# Patient Record
Sex: Female | Born: 1965
Health system: Southern US, Community
[De-identification: ages and names within clinical notes are randomized; demographics above are authoritative.]

## PROBLEM LIST (undated history)

## (undated) ENCOUNTER — Ambulatory Visit (HOSPITAL_COMMUNITY): Payer: 59

## (undated) DIAGNOSIS — R739 Hyperglycemia, unspecified: Secondary | ICD-10-CM

## (undated) DIAGNOSIS — K7460 Unspecified cirrhosis of liver: Secondary | ICD-10-CM

## (undated) DIAGNOSIS — I10 Essential (primary) hypertension: Secondary | ICD-10-CM

## (undated) DIAGNOSIS — R42 Dizziness and giddiness: Secondary | ICD-10-CM

## (undated) DIAGNOSIS — G629 Polyneuropathy, unspecified: Secondary | ICD-10-CM

## (undated) DIAGNOSIS — Z828 Family history of other disabilities and chronic diseases leading to disablement, not elsewhere classified: Secondary | ICD-10-CM

## (undated) DIAGNOSIS — N63 Unspecified lump in unspecified breast: Secondary | ICD-10-CM

## (undated) DIAGNOSIS — M1990 Unspecified osteoarthritis, unspecified site: Secondary | ICD-10-CM

## (undated) DIAGNOSIS — F32A Depression, unspecified: Secondary | ICD-10-CM

## (undated) DIAGNOSIS — F410 Panic disorder [episodic paroxysmal anxiety] without agoraphobia: Secondary | ICD-10-CM

## (undated) DIAGNOSIS — F319 Bipolar disorder, unspecified: Secondary | ICD-10-CM

## (undated) DIAGNOSIS — Z8489 Family history of other specified conditions: Secondary | ICD-10-CM

## (undated) DIAGNOSIS — E7800 Pure hypercholesterolemia, unspecified: Secondary | ICD-10-CM

## (undated) DIAGNOSIS — R918 Other nonspecific abnormal finding of lung field: Secondary | ICD-10-CM

## (undated) DIAGNOSIS — K754 Autoimmune hepatitis: Secondary | ICD-10-CM

## (undated) DIAGNOSIS — F329 Major depressive disorder, single episode, unspecified: Secondary | ICD-10-CM

## (undated) DIAGNOSIS — F419 Anxiety disorder, unspecified: Secondary | ICD-10-CM

## (undated) DIAGNOSIS — B1910 Unspecified viral hepatitis B without hepatic coma: Secondary | ICD-10-CM

## (undated) DIAGNOSIS — N898 Other specified noninflammatory disorders of vagina: Secondary | ICD-10-CM

## (undated) HISTORY — DX: Dizziness and giddiness: R42

## (undated) HISTORY — PX: TUBAL LIGATION: SHX77

## (undated) HISTORY — DX: Autoimmune hepatitis: K75.4

## (undated) HISTORY — DX: Hyperglycemia, unspecified: R73.9

## (undated) HISTORY — DX: Other specified noninflammatory disorders of vagina: N89.8

## (undated) HISTORY — DX: Unspecified cirrhosis of liver: K74.60

## (undated) HISTORY — DX: Panic disorder (episodic paroxysmal anxiety): F41.0

## (undated) HISTORY — PX: OTHER SURGICAL HISTORY: SHX169

## (undated) HISTORY — PX: LAPAROSCOPY: SHX197

---

## 1997-12-07 ENCOUNTER — Other Ambulatory Visit: Admission: RE | Admit: 1997-12-07 | Discharge: 1997-12-07 | Payer: Self-pay | Admitting: Family Medicine

## 2003-12-05 ENCOUNTER — Emergency Department (HOSPITAL_COMMUNITY): Admission: EM | Admit: 2003-12-05 | Discharge: 2003-12-05 | Payer: Self-pay | Admitting: Emergency Medicine

## 2004-01-17 ENCOUNTER — Emergency Department (HOSPITAL_COMMUNITY): Admission: EM | Admit: 2004-01-17 | Discharge: 2004-01-17 | Payer: Self-pay | Admitting: Emergency Medicine

## 2004-03-13 ENCOUNTER — Emergency Department (HOSPITAL_COMMUNITY): Admission: EM | Admit: 2004-03-13 | Discharge: 2004-03-13 | Payer: Self-pay | Admitting: Family Medicine

## 2004-06-05 ENCOUNTER — Emergency Department (HOSPITAL_COMMUNITY): Admission: EM | Admit: 2004-06-05 | Discharge: 2004-06-05 | Payer: Self-pay | Admitting: *Deleted

## 2005-04-04 ENCOUNTER — Emergency Department (HOSPITAL_COMMUNITY): Admission: EM | Admit: 2005-04-04 | Discharge: 2005-04-04 | Payer: Self-pay | Admitting: Family Medicine

## 2005-06-07 ENCOUNTER — Emergency Department (HOSPITAL_COMMUNITY): Admission: EM | Admit: 2005-06-07 | Discharge: 2005-06-07 | Payer: Self-pay | Admitting: Family Medicine

## 2005-06-20 ENCOUNTER — Emergency Department (HOSPITAL_COMMUNITY): Admission: EM | Admit: 2005-06-20 | Discharge: 2005-06-21 | Payer: Self-pay | Admitting: Emergency Medicine

## 2005-06-22 ENCOUNTER — Ambulatory Visit (HOSPITAL_COMMUNITY): Admission: RE | Admit: 2005-06-22 | Discharge: 2005-06-22 | Payer: Self-pay | Admitting: Emergency Medicine

## 2005-10-07 ENCOUNTER — Emergency Department (HOSPITAL_COMMUNITY): Admission: EM | Admit: 2005-10-07 | Discharge: 2005-10-07 | Payer: Self-pay | Admitting: Emergency Medicine

## 2005-12-18 ENCOUNTER — Emergency Department (HOSPITAL_COMMUNITY): Admission: EM | Admit: 2005-12-18 | Discharge: 2005-12-18 | Payer: Self-pay | Admitting: Emergency Medicine

## 2006-02-19 ENCOUNTER — Emergency Department (HOSPITAL_COMMUNITY): Admission: EM | Admit: 2006-02-19 | Discharge: 2006-02-19 | Payer: Self-pay | Admitting: *Deleted

## 2006-03-15 ENCOUNTER — Emergency Department (HOSPITAL_COMMUNITY): Admission: EM | Admit: 2006-03-15 | Discharge: 2006-03-16 | Payer: Self-pay | Admitting: Emergency Medicine

## 2006-03-19 ENCOUNTER — Inpatient Hospital Stay (HOSPITAL_COMMUNITY): Admission: AD | Admit: 2006-03-19 | Discharge: 2006-03-19 | Payer: Self-pay | Admitting: Obstetrics & Gynecology

## 2007-05-17 ENCOUNTER — Emergency Department (HOSPITAL_COMMUNITY): Admission: EM | Admit: 2007-05-17 | Discharge: 2007-05-17 | Payer: Self-pay | Admitting: Family Medicine

## 2007-08-13 ENCOUNTER — Emergency Department (HOSPITAL_COMMUNITY): Admission: EM | Admit: 2007-08-13 | Discharge: 2007-08-13 | Payer: Self-pay | Admitting: Emergency Medicine

## 2008-02-17 ENCOUNTER — Emergency Department (HOSPITAL_COMMUNITY): Admission: EM | Admit: 2008-02-17 | Discharge: 2008-02-17 | Payer: Self-pay | Admitting: Family Medicine

## 2008-04-30 ENCOUNTER — Inpatient Hospital Stay (HOSPITAL_COMMUNITY): Admission: RE | Admit: 2008-04-30 | Discharge: 2008-04-30 | Payer: Self-pay | Admitting: Gynecology

## 2008-12-01 ENCOUNTER — Emergency Department (HOSPITAL_COMMUNITY): Admission: EM | Admit: 2008-12-01 | Discharge: 2008-12-01 | Payer: Self-pay | Admitting: Emergency Medicine

## 2008-12-22 ENCOUNTER — Emergency Department (HOSPITAL_COMMUNITY): Admission: EM | Admit: 2008-12-22 | Discharge: 2008-12-22 | Payer: Self-pay | Admitting: Emergency Medicine

## 2009-04-21 ENCOUNTER — Inpatient Hospital Stay (HOSPITAL_COMMUNITY): Admission: AD | Admit: 2009-04-21 | Discharge: 2009-04-21 | Payer: Self-pay | Admitting: Family Medicine

## 2009-09-03 ENCOUNTER — Emergency Department (HOSPITAL_COMMUNITY): Admission: EM | Admit: 2009-09-03 | Discharge: 2009-09-03 | Payer: Self-pay | Admitting: Emergency Medicine

## 2009-09-12 ENCOUNTER — Emergency Department (HOSPITAL_COMMUNITY): Admission: EM | Admit: 2009-09-12 | Discharge: 2009-09-12 | Payer: Self-pay | Admitting: Emergency Medicine

## 2009-09-16 ENCOUNTER — Emergency Department (HOSPITAL_COMMUNITY): Admission: EM | Admit: 2009-09-16 | Discharge: 2009-09-16 | Payer: Self-pay | Admitting: Family Medicine

## 2009-09-30 ENCOUNTER — Emergency Department (HOSPITAL_COMMUNITY): Admission: EM | Admit: 2009-09-30 | Discharge: 2009-10-01 | Payer: Self-pay | Admitting: Emergency Medicine

## 2009-10-08 ENCOUNTER — Emergency Department (HOSPITAL_COMMUNITY): Admission: EM | Admit: 2009-10-08 | Discharge: 2009-10-08 | Payer: Self-pay | Admitting: Emergency Medicine

## 2009-10-23 ENCOUNTER — Emergency Department (HOSPITAL_COMMUNITY): Admission: EM | Admit: 2009-10-23 | Discharge: 2009-10-23 | Payer: Self-pay | Admitting: Emergency Medicine

## 2009-11-21 ENCOUNTER — Emergency Department (HOSPITAL_COMMUNITY): Admission: EM | Admit: 2009-11-21 | Discharge: 2009-11-21 | Payer: Self-pay | Admitting: Emergency Medicine

## 2009-11-23 ENCOUNTER — Emergency Department (HOSPITAL_COMMUNITY): Admission: EM | Admit: 2009-11-23 | Discharge: 2009-11-23 | Payer: Self-pay | Admitting: Emergency Medicine

## 2009-11-30 ENCOUNTER — Inpatient Hospital Stay (HOSPITAL_COMMUNITY): Admission: EM | Admit: 2009-11-30 | Discharge: 2009-12-04 | Payer: Self-pay | Admitting: Emergency Medicine

## 2009-12-17 ENCOUNTER — Ambulatory Visit (HOSPITAL_COMMUNITY): Admission: RE | Admit: 2009-12-17 | Discharge: 2009-12-17 | Payer: Self-pay | Admitting: Gastroenterology

## 2009-12-30 ENCOUNTER — Emergency Department (HOSPITAL_COMMUNITY): Admission: EM | Admit: 2009-12-30 | Discharge: 2009-12-30 | Payer: Self-pay | Admitting: Emergency Medicine

## 2010-01-01 ENCOUNTER — Observation Stay (HOSPITAL_COMMUNITY): Admission: EM | Admit: 2010-01-01 | Discharge: 2010-01-02 | Payer: Self-pay | Admitting: Emergency Medicine

## 2010-01-27 ENCOUNTER — Emergency Department (HOSPITAL_COMMUNITY): Admission: EM | Admit: 2010-01-27 | Discharge: 2010-01-27 | Payer: Self-pay | Admitting: Family Medicine

## 2010-02-22 ENCOUNTER — Emergency Department (HOSPITAL_COMMUNITY): Admission: EM | Admit: 2010-02-22 | Discharge: 2010-02-22 | Payer: Self-pay | Admitting: Family Medicine

## 2010-04-03 ENCOUNTER — Emergency Department (HOSPITAL_COMMUNITY): Admission: EM | Admit: 2010-04-03 | Discharge: 2010-04-03 | Payer: Self-pay | Admitting: Emergency Medicine

## 2010-04-16 ENCOUNTER — Emergency Department (HOSPITAL_COMMUNITY): Admission: EM | Admit: 2010-04-16 | Discharge: 2010-04-16 | Payer: Self-pay | Admitting: Family Medicine

## 2010-05-10 ENCOUNTER — Emergency Department (HOSPITAL_COMMUNITY): Admission: EM | Admit: 2010-05-10 | Discharge: 2010-05-10 | Payer: Self-pay | Admitting: Family Medicine

## 2010-07-10 ENCOUNTER — Emergency Department (HOSPITAL_COMMUNITY)
Admission: EM | Admit: 2010-07-10 | Discharge: 2010-07-10 | Payer: Self-pay | Source: Home / Self Care | Admitting: Emergency Medicine

## 2010-08-17 ENCOUNTER — Emergency Department (HOSPITAL_COMMUNITY)
Admission: EM | Admit: 2010-08-17 | Discharge: 2010-08-17 | Payer: Self-pay | Source: Home / Self Care | Admitting: Emergency Medicine

## 2010-08-17 LAB — CBC
HCT: 38 % (ref 36.0–46.0)
Hemoglobin: 12.7 g/dL (ref 12.0–15.0)
MCH: 33.7 pg (ref 26.0–34.0)
MCHC: 33.4 g/dL (ref 30.0–36.0)
MCV: 100.8 fL — ABNORMAL HIGH (ref 78.0–100.0)
Platelets: 363 10*3/uL (ref 150–400)
RBC: 3.77 MIL/uL — ABNORMAL LOW (ref 3.87–5.11)
RDW: 14.5 % (ref 11.5–15.5)
WBC: 14.5 10*3/uL — ABNORMAL HIGH (ref 4.0–10.5)

## 2010-08-17 LAB — WET PREP, GENITAL
Trich, Wet Prep: NONE SEEN
Yeast Wet Prep HPF POC: NONE SEEN

## 2010-08-17 LAB — URINALYSIS, ROUTINE W REFLEX MICROSCOPIC
Bilirubin Urine: NEGATIVE
Hemoglobin, Urine: NEGATIVE
Ketones, ur: NEGATIVE mg/dL
Nitrite: NEGATIVE
Protein, ur: NEGATIVE mg/dL
Specific Gravity, Urine: 1.017 (ref 1.005–1.030)
Urine Glucose, Fasting: 250 mg/dL — AB
Urobilinogen, UA: 1 mg/dL (ref 0.0–1.0)
pH: 5.5 (ref 5.0–8.0)

## 2010-08-17 LAB — DIFFERENTIAL
Basophils Absolute: 0.1 10*3/uL (ref 0.0–0.1)
Basophils Relative: 1 % (ref 0–1)
Eosinophils Absolute: 0.4 10*3/uL (ref 0.0–0.7)
Eosinophils Relative: 3 % (ref 0–5)
Lymphocytes Relative: 23 % (ref 12–46)
Lymphs Abs: 3.3 10*3/uL (ref 0.7–4.0)
Monocytes Absolute: 1.1 10*3/uL — ABNORMAL HIGH (ref 0.1–1.0)
Monocytes Relative: 7 % (ref 3–12)
Neutro Abs: 9.7 10*3/uL — ABNORMAL HIGH (ref 1.7–7.7)
Neutrophils Relative %: 67 % (ref 43–77)

## 2010-08-17 LAB — POCT PREGNANCY, URINE: Preg Test, Ur: NEGATIVE

## 2010-08-18 ENCOUNTER — Emergency Department (HOSPITAL_COMMUNITY)
Admission: EM | Admit: 2010-08-18 | Discharge: 2010-08-18 | Payer: Self-pay | Source: Home / Self Care | Admitting: Emergency Medicine

## 2010-08-18 LAB — RPR: RPR Ser Ql: NONREACTIVE

## 2010-08-19 LAB — GC/CHLAMYDIA PROBE AMP, GENITAL
Chlamydia, DNA Probe: NEGATIVE
GC Probe Amp, Genital: NEGATIVE

## 2010-09-04 ENCOUNTER — Encounter: Payer: Self-pay | Admitting: Family Medicine

## 2010-09-13 ENCOUNTER — Emergency Department (HOSPITAL_COMMUNITY)
Admission: EM | Admit: 2010-09-13 | Discharge: 2010-09-13 | Payer: Self-pay | Source: Home / Self Care | Admitting: Family Medicine

## 2010-09-13 ENCOUNTER — Other Ambulatory Visit: Payer: Self-pay | Admitting: Family Medicine

## 2010-09-13 DIAGNOSIS — N63 Unspecified lump in unspecified breast: Secondary | ICD-10-CM

## 2010-09-13 LAB — POCT I-STAT, CHEM 8
BUN: 11 mg/dL (ref 6–23)
Creatinine, Ser: 0.7 mg/dL (ref 0.4–1.2)
Glucose, Bld: 90 mg/dL (ref 70–99)
Potassium: 3.4 mEq/L — ABNORMAL LOW (ref 3.5–5.1)
Sodium: 140 mEq/L (ref 135–145)
TCO2: 29 mmol/L (ref 0–100)

## 2010-09-13 LAB — POCT URINALYSIS DIPSTICK
Hgb urine dipstick: NEGATIVE
Ketones, ur: NEGATIVE mg/dL
Protein, ur: NEGATIVE mg/dL
Specific Gravity, Urine: 1.02 (ref 1.005–1.030)

## 2010-09-13 LAB — WET PREP, GENITAL
Clue Cells Wet Prep HPF POC: NONE SEEN
Trich, Wet Prep: NONE SEEN

## 2010-09-14 LAB — GC/CHLAMYDIA PROBE AMP, GENITAL
Chlamydia, DNA Probe: NEGATIVE
GC Probe Amp, Genital: NEGATIVE

## 2010-09-20 ENCOUNTER — Ambulatory Visit
Admission: RE | Admit: 2010-09-20 | Discharge: 2010-09-20 | Disposition: A | Discharge status: Home / Self Care | Payer: Medicaid Other | Source: Ambulatory Visit | Attending: Family Medicine | Admitting: Family Medicine

## 2010-09-20 ENCOUNTER — Other Ambulatory Visit: Payer: Self-pay | Admitting: Family Medicine

## 2010-09-20 DIAGNOSIS — N63 Unspecified lump in unspecified breast: Secondary | ICD-10-CM

## 2010-10-25 LAB — COMPREHENSIVE METABOLIC PANEL
ALT: 525 U/L — ABNORMAL HIGH (ref 0–35)
AST: 726 U/L — ABNORMAL HIGH (ref 0–37)
CO2: 27 mEq/L (ref 19–32)
Calcium: 9.5 mg/dL (ref 8.4–10.5)
Chloride: 104 mEq/L (ref 96–112)
GFR calc Af Amer: >60 mL/min (ref >60)
GFR calc non Af Amer: >60 mL/min (ref >60)
Potassium: 4.6 mEq/L (ref 3.5–5.1)
Sodium: 139 mEq/L (ref 135–145)
Total Bilirubin: 0.8 mg/dL (ref 0.3–1.2)

## 2010-10-25 LAB — LIPASE, BLOOD: Lipase: 29 U/L (ref 11–59)

## 2010-10-25 LAB — WET PREP, GENITAL
Clue Cells Wet Prep HPF POC: NONE SEEN
WBC, Wet Prep HPF POC: NONE SEEN

## 2010-10-25 LAB — DIFFERENTIAL
Eosinophils Absolute: 0.2 10*3/uL (ref 0.0–0.7)
Eosinophils Relative: 1 % (ref 0–5)
Lymphs Abs: 2.2 10*3/uL (ref 0.7–4.0)

## 2010-10-25 LAB — URINE MICROSCOPIC-ADD ON

## 2010-10-25 LAB — URINALYSIS, ROUTINE W REFLEX MICROSCOPIC
Glucose, UA: NEGATIVE mg/dL
Hgb urine dipstick: NEGATIVE
Specific Gravity, Urine: 1.018 (ref 1.005–1.030)

## 2010-10-25 LAB — GC/CHLAMYDIA PROBE AMP, GENITAL: GC Probe Amp, Genital: NEGATIVE

## 2010-10-25 LAB — CBC
Hemoglobin: 12.7 g/dL (ref 12.0–15.0)
RBC: 3.88 MIL/uL (ref 3.87–5.11)
WBC: 11.8 10*3/uL — ABNORMAL HIGH (ref 4.0–10.5)

## 2010-10-27 LAB — URINE CULTURE

## 2010-10-27 LAB — BASIC METABOLIC PANEL
BUN: 9 mg/dL (ref 6–23)
CO2: 24 mEq/L (ref 19–32)
Calcium: 8.7 mg/dL (ref 8.4–10.5)
Creatinine, Ser: 0.55 mg/dL (ref 0.4–1.2)
Glucose, Bld: 82 mg/dL (ref 70–99)

## 2010-10-27 LAB — DIFFERENTIAL
Basophils Relative: 0 % (ref 0–1)
Eosinophils Absolute: 0.1 10*3/uL (ref 0.0–0.7)
Monocytes Relative: 7 % (ref 3–12)
Neutrophils Relative %: 70 % (ref 43–77)

## 2010-10-27 LAB — WET PREP, GENITAL
Trich, Wet Prep: NONE SEEN
Trich, Wet Prep: NONE SEEN
WBC, Wet Prep HPF POC: NONE SEEN
Yeast Wet Prep HPF POC: NONE SEEN
Yeast Wet Prep HPF POC: NONE SEEN

## 2010-10-27 LAB — LIPASE, BLOOD: Lipase: 25 U/L (ref 11–59)

## 2010-10-27 LAB — POCT URINALYSIS DIPSTICK: pH: 6 (ref 5.0–8.0)

## 2010-10-27 LAB — URINALYSIS, ROUTINE W REFLEX MICROSCOPIC
Ketones, ur: NEGATIVE mg/dL
Nitrite: NEGATIVE
Protein, ur: NEGATIVE mg/dL
pH: 6 (ref 5.0–8.0)

## 2010-10-27 LAB — HEPATIC FUNCTION PANEL
Albumin: 3.3 g/dL — ABNORMAL LOW (ref 3.5–5.2)
Total Bilirubin: 0.7 mg/dL (ref 0.3–1.2)

## 2010-10-27 LAB — CBC
Hemoglobin: 12.6 g/dL (ref 12.0–15.0)
MCH: 29.4 pg (ref 26.0–34.0)
MCHC: 33.3 g/dL (ref 30.0–36.0)
Platelets: 421 10*3/uL — ABNORMAL HIGH (ref 150–400)
RDW: 18.7 % — ABNORMAL HIGH (ref 11.5–15.5)

## 2010-10-27 LAB — POCT PREGNANCY, URINE: Preg Test, Ur: NEGATIVE

## 2010-10-27 LAB — GC/CHLAMYDIA PROBE AMP, GENITAL: GC Probe Amp, Genital: NEGATIVE

## 2010-10-30 LAB — URINALYSIS, ROUTINE W REFLEX MICROSCOPIC
Ketones, ur: NEGATIVE mg/dL
Nitrite: NEGATIVE
Protein, ur: NEGATIVE mg/dL
Urobilinogen, UA: 1 mg/dL (ref 0.0–1.0)

## 2010-10-30 LAB — COMPREHENSIVE METABOLIC PANEL
ALT: 438 U/L — ABNORMAL HIGH (ref 0–35)
AST: 388 U/L — ABNORMAL HIGH (ref 0–37)
Calcium: 9.2 mg/dL (ref 8.4–10.5)
Creatinine, Ser: 0.67 mg/dL (ref 0.4–1.2)
GFR calc Af Amer: >60 mL/min (ref >60)
Sodium: 142 mEq/L (ref 135–145)
Total Protein: 8.2 g/dL (ref 6.0–8.3)

## 2010-10-30 LAB — POCT I-STAT, CHEM 8
Calcium, Ion: 1.17 mmol/L (ref 1.12–1.32)
Chloride: 106 mEq/L (ref 96–112)
HCT: 39 % (ref 36.0–46.0)
Hemoglobin: 13.3 g/dL (ref 12.0–15.0)
TCO2: 24 mmol/L (ref 0–100)

## 2010-10-30 LAB — WET PREP, GENITAL
Trich, Wet Prep: NONE SEEN
Yeast Wet Prep HPF POC: NONE SEEN

## 2010-10-30 LAB — DIFFERENTIAL
Eosinophils Absolute: 0.3 10*3/uL (ref 0.0–0.7)
Lymphocytes Relative: 28 % (ref 12–46)
Lymphs Abs: 2.3 10*3/uL (ref 0.7–4.0)
Monocytes Relative: 8 % (ref 3–12)
Neutrophils Relative %: 60 % (ref 43–77)

## 2010-10-30 LAB — GC/CHLAMYDIA PROBE AMP, GENITAL: GC Probe Amp, Genital: NEGATIVE

## 2010-10-30 LAB — CBC
MCHC: 33.5 g/dL (ref 30.0–36.0)
RDW: 19.6 % — ABNORMAL HIGH (ref 11.5–15.5)

## 2010-10-30 LAB — GLUCOSE, CAPILLARY: Glucose-Capillary: 145 mg/dL — ABNORMAL HIGH (ref 70–99)

## 2010-10-31 LAB — URINALYSIS, ROUTINE W REFLEX MICROSCOPIC
Glucose, UA: >1000 mg/dL — AB
Ketones, ur: NEGATIVE mg/dL
Leukocytes, UA: NEGATIVE
pH: 6.5 (ref 5.0–8.0)

## 2010-10-31 LAB — DIFFERENTIAL
Basophils Absolute: 0 10*3/uL (ref 0.0–0.1)
Eosinophils Relative: 0 % (ref 0–5)
Lymphocytes Relative: 12 % (ref 12–46)
Monocytes Absolute: 1.2 10*3/uL — ABNORMAL HIGH (ref 0.1–1.0)
Monocytes Relative: 7 % (ref 3–12)
Neutro Abs: 15.1 10*3/uL — ABNORMAL HIGH (ref 1.7–7.7)

## 2010-10-31 LAB — COMPREHENSIVE METABOLIC PANEL
ALT: 357 U/L — ABNORMAL HIGH (ref 0–35)
AST: 174 U/L — ABNORMAL HIGH (ref 0–37)
Albumin: 3 g/dL — ABNORMAL LOW (ref 3.5–5.2)
Alkaline Phosphatase: 88 U/L (ref 39–117)
BUN: 11 mg/dL (ref 6–23)
Calcium: 8.6 mg/dL (ref 8.4–10.5)
Calcium: 9.5 mg/dL (ref 8.4–10.5)
Creatinine, Ser: 0.43 mg/dL (ref 0.4–1.2)
GFR calc Af Amer: >60 mL/min (ref >60)
Glucose, Bld: 197 mg/dL — ABNORMAL HIGH (ref 70–99)
Glucose, Bld: 420 mg/dL — ABNORMAL HIGH (ref 70–99)
Potassium: 4.9 mEq/L (ref 3.5–5.1)
Sodium: 136 mEq/L (ref 135–145)
Total Protein: 6.3 g/dL (ref 6.0–8.3)
Total Protein: 7.6 g/dL (ref 6.0–8.3)

## 2010-10-31 LAB — CBC
HCT: 30.8 % — ABNORMAL LOW (ref 36.0–46.0)
HCT: 34.9 % — ABNORMAL LOW (ref 36.0–46.0)
Hemoglobin: 10.5 g/dL — ABNORMAL LOW (ref 12.0–15.0)
Hemoglobin: 12.1 g/dL (ref 12.0–15.0)
MCHC: 34 g/dL (ref 30.0–36.0)
RBC: 3.82 MIL/uL — ABNORMAL LOW (ref 3.87–5.11)
RDW: 17.4 % — ABNORMAL HIGH (ref 11.5–15.5)
RDW: 17.5 % — ABNORMAL HIGH (ref 11.5–15.5)

## 2010-10-31 LAB — HEMOGLOBIN A1C: Hgb A1c MFr Bld: 6.4 % — ABNORMAL HIGH (ref <5.7)

## 2010-10-31 LAB — POCT I-STAT, CHEM 8
Calcium, Ion: 1.18 mmol/L (ref 1.12–1.32)
Glucose, Bld: 530 mg/dL — ABNORMAL HIGH (ref 70–99)
HCT: 39 % (ref 36.0–46.0)
Hemoglobin: 13.3 g/dL (ref 12.0–15.0)
TCO2: 26 mmol/L (ref 0–100)

## 2010-10-31 LAB — POCT URINALYSIS DIP (DEVICE)
Protein, ur: NEGATIVE mg/dL
Specific Gravity, Urine: >=1.03 (ref 1.005–1.030)
Urobilinogen, UA: 4 mg/dL — ABNORMAL HIGH (ref 0.0–1.0)
pH: 5.5 (ref 5.0–8.0)

## 2010-10-31 LAB — GLUCOSE, CAPILLARY
Glucose-Capillary: 265 mg/dL — ABNORMAL HIGH (ref 70–99)
Glucose-Capillary: 277 mg/dL — ABNORMAL HIGH (ref 70–99)
Glucose-Capillary: 438 mg/dL — ABNORMAL HIGH (ref 70–99)
Glucose-Capillary: 453 mg/dL — ABNORMAL HIGH (ref 70–99)
Glucose-Capillary: 528 mg/dL — ABNORMAL HIGH (ref 70–99)

## 2010-10-31 LAB — POCT CARDIAC MARKERS: Myoglobin, poc: 57.3 ng/mL (ref 12–200)

## 2010-10-31 LAB — HEPATIC FUNCTION PANEL
ALT: 274 U/L — ABNORMAL HIGH (ref 0–35)
AST: 128 U/L — ABNORMAL HIGH (ref 0–37)
Alkaline Phosphatase: 90 U/L (ref 39–117)
Bilirubin, Direct: 1.6 mg/dL — ABNORMAL HIGH (ref 0.0–0.3)
Indirect Bilirubin: 1.7 mg/dL — ABNORMAL HIGH (ref 0.3–0.9)
Total Bilirubin: 3.3 mg/dL — ABNORMAL HIGH (ref 0.3–1.2)

## 2010-10-31 LAB — POCT I-STAT 3, VENOUS BLOOD GAS (G3P V)
Acid-Base Excess: 2 mmol/L (ref 0.0–2.0)
O2 Saturation: 49 %
pO2, Ven: 27 mmHg — CL (ref 30.0–45.0)

## 2010-10-31 LAB — LIPID PANEL
Cholesterol: 235 mg/dL — ABNORMAL HIGH (ref 0–200)
HDL: 39 mg/dL — ABNORMAL LOW (ref >39)
LDL Cholesterol: 142 mg/dL — ABNORMAL HIGH (ref 0–99)
Total CHOL/HDL Ratio: 6 RATIO
Triglycerides: 268 mg/dL — ABNORMAL HIGH (ref <150)

## 2010-10-31 LAB — WET PREP, GENITAL: Yeast Wet Prep HPF POC: NONE SEEN

## 2010-10-31 LAB — URINE MICROSCOPIC-ADD ON

## 2010-11-01 LAB — CBC
HCT: 36 % (ref 36.0–46.0)
HCT: 31.7 % — ABNORMAL LOW (ref 36.0–46.0)
HCT: 32.7 % — ABNORMAL LOW (ref 36.0–46.0)
HCT: 33.1 % — ABNORMAL LOW (ref 36.0–46.0)
Hemoglobin: 12.2 g/dL (ref 12.0–15.0)
Hemoglobin: 10.8 g/dL — ABNORMAL LOW (ref 12.0–15.0)
Hemoglobin: 11.5 g/dL — ABNORMAL LOW (ref 12.0–15.0)
MCHC: 35.1 g/dL (ref 30.0–36.0)
MCHC: 35.6 g/dL (ref 30.0–36.0)
MCV: 88.8 fL (ref 78.0–100.0)
Platelets: 598 10*3/uL — ABNORMAL HIGH (ref 150–400)
Platelets: 526 10*3/uL — ABNORMAL HIGH (ref 150–400)
RBC: 3.41 MIL/uL — ABNORMAL LOW (ref 3.87–5.11)
RBC: 3.58 MIL/uL — ABNORMAL LOW (ref 3.87–5.11)
RDW: 21.6 % — ABNORMAL HIGH (ref 11.5–15.5)
RDW: 22.3 % — ABNORMAL HIGH (ref 11.5–15.5)
WBC: 15.1 10*3/uL — ABNORMAL HIGH (ref 4.0–10.5)
WBC: 10.5 10*3/uL (ref 4.0–10.5)
WBC: 9.5 10*3/uL (ref 4.0–10.5)
WBC: 9.8 10*3/uL (ref 4.0–10.5)

## 2010-11-01 LAB — DIFFERENTIAL
Blasts: 0 %
Lymphocytes Relative: 20 % (ref 12–46)
Lymphs Abs: 2 10*3/uL (ref 0.7–4.0)
Neutro Abs: 6.2 10*3/uL (ref 1.7–7.7)
Neutrophils Relative %: 62 % (ref 43–77)
Promyelocytes Absolute: 0 %

## 2010-11-01 LAB — PROTEIN ELECTROPH W RFLX QUANT IMMUNOGLOBULINS
Alpha-1-Globulin: 15.1 % — ABNORMAL HIGH (ref 2.9–4.9)
Gamma Globulin: 22.4 % — ABNORMAL HIGH (ref 11.1–18.8)

## 2010-11-01 LAB — COMPREHENSIVE METABOLIC PANEL
ALT: 611 U/L — ABNORMAL HIGH (ref 0–35)
ALT: 764 U/L — ABNORMAL HIGH (ref 0–35)
Alkaline Phosphatase: 75 U/L (ref 39–117)
Alkaline Phosphatase: 95 U/L (ref 39–117)
BUN: 1 mg/dL — ABNORMAL LOW (ref 6–23)
BUN: 2 mg/dL — ABNORMAL LOW (ref 6–23)
BUN: 4 mg/dL — ABNORMAL LOW (ref 6–23)
CO2: 23 mEq/L (ref 19–32)
CO2: 26 mEq/L (ref 19–32)
Calcium: 8.5 mg/dL (ref 8.4–10.5)
Calcium: 8.7 mg/dL (ref 8.4–10.5)
Calcium: 8.8 mg/dL (ref 8.4–10.5)
Chloride: 102 mEq/L (ref 96–112)
Chloride: 104 mEq/L (ref 96–112)
Creatinine, Ser: 0.5 mg/dL (ref 0.4–1.2)
GFR calc non Af Amer: >60 mL/min (ref >60)
GFR calc non Af Amer: >60 mL/min (ref >60)
GFR calc non Af Amer: >60 mL/min (ref >60)
GFR calc non Af Amer: >60 mL/min (ref >60)
Glucose, Bld: 151 mg/dL — ABNORMAL HIGH (ref 70–99)
Glucose, Bld: 89 mg/dL (ref 70–99)
Glucose, Bld: 96 mg/dL (ref 70–99)
Glucose, Bld: 99 mg/dL (ref 70–99)
Potassium: 3.7 mEq/L (ref 3.5–5.1)
Potassium: 3.8 mEq/L (ref 3.5–5.1)
Sodium: 133 mEq/L — ABNORMAL LOW (ref 135–145)
Sodium: 134 mEq/L — ABNORMAL LOW (ref 135–145)
Total Bilirubin: 6.7 mg/dL — ABNORMAL HIGH (ref 0.3–1.2)
Total Bilirubin: 7.5 mg/dL — ABNORMAL HIGH (ref 0.3–1.2)
Total Protein: 7 g/dL (ref 6.0–8.3)
Total Protein: 7.4 g/dL (ref 6.0–8.3)

## 2010-11-01 LAB — URINALYSIS, ROUTINE W REFLEX MICROSCOPIC
Glucose, UA: NEGATIVE mg/dL
Ketones, ur: NEGATIVE mg/dL
Protein, ur: NEGATIVE mg/dL

## 2010-11-01 LAB — HEPATIC FUNCTION PANEL
ALT: 610 U/L — ABNORMAL HIGH (ref 0–35)
AST: 868 U/L — ABNORMAL HIGH (ref 0–37)
Alkaline Phosphatase: 71 U/L (ref 39–117)
Bilirubin, Direct: 5.1 mg/dL — ABNORMAL HIGH (ref 0.0–0.3)
Indirect Bilirubin: 2.9 mg/dL — ABNORMAL HIGH (ref 0.3–0.9)
Total Protein: 6.7 g/dL (ref 6.0–8.3)

## 2010-11-01 LAB — PROTIME-INR
INR: 0.98 (ref 0.00–1.49)
Prothrombin Time: 12.8 seconds (ref 11.6–15.2)

## 2010-11-01 LAB — URINE MICROSCOPIC-ADD ON

## 2010-11-01 LAB — HEPATITIS B CORE ANTIBODY, TOTAL: Hep B Core Total Ab: NEGATIVE

## 2010-11-01 LAB — ALPHA-1-ANTITRYPSIN: A-1 Antitrypsin, Ser: 223 mg/dL — ABNORMAL HIGH (ref 83–200)

## 2010-11-01 LAB — CYTOMEGALOVIRUS PCR, QUALITATIVE

## 2010-11-01 LAB — APTT
aPTT: 35 seconds (ref 24–37)
aPTT: 34 seconds (ref 24–37)

## 2010-11-01 LAB — IGG, IGA, IGM: IgG (Immunoglobin G), Serum: 1870 mg/dL — ABNORMAL HIGH (ref 694–1618)

## 2010-11-01 LAB — LIPASE, BLOOD: Lipase: 40 U/L (ref 11–59)

## 2010-11-01 LAB — ANTI-SMOOTH MUSCLE ANTIBODY, IGG: F-Actin IgG: <20 U (ref <20)

## 2010-11-01 LAB — CMV ANTIBODY, IGG (EIA): CMV Ab - IgG: 7.6 IU/mL — ABNORMAL HIGH (ref <0.4)

## 2010-11-01 LAB — HCG, SERUM, QUALITATIVE: Preg, Serum: NEGATIVE

## 2010-11-01 LAB — CMV IGM: CMV IgM: <8 AU/mL (ref <30.0)

## 2010-11-01 LAB — HEPATITIS A ANTIBODY, TOTAL: Hep A Total Ab: NEGATIVE

## 2010-11-01 LAB — HEPATITIS B SURFACE ANTIGEN: Hepatitis B Surface Ag: NEGATIVE

## 2010-11-01 LAB — AMMONIA: Ammonia: 51 umol/L — ABNORMAL HIGH (ref 11–35)

## 2010-11-02 LAB — CBC
HCT: 32.6 % — ABNORMAL LOW (ref 36.0–46.0)
Hemoglobin: 11.1 g/dL — ABNORMAL LOW (ref 12.0–15.0)
MCHC: 34 g/dL (ref 30.0–36.0)
Platelets: 482 10*3/uL — ABNORMAL HIGH (ref 150–400)
RDW: 19.2 % — ABNORMAL HIGH (ref 11.5–15.5)

## 2010-11-02 LAB — COMPREHENSIVE METABOLIC PANEL
Albumin: 3.4 g/dL — ABNORMAL LOW (ref 3.5–5.2)
Alkaline Phosphatase: 41 U/L (ref 39–117)
BUN: 15 mg/dL (ref 6–23)
Calcium: 8.8 mg/dL (ref 8.4–10.5)
Glucose, Bld: 88 mg/dL (ref 70–99)
Potassium: 3.8 mEq/L (ref 3.5–5.1)
Total Protein: 7.6 g/dL (ref 6.0–8.3)

## 2010-11-02 LAB — DIFFERENTIAL
Basophils Relative: 0 % (ref 0–1)
Lymphocytes Relative: 21 % (ref 12–46)
Lymphs Abs: 2.4 10*3/uL (ref 0.7–4.0)
Monocytes Absolute: 1.2 10*3/uL — ABNORMAL HIGH (ref 0.1–1.0)
Monocytes Relative: 10 % (ref 3–12)
Neutro Abs: 7.4 10*3/uL (ref 1.7–7.7)
Neutrophils Relative %: 65 % (ref 43–77)

## 2010-11-02 LAB — SAMPLE TO BLOOD BANK

## 2010-11-02 LAB — POCT CARDIAC MARKERS: Myoglobin, poc: 46.6 ng/mL (ref 12–200)

## 2010-11-02 LAB — PROTIME-INR
INR: 1.06 (ref 0.00–1.49)
Prothrombin Time: 13.7 seconds (ref 11.6–15.2)

## 2010-11-06 LAB — POCT I-STAT, CHEM 8
BUN: 6 mg/dL (ref 6–23)
Calcium, Ion: 1.17 mmol/L (ref 1.12–1.32)
Creatinine, Ser: 0.6 mg/dL (ref 0.4–1.2)
Glucose, Bld: 121 mg/dL — ABNORMAL HIGH (ref 70–99)
Hemoglobin: 13.3 g/dL (ref 12.0–15.0)
Sodium: 138 mEq/L (ref 135–145)
TCO2: 28 mmol/L (ref 0–100)

## 2010-11-11 ENCOUNTER — Other Ambulatory Visit: Payer: Self-pay | Admitting: Gastroenterology

## 2010-11-17 ENCOUNTER — Ambulatory Visit
Admission: RE | Admit: 2010-11-17 | Discharge: 2010-11-17 | Disposition: A | Discharge status: Home / Self Care | Payer: Medicaid Other | Source: Ambulatory Visit | Attending: Gastroenterology | Admitting: Gastroenterology

## 2010-11-17 MED ORDER — IOHEXOL 300 MG/ML  SOLN
125.0000 mL | Freq: Once | INTRAMUSCULAR | Status: AC | PRN
  Administered 2010-11-17: 125 mL via INTRAVENOUS

## 2010-11-18 LAB — URINALYSIS, ROUTINE W REFLEX MICROSCOPIC
Bilirubin Urine: NEGATIVE
Glucose, UA: NEGATIVE mg/dL
Hgb urine dipstick: NEGATIVE
Protein, ur: NEGATIVE mg/dL
Urobilinogen, UA: 0.2 mg/dL (ref 0.0–1.0)

## 2010-11-18 LAB — WET PREP, GENITAL: Yeast Wet Prep HPF POC: NONE SEEN

## 2010-11-18 LAB — POCT PREGNANCY, URINE: Preg Test, Ur: NEGATIVE

## 2010-11-18 LAB — GC/CHLAMYDIA PROBE AMP, GENITAL: GC Probe Amp, Genital: NEGATIVE

## 2010-12-02 ENCOUNTER — Emergency Department (HOSPITAL_COMMUNITY)
Admission: EM | Admit: 2010-12-02 | Discharge: 2010-12-02 | Disposition: A | Discharge status: Home / Self Care | Payer: Medicaid Other | Attending: Emergency Medicine | Admitting: Emergency Medicine

## 2010-12-02 DIAGNOSIS — R1013 Epigastric pain: Secondary | ICD-10-CM | POA: Insufficient documentation

## 2010-12-02 DIAGNOSIS — F329 Major depressive disorder, single episode, unspecified: Secondary | ICD-10-CM | POA: Insufficient documentation

## 2010-12-02 DIAGNOSIS — B3731 Acute candidiasis of vulva and vagina: Secondary | ICD-10-CM | POA: Insufficient documentation

## 2010-12-02 DIAGNOSIS — R3589 Other polyuria: Secondary | ICD-10-CM | POA: Insufficient documentation

## 2010-12-02 DIAGNOSIS — K754 Autoimmune hepatitis: Secondary | ICD-10-CM | POA: Insufficient documentation

## 2010-12-02 DIAGNOSIS — E119 Type 2 diabetes mellitus without complications: Secondary | ICD-10-CM | POA: Insufficient documentation

## 2010-12-02 DIAGNOSIS — B373 Candidiasis of vulva and vagina: Secondary | ICD-10-CM | POA: Insufficient documentation

## 2010-12-02 DIAGNOSIS — B199 Unspecified viral hepatitis without hepatic coma: Secondary | ICD-10-CM | POA: Insufficient documentation

## 2010-12-02 DIAGNOSIS — R631 Polydipsia: Secondary | ICD-10-CM | POA: Insufficient documentation

## 2010-12-02 DIAGNOSIS — Z794 Long term (current) use of insulin: Secondary | ICD-10-CM | POA: Insufficient documentation

## 2010-12-02 DIAGNOSIS — R358 Other polyuria: Secondary | ICD-10-CM | POA: Insufficient documentation

## 2010-12-02 DIAGNOSIS — R220 Localized swelling, mass and lump, head: Secondary | ICD-10-CM | POA: Insufficient documentation

## 2010-12-02 DIAGNOSIS — B370 Candidal stomatitis: Secondary | ICD-10-CM | POA: Insufficient documentation

## 2010-12-02 DIAGNOSIS — I10 Essential (primary) hypertension: Secondary | ICD-10-CM | POA: Insufficient documentation

## 2010-12-02 DIAGNOSIS — R350 Frequency of micturition: Secondary | ICD-10-CM | POA: Insufficient documentation

## 2010-12-02 DIAGNOSIS — Z79899 Other long term (current) drug therapy: Secondary | ICD-10-CM | POA: Insufficient documentation

## 2010-12-02 DIAGNOSIS — E7800 Pure hypercholesterolemia, unspecified: Secondary | ICD-10-CM | POA: Insufficient documentation

## 2010-12-02 DIAGNOSIS — K7460 Unspecified cirrhosis of liver: Secondary | ICD-10-CM | POA: Insufficient documentation

## 2010-12-02 DIAGNOSIS — F3289 Other specified depressive episodes: Secondary | ICD-10-CM | POA: Insufficient documentation

## 2010-12-02 DIAGNOSIS — R000 Tachycardia, unspecified: Secondary | ICD-10-CM | POA: Insufficient documentation

## 2010-12-02 DIAGNOSIS — J45909 Unspecified asthma, uncomplicated: Secondary | ICD-10-CM | POA: Insufficient documentation

## 2010-12-02 LAB — COMPREHENSIVE METABOLIC PANEL
Alkaline Phosphatase: 59 U/L (ref 39–117)
BUN: 12 mg/dL (ref 6–23)
CO2: 29 mEq/L (ref 19–32)
GFR calc non Af Amer: >60 mL/min (ref >60)
Glucose, Bld: 572 mg/dL (ref 70–99)
Potassium: 4.2 mEq/L (ref 3.5–5.1)
Total Bilirubin: 0.7 mg/dL (ref 0.3–1.2)
Total Protein: 8.5 g/dL — ABNORMAL HIGH (ref 6.0–8.3)

## 2010-12-02 LAB — LIPASE, BLOOD: Lipase: 31 U/L (ref 11–59)

## 2010-12-02 LAB — URINALYSIS, ROUTINE W REFLEX MICROSCOPIC
Ketones, ur: NEGATIVE mg/dL
Leukocytes, UA: NEGATIVE
Nitrite: NEGATIVE
Specific Gravity, Urine: 1.038 — ABNORMAL HIGH (ref 1.005–1.030)
pH: 5.5 (ref 5.0–8.0)

## 2010-12-02 LAB — CBC
MCH: 33.8 pg (ref 26.0–34.0)
Platelets: 455 10*3/uL — ABNORMAL HIGH (ref 150–400)
RBC: 4.32 MIL/uL (ref 3.87–5.11)
WBC: 16.1 10*3/uL — ABNORMAL HIGH (ref 4.0–10.5)

## 2010-12-02 LAB — GLUCOSE, CAPILLARY
Glucose-Capillary: 561 mg/dL (ref 70–99)
Glucose-Capillary: 392 mg/dL — ABNORMAL HIGH (ref 70–99)

## 2010-12-02 LAB — DIFFERENTIAL
Basophils Relative: 0 % (ref 0–1)
Eosinophils Absolute: 0.1 10*3/uL (ref 0.0–0.7)
Neutrophils Relative %: 77 % (ref 43–77)

## 2010-12-02 LAB — URINE MICROSCOPIC-ADD ON

## 2010-12-12 ENCOUNTER — Inpatient Hospital Stay (INDEPENDENT_AMBULATORY_CARE_PROVIDER_SITE_OTHER)
Admission: RE | Admit: 2010-12-12 | Discharge: 2010-12-12 | Disposition: A | Discharge status: Home / Self Care | Payer: Medicaid Other | Source: Ambulatory Visit | Attending: Family Medicine | Admitting: Family Medicine

## 2010-12-12 DIAGNOSIS — E119 Type 2 diabetes mellitus without complications: Secondary | ICD-10-CM

## 2010-12-12 DIAGNOSIS — R7989 Other specified abnormal findings of blood chemistry: Secondary | ICD-10-CM

## 2010-12-12 LAB — POCT URINALYSIS DIP (DEVICE)
Bilirubin Urine: NEGATIVE
Nitrite: NEGATIVE
Protein, ur: NEGATIVE mg/dL
Urobilinogen, UA: 0.2 mg/dL (ref 0.0–1.0)
pH: 5.5 (ref 5.0–8.0)

## 2010-12-17 ENCOUNTER — Observation Stay (HOSPITAL_COMMUNITY)
Admission: EM | Admit: 2010-12-17 | Discharge: 2010-12-19 | Disposition: A | Discharge status: Home / Self Care | Payer: Medicaid Other | Attending: Internal Medicine | Admitting: Internal Medicine

## 2010-12-17 DIAGNOSIS — IMO0002 Reserved for concepts with insufficient information to code with codable children: Secondary | ICD-10-CM | POA: Insufficient documentation

## 2010-12-17 DIAGNOSIS — F329 Major depressive disorder, single episode, unspecified: Secondary | ICD-10-CM | POA: Insufficient documentation

## 2010-12-17 DIAGNOSIS — F3289 Other specified depressive episodes: Secondary | ICD-10-CM | POA: Insufficient documentation

## 2010-12-17 DIAGNOSIS — K754 Autoimmune hepatitis: Secondary | ICD-10-CM | POA: Insufficient documentation

## 2010-12-17 DIAGNOSIS — I10 Essential (primary) hypertension: Secondary | ICD-10-CM | POA: Insufficient documentation

## 2010-12-17 DIAGNOSIS — IMO0001 Reserved for inherently not codable concepts without codable children: Principal | ICD-10-CM | POA: Insufficient documentation

## 2010-12-17 DIAGNOSIS — J45909 Unspecified asthma, uncomplicated: Secondary | ICD-10-CM | POA: Insufficient documentation

## 2010-12-17 DIAGNOSIS — E785 Hyperlipidemia, unspecified: Secondary | ICD-10-CM | POA: Insufficient documentation

## 2010-12-17 LAB — DIFFERENTIAL
Eosinophils Relative: 2 % (ref 0–5)
Lymphocytes Relative: 26 % (ref 12–46)
Lymphs Abs: 3 10*3/uL (ref 0.7–4.0)
Monocytes Absolute: 0.9 10*3/uL (ref 0.1–1.0)
Monocytes Relative: 8 % (ref 3–12)
Neutro Abs: 7.3 10*3/uL (ref 1.7–7.7)

## 2010-12-17 LAB — URINE MICROSCOPIC-ADD ON

## 2010-12-17 LAB — URINALYSIS, ROUTINE W REFLEX MICROSCOPIC
Bilirubin Urine: NEGATIVE
Hgb urine dipstick: NEGATIVE
Ketones, ur: NEGATIVE mg/dL
Nitrite: NEGATIVE
pH: 5.5 (ref 5.0–8.0)

## 2010-12-17 LAB — BASIC METABOLIC PANEL
BUN: 10 mg/dL (ref 6–23)
CO2: 26 mEq/L (ref 19–32)
Calcium: 9.3 mg/dL (ref 8.4–10.5)
GFR calc non Af Amer: >60 mL/min (ref >60)
Glucose, Bld: 435 mg/dL — ABNORMAL HIGH (ref 70–99)
Potassium: 3.7 mEq/L (ref 3.5–5.1)

## 2010-12-17 LAB — CBC
HCT: 39 % (ref 36.0–46.0)
Hemoglobin: 13.7 g/dL (ref 12.0–15.0)
MCHC: 35.1 g/dL (ref 30.0–36.0)
MCV: 93.8 fL (ref 78.0–100.0)
RDW: 13 % (ref 11.5–15.5)

## 2010-12-17 LAB — GLUCOSE, CAPILLARY
Glucose-Capillary: 272 mg/dL — ABNORMAL HIGH (ref 70–99)
Glucose-Capillary: 349 mg/dL — ABNORMAL HIGH (ref 70–99)

## 2010-12-17 LAB — HEMOGLOBIN A1C
Hgb A1c MFr Bld: 12.3 % — ABNORMAL HIGH (ref <5.7)
Mean Plasma Glucose: 306 mg/dL — ABNORMAL HIGH (ref <117)

## 2010-12-18 LAB — CBC
HCT: 37 % (ref 36.0–46.0)
Hemoglobin: 12.7 g/dL (ref 12.0–15.0)
RDW: 12.8 % (ref 11.5–15.5)
WBC: 13.8 10*3/uL — ABNORMAL HIGH (ref 4.0–10.5)

## 2010-12-18 LAB — BASIC METABOLIC PANEL
Glucose, Bld: 209 mg/dL — ABNORMAL HIGH (ref 70–99)
Potassium: 4.1 mEq/L (ref 3.5–5.1)
Sodium: 133 mEq/L — ABNORMAL LOW (ref 135–145)

## 2010-12-18 LAB — GLUCOSE, CAPILLARY
Glucose-Capillary: 229 mg/dL — ABNORMAL HIGH (ref 70–99)
Glucose-Capillary: 253 mg/dL — ABNORMAL HIGH (ref 70–99)

## 2010-12-19 LAB — GLUCOSE, CAPILLARY: Glucose-Capillary: 171 mg/dL — ABNORMAL HIGH (ref 70–99)

## 2010-12-20 NOTE — H&P (Signed)
NAMEMarland Young  Hannah, SAWAYA               ACCOUNT NO.:  [id number]  MEDICAL RECORD NO.:  [id number]           PATIENT TYPE:  O  LOCATION:  5527                         FACILITY:  [facility]  PHYSICIAN:  Lonia Blood, M.D.       DATE OF BIRTH:  [birth year]  DATE OF ADMISSION:  12/17/2010 DATE OF DISCHARGE:                             HISTORY & PHYSICAL   PRIMARY CARE PHYSICIAN:  [facility].  CHIEF COMPLAINT:  Urinating a lot, getting dizzy and weak.  HISTORY OF PRESENT ILLNESS:  Ms. Young is a 45 year old woman who was diagnosed last year with presumed autoimmune hepatitis and she was placed on prednisone, which she has been taking it every day since then. She presented for the fourth time in the past week to the emergency room with uncontrolled diabetes with sugars about 400, thirsty and dizzy. Despite the emergency room physicians attempts to control her diabetes, she keeps coming back and she has no other primary care provider.  She has been unable to establish care for her diabetes and the emergency room physician have got him to the point where they feel like they need some help in stabilizing the patient's uncontrolled diabetes and provide her with followup.  Currently, she is denying any pain and focal weakness or numbness or shortness of breath.  PAST MEDICAL HISTORY: 1. Autoimmune hepatitis. 2. Depression. 3. Diabetes mellitus type 2. 4. Hyperlipidemia. 5. Hypertension. 6. Morbid obesity. 7. Asthma.  HOME MEDICATIONS: 1. Metformin. 2. Insulin regular 3 times a day with meals. 3. Prednisone 40 mg daily. 4. 6-mercaptopurine  ALLERGIES:  TYLENOL and VICODIN.  SOCIAL HISTORY:  The patient is a former heavy smoker of 3 packs a day. Currently, she only smokes couple of cigarettes each day.  She used to be heavy drinker in her 35s where she would drink a lot of vodka, but she has been abstinent for 2 years now.  No history of intravenous  drug abuse.  SURGICAL HISTORY:  She had cesarean section x2 and bilateral tubal ligation.  Last menstrual period was on December 10, 2010.  REVIEW OF SYSTEMS:  As per HPI.  All other systems reviewed are negative.  PHYSICAL EXAMINATION:  VITAL SIGNS:  Upon admission, temperature is 98.6, blood pressure 100/50, pulse 86, respiration 17, saturating 100% on room air. GENERAL:  The patient is alert, oriented in no acute distress. HEENT:  Head, normocephalic, atraumatic.  Eyes, pupils equal, round and reactive to light and accommodation.  Extraocular movements are intact. Throat clear. NECK:  Supple.  No JVD. CHEST:  Clear to auscultation without wheezes or crackles. HEART:  Regular rate and rhythm without murmurs, rubs or gallops. ABDOMEN:  Soft, nontender, nondistended.  Bowel sounds are present. EXTREMITIES:  Lower extremities without edema. SKIN:  Warm, dry.  No suspicious looking rashes.  Palpation of the abdomen reveals soft hepatomegaly.  Of note, the patient has multiple piercing of her nose and ears.  LABORATORY DATA:  Laboratory values on admission; white blood cell count is 11.4, hemoglobin 30.7, platelet count 377.  Sodium 132, potassium 3.7, chloride 97, bicarbonate 26, BUN 10, creatinine 0.4,  glucose 435. Urine ketones is negative.  ASSESSMENT AND PLAN: 1. This is a 45 year old woman admitted with uncontrolled diabetes     mellitus type 2 in the setting of steroid use for autoimmune     hepatitis.  As far as I can ascertain, the patient is following a     moderate carbohydrate modified diet and voiding concentrated sweets     and so does in juices.  Since, I do not have a good feeling of why     her diabetes is still uncontrolled, I suggested that we go to     insulin NPH twice a day and start with 20 units twice a day.     Continue the metformin.  Monitoring in the hospital overnight again     to see the response.  We will arrange for followup after she leaves     the  hospital with [facility]. 2. Autoimmune hepatitis.  We will continue her medications without     changes. 3. History of depression.  The patient is in the process of getting     back to the [facility] for outpatient consult.  She is     currently denying any suicidal ideations.     Lonia Blood, M.D.     SL/MEDQ  D:  12/17/2010  T:  12/17/2010  Job:  [id number]  Electronically Signed by Lonia Blood M.D. on 12/20/2010 05:47:00 PM

## 2010-12-20 NOTE — Discharge Summary (Signed)
  [name], [name]               ACCOUNT NO.:  [id number]  MEDICAL RECORD NO.:  [id number]           PATIENT TYPE:  O  LOCATION:  5527                         FACILITY:  [facility]  PHYSICIAN:  Lonia Blood, M.D.       DATE OF BIRTH:  [birth year]  DATE OF ADMISSION:  12/17/2010 DATE OF DISCHARGE:  12/19/2010                              DISCHARGE SUMMARY   PRIMARY CARE PHYSICIAN:  The patient has been referred to the [facility].  DISCHARGE DIAGNOSES: 1. Uncontrolled diabetes mellitus type 2 with polyuria, generalized     weakness, and polydipsia. 2. Autoimmune hepatitis, on chronic steroids. 3. Morbid obesity. 4. Depression. 5. Hyperlipidemia. 6. Hypertension. 7. Chronic asthma.  DISCHARGE MEDICATIONS: 1. Prednisone 40 mg daily. 2. 6-mercaptopurine 50 mg daily. 3. Albuterol 90 mcg 2 puffs 4 times a day as needed for dyspnea. 4. Atarax 25 mg every 8 hours as needed for itching. 5. Benadryl 25 mg every 6 hours as needed for itching. 6. Fish oil over-the-counter daily. 7. Insulin NPH 30 units twice a day. 8. Insulin regular 12 units 3 times a day before meals. 9. Metformin 1000 mg twice a day. 10.Nicotine patch 21 mg daily. 11.Trazodone 50 mg at bedtime as needed for sleep. 12.Triamcinolone 1 inch 3 times a day to the legs.  CONDITION ON DISCHARGE:  Hannah Young was discharged in good condition. She will follow up with [facility] on Dec 26, 2010, for her diabetes.  PROCEDURES:  No procedures done.  CONSULTATION:  No consultations obtained.  HISTORY AND PHYSICAL:  Refer to the H and P done by Dr. Lavera Guise.  HOSPITAL COURSE:  [name] is a 45 year old woman with known autoimmune hepatitis, on chronic steroids who presented to the emergency room with complaints of polyuria, polydipsia, and generalized weakness.  She was found to be again with uncontrolled type 2 diabetes without any ketoacidosis.  Three prior attempts by the emergency room to stabilize her  hyperglycemia have failed.  She was admitted to the hospital where she was initiated on long-acting insulin NPH at 20 units twice a day. On hospital day #2, the symptoms were better, but she was still uncontrolled with CBGs in the 300s.  The insulin NPH was increased to 30 units twice a day.  Hemoglobin A1c resulted back at 12.3 confirming utterly uncontrolled diabetes mellitus type 2.  The patient was referred for outpatient diabetic education.  Followup with the [facility] was arranged and she was educated about up-titration of the insulin NPH to obtain better diabetes control.  Otherwise, [name] provided with the trazodone to help her with insomnia and albuterol as needed to help her with the asthma.  This patient was also counseled about importance of smoking cessation for her tobacco abuse and she was provided with nicotine patches.     Lonia Blood, M.D.     SL/MEDQ  D:  12/19/2010  T:  12/20/2010  Job:  [id number]  Electronically Signed by Lonia Blood M.D. on 12/20/2010 05:47:08 PM

## 2010-12-21 ENCOUNTER — Emergency Department (HOSPITAL_COMMUNITY): Payer: Medicaid Other

## 2010-12-21 ENCOUNTER — Emergency Department (HOSPITAL_COMMUNITY)
Admission: EM | Admit: 2010-12-21 | Discharge: 2010-12-21 | Disposition: A | Discharge status: Home / Self Care | Payer: Medicaid Other | Attending: Emergency Medicine | Admitting: Emergency Medicine

## 2010-12-21 DIAGNOSIS — M79609 Pain in unspecified limb: Secondary | ICD-10-CM | POA: Insufficient documentation

## 2010-12-21 DIAGNOSIS — I10 Essential (primary) hypertension: Secondary | ICD-10-CM | POA: Insufficient documentation

## 2010-12-21 DIAGNOSIS — E119 Type 2 diabetes mellitus without complications: Secondary | ICD-10-CM | POA: Insufficient documentation

## 2010-12-21 DIAGNOSIS — Z794 Long term (current) use of insulin: Secondary | ICD-10-CM | POA: Insufficient documentation

## 2011-01-09 ENCOUNTER — Inpatient Hospital Stay (INDEPENDENT_AMBULATORY_CARE_PROVIDER_SITE_OTHER)
Admission: RE | Admit: 2011-01-09 | Discharge: 2011-01-09 | Disposition: A | Discharge status: Home / Self Care | Payer: Medicaid Other | Source: Ambulatory Visit | Attending: Emergency Medicine | Admitting: Emergency Medicine

## 2011-01-09 DIAGNOSIS — N760 Acute vaginitis: Secondary | ICD-10-CM

## 2011-01-09 DIAGNOSIS — B373 Candidiasis of vulva and vagina: Secondary | ICD-10-CM

## 2011-01-09 DIAGNOSIS — B3731 Acute candidiasis of vulva and vagina: Secondary | ICD-10-CM

## 2011-01-09 DIAGNOSIS — B9689 Other specified bacterial agents as the cause of diseases classified elsewhere: Secondary | ICD-10-CM

## 2011-01-09 DIAGNOSIS — A499 Bacterial infection, unspecified: Secondary | ICD-10-CM

## 2011-01-09 LAB — WET PREP, GENITAL
Trich, Wet Prep: NONE SEEN
Yeast Wet Prep HPF POC: NONE SEEN

## 2011-01-09 LAB — RPR: RPR Ser Ql: NONREACTIVE

## 2011-01-11 LAB — POCT URINALYSIS DIP (DEVICE)
Bilirubin Urine: NEGATIVE
Glucose, UA: 500 mg/dL — AB
Ketones, ur: NEGATIVE mg/dL
Specific Gravity, Urine: <=1.005 (ref 1.005–1.030)
Urobilinogen, UA: 1 mg/dL (ref 0.0–1.0)

## 2011-01-17 ENCOUNTER — Ambulatory Visit: Payer: Medicaid Other | Admitting: *Deleted

## 2011-01-25 ENCOUNTER — Inpatient Hospital Stay (HOSPITAL_COMMUNITY)
Admission: AD | Admit: 2011-01-25 | Discharge: 2011-01-25 | Disposition: A | Discharge status: Home / Self Care | Payer: Medicaid Other | Source: Ambulatory Visit | Attending: Family Medicine | Admitting: Family Medicine

## 2011-01-25 DIAGNOSIS — N949 Unspecified condition associated with female genital organs and menstrual cycle: Secondary | ICD-10-CM | POA: Insufficient documentation

## 2011-01-25 DIAGNOSIS — N760 Acute vaginitis: Secondary | ICD-10-CM | POA: Insufficient documentation

## 2011-01-26 LAB — GC/CHLAMYDIA PROBE AMP, GENITAL
Chlamydia, DNA Probe: NEGATIVE
GC Probe Amp, Genital: NEGATIVE

## 2011-02-14 ENCOUNTER — Emergency Department (HOSPITAL_COMMUNITY)
Admission: EM | Admit: 2011-02-14 | Discharge: 2011-02-14 | Disposition: A | Discharge status: Home / Self Care | Payer: Medicaid Other | Attending: Emergency Medicine | Admitting: Emergency Medicine

## 2011-02-14 DIAGNOSIS — E789 Disorder of lipoprotein metabolism, unspecified: Secondary | ICD-10-CM | POA: Insufficient documentation

## 2011-02-14 DIAGNOSIS — K089 Disorder of teeth and supporting structures, unspecified: Secondary | ICD-10-CM | POA: Insufficient documentation

## 2011-02-14 DIAGNOSIS — Z79899 Other long term (current) drug therapy: Secondary | ICD-10-CM | POA: Insufficient documentation

## 2011-02-14 DIAGNOSIS — F329 Major depressive disorder, single episode, unspecified: Secondary | ICD-10-CM | POA: Insufficient documentation

## 2011-02-14 DIAGNOSIS — K029 Dental caries, unspecified: Secondary | ICD-10-CM | POA: Insufficient documentation

## 2011-02-14 DIAGNOSIS — N760 Acute vaginitis: Secondary | ICD-10-CM | POA: Insufficient documentation

## 2011-02-14 DIAGNOSIS — I10 Essential (primary) hypertension: Secondary | ICD-10-CM | POA: Insufficient documentation

## 2011-02-14 DIAGNOSIS — E119 Type 2 diabetes mellitus without complications: Secondary | ICD-10-CM | POA: Insufficient documentation

## 2011-02-14 DIAGNOSIS — K7460 Unspecified cirrhosis of liver: Secondary | ICD-10-CM | POA: Insufficient documentation

## 2011-02-14 DIAGNOSIS — J45909 Unspecified asthma, uncomplicated: Secondary | ICD-10-CM | POA: Insufficient documentation

## 2011-02-14 DIAGNOSIS — F3289 Other specified depressive episodes: Secondary | ICD-10-CM | POA: Insufficient documentation

## 2011-02-14 LAB — URINALYSIS, ROUTINE W REFLEX MICROSCOPIC
Ketones, ur: NEGATIVE mg/dL
Leukocytes, UA: NEGATIVE
Protein, ur: NEGATIVE mg/dL
Urobilinogen, UA: 1 mg/dL (ref 0.0–1.0)

## 2011-02-14 LAB — POCT PREGNANCY, URINE: Preg Test, Ur: NEGATIVE

## 2011-02-15 LAB — GC/CHLAMYDIA PROBE AMP, GENITAL: GC Probe Amp, Genital: NEGATIVE

## 2011-03-24 ENCOUNTER — Emergency Department (HOSPITAL_COMMUNITY)
Admission: EM | Admit: 2011-03-24 | Discharge: 2011-03-24 | Disposition: A | Discharge status: Home / Self Care | Payer: Medicaid Other | Attending: Emergency Medicine | Admitting: Emergency Medicine

## 2011-03-24 ENCOUNTER — Emergency Department (HOSPITAL_COMMUNITY): Payer: Medicaid Other

## 2011-03-24 DIAGNOSIS — E7800 Pure hypercholesterolemia, unspecified: Secondary | ICD-10-CM | POA: Insufficient documentation

## 2011-03-24 DIAGNOSIS — I10 Essential (primary) hypertension: Secondary | ICD-10-CM | POA: Insufficient documentation

## 2011-03-24 DIAGNOSIS — J45909 Unspecified asthma, uncomplicated: Secondary | ICD-10-CM | POA: Insufficient documentation

## 2011-03-24 DIAGNOSIS — Z794 Long term (current) use of insulin: Secondary | ICD-10-CM | POA: Insufficient documentation

## 2011-03-24 DIAGNOSIS — E119 Type 2 diabetes mellitus without complications: Secondary | ICD-10-CM | POA: Insufficient documentation

## 2011-03-24 DIAGNOSIS — E669 Obesity, unspecified: Secondary | ICD-10-CM | POA: Insufficient documentation

## 2011-03-24 DIAGNOSIS — K7460 Unspecified cirrhosis of liver: Secondary | ICD-10-CM | POA: Insufficient documentation

## 2011-03-24 DIAGNOSIS — N898 Other specified noninflammatory disorders of vagina: Secondary | ICD-10-CM | POA: Insufficient documentation

## 2011-03-24 DIAGNOSIS — N72 Inflammatory disease of cervix uteri: Secondary | ICD-10-CM | POA: Insufficient documentation

## 2011-03-24 DIAGNOSIS — K754 Autoimmune hepatitis: Secondary | ICD-10-CM | POA: Insufficient documentation

## 2011-03-24 DIAGNOSIS — M25519 Pain in unspecified shoulder: Secondary | ICD-10-CM | POA: Insufficient documentation

## 2011-03-24 LAB — URINALYSIS, ROUTINE W REFLEX MICROSCOPIC
Glucose, UA: 250 mg/dL — AB
Hgb urine dipstick: NEGATIVE
Ketones, ur: NEGATIVE mg/dL
Protein, ur: NEGATIVE mg/dL
pH: 8 (ref 5.0–8.0)

## 2011-03-24 LAB — DIFFERENTIAL
Basophils Absolute: 0 10*3/uL (ref 0.0–0.1)
Basophils Relative: 0 % (ref 0–1)
Neutro Abs: 10.3 10*3/uL — ABNORMAL HIGH (ref 1.7–7.7)
Neutrophils Relative %: 67 % (ref 43–77)

## 2011-03-24 LAB — URINE MICROSCOPIC-ADD ON

## 2011-03-24 LAB — CBC
Hemoglobin: 12.2 g/dL (ref 12.0–15.0)
RBC: 3.82 MIL/uL — ABNORMAL LOW (ref 3.87–5.11)
WBC: 15.5 10*3/uL — ABNORMAL HIGH (ref 4.0–10.5)

## 2011-03-24 LAB — RPR: RPR Ser Ql: NONREACTIVE

## 2011-03-24 LAB — POCT I-STAT, CHEM 8
Chloride: 101 mEq/L (ref 96–112)
Glucose, Bld: 80 mg/dL (ref 70–99)
HCT: 38 % (ref 36.0–46.0)
Potassium: 3.5 mEq/L (ref 3.5–5.1)

## 2011-03-24 LAB — WET PREP, GENITAL: Clue Cells Wet Prep HPF POC: NONE SEEN

## 2011-03-24 LAB — POCT I-STAT TROPONIN I: Troponin i, poc: 0.02 ng/mL (ref 0.00–0.08)

## 2011-03-25 LAB — GC/CHLAMYDIA PROBE AMP, GENITAL: GC Probe Amp, Genital: NEGATIVE

## 2011-04-12 ENCOUNTER — Inpatient Hospital Stay (INDEPENDENT_AMBULATORY_CARE_PROVIDER_SITE_OTHER)
Admission: RE | Admit: 2011-04-12 | Discharge: 2011-04-12 | Disposition: A | Discharge status: Home / Self Care | Payer: Medicaid Other | Source: Ambulatory Visit | Attending: Family Medicine | Admitting: Family Medicine

## 2011-04-12 DIAGNOSIS — Z0489 Encounter for examination and observation for other specified reasons: Secondary | ICD-10-CM

## 2011-05-11 LAB — POCT PREGNANCY, URINE
Operator id: 235561
Preg Test, Ur: NEGATIVE

## 2011-05-11 LAB — POCT URINALYSIS DIP (DEVICE)
Nitrite: NEGATIVE
Protein, ur: NEGATIVE
pH: 6

## 2011-05-11 LAB — WET PREP, GENITAL: WBC, Wet Prep HPF POC: NONE SEEN

## 2011-05-15 LAB — URINALYSIS, ROUTINE W REFLEX MICROSCOPIC
Glucose, UA: NEGATIVE
Nitrite: NEGATIVE
Protein, ur: NEGATIVE
pH: 6.5

## 2011-05-15 LAB — RPR: RPR Ser Ql: NONREACTIVE

## 2011-05-15 LAB — WET PREP, GENITAL

## 2011-08-19 ENCOUNTER — Emergency Department (HOSPITAL_COMMUNITY)
Admission: EM | Admit: 2011-08-19 | Discharge: 2011-08-19 | Disposition: A | Discharge status: Home / Self Care | Payer: Medicaid Other | Attending: Emergency Medicine | Admitting: Emergency Medicine

## 2011-08-19 ENCOUNTER — Encounter (HOSPITAL_COMMUNITY): Payer: Self-pay | Admitting: *Deleted

## 2011-08-19 DIAGNOSIS — Z794 Long term (current) use of insulin: Secondary | ICD-10-CM | POA: Insufficient documentation

## 2011-08-19 DIAGNOSIS — R509 Fever, unspecified: Secondary | ICD-10-CM | POA: Insufficient documentation

## 2011-08-19 DIAGNOSIS — R350 Frequency of micturition: Secondary | ICD-10-CM | POA: Insufficient documentation

## 2011-08-19 DIAGNOSIS — E7800 Pure hypercholesterolemia, unspecified: Secondary | ICD-10-CM | POA: Insufficient documentation

## 2011-08-19 DIAGNOSIS — I10 Essential (primary) hypertension: Secondary | ICD-10-CM | POA: Insufficient documentation

## 2011-08-19 DIAGNOSIS — J069 Acute upper respiratory infection, unspecified: Secondary | ICD-10-CM | POA: Insufficient documentation

## 2011-08-19 DIAGNOSIS — E119 Type 2 diabetes mellitus without complications: Secondary | ICD-10-CM | POA: Insufficient documentation

## 2011-08-19 DIAGNOSIS — R05 Cough: Secondary | ICD-10-CM | POA: Insufficient documentation

## 2011-08-19 DIAGNOSIS — Z79899 Other long term (current) drug therapy: Secondary | ICD-10-CM | POA: Insufficient documentation

## 2011-08-19 DIAGNOSIS — R070 Pain in throat: Secondary | ICD-10-CM | POA: Insufficient documentation

## 2011-08-19 DIAGNOSIS — R059 Cough, unspecified: Secondary | ICD-10-CM | POA: Insufficient documentation

## 2011-08-19 DIAGNOSIS — N898 Other specified noninflammatory disorders of vagina: Secondary | ICD-10-CM | POA: Insufficient documentation

## 2011-08-19 DIAGNOSIS — IMO0001 Reserved for inherently not codable concepts without codable children: Secondary | ICD-10-CM | POA: Insufficient documentation

## 2011-08-19 DIAGNOSIS — J45909 Unspecified asthma, uncomplicated: Secondary | ICD-10-CM | POA: Insufficient documentation

## 2011-08-19 DIAGNOSIS — Z8619 Personal history of other infectious and parasitic diseases: Secondary | ICD-10-CM | POA: Insufficient documentation

## 2011-08-19 DIAGNOSIS — Z862 Personal history of diseases of the blood and blood-forming organs and certain disorders involving the immune mechanism: Secondary | ICD-10-CM | POA: Insufficient documentation

## 2011-08-19 DIAGNOSIS — Z8639 Personal history of other endocrine, nutritional and metabolic disease: Secondary | ICD-10-CM | POA: Insufficient documentation

## 2011-08-19 DIAGNOSIS — J3489 Other specified disorders of nose and nasal sinuses: Secondary | ICD-10-CM | POA: Insufficient documentation

## 2011-08-19 DIAGNOSIS — R6889 Other general symptoms and signs: Secondary | ICD-10-CM | POA: Insufficient documentation

## 2011-08-19 HISTORY — DX: Essential (primary) hypertension: I10

## 2011-08-19 HISTORY — DX: Pure hypercholesterolemia, unspecified: E78.00

## 2011-08-19 HISTORY — DX: Unspecified viral hepatitis B without hepatic coma: B19.10

## 2011-08-19 LAB — URINALYSIS, ROUTINE W REFLEX MICROSCOPIC
Bilirubin Urine: NEGATIVE
Glucose, UA: NEGATIVE mg/dL
Hgb urine dipstick: NEGATIVE
Ketones, ur: NEGATIVE mg/dL
Specific Gravity, Urine: 1.023 (ref 1.005–1.030)
pH: 6 (ref 5.0–8.0)

## 2011-08-19 LAB — WET PREP, GENITAL
WBC, Wet Prep HPF POC: NONE SEEN
Yeast Wet Prep HPF POC: NONE SEEN

## 2011-08-19 LAB — GLUCOSE, CAPILLARY: Glucose-Capillary: 207 mg/dL — ABNORMAL HIGH (ref 70–99)

## 2011-08-19 MED ORDER — LIDOCAINE HCL (PF) 1 % IJ SOLN
INTRAMUSCULAR | Status: AC
  Administered 2011-08-19: 13:00:00
  Filled 2011-08-19: qty 5

## 2011-08-19 MED ORDER — AZITHROMYCIN 600 MG PO TABS: 1200.0000 mg | ORAL_TABLET | Freq: Once | ORAL | Status: DC

## 2011-08-19 MED ORDER — CEFTRIAXONE SODIUM 250 MG IJ SOLR
250.0000 mg | Freq: Once | INTRAMUSCULAR | Status: AC
  Administered 2011-08-19: 250 mg via INTRAMUSCULAR
  Filled 2011-08-19: qty 250

## 2011-08-19 MED ORDER — AZITHROMYCIN 250 MG PO TABS
1000.0000 mg | ORAL_TABLET | Freq: Once | ORAL | Status: AC
  Administered 2011-08-19: 1000 mg via ORAL
  Filled 2011-08-19: qty 4

## 2011-08-19 NOTE — ED Provider Notes (Signed)
History     CSN: [id number]  Arrival date & time 08/19/11  4098   First MD Initiated Contact with Patient 08/19/11 [phone]      Chief Complaint  Patient presents with  . Vaginal Discharge  . Sore Throat    (Consider location/radiation/quality/duration/timing/severity/associated sxs/prior treatment) HPI Comments: Vaginal d/c: She was recently treated for trich by her PCP. She believes that her partner was tested and treated. Has had increased d/c c/w previous over past several days.  Patient is a 46 y.o. female presenting with vaginal discharge and cough. The history is provided by the patient.  Vaginal Discharge This is a new problem. The current episode started in the past 7 days. The problem occurs constantly. The problem has been unchanged. Associated symptoms include chills, coughing, a fever, myalgias and a sore throat. Pertinent negatives include no abdominal pain, change in bowel habit, chest pain, diaphoresis, fatigue, headaches, nausea, neck pain, rash, urinary symptoms or vomiting. The symptoms are aggravated by nothing. She has tried nothing for the symptoms.  Cough This is a new problem. The current episode started more than 2 days ago. The problem occurs constantly. The problem has not changed since onset.The cough is non-productive. Maximum temperature: subjective fever. Associated symptoms include chills, rhinorrhea, sore throat and myalgias. Pertinent negatives include no chest pain, no sweats, no ear pain, no headaches, no shortness of breath, no wheezing and no eye redness. She has tried decongestants and cough syrup for the symptoms. The treatment provided mild relief. Risk factors: family members ill with similar sx. She is a smoker. Her past medical history is significant for asthma.  Vaginal d/c: She was recently treated for trich by her PCP. She believes that her partner was tested and treated. Has had increased d/c c/w previous over past several days.  Past Medical  History  Diagnosis Date  . Asthma   . Diabetes mellitus   . Gout   . High cholesterol   . Hypertension   . Hepatitis B infection     History reviewed. No pertinent past surgical history.  History reviewed. No pertinent family history.  History  Substance Use Topics  . Smoking status: Current Everyday Smoker  . Smokeless tobacco: Not on file  . Alcohol Use: No    OB History    Grav Para Term Preterm Abortions TAB SAB Ect Mult Living                  Review of Systems  Constitutional: Positive for fever and chills. Negative for diaphoresis, activity change, appetite change and fatigue.       Subjective fevers  HENT: Positive for sore throat, rhinorrhea and sneezing. Negative for ear pain, trouble swallowing, neck pain, neck stiffness and voice change.   Eyes: Negative for discharge and redness.  Respiratory: Positive for cough. Negative for chest tightness, shortness of breath and wheezing.   Cardiovascular: Negative for chest pain, palpitations and leg swelling.  Gastrointestinal: Negative for nausea, vomiting, abdominal pain, diarrhea, constipation and change in bowel habit.  Genitourinary: Positive for frequency and vaginal discharge. Negative for dysuria and difficulty urinating.  Musculoskeletal: Positive for myalgias.  Skin: Negative for rash.  Neurological: Negative for dizziness and headaches.    Allergies  Tylenol  Home Medications   Current Outpatient Rx  Name Route Sig Dispense Refill  . DIPHENHYDRAMINE HCL 25 MG PO TABS Oral Take 25 mg by mouth every 6 (six) hours as needed. itching     . OMEGA-3 FATTY ACIDS 1000  MG PO CAPS Oral Take 1 g by mouth daily.      Marland Kitchen HYDROXYZINE HCL 25 MG PO TABS Oral Take 25 mg by mouth 3 (three) times daily as needed. Itching      . IBUPROFEN 600 MG PO TABS Oral Take 600 mg by mouth every 6 (six) hours as needed. Pain from gout      . INSULIN LISPRO (HUMAN) 100 UNIT/ML [place] SOLN Subcutaneous Inject 2-5 Units into the skin 3  (three) times daily before meals. Sliding scale uses with humilin N .     . INSULIN REGULAR HUMAN 100 UNIT/ML IJ SOLN Subcutaneous Inject 4-11 Units into the skin 3 (three) times daily before meals. Sliding scale uses with humalog as well      . MERCAPTOPURINE 50 MG PO TABS Oral Take 50 mg by mouth daily. Give on an empty stomach 1 hour before or 2 hours after meals. Caution: Chemotherapy.     . METFORMIN HCL 1000 MG PO TABS Oral Take 2,000 mg by mouth 2 (two) times daily with a meal.      . PREDNISONE 20 MG PO TABS Oral Take 30 mg by mouth daily.      . TRAZODONE HCL 150 MG PO TABS Oral Take 150 mg by mouth at bedtime.      . VENLAFAXINE HCL ER 150 MG PO CP24 Oral Take 150 mg by mouth daily.        BP 119/81  Pulse 83  Temp(Src) 97.8 F (36.6 C) (Oral)  Resp 18  SpO2 98%  LMP 07/26/2011  Physical Exam  Nursing note and vitals reviewed. Constitutional: She appears well-developed and well-nourished.  Non-toxic appearance. No distress.  HENT:  Head: Normocephalic and atraumatic.  Right Ear: Tympanic membrane and external ear normal.  Left Ear: Tympanic membrane and external ear normal.  Mouth/Throat: Oropharynx is clear and moist. No oropharyngeal exudate.  Eyes: Conjunctivae and EOM are normal. Right eye exhibits no discharge. Left eye exhibits no discharge.  Neck: Normal range of motion. Neck supple.  Cardiovascular: Normal rate, regular rhythm and normal heart sounds.   Pulmonary/Chest: Effort normal and breath sounds normal. She has no wheezes. She exhibits no tenderness.  Abdominal: Soft. There is no tenderness. There is no rebound and no guarding.  Genitourinary: Uterus normal. Cervix exhibits no motion tenderness and no friability. Vaginal discharge found.       RN chaperone present during exam  Thin white dc present  Lymphadenopathy:    She has no cervical adenopathy.  Neurological: She is alert.  Skin: Skin is warm and dry. She is not diaphoretic.  Psychiatric: She has a  normal mood and affect.    ED Course  Procedures (including critical care time)  Labs Reviewed  GLUCOSE, CAPILLARY - Abnormal; Notable for the following:    Glucose-Capillary 207 (*)    All other components within normal limits  WET PREP, GENITAL  URINALYSIS, ROUTINE W REFLEX MICROSCOPIC  POCT PREGNANCY, URINE  GC/CHLAMYDIA PROBE AMP, GENITAL   No results found.   1. URI (upper respiratory infection)   2. Vaginal discharge       MDM  Wet prep negative. Pt preemptively txed for GC/chlam. Instructed to avoid unprotected sex for the next week.  Pt's cough, congestion most c/w viral URI. She is nontoxic appearing. No adventitious breath sounds on exam. Discussed symptomatic treatment.  Return precautions discussed.        Grant Fontana, [place] 08/19/11 [id number]

## 2011-08-19 NOTE — ED Notes (Signed)
Reports having vaginal irritation and discharge, was recently dx with trich, also having sore throat and cough x 1 week.

## 2011-08-20 NOTE — ED Provider Notes (Signed)
Medical screening examination/treatment/procedure(s) were performed by non-physician practitioner and as supervising physician I was immediately available for consultation/collaboration.   Gwyneth Sprout, MD 08/20/11 [phone]

## 2011-08-21 LAB — GC/CHLAMYDIA PROBE AMP, GENITAL
Chlamydia, DNA Probe: NEGATIVE
GC Probe Amp, Genital: NEGATIVE

## 2011-09-22 ENCOUNTER — Encounter (HOSPITAL_COMMUNITY): Payer: Self-pay

## 2011-09-22 ENCOUNTER — Emergency Department (HOSPITAL_COMMUNITY)
Admission: EM | Admit: 2011-09-22 | Discharge: 2011-09-22 | Disposition: A | Discharge status: Home / Self Care | Payer: Medicaid Other | Attending: Emergency Medicine | Admitting: Emergency Medicine

## 2011-09-22 DIAGNOSIS — Z79899 Other long term (current) drug therapy: Secondary | ICD-10-CM | POA: Insufficient documentation

## 2011-09-22 DIAGNOSIS — H9209 Otalgia, unspecified ear: Secondary | ICD-10-CM | POA: Insufficient documentation

## 2011-09-22 DIAGNOSIS — R6889 Other general symptoms and signs: Secondary | ICD-10-CM | POA: Insufficient documentation

## 2011-09-22 DIAGNOSIS — Z794 Long term (current) use of insulin: Secondary | ICD-10-CM | POA: Insufficient documentation

## 2011-09-22 DIAGNOSIS — E119 Type 2 diabetes mellitus without complications: Secondary | ICD-10-CM | POA: Insufficient documentation

## 2011-09-22 DIAGNOSIS — Z8639 Personal history of other endocrine, nutritional and metabolic disease: Secondary | ICD-10-CM | POA: Insufficient documentation

## 2011-09-22 DIAGNOSIS — J020 Streptococcal pharyngitis: Secondary | ICD-10-CM | POA: Insufficient documentation

## 2011-09-22 DIAGNOSIS — R1310 Dysphagia, unspecified: Secondary | ICD-10-CM | POA: Insufficient documentation

## 2011-09-22 DIAGNOSIS — R509 Fever, unspecified: Secondary | ICD-10-CM | POA: Insufficient documentation

## 2011-09-22 DIAGNOSIS — I10 Essential (primary) hypertension: Secondary | ICD-10-CM | POA: Insufficient documentation

## 2011-09-22 DIAGNOSIS — R070 Pain in throat: Secondary | ICD-10-CM | POA: Insufficient documentation

## 2011-09-22 DIAGNOSIS — R05 Cough: Secondary | ICD-10-CM | POA: Insufficient documentation

## 2011-09-22 DIAGNOSIS — J351 Hypertrophy of tonsils: Secondary | ICD-10-CM | POA: Insufficient documentation

## 2011-09-22 DIAGNOSIS — J45909 Unspecified asthma, uncomplicated: Secondary | ICD-10-CM | POA: Insufficient documentation

## 2011-09-22 DIAGNOSIS — J0300 Acute streptococcal tonsillitis, unspecified: Secondary | ICD-10-CM

## 2011-09-22 DIAGNOSIS — Z862 Personal history of diseases of the blood and blood-forming organs and certain disorders involving the immune mechanism: Secondary | ICD-10-CM | POA: Insufficient documentation

## 2011-09-22 DIAGNOSIS — J3489 Other specified disorders of nose and nasal sinuses: Secondary | ICD-10-CM | POA: Insufficient documentation

## 2011-09-22 DIAGNOSIS — Z8619 Personal history of other infectious and parasitic diseases: Secondary | ICD-10-CM | POA: Insufficient documentation

## 2011-09-22 DIAGNOSIS — R059 Cough, unspecified: Secondary | ICD-10-CM | POA: Insufficient documentation

## 2011-09-22 DIAGNOSIS — E7800 Pure hypercholesterolemia, unspecified: Secondary | ICD-10-CM | POA: Insufficient documentation

## 2011-09-22 LAB — RAPID STREP SCREEN (MED CTR MEBANE ONLY): Streptococcus, Group A Screen (Direct): POSITIVE — AB

## 2011-09-22 MED ORDER — PREDNISONE 20 MG PO TABS
60.0000 mg | ORAL_TABLET | Freq: Once | ORAL | Status: AC
  Administered 2011-09-22: 60 mg via ORAL
  Filled 2011-09-22: qty 3

## 2011-09-22 MED ORDER — OXYCODONE HCL 10 MG PO TB12: 10.0000 mg | ORAL_TABLET | Freq: Two times a day (BID) | ORAL | Status: DC

## 2011-09-22 MED ORDER — PENICILLIN G BENZATHINE 1200000 UNIT/2ML IM SUSP
1.2000 10*6.[IU] | Freq: Once | INTRAMUSCULAR | Status: AC
  Administered 2011-09-22: 1.2 10*6.[IU] via INTRAMUSCULAR
  Filled 2011-09-22: qty 2

## 2011-09-22 NOTE — ED Notes (Signed)
Sinus infection and sore throat for 3 days

## 2011-09-22 NOTE — ED Provider Notes (Signed)
History     CSN: [id number]  Arrival date & time 09/22/11  0750   First MD Initiated Contact with Patient 09/22/11 [phone]      Chief Complaint  Patient presents with  . Sore Throat    (Consider location/radiation/quality/duration/timing/severity/associated sxs/prior treatment) HPI  46 yo female presents c/o sinus congestion and sore throat x 3 days. Patient states she is having subjective fever, chills, sore throat with difficulty swallowing but denies voice changes.  Also complaining of nasal congestions, ear aches, sneezing, nonproductive cough. She has tried, with salt water with minimal improvement. She denies taking Tylenol or ibuprofen secondary to liver cirrhosis. Patient denies chest pain, shortness of breath, abdominal pain she has nausea without vomiting or diarrhea. She denies any rash.  Past Medical History  Diagnosis Date  . Asthma   . Diabetes mellitus   . Gout   . High cholesterol   . Hypertension   . Hepatitis B infection     History reviewed. No pertinent past surgical history.  History reviewed. No pertinent family history.  History  Substance Use Topics  . Smoking status: Current Everyday Smoker  . Smokeless tobacco: Not on file  . Alcohol Use: No    OB History    Grav Para Term Preterm Abortions TAB SAB Ect Mult Living                  Review of Systems  All other systems reviewed and are negative.    Allergies  Tylenol  Home Medications   Current Outpatient Rx  Name Route Sig Dispense Refill  . DIPHENHYDRAMINE HCL 25 MG PO TABS Oral Take 25 mg by mouth every 6 (six) hours as needed. itching     . OMEGA-3 FATTY ACIDS 1000 MG PO CAPS Oral Take 1 g by mouth daily.      Marland Kitchen HYDROXYZINE HCL 25 MG PO TABS Oral Take 25 mg by mouth 3 (three) times daily as needed. Itching      . IBUPROFEN 600 MG PO TABS Oral Take 600 mg by mouth every 6 (six) hours as needed. Pain from gout      . INSULIN LISPRO (HUMAN) 100 UNIT/ML [place] SOLN Subcutaneous Inject  2-5 Units into the skin 3 (three) times daily before meals. Sliding scale uses with humilin N .     . INSULIN REGULAR HUMAN 100 UNIT/ML IJ SOLN Subcutaneous Inject 4-11 Units into the skin 3 (three) times daily before meals. Sliding scale uses with humalog as well      . MERCAPTOPURINE 50 MG PO TABS Oral Take 50 mg by mouth daily. Give on an empty stomach 1 hour before or 2 hours after meals. Caution: Chemotherapy.     . METFORMIN HCL 1000 MG PO TABS Oral Take 2,000 mg by mouth 2 (two) times daily with a meal.      . PREDNISONE 20 MG PO TABS Oral Take 30 mg by mouth daily.      . TRAZODONE HCL 150 MG PO TABS Oral Take 150 mg by mouth at bedtime.      . VENLAFAXINE HCL ER 150 MG PO CP24 Oral Take 150 mg by mouth daily.        BP 144/96  Pulse 85  Temp(Src) 98 F (36.7 C) (Oral)  Ht 5\' 1"  (1.549 m)  Wt 200 lb (90.719 kg)  BMI 37.79 kg/m2  SpO2 99%  Physical Exam  Constitutional: She is oriented to person, place, and time. She appears well-developed and well-nourished.  No distress.  HENT:  Head: Normocephalic and atraumatic.  Right Ear: External ear normal.  Left Ear: External ear normal.  Nose: Nose normal.  Mouth/Throat: Uvula is midline. Oropharyngeal exudate present. No posterior oropharyngeal edema, posterior oropharyngeal erythema or tonsillar abscesses.       Bilateral tonsillar enlargement with mild exudates. Uvula is midline. No evidence of peritonsillar abscess. No evidence of oral control the edema concerning for Ludwig's angina.  Eyes: Conjunctivae are normal.  Neck: Normal range of motion. Neck supple.  Cardiovascular: Normal rate and regular rhythm.  Exam reveals no gallop and no friction rub.   No murmur heard. Pulmonary/Chest: Effort normal. No respiratory distress. She has no wheezes.  Abdominal: Soft. Bowel sounds are normal. There is no tenderness.  Musculoskeletal: Normal range of motion.  Neurological: She is alert and oriented to person, place, and time.    ED  Course  Procedures (including critical care time)  Labs Reviewed - No data to display No results found.   No diagnosis found.  Results for orders placed during the hospital encounter of 09/22/11  RAPID STREP SCREEN      Component Value Range   Streptococcus, Group A Screen (Direct) POSITIVE (*) NEGATIVE    No results found.    MDM  Patient symptoms suggestive of viral etiology. A strep test sent. She is currently afebrile in no acute distress. She maintained normal voice, and able to tolerate by mouth.    9:43 AM Strep test is positive. Patient given prednisone, Bicillin IM.  She is able to tolerate by mouth. She has a history of diabetes, and therefore she will not be given prednisone at discharge. I will give her pain medication prescription for pain. Patient was instructed to limits spread of exposure by excising skin at the caution. Patient was understanding.    Fayrene Helper, PA-C 09/22/11 [phone]

## 2011-09-23 NOTE — ED Provider Notes (Signed)
Medical screening examination/treatment/procedure(s) were performed by non-physician practitioner and as supervising physician I was immediately available for consultation/collaboration.  Flint Melter, MD 09/23/11 [phone]

## 2011-11-14 ENCOUNTER — Other Ambulatory Visit: Payer: Self-pay | Admitting: Internal Medicine

## 2011-12-14 ENCOUNTER — Emergency Department (HOSPITAL_COMMUNITY)
Admission: EM | Admit: 2011-12-14 | Discharge: 2011-12-14 | Disposition: A | Discharge status: Home / Self Care | Payer: Medicaid Other | Attending: Emergency Medicine | Admitting: Emergency Medicine

## 2011-12-14 ENCOUNTER — Encounter (HOSPITAL_COMMUNITY): Payer: Self-pay | Admitting: Emergency Medicine

## 2011-12-14 DIAGNOSIS — Z794 Long term (current) use of insulin: Secondary | ICD-10-CM | POA: Insufficient documentation

## 2011-12-14 DIAGNOSIS — E7800 Pure hypercholesterolemia, unspecified: Secondary | ICD-10-CM | POA: Insufficient documentation

## 2011-12-14 DIAGNOSIS — F17200 Nicotine dependence, unspecified, uncomplicated: Secondary | ICD-10-CM | POA: Insufficient documentation

## 2011-12-14 DIAGNOSIS — I10 Essential (primary) hypertension: Secondary | ICD-10-CM | POA: Insufficient documentation

## 2011-12-14 DIAGNOSIS — N898 Other specified noninflammatory disorders of vagina: Secondary | ICD-10-CM | POA: Insufficient documentation

## 2011-12-14 DIAGNOSIS — E119 Type 2 diabetes mellitus without complications: Secondary | ICD-10-CM | POA: Insufficient documentation

## 2011-12-14 DIAGNOSIS — J45909 Unspecified asthma, uncomplicated: Secondary | ICD-10-CM | POA: Insufficient documentation

## 2011-12-14 DIAGNOSIS — L293 Anogenital pruritus, unspecified: Secondary | ICD-10-CM | POA: Insufficient documentation

## 2011-12-14 LAB — URINALYSIS, ROUTINE W REFLEX MICROSCOPIC
Glucose, UA: NEGATIVE mg/dL
Leukocytes, UA: NEGATIVE
Nitrite: NEGATIVE
Specific Gravity, Urine: 1.025 (ref 1.005–1.030)
pH: 6.5 (ref 5.0–8.0)

## 2011-12-14 LAB — WET PREP, GENITAL: Trich, Wet Prep: NONE SEEN

## 2011-12-14 LAB — POCT PREGNANCY, URINE: Preg Test, Ur: NEGATIVE

## 2011-12-14 MED ORDER — CEFTRIAXONE SODIUM 250 MG IJ SOLR
250.0000 mg | Freq: Once | INTRAMUSCULAR | Status: AC
  Administered 2011-12-14: 250 mg via INTRAMUSCULAR
  Filled 2011-12-14: qty 250

## 2011-12-14 MED ORDER — FLUCONAZOLE 200 MG PO TABS
200.0000 mg | ORAL_TABLET | Freq: Once | ORAL | Status: AC
  Administered 2011-12-14: 200 mg via ORAL
  Filled 2011-12-14: qty 1

## 2011-12-14 MED ORDER — LIDOCAINE HCL (PF) 1 % IJ SOLN
INTRAMUSCULAR | Status: AC
  Administered 2011-12-14: 16:00:00
  Filled 2011-12-14: qty 5

## 2011-12-14 MED ORDER — AZITHROMYCIN 250 MG PO TABS
1000.0000 mg | ORAL_TABLET | Freq: Once | ORAL | Status: AC
  Administered 2011-12-14: 1000 mg via ORAL
  Filled 2011-12-14: qty 4

## 2011-12-14 NOTE — ED Notes (Signed)
Pt c/o pink colored vaginal discharge with irritation x 4 days

## 2011-12-14 NOTE — ED Notes (Signed)
Pelvic cart set up at bedside  

## 2011-12-14 NOTE — Discharge Instructions (Signed)
FOLLOW UP WITH YOUR DOCTOR FOR RECHECK OF SYMPTOMS. RETURN HERE AS NEEDED.

## 2011-12-14 NOTE — ED Provider Notes (Signed)
History     CSN: [id number]  Arrival date & time 12/14/11  1048   First MD Initiated Contact with Patient 12/14/11 1402      Chief Complaint  Patient presents with  . Vaginal Discharge    (Consider location/radiation/quality/duration/timing/severity/associated sxs/prior treatment) Patient is a 46 y.o. female presenting with vaginal discharge.  Vaginal Discharge This is a new problem. The current episode started in the past 7 days. The problem occurs constantly. The problem has been unchanged. Pertinent negatives include no fever, nausea or vomiting. Associated symptoms comments: Vaginal discharge and itching for 4 days. She denies abdominal pain, N, V, fever. No dysuria..    Past Medical History  Diagnosis Date  . Asthma   . Diabetes mellitus   . Gout   . High cholesterol   . Hypertension   . Hepatitis B infection     History reviewed. No pertinent past surgical history.  History reviewed. No pertinent family history.  History  Substance Use Topics  . Smoking status: Current Everyday Smoker  . Smokeless tobacco: Not on file  . Alcohol Use: No    OB History    Grav Para Term Preterm Abortions TAB SAB Ect Mult Living                  Review of Systems  Constitutional: Negative for fever.  Gastrointestinal: Negative for nausea, vomiting and abdominal distention.  Genitourinary: Positive for vaginal discharge. Negative for dysuria.    Allergies  Tylenol  Home Medications   Current Outpatient Rx  Name Route Sig Dispense Refill  . ALBUTEROL SULFATE HFA 108 (90 BASE) MCG/ACT IN AERS Inhalation Inhale 2 puffs into the lungs every 6 (six) hours as needed. For wheeze or shortness of breath    . DEXILANT PO Oral Take 1 tablet by mouth daily. Samples given by Dr. Debarah Crape    . DIPHENHYDRAMINE HCL 25 MG PO TABS Oral Take 25 mg by mouth every 6 (six) hours as needed. itching    . OMEGA-3 FATTY ACIDS 1000 MG PO CAPS Oral Take 1 g by mouth daily.      Marland Kitchen HYDROXYZINE HCL  25 MG PO TABS Oral Take 25 mg by mouth 3 (three) times daily as needed. Itching      . IBUPROFEN 600 MG PO TABS Oral Take 600 mg by mouth every 6 (six) hours as needed. Pain from gout      . INSULIN LISPRO (HUMAN) 100 UNIT/ML [place] SOLN Subcutaneous Inject 2-5 Units into the skin 3 (three) times daily before meals. Sliding scale uses with humilin N .     . INSULIN REGULAR HUMAN 100 UNIT/ML IJ SOLN Subcutaneous Inject 4-11 Units into the skin 3 (three) times daily before meals. Sliding scale uses with humalog as well      . MERCAPTOPURINE 50 MG PO TABS Oral Take 75 mg by mouth daily. Give on an empty stomach 1 hour before or 2 hours after meals. Caution: Chemotherapy.    . METFORMIN HCL 1000 MG PO TABS Oral Take 2,000 mg by mouth 2 (two) times daily with a meal.      . PREDNISONE 20 MG PO TABS Oral Take 20 mg by mouth daily.     . TRAZODONE HCL 150 MG PO TABS Oral Take 150 mg by mouth at bedtime.      . VENLAFAXINE HCL ER 150 MG PO CP24 Oral Take 150 mg by mouth daily.        BP 126/90  Pulse  82  Temp(Src) 98 F (36.7 C) (Oral)  Resp 16  SpO2 99%  Physical Exam  Constitutional: She is oriented to person, place, and time. She appears well-developed and well-nourished.  Pulmonary/Chest: Effort normal.  Abdominal: Soft. There is no tenderness. There is no rebound and no guarding.  Genitourinary:       Mild vaginal discharge that appears slightly bloody. Cervix mildly tender without adnexal tenderness or mass.   Neurological: She is alert and oriented to person, place, and time.    ED Course  Procedures (including critical care time)  Labs Reviewed  URINALYSIS, ROUTINE W REFLEX MICROSCOPIC - Abnormal; Notable for the following:    Urobilinogen, UA 2.0 (*)    All other components within normal limits  WET PREP, GENITAL - Abnormal; Notable for the following:    Clue Cells Wet Prep HPF POC MANY (*)    WBC, Wet Prep HPF POC FEW (*)    All other components within normal limits  POCT  PREGNANCY, URINE  GC/CHLAMYDIA PROBE AMP, GENITAL   No results found.   No diagnosis found. 1. Vaginal discharge 2. Vaginal itching   MDM  Patient requests treatment of discharge to cover STD's. Medications ordered. Will treat vaginal itching with Diflucan symptomatically. Recommend follow up with her doctor for further management.        Rodena Medin, PA-C 12/14/11 1512

## 2011-12-14 NOTE — ED Provider Notes (Signed)
Medical screening examination/treatment/procedure(s) were performed by non-physician practitioner and as supervising physician I was immediately available for consultation/collaboration.   Carleene Cooper III, MD 12/14/11 2040

## 2011-12-15 LAB — GC/CHLAMYDIA PROBE AMP, GENITAL: Chlamydia, DNA Probe: NEGATIVE

## 2012-01-30 ENCOUNTER — Inpatient Hospital Stay (HOSPITAL_COMMUNITY)
Admission: AD | Admit: 2012-01-30 | Discharge: 2012-01-30 | Disposition: A | Discharge status: Home / Self Care | Payer: Medicaid Other | Source: Ambulatory Visit | Attending: Family Medicine | Admitting: Family Medicine

## 2012-01-30 ENCOUNTER — Encounter (HOSPITAL_COMMUNITY): Payer: Self-pay | Admitting: *Deleted

## 2012-01-30 DIAGNOSIS — E1169 Type 2 diabetes mellitus with other specified complication: Secondary | ICD-10-CM | POA: Diagnosis present

## 2012-01-30 DIAGNOSIS — J45909 Unspecified asthma, uncomplicated: Secondary | ICD-10-CM | POA: Diagnosis present

## 2012-01-30 DIAGNOSIS — IMO0002 Reserved for concepts with insufficient information to code with codable children: Secondary | ICD-10-CM

## 2012-01-30 DIAGNOSIS — A499 Bacterial infection, unspecified: Secondary | ICD-10-CM

## 2012-01-30 DIAGNOSIS — E119 Type 2 diabetes mellitus without complications: Secondary | ICD-10-CM | POA: Diagnosis present

## 2012-01-30 DIAGNOSIS — R197 Diarrhea, unspecified: Secondary | ICD-10-CM | POA: Insufficient documentation

## 2012-01-30 DIAGNOSIS — K754 Autoimmune hepatitis: Secondary | ICD-10-CM | POA: Diagnosis present

## 2012-01-30 DIAGNOSIS — B9689 Other specified bacterial agents as the cause of diseases classified elsewhere: Secondary | ICD-10-CM

## 2012-01-30 DIAGNOSIS — N949 Unspecified condition associated with female genital organs and menstrual cycle: Secondary | ICD-10-CM | POA: Insufficient documentation

## 2012-01-30 DIAGNOSIS — M109 Gout, unspecified: Secondary | ICD-10-CM | POA: Diagnosis present

## 2012-01-30 DIAGNOSIS — N760 Acute vaginitis: Secondary | ICD-10-CM

## 2012-01-30 DIAGNOSIS — I10 Essential (primary) hypertension: Secondary | ICD-10-CM | POA: Diagnosis present

## 2012-01-30 DIAGNOSIS — R109 Unspecified abdominal pain: Secondary | ICD-10-CM | POA: Insufficient documentation

## 2012-01-30 HISTORY — DX: Major depressive disorder, single episode, unspecified: F32.9

## 2012-01-30 HISTORY — DX: Unspecified cirrhosis of liver: K74.60

## 2012-01-30 HISTORY — DX: Anxiety disorder, unspecified: F41.9

## 2012-01-30 HISTORY — DX: Depression, unspecified: F32.A

## 2012-01-30 HISTORY — DX: Bipolar disorder, unspecified: F31.9

## 2012-01-30 LAB — WET PREP, GENITAL: WBC, Wet Prep HPF POC: NONE SEEN

## 2012-01-30 MED ORDER — METRONIDAZOLE 500 MG PO TABS: 500.0000 mg | ORAL_TABLET | Freq: Three times a day (TID) | ORAL | Status: AC

## 2012-01-30 NOTE — Discharge Instructions (Signed)
Bacterial Vaginosis Bacterial vaginosis (BV) is a vaginal infection where the normal balance of bacteria in the vagina is disrupted. The normal balance is then replaced by an overgrowth of certain bacteria. There are several different kinds of bacteria that can cause BV. BV is the most common vaginal infection in women of childbearing age. CAUSES   The cause of BV is not fully understood. BV develops when there is an increase or imbalance of harmful bacteria.   Some activities or behaviors can upset the normal balance of bacteria in the vagina and put women at increased risk including:   Having a new sex partner or multiple sex partners.   Douching.   Using an intrauterine device (IUD) for contraception.   It is not clear what role sexual activity plays in the development of BV. However, women that have never had sexual intercourse are rarely infected with BV.  Women do not get BV from toilet seats, bedding, swimming pools or from touching objects around them.  SYMPTOMS   Grey vaginal discharge.   A fish-like odor with discharge, especially after sexual intercourse.   Itching or burning of the vagina and vulva.   Burning or pain with urination.   Some women have no signs or symptoms at all.  DIAGNOSIS  Your caregiver must examine the vagina for signs of BV. Your caregiver will perform lab tests and look at the sample of vaginal fluid through a microscope. They will look for bacteria and abnormal cells (clue cells), a pH test higher than 4.5, and a positive amine test all associated with BV.  RISKS AND COMPLICATIONS   Pelvic inflammatory disease (PID).   Infections following gynecology surgery.   Developing HIV.   Developing herpes virus.  TREATMENT  Sometimes BV will clear up without treatment. However, all women with symptoms of BV should be treated to avoid complications, especially if gynecology surgery is planned. Female partners generally do not need to be treated. However,  BV may spread between female sex partners so treatment is helpful in preventing a recurrence of BV.   BV may be treated with antibiotics. The antibiotics come in either pill or vaginal cream forms. Either can be used with nonpregnant or pregnant women, but the recommended dosages differ. These antibiotics are not harmful to the baby.   BV can recur after treatment. If this happens, a second round of antibiotics will often be prescribed.   Treatment is important for pregnant women. If not treated, BV can cause a premature delivery, especially for a pregnant woman who had a premature birth in the past. All pregnant women who have symptoms of BV should be checked and treated.   For chronic reoccurrence of BV, treatment with a type of prescribed gel vaginally twice a week is helpful.  HOME CARE INSTRUCTIONS   Finish all medication as directed by your caregiver.   Do not have sex until treatment is completed.   Tell your sexual partner that you have a vaginal infection. They should see their caregiver and be treated if they have problems, such as a mild rash or itching.   Practice safe sex. Use condoms. Only have 1 sex partner.  PREVENTION  Basic prevention steps can help reduce the risk of upsetting the natural balance of bacteria in the vagina and developing BV:  Do not have sexual intercourse (be abstinent).   Do not douche.   Use all of the medicine prescribed for treatment of BV, even if the signs and symptoms go away.     Tell your sex partner if you have BV. That way, they can be treated, if needed, to prevent reoccurrence.  SEEK MEDICAL CARE IF:   Your symptoms are not improving after 3 days of treatment.   You have increased discharge, pain, or fever.  MAKE SURE YOU:   Understand these instructions.   Will watch your condition.   Will get help right away if you are not doing well or get worse.  FOR MORE INFORMATION  Division of STD Prevention (DSTDP), Centers for Disease  Control and Prevention: www.cdc.gov/std American Social Health Association (ASHA): www.ashastd.org  Document Released: 07/31/2005 Document Revised: 07/20/2011 Document Reviewed: 01/21/2009 Exit[employer] Patient Information 2012 [employer]. 

## 2012-01-30 NOTE — MAU Note (Signed)
Pt states, " I've had a itchy vaginal discharge for 1 1/2 weeks and low abdomen pressure for one week. I've had diarrhea and real bad pains in the bottom of my belly  Every time I eat for the last 9-10 days. I thought it was because my doctor upped by chemo drug."
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
## 2012-01-31 LAB — GC/CHLAMYDIA PROBE AMP, GENITAL: GC Probe Amp, Genital: NEGATIVE

## 2012-02-02 NOTE — MAU Provider Note (Signed)
Chart reviewed and agree with management and plan.  

## 2012-02-09 ENCOUNTER — Other Ambulatory Visit: Payer: Self-pay | Admitting: Internal Medicine

## 2012-03-04 ENCOUNTER — Encounter (HOSPITAL_COMMUNITY): Payer: Self-pay

## 2012-03-04 ENCOUNTER — Emergency Department (HOSPITAL_COMMUNITY)
Admission: EM | Admit: 2012-03-04 | Discharge: 2012-03-04 | Disposition: A | Discharge status: Home / Self Care | Payer: Medicaid Other | Attending: Emergency Medicine | Admitting: Emergency Medicine

## 2012-03-04 DIAGNOSIS — E119 Type 2 diabetes mellitus without complications: Secondary | ICD-10-CM | POA: Insufficient documentation

## 2012-03-04 DIAGNOSIS — F411 Generalized anxiety disorder: Secondary | ICD-10-CM | POA: Insufficient documentation

## 2012-03-04 DIAGNOSIS — N72 Inflammatory disease of cervix uteri: Secondary | ICD-10-CM | POA: Insufficient documentation

## 2012-03-04 DIAGNOSIS — B9689 Other specified bacterial agents as the cause of diseases classified elsewhere: Secondary | ICD-10-CM

## 2012-03-04 DIAGNOSIS — M109 Gout, unspecified: Secondary | ICD-10-CM | POA: Insufficient documentation

## 2012-03-04 DIAGNOSIS — I10 Essential (primary) hypertension: Secondary | ICD-10-CM | POA: Insufficient documentation

## 2012-03-04 DIAGNOSIS — F17200 Nicotine dependence, unspecified, uncomplicated: Secondary | ICD-10-CM | POA: Insufficient documentation

## 2012-03-04 DIAGNOSIS — F319 Bipolar disorder, unspecified: Secondary | ICD-10-CM | POA: Insufficient documentation

## 2012-03-04 DIAGNOSIS — J45909 Unspecified asthma, uncomplicated: Secondary | ICD-10-CM | POA: Insufficient documentation

## 2012-03-04 DIAGNOSIS — E7800 Pure hypercholesterolemia, unspecified: Secondary | ICD-10-CM | POA: Insufficient documentation

## 2012-03-04 DIAGNOSIS — N760 Acute vaginitis: Secondary | ICD-10-CM

## 2012-03-04 LAB — URINALYSIS, ROUTINE W REFLEX MICROSCOPIC
Glucose, UA: NEGATIVE mg/dL
Hgb urine dipstick: NEGATIVE
Leukocytes, UA: NEGATIVE
Specific Gravity, Urine: 1.024 (ref 1.005–1.030)
pH: 5.5 (ref 5.0–8.0)

## 2012-03-04 LAB — WET PREP, GENITAL: Yeast Wet Prep HPF POC: NONE SEEN

## 2012-03-04 MED ORDER — CEFTRIAXONE SODIUM 250 MG IJ SOLR
250.0000 mg | Freq: Once | INTRAMUSCULAR | Status: AC
  Administered 2012-03-04: 250 mg via INTRAMUSCULAR
  Filled 2012-03-04: qty 250

## 2012-03-04 MED ORDER — LIDOCAINE HCL 1 % IJ SOLN
INTRAMUSCULAR | Status: AC
  Administered 2012-03-04: 13:00:00
  Filled 2012-03-04: qty 20

## 2012-03-04 MED ORDER — AZITHROMYCIN 250 MG PO TABS
1000.0000 mg | ORAL_TABLET | Freq: Once | ORAL | Status: AC
  Administered 2012-03-04: 1000 mg via ORAL
  Filled 2012-03-04: qty 4

## 2012-03-04 MED ORDER — FLUCONAZOLE 200 MG PO TABS: 200.0000 mg | ORAL_TABLET | Freq: Every day | ORAL | Status: AC

## 2012-03-04 MED ORDER — METRONIDAZOLE 500 MG PO TABS: 500.0000 mg | ORAL_TABLET | Freq: Two times a day (BID) | ORAL | Status: AC

## 2012-03-04 NOTE — ED Provider Notes (Signed)
Medical screening examination/treatment/procedure(s) were performed by non-physician practitioner and as supervising physician I was immediately available for consultation/collaboration. Devoria Albe, MD, [facility]   Ward Givens, MD 03/04/12 1225

## 2012-03-04 NOTE — ED Provider Notes (Signed)
History     CSN: [id number]  Arrival date & time 03/04/12  8756   First MD Initiated Contact with Patient 03/04/12 0939      10:10 AM HPI Pt reports yellow thick vaginal discharge that began approximately 1 week ago. Reports discharge is malodorous. A/w vulvar pruritis, dysuria, urinary frequency, urgency, lower abdominal pain and low back pain. Reports symptoms are similar to prior STD.  Patient is a 46 y.o. female presenting with vaginal discharge. The history is provided by the patient.  Vaginal Discharge This is a new problem. The current episode started in the past 7 days. The problem occurs constantly. The problem has been unchanged. Associated symptoms include abdominal pain and urinary symptoms. Pertinent negatives include no chest pain, chills, fever, nausea, neck pain, numbness or vomiting. The symptoms are aggravated by intercourse. She has tried nothing for the symptoms.    Past Medical History  Diagnosis Date  . Asthma   . Diabetes mellitus   . Gout   . High cholesterol   . Hypertension   . Hepatitis B infection   . Anxiety   . Depression   . Bipolar 1 disorder   . Liver cirrhosis     Past Surgical History  Procedure Date  . Cesarean section   . Tubal ligation   . Laparoscopy   . Biopsy of liver     Family History  Problem Relation Age of Onset  . Other Neg Hx     History  Substance Use Topics  . Smoking status: Current Everyday Smoker -- 0.2 packs/day  . Smokeless tobacco: Not on file  . Alcohol Use: No     ocassional    OB History    Grav Para Term Preterm Abortions TAB SAB Ect Mult Living   6 4 0 4 2 0 2  0 4      Review of Systems  Constitutional: Negative for fever and chills.  HENT: Negative for neck pain.   Respiratory: Negative for shortness of breath.   Cardiovascular: Negative for chest pain.  Gastrointestinal: Positive for abdominal pain. Negative for nausea, vomiting, diarrhea and constipation.  Genitourinary: Positive for  dysuria, frequency, hematuria and vaginal discharge. Negative for urgency, flank pain, vaginal bleeding and vaginal pain.  Musculoskeletal: Positive for back pain.  Neurological: Negative for numbness.  All other systems reviewed and are negative.    Allergies  Tylenol  Home Medications   Current Outpatient Rx  Name Route Sig Dispense Refill  . ALBUTEROL SULFATE HFA 108 (90 BASE) MCG/ACT IN AERS Inhalation Inhale 2 puffs into the lungs every 6 (six) hours as needed. For wheeze or shortness of breath    . DEXILANT PO Oral Take 1 tablet by mouth daily. Samples given by Dr. Debarah Crape    . DIPHENHYDRAMINE HCL 25 MG PO TABS Oral Take 25 mg by mouth every 6 (six) hours as needed. itching    . OMEGA-3 FATTY ACIDS 1000 MG PO CAPS Oral Take 1 g by mouth daily.      Marland Kitchen HYDROXYZINE HCL 25 MG PO TABS Oral Take 25 mg by mouth 3 (three) times daily as needed. Itching      . IBUPROFEN 800 MG PO TABS Oral Take 800 mg by mouth every 6 (six) hours as needed. For  pain    . INSULIN LISPRO (HUMAN) 100 UNIT/ML [place] SOLN Subcutaneous Inject 2-5 Units into the skin 3 (three) times daily before meals. Sliding scale uses with humilin N .     Marland Kitchen  INSULIN REGULAR HUMAN 100 UNIT/ML [place] SOLN Subcutaneous Inject 4-11 Units into the skin 3 (three) times daily before meals. Sliding scale uses with humalog as well      . MERCAPTOPURINE 50 MG PO TABS Oral Take 75 mg by mouth daily. Give on an empty stomach 1 hour before or 2 hours after meals. Caution: Chemotherapy.    . METFORMIN HCL 1000 MG PO TABS Oral Take 2,000 mg by mouth 2 (two) times daily with a meal.      . PREDNISONE 5 MG PO TABS Oral Take 17.5 mg by mouth every morning. Pt takes 3 and a half 5mg   Tablets  for 17.5mg  dose.    . TRAZODONE HCL 150 MG PO TABS Oral Take 150 mg by mouth at bedtime.      . VENLAFAXINE HCL ER 150 MG PO CP24 Oral Take 150 mg by mouth daily.        BP 114/77  Pulse 86  Temp 98.3 F (36.8 C) (Oral)  Resp 16  SpO2 99%  Physical Exam    Vitals reviewed. Constitutional: She is oriented to person, place, and time. Vital signs are normal. She appears well-developed and well-nourished.  HENT:  Head: Normocephalic and atraumatic.  Eyes: Conjunctivae are normal. Pupils are equal, round, and reactive to light.  Neck: Normal range of motion. Neck supple.  Cardiovascular: Normal rate, regular rhythm and normal heart sounds.  Exam reveals no friction rub.   No murmur heard. Pulmonary/Chest: Effort normal and breath sounds normal. She has no wheezes. She has no rhonchi. She has no rales. She exhibits no tenderness.  Abdominal: Soft. Normal appearance and bowel sounds are normal. There is no hepatosplenomegaly. There is tenderness in the suprapubic area. There is no rigidity, no rebound, no guarding, no CVA tenderness, no tenderness at McBurney's point and negative Murphy's sign.  Genitourinary: Uterus is tender. Cervix exhibits motion tenderness. Right adnexum displays no tenderness. Left adnexum displays no tenderness. No bleeding around the vagina. Vaginal discharge (thin malodorous, Yellowish, white  discharge) found.  Musculoskeletal: Normal range of motion.  Neurological: She is alert and oriented to person, place, and time. Coordination normal.  Skin: Skin is warm and dry. No rash noted. No erythema. No pallor.    ED Course  Procedures Results for orders placed during the hospital encounter of 03/04/12  WET PREP, GENITAL      Component Value Range   Yeast Wet Prep HPF POC NONE SEEN  NONE SEEN   Trich, Wet Prep NONE SEEN  NONE SEEN   Clue Cells Wet Prep HPF POC FEW (*) NONE SEEN   WBC, Wet Prep HPF POC FEW (*) NONE SEEN  URINALYSIS, ROUTINE W REFLEX MICROSCOPIC      Component Value Range   Color, Urine YELLOW  YELLOW   APPearance CLEAR  CLEAR   Specific Gravity, Urine 1.024  1.005 - 1.030   pH 5.5  5.0 - 8.0   Glucose, UA NEGATIVE  NEGATIVE mg/dL   Hgb urine dipstick NEGATIVE  NEGATIVE   Bilirubin Urine NEGATIVE   NEGATIVE   Ketones, ur NEGATIVE  NEGATIVE mg/dL   Protein, ur NEGATIVE  NEGATIVE mg/dL   Urobilinogen, UA 0.2  0.0 - 1.0 mg/dL   Nitrite NEGATIVE  NEGATIVE   Leukocytes, UA NEGATIVE  NEGATIVE    MDM   Since patient has CMT and yellowish d/c with treat for Cervicitis. Patient agrees with plan and requests diflucan for after ABX due to yeast infections after antibiotics.  Thomasene Lot, PA-C 03/04/12 1214

## 2012-03-04 NOTE — ED Notes (Signed)
Pt diagnosis with bacterial vaginosis x 1 month ago at [facility] hospital, sts it is clearing up but been experiencing yellow chalky vaginal discharge x 1 week, itchy, fish odor, accompany by dysuria and odorous urine. Sexually active, 1 partner. Hx of Diabetes.

## 2012-03-05 LAB — URINE CULTURE
Colony Count: NO GROWTH
Culture: NO GROWTH

## 2012-03-20 ENCOUNTER — Other Ambulatory Visit: Payer: Self-pay | Admitting: Internal Medicine

## 2012-03-29 ENCOUNTER — Encounter (HOSPITAL_COMMUNITY): Payer: Self-pay | Admitting: Emergency Medicine

## 2012-03-29 ENCOUNTER — Emergency Department (HOSPITAL_COMMUNITY)
Admission: EM | Admit: 2012-03-29 | Discharge: 2012-03-29 | Discharge status: Absent without leave | Payer: Medicaid Other | Source: Home / Self Care

## 2012-03-29 ENCOUNTER — Emergency Department (INDEPENDENT_AMBULATORY_CARE_PROVIDER_SITE_OTHER)
Admission: EM | Admit: 2012-03-29 | Discharge: 2012-03-29 | Disposition: A | Discharge status: Home / Self Care | Payer: Medicaid Other | Source: Home / Self Care

## 2012-03-29 DIAGNOSIS — R300 Dysuria: Secondary | ICD-10-CM

## 2012-03-29 DIAGNOSIS — N898 Other specified noninflammatory disorders of vagina: Secondary | ICD-10-CM

## 2012-03-29 LAB — POCT URINALYSIS DIP (DEVICE)
Bilirubin Urine: NEGATIVE
Ketones, ur: NEGATIVE mg/dL
Leukocytes, UA: NEGATIVE
Nitrite: NEGATIVE
Protein, ur: NEGATIVE mg/dL
pH: 5.5 (ref 5.0–8.0)

## 2012-03-29 LAB — WET PREP, GENITAL
Clue Cells Wet Prep HPF POC: NONE SEEN
Trich, Wet Prep: NONE SEEN
WBC, Wet Prep HPF POC: NONE SEEN

## 2012-03-29 NOTE — ED Notes (Signed)
INSTRUCTED PATIENT TO PLACE ON GOWN FOR EXAM, PELVIC EQUIPMENT AT BEDSIDE

## 2012-03-29 NOTE — ED Provider Notes (Signed)
Medical screening examination/treatment/procedure(s) were performed by non-physician practitioner and as supervising physician I was immediately available for consultation/collaboration.  Raynald Blend, MD 03/29/12 1714

## 2012-03-29 NOTE — ED Notes (Signed)
Reports vaginal discharge for 3 days.  Reports light yellow discharge .  Reports a new partner recently.  Reports condom broke a week ago.  Reports low abdominal pain

## 2012-03-29 NOTE — ED Provider Notes (Signed)
History     CSN: [id number]  Arrival date & time 03/29/12  1350   None     Chief Complaint  Patient presents with  . Vaginal Discharge    (Consider location/radiation/quality/duration/timing/severity/associated sxs/prior treatment) Patient is a 46 y.o. female presenting with vaginal discharge. The history is provided by the patient.  Vaginal Discharge  46 y.o. female complains of yellow irritating vaginal discharge for three days.  Denies abnormal vaginal bleeding, significant pelvic pain or fever. +dyuria symptoms. Sexually active, new partner one week ago, condom broke during intercourse.  Denies history of known exposure to STD or symptoms in partner.  Patient's last menstrual period was 02/27/2012.  History of gonorrhea 30 years ago.   Past Medical History  Diagnosis Date  . Asthma   . Diabetes mellitus   . Gout   . High cholesterol   . Hypertension   . Hepatitis B infection   . Anxiety   . Depression   . Bipolar 1 disorder   . Liver cirrhosis     Past Surgical History  Procedure Date  . Cesarean section   . Tubal ligation   . Laparoscopy   . Biopsy of liver     Family History  Problem Relation Age of Onset  . Other Neg Hx     History  Substance Use Topics  . Smoking status: Current Everyday Smoker -- 0.2 packs/day  . Smokeless tobacco: Not on file  . Alcohol Use: No     ocassional    OB History    Grav Para Term Preterm Abortions TAB SAB Ect Mult Living   6 4 0 4 2 0 2  0 4      Review of Systems  Constitutional: Negative.   HENT: Negative.   Respiratory: Negative.   Cardiovascular: Negative.   Gastrointestinal: Negative.   Genitourinary: Positive for dysuria, urgency, frequency, vaginal discharge and dyspareunia. Negative for hematuria, flank pain, decreased urine volume, vaginal bleeding, difficulty urinating, vaginal pain, menstrual problem and pelvic pain.  Hematological: Negative for adenopathy.    Allergies  Tylenol  Home  Medications   Current Outpatient Rx  Name Route Sig Dispense Refill  . ALBUTEROL SULFATE HFA 108 (90 BASE) MCG/ACT IN AERS Inhalation Inhale 2 puffs into the lungs every 6 (six) hours as needed. For wheeze or shortness of breath    . DEXILANT PO Oral Take 1 tablet by mouth daily. Samples given by Dr. Debarah Crape    . DIPHENHYDRAMINE HCL 25 MG PO TABS Oral Take 25 mg by mouth every 6 (six) hours as needed. itching    . OMEGA-3 FATTY ACIDS 1000 MG PO CAPS Oral Take 1 g by mouth daily.      Marland Kitchen HYDROXYZINE HCL 25 MG PO TABS Oral Take 25 mg by mouth 3 (three) times daily as needed. Itching      . IBUPROFEN 800 MG PO TABS Oral Take 800 mg by mouth every 6 (six) hours as needed. For  pain    . INSULIN LISPRO (HUMAN) 100 UNIT/ML [place] SOLN Subcutaneous Inject 2-5 Units into the skin 3 (three) times daily before meals. Sliding scale uses with humilin N .     . INSULIN REGULAR HUMAN 100 UNIT/ML [place] SOLN Subcutaneous Inject 4-11 Units into the skin 3 (three) times daily before meals. Sliding scale uses with humalog as well      . MERCAPTOPURINE 50 MG PO TABS Oral Take 75 mg by mouth daily. Give on an empty stomach 1 hour before  or 2 hours after meals. Caution: Chemotherapy.    . METFORMIN HCL 1000 MG PO TABS Oral Take 2,000 mg by mouth 2 (two) times daily with a meal.      . PREDNISONE 5 MG PO TABS Oral Take 17.5 mg by mouth every morning. Pt takes 3 and a half 5mg   Tablets  for 17.5mg  dose.    . TRAZODONE HCL 150 MG PO TABS Oral Take 150 mg by mouth at bedtime.      . VENLAFAXINE HCL ER 150 MG PO CP24 Oral Take 150 mg by mouth daily.        BP 118/70  Pulse 92  Temp 98.4 F (36.9 C) (Oral)  Resp 18  SpO2 100%  LMP 02/27/2012  Physical Exam  Nursing note and vitals reviewed. Constitutional: She is oriented to person, place, and time. Vital signs are normal. She appears well-developed and well-nourished. She is active and cooperative.  HENT:  Head: Normocephalic.  Mouth/Throat: Uvula is midline,  oropharynx is clear and moist and mucous membranes are normal.  Eyes: Conjunctivae are normal. Pupils are equal, round, and reactive to light. No scleral icterus.  Neck: Trachea normal. Neck supple.  Cardiovascular: Normal rate, regular rhythm, normal heart sounds, intact distal pulses and normal pulses.   Pulmonary/Chest: Effort normal and breath sounds normal.  Abdominal: Soft. Normal appearance and bowel sounds are normal. There is no hepatosplenomegaly. There is tenderness in the suprapubic area. There is no CVA tenderness.  Genitourinary: Uterus normal. Pelvic exam was performed with patient supine. There is no rash, tenderness or lesion on the right labia. There is no rash, tenderness or lesion on the left labia. Cervix exhibits no motion tenderness, no discharge and no friability. Right adnexum displays no mass, no tenderness and no fullness. Left adnexum displays no mass, no tenderness and no fullness. No erythema or tenderness around the vagina. No signs of injury around the vagina. Vaginal discharge found.       White clear discharge, thin  Lymphadenopathy:    She has no cervical adenopathy.       Right: No inguinal adenopathy present.       Left: No inguinal adenopathy present.  Neurological: She is alert and oriented to person, place, and time. No cranial nerve deficit or sensory deficit.  Skin: Skin is warm and dry.  Psychiatric: She has a normal mood and affect. Her speech is normal and behavior is normal. Judgment and thought content normal. Cognition and memory are normal.    ED Course  Procedures (including critical care time)  Labs Reviewed  POCT URINALYSIS DIP (DEVICE) - Abnormal; Notable for the following:    Glucose, UA 500 (*)     All other components within normal limits  WET PREP, GENITAL  GC/CHLAMYDIA PROBE AMP, GENITAL  RPR   No results found.   1. Vaginal Discharge   2. Dysuria       MDM  Await GC and RPR results.  Wet prep normal, Urine + for glucose,  follow up with your primary care provider for diabetes.  Condoms for STD prevention.          Johnsie Kindred, NP 03/29/12 1554

## 2012-04-01 ENCOUNTER — Telehealth (HOSPITAL_COMMUNITY): Payer: Self-pay | Admitting: *Deleted

## 2012-04-01 NOTE — ED Notes (Signed)
Pt. called for her lab results.  Pt. verified x 2 and given results.  All were negative.  Pt. Said she is allergic to latex and does not trust her partner.  Pt.'s questions answered. Vassie Moselle 04/01/2012

## 2012-08-21 ENCOUNTER — Emergency Department (HOSPITAL_COMMUNITY): Payer: Medicaid Other

## 2012-08-21 ENCOUNTER — Emergency Department (HOSPITAL_COMMUNITY)
Admission: EM | Admit: 2012-08-21 | Discharge: 2012-08-21 | Disposition: A | Discharge status: Home / Self Care | Payer: Medicaid Other | Attending: Emergency Medicine | Admitting: Emergency Medicine

## 2012-08-21 ENCOUNTER — Encounter (HOSPITAL_COMMUNITY): Payer: Self-pay | Admitting: Emergency Medicine

## 2012-08-21 DIAGNOSIS — R059 Cough, unspecified: Secondary | ICD-10-CM | POA: Insufficient documentation

## 2012-08-21 DIAGNOSIS — Z8619 Personal history of other infectious and parasitic diseases: Secondary | ICD-10-CM | POA: Insufficient documentation

## 2012-08-21 DIAGNOSIS — E7800 Pure hypercholesterolemia, unspecified: Secondary | ICD-10-CM | POA: Insufficient documentation

## 2012-08-21 DIAGNOSIS — J45909 Unspecified asthma, uncomplicated: Secondary | ICD-10-CM | POA: Insufficient documentation

## 2012-08-21 DIAGNOSIS — R05 Cough: Secondary | ICD-10-CM | POA: Insufficient documentation

## 2012-08-21 DIAGNOSIS — F329 Major depressive disorder, single episode, unspecified: Secondary | ICD-10-CM | POA: Insufficient documentation

## 2012-08-21 DIAGNOSIS — J40 Bronchitis, not specified as acute or chronic: Secondary | ICD-10-CM | POA: Insufficient documentation

## 2012-08-21 DIAGNOSIS — R197 Diarrhea, unspecified: Secondary | ICD-10-CM | POA: Insufficient documentation

## 2012-08-21 DIAGNOSIS — Z8719 Personal history of other diseases of the digestive system: Secondary | ICD-10-CM | POA: Insufficient documentation

## 2012-08-21 DIAGNOSIS — I10 Essential (primary) hypertension: Secondary | ICD-10-CM | POA: Insufficient documentation

## 2012-08-21 DIAGNOSIS — F3289 Other specified depressive episodes: Secondary | ICD-10-CM | POA: Insufficient documentation

## 2012-08-21 DIAGNOSIS — Z794 Long term (current) use of insulin: Secondary | ICD-10-CM | POA: Insufficient documentation

## 2012-08-21 DIAGNOSIS — Z79899 Other long term (current) drug therapy: Secondary | ICD-10-CM | POA: Insufficient documentation

## 2012-08-21 DIAGNOSIS — F319 Bipolar disorder, unspecified: Secondary | ICD-10-CM | POA: Insufficient documentation

## 2012-08-21 DIAGNOSIS — F17200 Nicotine dependence, unspecified, uncomplicated: Secondary | ICD-10-CM | POA: Insufficient documentation

## 2012-08-21 DIAGNOSIS — E119 Type 2 diabetes mellitus without complications: Secondary | ICD-10-CM | POA: Insufficient documentation

## 2012-08-21 DIAGNOSIS — F411 Generalized anxiety disorder: Secondary | ICD-10-CM | POA: Insufficient documentation

## 2012-08-21 DIAGNOSIS — Z8739 Personal history of other diseases of the musculoskeletal system and connective tissue: Secondary | ICD-10-CM | POA: Insufficient documentation

## 2012-08-21 DIAGNOSIS — R0789 Other chest pain: Secondary | ICD-10-CM | POA: Insufficient documentation

## 2012-08-21 LAB — WET PREP, GENITAL
Trich, Wet Prep: NONE SEEN
Yeast Wet Prep HPF POC: NONE SEEN

## 2012-08-21 LAB — RPR: RPR Ser Ql: NONREACTIVE

## 2012-08-21 MED ORDER — PROMETHAZINE-DM 6.25-15 MG/5ML PO SYRP: 5.0000 mL | ORAL_SOLUTION | Freq: Four times a day (QID) | ORAL | Status: DC | PRN

## 2012-08-21 MED ORDER — GUAIFENESIN ER 1200 MG PO TB12: 1.0000 | ORAL_TABLET | Freq: Two times a day (BID) | ORAL | Status: DC

## 2012-08-21 MED ORDER — METRONIDAZOLE 500 MG PO TABS: 500.0000 mg | ORAL_TABLET | Freq: Two times a day (BID) | ORAL | Status: DC

## 2012-08-21 MED ORDER — ALBUTEROL SULFATE (5 MG/ML) 0.5% IN NEBU
5.0000 mg | INHALATION_SOLUTION | Freq: Once | RESPIRATORY_TRACT | Status: AC
  Administered 2012-08-21: 5 mg via RESPIRATORY_TRACT
  Filled 2012-08-21: qty 1

## 2012-08-21 MED ORDER — IPRATROPIUM BROMIDE 0.02 % IN SOLN
0.5000 mg | Freq: Once | RESPIRATORY_TRACT | Status: AC
  Administered 2012-08-21: 0.5 mg via RESPIRATORY_TRACT
  Filled 2012-08-21: qty 2.5

## 2012-08-21 NOTE — ED Notes (Signed)
States that she has a progressive cough for the past week. States that she has begin to feel tight in her chest. Also states that she has had a vaginal discharge with odor for 1 week.

## 2012-08-21 NOTE — ED Notes (Signed)
Patient transported to X-ray 

## 2012-08-21 NOTE — ED Provider Notes (Signed)
History     CSN: [id number]  Arrival date & time 08/21/12  0714   First MD Initiated Contact with Patient 08/21/12 [phone]      Chief Complaint  Patient presents with  . Vaginal Discharge  . Cough    (Consider location/radiation/quality/duration/timing/severity/associated sxs/prior treatment) Patient is a 47 y.o. female presenting with vaginal discharge and cough.  Vaginal Discharge Associated symptoms include coughing.  Cough   Patient presents to the emergency department complaining of chest tightness, worsening cough, and vaginal discharge with irritation and odor of one week duration. She states that her cough is productive and occasionally makes her gag. She has a history of asthma, and has used her albuterol inhaler with minimal relief of chest tightness. She smokes about 3 cigarettes a day, down from 3 packs/day. She also states that she had diarrhea last night. Denies hemoptysis, respiratory distress, and chest pain.   Patient also presents today with vaginal discharge with irritation. This has happened before. Patient is diabetic and gets frequent yeast infections, and also has a history of BV. She is sexually active with 1 female partner, but she does not know if he is monogamous. She requests STD screening, specifically for syphilis. Denies dysuria, urinary frequency, pelvic pain, and hematuria.   Past Medical History  Diagnosis Date  . Asthma   . Diabetes mellitus   . Gout   . High cholesterol   . Hypertension   . Hepatitis B infection   . Anxiety   . Depression   . Bipolar 1 disorder   . Liver cirrhosis     Past Surgical History  Procedure Date  . Cesarean section   . Tubal ligation   . Laparoscopy   . Biopsy of liver     Family History  Problem Relation Age of Onset  . Other Neg Hx     History  Substance Use Topics  . Smoking status: Current Every Day Smoker -- 0.2 packs/day  . Smokeless tobacco: Not on file  . Alcohol Use: No     Comment: ocassional      OB History    Grav Para Term Preterm Abortions TAB SAB Ect Mult Living   6 4 0 4 2 0 2  0 4      Review of Systems  Respiratory: Positive for cough.   Genitourinary: Positive for vaginal discharge.   All other systems negative except as documented in the HPI. All pertinent positives and negatives as reviewed in the HPI.   Allergies  Tylenol  Home Medications   Current Outpatient Rx  Name  Route  Sig  Dispense  Refill  . ALBUTEROL SULFATE HFA 108 (90 BASE) MCG/ACT IN AERS   Inhalation   Inhale 2 puffs into the lungs every 6 (six) hours as needed. For wheeze or shortness of breath         . DEXILANT PO   Oral   Take 1 tablet by mouth daily. Samples given by Dr. Debarah Crape         . DIPHENHYDRAMINE HCL 25 MG PO TABS   Oral   Take 25 mg by mouth every 6 (six) hours as needed. itching         . OMEGA-3 FATTY ACIDS 1000 MG PO CAPS   Oral   Take 1 g by mouth daily.           Marland Kitchen HYDROXYZINE HCL 25 MG PO TABS   Oral   Take 25 mg by mouth 3 (three) times  daily as needed. Itching           . IBUPROFEN 800 MG PO TABS   Oral   Take 800 mg by mouth every 6 (six) hours as needed. For  pain         . INSULIN LISPRO (HUMAN) 100 UNIT/ML [place] SOLN   Subcutaneous   Inject 2-5 Units into the skin 3 (three) times daily before meals. Sliding scale uses with humilin N .          . INSULIN REGULAR HUMAN 100 UNIT/ML IJ SOLN   Subcutaneous   Inject 4-11 Units into the skin 3 (three) times daily before meals. Sliding scale uses with humalog as well           . MERCAPTOPURINE 50 MG PO TABS   Oral   Take 75 mg by mouth daily. Give on an empty stomach 1 hour before or 2 hours after meals. Caution: Chemotherapy.         . METFORMIN HCL 1000 MG PO TABS   Oral   Take 1,000 mg by mouth 2 (two) times daily with a meal.          . PREDNISONE 5 MG PO TABS   Oral   Take 17.5 mg by mouth every morning. Pt takes 3 and a half 5mg   Tablets  for 17.5mg  dose.         .  TRAZODONE HCL 150 MG PO TABS   Oral   Take 150 mg by mouth at bedtime.           . VENLAFAXINE HCL ER 150 MG PO CP24   Oral   Take 150 mg by mouth daily.             BP 121/78  Pulse 92  Temp 98.1 F (36.7 C) (Oral)  Resp 16  Ht 5\' 1"  (1.549 m)  Wt 218 lb 9.6 oz (99.156 kg)  BMI 41.30 kg/m2  SpO2 100%  LMP 08/14/2012  Physical Exam  Constitutional: She is oriented to person, place, and time. She appears well-developed and well-nourished. She is cooperative.  HENT:  Head: Normocephalic.  Neck: No tracheal deviation present.  Cardiovascular: Normal rate and regular rhythm.   No murmur heard. Pulmonary/Chest: Effort normal. No stridor. No respiratory distress. She has no wheezes. She has no rales. She exhibits no tenderness.  Abdominal: Soft. There is no tenderness.  Genitourinary: There is no lesion on the right labia. There is no lesion on the left labia. Uterus is not tender. Cervix exhibits discharge. Cervix exhibits no motion tenderness and no friability. Vaginal discharge found.       Cervical and vaginal discharge light brown and odorous.  Lymphadenopathy:    She has no cervical adenopathy.  Neurological: She is alert and oriented to person, place, and time.  Skin: Skin is warm and dry.     ED Course  Procedures (including critical care time)  The patient is treated for BV and will be treated for her cough/asthmatic symptoms. The patient is feeling better following treatment. The patient is advised to follow up with her PCP. Told to return here as needed for any worsening in her condition.    MDM          Carlyle Dolly, PA-C 08/22/12 [phone]

## 2012-08-22 LAB — GC/CHLAMYDIA PROBE AMP
CT Probe RNA: NEGATIVE
GC Probe RNA: NEGATIVE

## 2012-08-23 NOTE — ED Provider Notes (Signed)
Medical screening examination/treatment/procedure(s) were performed by non-physician practitioner and as supervising physician I was immediately available for consultation/collaboration.    Arien Benincasa L Negin Hegg, MD 08/23/12 1052 

## 2012-10-01 ENCOUNTER — Encounter (HOSPITAL_COMMUNITY): Payer: Self-pay | Admitting: Emergency Medicine

## 2012-10-01 ENCOUNTER — Emergency Department (HOSPITAL_COMMUNITY)
Admission: EM | Admit: 2012-10-01 | Discharge: 2012-10-01 | Disposition: A | Discharge status: Home / Self Care | Payer: Medicaid Other | Attending: Emergency Medicine | Admitting: Emergency Medicine

## 2012-10-01 DIAGNOSIS — E669 Obesity, unspecified: Secondary | ICD-10-CM | POA: Insufficient documentation

## 2012-10-01 DIAGNOSIS — F319 Bipolar disorder, unspecified: Secondary | ICD-10-CM | POA: Insufficient documentation

## 2012-10-01 DIAGNOSIS — F17200 Nicotine dependence, unspecified, uncomplicated: Secondary | ICD-10-CM | POA: Insufficient documentation

## 2012-10-01 DIAGNOSIS — M79609 Pain in unspecified limb: Secondary | ICD-10-CM | POA: Insufficient documentation

## 2012-10-01 DIAGNOSIS — L309 Dermatitis, unspecified: Secondary | ICD-10-CM

## 2012-10-01 DIAGNOSIS — Z113 Encounter for screening for infections with a predominantly sexual mode of transmission: Secondary | ICD-10-CM

## 2012-10-01 DIAGNOSIS — L259 Unspecified contact dermatitis, unspecified cause: Secondary | ICD-10-CM | POA: Insufficient documentation

## 2012-10-01 DIAGNOSIS — Z79899 Other long term (current) drug therapy: Secondary | ICD-10-CM | POA: Insufficient documentation

## 2012-10-01 DIAGNOSIS — J45909 Unspecified asthma, uncomplicated: Secondary | ICD-10-CM | POA: Insufficient documentation

## 2012-10-01 DIAGNOSIS — N898 Other specified noninflammatory disorders of vagina: Secondary | ICD-10-CM | POA: Insufficient documentation

## 2012-10-01 DIAGNOSIS — E119 Type 2 diabetes mellitus without complications: Secondary | ICD-10-CM | POA: Insufficient documentation

## 2012-10-01 DIAGNOSIS — Z862 Personal history of diseases of the blood and blood-forming organs and certain disorders involving the immune mechanism: Secondary | ICD-10-CM | POA: Insufficient documentation

## 2012-10-01 DIAGNOSIS — I10 Essential (primary) hypertension: Secondary | ICD-10-CM | POA: Insufficient documentation

## 2012-10-01 DIAGNOSIS — Z3202 Encounter for pregnancy test, result negative: Secondary | ICD-10-CM | POA: Insufficient documentation

## 2012-10-01 DIAGNOSIS — F411 Generalized anxiety disorder: Secondary | ICD-10-CM | POA: Insufficient documentation

## 2012-10-01 DIAGNOSIS — Z794 Long term (current) use of insulin: Secondary | ICD-10-CM | POA: Insufficient documentation

## 2012-10-01 DIAGNOSIS — Z8619 Personal history of other infectious and parasitic diseases: Secondary | ICD-10-CM | POA: Insufficient documentation

## 2012-10-01 DIAGNOSIS — Z8719 Personal history of other diseases of the digestive system: Secondary | ICD-10-CM | POA: Insufficient documentation

## 2012-10-01 DIAGNOSIS — Z8639 Personal history of other endocrine, nutritional and metabolic disease: Secondary | ICD-10-CM | POA: Insufficient documentation

## 2012-10-01 LAB — URINALYSIS, ROUTINE W REFLEX MICROSCOPIC
Bilirubin Urine: NEGATIVE
Glucose, UA: NEGATIVE mg/dL
Nitrite: NEGATIVE
Specific Gravity, Urine: 1.012 (ref 1.005–1.030)
pH: 6 (ref 5.0–8.0)

## 2012-10-01 LAB — WET PREP, GENITAL
Trich, Wet Prep: NONE SEEN
Yeast Wet Prep HPF POC: NONE SEEN

## 2012-10-01 LAB — RPR: RPR Ser Ql: NONREACTIVE

## 2012-10-01 MED ORDER — HYDROCORTISONE 2.5 % EX OINT: TOPICAL_OINTMENT | Freq: Two times a day (BID) | CUTANEOUS | Status: DC

## 2012-10-01 MED ORDER — FLUCONAZOLE 100 MG PO TABS
150.0000 mg | ORAL_TABLET | Freq: Once | ORAL | Status: AC
  Administered 2012-10-01: 150 mg via ORAL
  Filled 2012-10-01: qty 2

## 2012-10-01 NOTE — ED Notes (Signed)
Checked patient blood sugar it was 95 notified RN Lori of blood sugar

## 2012-10-01 NOTE — ED Notes (Signed)
Pt c/o vaginal discharge with odor and foot pain from gout

## 2012-10-01 NOTE — ED Provider Notes (Signed)
I have personally seen and examined the patient.  I have discussed the plan of care with the resident.  I have reviewed the documentation on PMH/FH/Soc. History.  I have reviewed the documentation of the resident and agree.   Joya Gaskins, MD 10/01/12 [phone]

## 2012-10-01 NOTE — ED Provider Notes (Signed)
History     CSN: [id number]  Arrival date & time 10/01/12  1258   First MD Initiated Contact with Patient 10/01/12 1412      Chief Complaint  Patient presents with  . Vaginal Discharge  . Foot Pain    (Consider location/radiation/quality/duration/timing/severity/associated sxs/prior treatment) HPI Comments: 47 y.o PMH asthma, DM uncontrolled, gout, dyslipidemia, HTN, hepatitis B, depression, bipolar, liver cirrhosis  Presents for vaginal discharge light yellow, she feels like her vaginal area is on "fire", burning.  She tried Monostat without relief.  She wants to be check for STD because she has been sexually active with her partner of 16 years without protection recently but does not trust him.    She complains of right foot pain with history of gout but the swelling is not as much as her previous gout flare.  She complains of cracking to her b/l hands and feet.    PCP: Dr. Clemon Chambers MD [phone]   Patient is a 47 y.o. female presenting with vaginal discharge. The history is provided by the patient. No language interpreter was used.  Vaginal Discharge This is a recurrent problem.    Past Medical History  Diagnosis Date  . Asthma   . Diabetes mellitus   . Gout   . High cholesterol   . Hypertension   . Hepatitis B infection   . Anxiety   . Depression   . Bipolar 1 disorder   . Liver cirrhosis     Past Surgical History  Procedure Laterality Date  . Cesarean section    . Tubal ligation    . Laparoscopy    . Biopsy of liver      Family History  Problem Relation Age of Onset  . Other Neg Hx     History  Substance Use Topics  . Smoking status: Current Every Day Smoker -- 0.25 packs/day  . Smokeless tobacco: Not on file  . Alcohol Use: No     Comment: ocassional    OB History   Grav Para Term Preterm Abortions TAB SAB Ect Mult Living   6 4 0 4 2 0 2  0 4      Review of Systems  Genitourinary: Positive for vaginal discharge. Negative for  vaginal bleeding.       Vaginal d/c     Allergies  Tylenol  Home Medications   Current Outpatient Rx  Name  Route  Sig  Dispense  Refill  . albuterol (PROVENTIL HFA;VENTOLIN HFA) 108 (90 BASE) MCG/ACT inhaler   Inhalation   Inhale 2 puffs into the lungs every 6 (six) hours as needed. For wheeze or shortness of breath         . Dexlansoprazole (DEXILANT PO)   Oral   Take 1 tablet by mouth daily. Samples given by Dr. Debarah Crape         . diphenhydrAMINE (BENADRYL) 25 MG tablet   Oral   Take 25 mg by mouth every 6 (six) hours as needed. itching         . fish oil-omega-3 fatty acids 1000 MG capsule   Oral   Take 1 g by mouth daily.           . Guaifenesin 1200 MG TB12   Oral   Take 1 tablet (1,200 mg total) by mouth 2 (two) times daily.   20 each   0   . hydrOXYzine (ATARAX/VISTARIL) 25 MG tablet   Oral   Take 25 mg by mouth  3 (three) times daily as needed. Itching           . ibuprofen (ADVIL,MOTRIN) 800 MG tablet   Oral   Take 800 mg by mouth every 6 (six) hours as needed. For  pain         . insulin lispro (HUMALOG) 100 UNIT/ML injection   Subcutaneous   Inject 2-5 Units into the skin 3 (three) times daily before meals. Sliding scale uses with humilin N .         . insulin regular (NOVOLIN R,HUMULIN R) 100 units/mL injection   Subcutaneous   Inject 4-11 Units into the skin 3 (three) times daily before meals. Sliding scale uses with humalog as well           . mercaptopurine (PURINETHOL) 50 MG tablet   Oral   Take 75 mg by mouth daily. Give on an empty stomach 1 hour before or 2 hours after meals. Caution: Chemotherapy.         . metFORMIN (GLUCOPHAGE) 1000 MG tablet   Oral   Take 2,000 mg by mouth 2 (two) times daily with a meal.          . predniSONE (DELTASONE) 5 MG tablet   Oral   Take 20 mg by mouth every morning.          . traZODone (DESYREL) 150 MG tablet   Oral   Take 150 mg by mouth at bedtime.          Marland Kitchen venlafaxine  (EFFEXOR-XR) 150 MG 24 hr capsule   Oral   Take 150 mg by mouth every evening.            BP 122/80  Pulse 98  Temp(Src) 97.9 F (36.6 C) (Oral)  Resp 18  SpO2 99%  Physical Exam  Nursing note and vitals reviewed. Constitutional: She is oriented to person, place, and time. Vital signs are normal. She appears well-developed and well-nourished. She is cooperative. No distress.  HENT:  Head: Normocephalic and atraumatic.  Mouth/Throat: Mucous membranes are normal. No oropharyngeal exudate.  Eyes: Conjunctivae are normal. Pupils are equal, round, and reactive to light. Right eye exhibits no discharge. Left eye exhibits no discharge. No scleral icterus.  Cardiovascular: Normal rate, regular rhythm, S1 normal, S2 normal and normal heart sounds.   Pulmonary/Chest: Effort normal and breath sounds normal. No respiratory distress. She has no wheezes.  Abdominal: Soft. Normal appearance and bowel sounds are normal. She exhibits no distension. There is no tenderness.  Obese abdomen  Genitourinary: Pelvic exam was performed with patient supine. There is no lesion on the right labia. There is no lesion on the left labia. Cervix exhibits discharge. Cervix exhibits no motion tenderness and no friability.  copius white thin discharge   Musculoskeletal: She exhibits no edema.  No significant edema b/l ankles   Neurological: She is alert and oriented to person, place, and time. She has normal strength.  Skin: Skin is warm and dry. No rash noted. She is not diaphoretic.  Eczematous changes to b/l hands and feet  Psychiatric: She has a normal mood and affect. Her speech is normal and behavior is normal. Judgment and thought content normal. Cognition and memory are normal.    ED Course  Procedures (including critical care time)  Labs Reviewed  WET PREP, GENITAL  GC/CHLAMYDIA PROBE AMP  URINALYSIS, ROUTINE W REFLEX MICROSCOPIC  RPR  POCT PREGNANCY, URINE   No results found.   1. Eczema of  hand  2. Screen for STD (sexually transmitted disease)   3. Vaginal discharge       MDM  RPR, GC/C, wet prep  Will give instructions for hand and foot ezcema F/u with PCP  Shirlee Latch MD [phone]         Annett Gula, MD 10/01/12 [phone]

## 2013-03-22 ENCOUNTER — Emergency Department (HOSPITAL_COMMUNITY)
Admission: EM | Admit: 2013-03-22 | Discharge: 2013-03-23 | Disposition: A | Discharge status: Home / Self Care | Payer: Medicaid Other | Attending: Emergency Medicine | Admitting: Emergency Medicine

## 2013-03-22 ENCOUNTER — Encounter (HOSPITAL_COMMUNITY): Payer: Self-pay | Admitting: *Deleted

## 2013-03-22 DIAGNOSIS — E119 Type 2 diabetes mellitus without complications: Secondary | ICD-10-CM | POA: Insufficient documentation

## 2013-03-22 DIAGNOSIS — F29 Unspecified psychosis not due to a substance or known physiological condition: Secondary | ICD-10-CM | POA: Insufficient documentation

## 2013-03-22 DIAGNOSIS — N898 Other specified noninflammatory disorders of vagina: Secondary | ICD-10-CM | POA: Insufficient documentation

## 2013-03-22 DIAGNOSIS — F32A Depression, unspecified: Secondary | ICD-10-CM

## 2013-03-22 DIAGNOSIS — Z794 Long term (current) use of insulin: Secondary | ICD-10-CM | POA: Insufficient documentation

## 2013-03-22 DIAGNOSIS — I10 Essential (primary) hypertension: Secondary | ICD-10-CM | POA: Insufficient documentation

## 2013-03-22 DIAGNOSIS — Z8719 Personal history of other diseases of the digestive system: Secondary | ICD-10-CM | POA: Insufficient documentation

## 2013-03-22 DIAGNOSIS — M109 Gout, unspecified: Secondary | ICD-10-CM | POA: Insufficient documentation

## 2013-03-22 DIAGNOSIS — F17200 Nicotine dependence, unspecified, uncomplicated: Secondary | ICD-10-CM | POA: Insufficient documentation

## 2013-03-22 DIAGNOSIS — R1011 Right upper quadrant pain: Secondary | ICD-10-CM | POA: Insufficient documentation

## 2013-03-22 DIAGNOSIS — F411 Generalized anxiety disorder: Secondary | ICD-10-CM | POA: Insufficient documentation

## 2013-03-22 DIAGNOSIS — Z79899 Other long term (current) drug therapy: Secondary | ICD-10-CM | POA: Insufficient documentation

## 2013-03-22 DIAGNOSIS — F319 Bipolar disorder, unspecified: Secondary | ICD-10-CM | POA: Insufficient documentation

## 2013-03-22 DIAGNOSIS — F3289 Other specified depressive episodes: Secondary | ICD-10-CM | POA: Insufficient documentation

## 2013-03-22 DIAGNOSIS — E7800 Pure hypercholesterolemia, unspecified: Secondary | ICD-10-CM | POA: Insufficient documentation

## 2013-03-22 DIAGNOSIS — IMO0002 Reserved for concepts with insufficient information to code with codable children: Secondary | ICD-10-CM | POA: Insufficient documentation

## 2013-03-22 DIAGNOSIS — F329 Major depressive disorder, single episode, unspecified: Secondary | ICD-10-CM | POA: Insufficient documentation

## 2013-03-22 DIAGNOSIS — Z8619 Personal history of other infectious and parasitic diseases: Secondary | ICD-10-CM | POA: Insufficient documentation

## 2013-03-22 DIAGNOSIS — J45909 Unspecified asthma, uncomplicated: Secondary | ICD-10-CM | POA: Insufficient documentation

## 2013-03-22 DIAGNOSIS — G8929 Other chronic pain: Secondary | ICD-10-CM | POA: Insufficient documentation

## 2013-03-22 LAB — LIPASE, BLOOD: Lipase: 29 U/L (ref 11–59)

## 2013-03-22 LAB — CBC
HCT: 36.1 % (ref 36.0–46.0)
MCH: 33.5 pg (ref 26.0–34.0)
MCV: 94.5 fL (ref 78.0–100.0)
RBC: 3.82 MIL/uL — ABNORMAL LOW (ref 3.87–5.11)
RDW: 15.7 % — ABNORMAL HIGH (ref 11.5–15.5)
WBC: 11.2 10*3/uL — ABNORMAL HIGH (ref 4.0–10.5)

## 2013-03-22 LAB — COMPREHENSIVE METABOLIC PANEL
BUN: 14 mg/dL (ref 6–23)
CO2: 23 mEq/L (ref 19–32)
Calcium: 9.5 mg/dL (ref 8.4–10.5)
Chloride: 101 mEq/L (ref 96–112)
Creatinine, Ser: 0.67 mg/dL (ref 0.50–1.10)
GFR calc Af Amer: >90 mL/min (ref >90)
GFR calc non Af Amer: >90 mL/min (ref >90)
Total Bilirubin: 0.2 mg/dL — ABNORMAL LOW (ref 0.3–1.2)

## 2013-03-22 LAB — ETHANOL: Alcohol, Ethyl (B): 221 mg/dL — ABNORMAL HIGH (ref 0–11)

## 2013-03-22 MED ORDER — MERCAPTOPURINE 50 MG PO TABS: 75.0000 mg | ORAL_TABLET | Freq: Every day | ORAL | Status: DC

## 2013-03-22 MED ORDER — VENLAFAXINE HCL ER 150 MG PO CP24: 150.0000 mg | ORAL_CAPSULE | Freq: Every evening | ORAL | Status: DC

## 2013-03-22 MED ORDER — ADULT MULTIVITAMIN W/MINERALS CH: 1.0000 | ORAL_TABLET | Freq: Every day | ORAL | Status: DC

## 2013-03-22 MED ORDER — THIAMINE HCL 100 MG/ML IJ SOLN: 100.0000 mg | Freq: Every day | INTRAMUSCULAR | Status: DC

## 2013-03-22 MED ORDER — FOLIC ACID 1 MG PO TABS: 1.0000 mg | ORAL_TABLET | Freq: Every day | ORAL | Status: DC

## 2013-03-22 MED ORDER — ALBUTEROL SULFATE HFA 108 (90 BASE) MCG/ACT IN AERS: 2.0000 | INHALATION_SPRAY | Freq: Four times a day (QID) | RESPIRATORY_TRACT | Status: DC | PRN

## 2013-03-22 MED ORDER — METFORMIN HCL 500 MG PO TABS: 2000.0000 mg | ORAL_TABLET | Freq: Two times a day (BID) | ORAL | Status: DC

## 2013-03-22 MED ORDER — IBUPROFEN 400 MG PO TABS: 600.0000 mg | ORAL_TABLET | Freq: Three times a day (TID) | ORAL | Status: DC | PRN

## 2013-03-22 MED ORDER — LORAZEPAM 2 MG/ML IJ SOLN: 1.0000 mg | Freq: Four times a day (QID) | INTRAMUSCULAR | Status: DC | PRN

## 2013-03-22 MED ORDER — TRAZODONE HCL 50 MG PO TABS
150.0000 mg | ORAL_TABLET | Freq: Every day | ORAL | Status: DC
  Administered 2013-03-23: 150 mg via ORAL
  Filled 2013-03-22: qty 3

## 2013-03-22 MED ORDER — LORAZEPAM 1 MG PO TABS: 0.0000 mg | ORAL_TABLET | Freq: Four times a day (QID) | ORAL | Status: DC

## 2013-03-22 MED ORDER — INSULIN ASPART 100 UNIT/ML ~~LOC~~ SOLN: 0.0000 [IU] | Freq: Three times a day (TID) | SUBCUTANEOUS | Status: DC

## 2013-03-22 MED ORDER — INSULIN ASPART 100 UNIT/ML ~~LOC~~ SOLN: 0.0000 [IU] | Freq: Every day | SUBCUTANEOUS | Status: DC

## 2013-03-22 MED ORDER — LORAZEPAM 1 MG PO TABS: 1.0000 mg | ORAL_TABLET | Freq: Four times a day (QID) | ORAL | Status: DC | PRN

## 2013-03-22 MED ORDER — DIPHENHYDRAMINE HCL 25 MG PO TABS: 25.0000 mg | ORAL_TABLET | Freq: Three times a day (TID) | ORAL | Status: DC

## 2013-03-22 MED ORDER — VITAMIN B-1 100 MG PO TABS: 100.0000 mg | ORAL_TABLET | Freq: Every day | ORAL | Status: DC

## 2013-03-22 MED ORDER — LORAZEPAM 1 MG PO TABS: 0.0000 mg | ORAL_TABLET | Freq: Two times a day (BID) | ORAL | Status: DC

## 2013-03-22 MED ORDER — PANTOPRAZOLE SODIUM 40 MG PO TBEC: 40.0000 mg | DELAYED_RELEASE_TABLET | Freq: Every day | ORAL | Status: DC

## 2013-03-22 MED ORDER — PREDNISONE 20 MG PO TABS: 20.0000 mg | ORAL_TABLET | Freq: Every morning | ORAL | Status: DC

## 2013-03-22 MED ORDER — LORAZEPAM 1 MG PO TABS: 1.0000 mg | ORAL_TABLET | Freq: Three times a day (TID) | ORAL | Status: DC | PRN

## 2013-03-22 NOTE — ED Notes (Signed)
House Coverage aware of need for sitter.  
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
## 2013-03-22 NOTE — ED Notes (Signed)
Pt with multiple complaints. States she has been feeling SI, but has no plan. She is currently anxious, and in stress full situation with her family. She had a drink today, but states she is not supposed to. Also c/o SOB, gout pain in feet, irritation and bad odor from genital area. Lung sounds clear, and pt is speaking in full sentences.

## 2013-03-22 NOTE — ED Notes (Signed)
Pt states she drank two 40 oz beers.

## 2013-03-22 NOTE — ED Notes (Signed)
Pt also c/o RUQ abdominal pain. States it feels like it is on fire.

## 2013-03-22 NOTE — ED Notes (Signed)
S/O at the bedside.
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
## 2013-03-23 ENCOUNTER — Telehealth (HOSPITAL_COMMUNITY): Payer: Self-pay | Admitting: Emergency Medicine

## 2013-03-23 LAB — RPR: RPR Ser Ql: NONREACTIVE

## 2013-03-23 LAB — RAPID HIV SCREEN (WH-MAU): Rapid HIV Screen: NONREACTIVE

## 2013-03-23 MED ORDER — DIPHENHYDRAMINE HCL 25 MG PO CAPS: 25.0000 mg | ORAL_CAPSULE | Freq: Three times a day (TID) | ORAL | Status: DC | PRN

## 2013-03-23 MED ORDER — METRONIDAZOLE 500 MG PO TABS
500.0000 mg | ORAL_TABLET | Freq: Two times a day (BID) | ORAL | Status: DC
  Administered 2013-03-23: 500 mg via ORAL
  Filled 2013-03-23: qty 1

## 2013-03-23 NOTE — ED Notes (Signed)
Hannah Young. [facility] called. Stated, "out patient referral; talked with Dr. Hyacinth Meeker." pt. Will be referred to [facility].
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
## 2013-03-23 NOTE — ED Provider Notes (Signed)
According to the behavioral health assessment team by tele-psychiatry the patient is stable for discharge, has no significant suicidal thoughts and is not a danger to herself or others, they have given referral list in the patient has expressed her understanding.  Vida Roller, MD 03/23/13 0130

## 2013-03-24 ENCOUNTER — Telehealth (HOSPITAL_COMMUNITY): Payer: Self-pay | Admitting: Emergency Medicine

## 2013-03-24 NOTE — Telephone Encounter (Signed)
Pt called to get std results, which were given. They were all negative.

## 2013-10-12 ENCOUNTER — Emergency Department (HOSPITAL_COMMUNITY): Payer: Medicare Other

## 2013-10-12 ENCOUNTER — Encounter (HOSPITAL_COMMUNITY): Payer: Self-pay | Admitting: Emergency Medicine

## 2013-10-12 ENCOUNTER — Emergency Department (HOSPITAL_COMMUNITY)
Admission: EM | Admit: 2013-10-12 | Discharge: 2013-10-12 | Disposition: A | Discharge status: Home / Self Care | Payer: Medicare Other | Attending: Emergency Medicine | Admitting: Emergency Medicine

## 2013-10-12 DIAGNOSIS — M25469 Effusion, unspecified knee: Secondary | ICD-10-CM | POA: Insufficient documentation

## 2013-10-12 DIAGNOSIS — I10 Essential (primary) hypertension: Secondary | ICD-10-CM | POA: Insufficient documentation

## 2013-10-12 DIAGNOSIS — Z79899 Other long term (current) drug therapy: Secondary | ICD-10-CM | POA: Insufficient documentation

## 2013-10-12 DIAGNOSIS — J45909 Unspecified asthma, uncomplicated: Secondary | ICD-10-CM | POA: Insufficient documentation

## 2013-10-12 DIAGNOSIS — M25462 Effusion, left knee: Secondary | ICD-10-CM

## 2013-10-12 DIAGNOSIS — F319 Bipolar disorder, unspecified: Secondary | ICD-10-CM | POA: Insufficient documentation

## 2013-10-12 DIAGNOSIS — E119 Type 2 diabetes mellitus without complications: Secondary | ICD-10-CM | POA: Insufficient documentation

## 2013-10-12 DIAGNOSIS — Z794 Long term (current) use of insulin: Secondary | ICD-10-CM | POA: Insufficient documentation

## 2013-10-12 DIAGNOSIS — F411 Generalized anxiety disorder: Secondary | ICD-10-CM | POA: Insufficient documentation

## 2013-10-12 DIAGNOSIS — IMO0002 Reserved for concepts with insufficient information to code with codable children: Secondary | ICD-10-CM | POA: Insufficient documentation

## 2013-10-12 DIAGNOSIS — Z8619 Personal history of other infectious and parasitic diseases: Secondary | ICD-10-CM | POA: Insufficient documentation

## 2013-10-12 DIAGNOSIS — Z8719 Personal history of other diseases of the digestive system: Secondary | ICD-10-CM | POA: Insufficient documentation

## 2013-10-12 DIAGNOSIS — F17200 Nicotine dependence, unspecified, uncomplicated: Secondary | ICD-10-CM | POA: Insufficient documentation

## 2013-10-12 MED ORDER — OXYCODONE HCL 5 MG PO CAPS: 5.0000 mg | ORAL_CAPSULE | ORAL | Status: DC | PRN

## 2013-10-12 MED ORDER — OXYCODONE HCL 5 MG PO TABS
5.0000 mg | ORAL_TABLET | Freq: Once | ORAL | Status: AC
  Administered 2013-10-12: 5 mg via ORAL
  Filled 2013-10-12: qty 1

## 2013-10-12 NOTE — ED Notes (Signed)
Pt reports left knee/leg pain for several weeks. Pain has gotten worse today. Hx gout.

## 2013-10-12 NOTE — ED Provider Notes (Signed)
Medical screening examination/treatment/procedure(s) were performed by non-physician practitioner and as supervising physician I was immediately available for consultation/collaboration.  Gerhard Munchobert Marquesa Rath, MD 10/12/13 [id number]

## 2013-10-12 NOTE — Progress Notes (Signed)
Orthopedic Tech Progress Note Patient Details:  [name] 04-Aug-1966 [id number] KI applied. Crutches fit to height and comfort. Ortho Devices Type of Ortho Device: Crutches;Knee Immobilizer Ortho Device/Splint Location: Left LE Ortho Device/Splint Interventions: Application   Asia R Thompson 10/12/2013, 11:27 AM

## 2013-10-12 NOTE — ED Provider Notes (Signed)
CSN: [id number]     Arrival date & time 10/12/13  [id number] History   First MD Initiated Contact with Patient 10/12/13 [phone]     Chief Complaint  Patient presents with  . Leg Pain     (Consider location/radiation/quality/duration/timing/severity/associated sxs/prior Treatment) HPI Comments: Pt state that she has been having left knee pain for the last couple of weeks. States that someone told her it may be gout but it doesn't feel similar. Denies fever,falls, redness or swelling. No previous injury to the knee  The history is provided by the patient. No language interpreter was used.    Past Medical History  Diagnosis Date  . Asthma   . Diabetes mellitus   . Gout   . High cholesterol   . Hypertension   . Hepatitis B infection   . Anxiety   . Depression   . Bipolar 1 disorder   . Liver cirrhosis    Past Surgical History  Procedure Laterality Date  . Cesarean section    . Tubal ligation    . Laparoscopy    . Biopsy of liver     Family History  Problem Relation Age of Onset  . Other Neg Hx    History  Substance Use Topics  . Smoking status: Current Every Day Smoker -- 0.25 packs/day  . Smokeless tobacco: Not on file  . Alcohol Use: No     Comment: ocassional   OB History   Grav Para Term Preterm Abortions TAB SAB Ect Mult Living   6 4 0 4 2 0 2  0 4     Review of Systems  Constitutional: Negative.   Respiratory: Negative.   Cardiovascular: Negative.       Allergies  Tylenol  Home Medications   Current Outpatient Rx  Name  Route  Sig  Dispense  Refill  . albuterol (PROVENTIL HFA;VENTOLIN HFA) 108 (90 BASE) MCG/ACT inhaler   Inhalation   Inhale 2 puffs into the lungs every 6 (six) hours as needed for wheezing or shortness of breath.          . Dexlansoprazole (DEXILANT PO)   Oral   Take 1 tablet by mouth 2 (two) times daily. Samples given by Dr. Debarah CrapeMaygard         . diphenhydrAMINE (BENADRYL) 25 MG tablet   Oral   Take 25 mg by mouth every 8 (eight)  hours as needed for itching.          Marland Kitchen. ibuprofen (ADVIL,MOTRIN) 800 MG tablet   Oral   Take 800 mg by mouth every 6 (six) hours as needed (pain).          . insulin lispro (HUMALOG) 100 UNIT/ML injection   Subcutaneous   Inject 2-5 Units into the skin 3 (three) times daily before meals. Sliding scale uses with humilin N .         . insulin regular (NOVOLIN R,HUMULIN R) 100 units/mL injection   Subcutaneous   Inject 4-11 Units into the skin 3 (three) times daily before meals. Sliding scale uses with humalog as well           . mercaptopurine (PURINETHOL) 50 MG tablet   Oral   Take 75 mg by mouth daily. Give on an empty stomach 1 hour before or 2 hours after meals. Caution: Chemotherapy.         . metFORMIN (GLUCOPHAGE) 1000 MG tablet   Oral   Take 2,000 mg by mouth 2 (two) times daily with a  meal.          . Omega-3 Fatty Acids (FISH OIL PO)   Oral   Take 1 tablet by mouth daily.         . predniSONE (DELTASONE) 5 MG tablet   Oral   Take 20 mg by mouth every morning.          . traZODone (DESYREL) 150 MG tablet   Oral   Take 150 mg by mouth at bedtime.          Marland Kitchen venlafaxine (EFFEXOR-XR) 150 MG 24 hr capsule   Oral   Take 75-150 mg by mouth every evening.           BP 127/82  Pulse 89  Temp(Src) 97.9 F (36.6 C) (Oral)  Resp 18  SpO2 98%  LMP 10/05/2013 Physical Exam  Nursing note and vitals reviewed. Constitutional: She is oriented to person, place, and time. She appears well-nourished.  Cardiovascular: Normal rate and regular rhythm.   Pulmonary/Chest: Effort normal and breath sounds normal.  Musculoskeletal: Normal range of motion.  Swelling noted to the left knee. Pt has full rom. No gross deformity, redness or warmth to the area  Neurological: She is alert and oriented to person, place, and time.  Skin: Skin is warm and dry.  Psychiatric: She has a normal mood and affect.    ED Course  Procedures (including critical care time) Labs  Review Labs Reviewed - No data to display Imaging Review Dg Knee Complete 4 Views Left  10/12/2013   CLINICAL DATA:  Worsening left knee pain for 3 weeks, history of gout, no known injury  EXAM: LEFT KNEE - COMPLETE 4+ VIEW  COMPARISON:  None.  FINDINGS: Examination is degraded due to patient body habitus.  No fracture or dislocation. Joint spaces are preserved. Note is made of a moderate-sized joint effusion. No evidence of chondrocalcinosis. There is minimal spurring of the tibial spines. Regional soft tissues appear normal.  IMPRESSION: Moderate-sized joint effusion.  Otherwise, no acute findings.   Electronically Signed   By: Simonne Come M.D.   On: 10/12/2013 09:50     EKG Interpretation None      MDM   Final diagnoses:  Knee effusion, left    No redness, warmth or fever. With treat symptomatically with immobilizer and oxyir and have follow up with ortho    Teressa Lower, NP 10/12/13 1055

## 2013-10-12 NOTE — ED Notes (Signed)
Ortho coming down to apply knee immobilizer and crutches.

## 2013-10-12 NOTE — Discharge Instructions (Signed)
Knee Effusion  Knee effusion means you have fluid in your knee. The knee may be more difficult to bend and move. HOME CARE  Use crutches or a brace as told by your doctor.  Put ice on the injured area.  Put ice in a plastic bag.  Place a towel between your skin and the bag.  Leave the ice on for 15-20 minutes, 03-04 times a day.  Raise (elevate) your knee as much as possible.  Only take medicine as told by your doctor.  You may need to do strengthening exercises. Ask your doctor.  Continue with your normal diet and activities as told by your doctor. GET HELP RIGHT AWAY IF:  You have more puffiness (swelling) in your knee.  You see redness, puffiness, or have more pain in your knee.  You have a temperature by mouth above 102 F (38.9 C).  You get a rash.  You have trouble breathing.  You have a reaction to any medicine you are taking.  You have a lot of pain when you move your knee. MAKE SURE YOU:  Understand these instructions.  Will watch your condition.  Will get help right away if you are not doing well or get worse. Document Released: 09/02/2010 Document Revised: 10/23/2011 Document Reviewed: 09/02/2010 ExitCare Patient Information 2014 [employer].  

## 2014-01-26 ENCOUNTER — Encounter (HOSPITAL_COMMUNITY): Payer: Self-pay | Admitting: Emergency Medicine

## 2014-01-26 ENCOUNTER — Emergency Department (HOSPITAL_COMMUNITY)
Admission: EM | Admit: 2014-01-26 | Discharge: 2014-01-26 | Disposition: A | Discharge status: Home / Self Care | Payer: Medicare Other | Attending: Emergency Medicine | Admitting: Emergency Medicine

## 2014-01-26 DIAGNOSIS — Z794 Long term (current) use of insulin: Secondary | ICD-10-CM | POA: Insufficient documentation

## 2014-01-26 DIAGNOSIS — Z79899 Other long term (current) drug therapy: Secondary | ICD-10-CM | POA: Insufficient documentation

## 2014-01-26 DIAGNOSIS — IMO0002 Reserved for concepts with insufficient information to code with codable children: Secondary | ICD-10-CM | POA: Insufficient documentation

## 2014-01-26 DIAGNOSIS — F17200 Nicotine dependence, unspecified, uncomplicated: Secondary | ICD-10-CM | POA: Insufficient documentation

## 2014-01-26 DIAGNOSIS — F319 Bipolar disorder, unspecified: Secondary | ICD-10-CM | POA: Insufficient documentation

## 2014-01-26 DIAGNOSIS — B9689 Other specified bacterial agents as the cause of diseases classified elsewhere: Secondary | ICD-10-CM | POA: Insufficient documentation

## 2014-01-26 DIAGNOSIS — A499 Bacterial infection, unspecified: Secondary | ICD-10-CM | POA: Insufficient documentation

## 2014-01-26 DIAGNOSIS — Z8619 Personal history of other infectious and parasitic diseases: Secondary | ICD-10-CM | POA: Insufficient documentation

## 2014-01-26 DIAGNOSIS — R300 Dysuria: Secondary | ICD-10-CM

## 2014-01-26 DIAGNOSIS — J45909 Unspecified asthma, uncomplicated: Secondary | ICD-10-CM | POA: Insufficient documentation

## 2014-01-26 DIAGNOSIS — F411 Generalized anxiety disorder: Secondary | ICD-10-CM | POA: Insufficient documentation

## 2014-01-26 DIAGNOSIS — Z8719 Personal history of other diseases of the digestive system: Secondary | ICD-10-CM | POA: Insufficient documentation

## 2014-01-26 DIAGNOSIS — Z3202 Encounter for pregnancy test, result negative: Secondary | ICD-10-CM | POA: Insufficient documentation

## 2014-01-26 DIAGNOSIS — E119 Type 2 diabetes mellitus without complications: Secondary | ICD-10-CM | POA: Insufficient documentation

## 2014-01-26 DIAGNOSIS — M109 Gout, unspecified: Secondary | ICD-10-CM | POA: Insufficient documentation

## 2014-01-26 DIAGNOSIS — M25473 Effusion, unspecified ankle: Secondary | ICD-10-CM | POA: Insufficient documentation

## 2014-01-26 DIAGNOSIS — I10 Essential (primary) hypertension: Secondary | ICD-10-CM | POA: Insufficient documentation

## 2014-01-26 DIAGNOSIS — N760 Acute vaginitis: Secondary | ICD-10-CM | POA: Insufficient documentation

## 2014-01-26 DIAGNOSIS — J329 Chronic sinusitis, unspecified: Secondary | ICD-10-CM | POA: Insufficient documentation

## 2014-01-26 DIAGNOSIS — M25476 Effusion, unspecified foot: Secondary | ICD-10-CM | POA: Insufficient documentation

## 2014-01-26 LAB — CBC WITH DIFFERENTIAL/PLATELET
BASOS PCT: 0 % (ref 0–1)
Basophils Absolute: 0 10*3/uL (ref 0.0–0.1)
EOS ABS: 0.2 10*3/uL (ref 0.0–0.7)
Eosinophils Relative: 2 % (ref 0–5)
HEMATOCRIT: 33.9 % — AB (ref 36.0–46.0)
HEMOGLOBIN: 11.2 g/dL — AB (ref 12.0–15.0)
Lymphocytes Relative: 21 % (ref 12–46)
Lymphs Abs: 2.5 10*3/uL (ref 0.7–4.0)
MCH: 31.4 pg (ref 26.0–34.0)
MCHC: 33 g/dL (ref 30.0–36.0)
MCV: 95 fL (ref 78.0–100.0)
MONO ABS: 0.6 10*3/uL (ref 0.1–1.0)
MONOS PCT: 5 % (ref 3–12)
NEUTROS ABS: 8.8 10*3/uL — AB (ref 1.7–7.7)
Neutrophils Relative %: 72 % (ref 43–77)
Platelets: 315 10*3/uL (ref 150–400)
RBC: 3.57 MIL/uL — ABNORMAL LOW (ref 3.87–5.11)
RDW: 17.3 % — ABNORMAL HIGH (ref 11.5–15.5)
WBC: 12.2 10*3/uL — ABNORMAL HIGH (ref 4.0–10.5)

## 2014-01-26 LAB — URINALYSIS, ROUTINE W REFLEX MICROSCOPIC
Bilirubin Urine: NEGATIVE
GLUCOSE, UA: NEGATIVE mg/dL
HGB URINE DIPSTICK: NEGATIVE
KETONES UR: NEGATIVE mg/dL
Leukocytes, UA: NEGATIVE
Nitrite: NEGATIVE
PROTEIN: NEGATIVE mg/dL
Specific Gravity, Urine: 1.013 (ref 1.005–1.030)
Urobilinogen, UA: 0.2 mg/dL (ref 0.0–1.0)
pH: 6 (ref 5.0–8.0)

## 2014-01-26 LAB — COMPREHENSIVE METABOLIC PANEL
ALBUMIN: 3.6 g/dL (ref 3.5–5.2)
ALT: 16 U/L (ref 0–35)
AST: 18 U/L (ref 0–37)
Alkaline Phosphatase: 27 U/L — ABNORMAL LOW (ref 39–117)
BILIRUBIN TOTAL: 0.3 mg/dL (ref 0.3–1.2)
BUN: 10 mg/dL (ref 6–23)
CHLORIDE: 99 meq/L (ref 96–112)
CO2: 25 mEq/L (ref 19–32)
CREATININE: 0.53 mg/dL (ref 0.50–1.10)
Calcium: 9.4 mg/dL (ref 8.4–10.5)
GFR calc non Af Amer: >90 mL/min (ref >90)
Glucose, Bld: 96 mg/dL (ref 70–99)
Potassium: 3.8 mEq/L (ref 3.7–5.3)
Sodium: 138 mEq/L (ref 137–147)
Total Protein: 7.5 g/dL (ref 6.0–8.3)

## 2014-01-26 LAB — WET PREP, GENITAL
TRICH WET PREP: NONE SEEN
WBC, Wet Prep HPF POC: NONE SEEN
YEAST WET PREP: NONE SEEN

## 2014-01-26 LAB — CBG MONITORING, ED: Glucose-Capillary: 170 mg/dL — ABNORMAL HIGH (ref 70–99)

## 2014-01-26 LAB — PREGNANCY, URINE: Preg Test, Ur: NEGATIVE

## 2014-01-26 MED ORDER — SALINE SPRAY 0.65 % NA SOLN: 2.0000 | NASAL | Status: DC | PRN

## 2014-01-26 MED ORDER — METRONIDAZOLE 500 MG PO TABS: 500.0000 mg | ORAL_TABLET | Freq: Two times a day (BID) | ORAL | Status: DC

## 2014-01-26 NOTE — ED Provider Notes (Signed)
Medical screening examination/treatment/procedure(s) were performed by non-physician practitioner and as supervising physician I was immediately available for consultation/collaboration.    Nelia Shiobert L Lorey Pallett, MD 01/26/14 [id number]

## 2014-01-26 NOTE — Discharge Instructions (Signed)
Bacterial Vaginosis °Bacterial vaginosis is a vaginal infection that occurs when the normal balance of bacteria in the vagina is disrupted. It results from an overgrowth of certain bacteria. This is the most common vaginal infection in women of childbearing age. Treatment is important to prevent complications, especially in pregnant women, as it can cause a premature delivery. °CAUSES  °Bacterial vaginosis is caused by an increase in harmful bacteria that are normally present in smaller amounts in the vagina. Several different kinds of bacteria can cause bacterial vaginosis. However, the reason that the condition develops is not fully understood. °RISK FACTORS °Certain activities or behaviors can put you at an increased risk of developing bacterial vaginosis, including: °· Having a new sex partner or multiple sex partners. °· Douching. °· Using an intrauterine device (IUD) for contraception. °Women do not get bacterial vaginosis from toilet seats, bedding, swimming pools, or contact with objects around them. °SIGNS AND SYMPTOMS  °Some women with bacterial vaginosis have no signs or symptoms. Common symptoms include: °· Grey vaginal discharge. °· A fishlike odor with discharge, especially after sexual intercourse. °· Itching or burning of the vagina and vulva. °· Burning or pain with urination. °DIAGNOSIS  °Your health care provider will take a medical history and examine the vagina for signs of bacterial vaginosis. A sample of vaginal fluid may be taken. Your health care provider will look at this sample under a microscope to check for bacteria and abnormal cells. A vaginal pH test may also be done.  °TREATMENT  °Bacterial vaginosis may be treated with antibiotic medicines. These may be given in the form of a pill or a vaginal cream. A second round of antibiotics may be prescribed if the condition comes back after treatment.  °HOME CARE INSTRUCTIONS  °· Only take over-the-counter or prescription medicines as  directed by your health care provider. °· If antibiotic medicine was prescribed, take it as directed. Make sure you finish it even if you start to feel better. °· Do not have sex until treatment is completed. °· Tell all sexual partners that you have a vaginal infection. They should see their health care provider and be treated if they have problems, such as a mild rash or itching. °· Practice safe sex by using condoms and only having one sex partner. °SEEK MEDICAL CARE IF:  °· Your symptoms are not improving after 3 days of treatment. °· You have increased discharge or pain. °· You have a fever. °MAKE SURE YOU:  °· Understand these instructions. °· Will watch your condition. °· Will get help right away if you are not doing well or get worse. °FOR MORE INFORMATION  °Centers for Disease Control and Prevention, Division of STD Prevention: www.cdc.gov/std °American Sexual Health Association (ASHA): www.ashastd.org  °Document Released: 07/31/2005 Document Revised: 05/21/2013 Document Reviewed: 03/12/2013 °ExitCare® Patient Information ©2014 [employer]. ° ° °Emergency Department Resource Guide °1) Find a Doctor and Pay Out of Pocket °Although you won't have to find out who is covered by your insurance plan, it is a good idea to ask around and get recommendations. You will then need to call the office and see if the doctor you have chosen will accept you as a new patient and what types of options they offer for patients who are self-pay. Some doctors offer discounts or will set up payment plans for their patients who do not have insurance, but you will need to ask so you aren't surprised when you get to your appointment. ° °2) Contact Your Local   Health Department °Not all health departments have doctors that can see patients for sick visits, but many do, so it is worth a call to see if yours does. If you don't know where your local health department is, you can check in your phone book. The CDC also has a tool to help  you locate your state's health department, and many state websites also have listings of all of their local health departments. ° °3) Find a Walk-in Clinic °If your illness is not likely to be very severe or complicated, you may want to try a walk in clinic. These are popping up all over the country in pharmacies, drugstores, and shopping centers. They're usually staffed by nurse practitioners or physician assistants that have been trained to treat common illnesses and complaints. They're usually fairly quick and inexpensive. However, if you have serious medical issues or chronic medical problems, these are probably not your best option. ° °No Primary Care Doctor: °- Call [facility] at  832-8000 - they can help you locate a primary care doctor that  accepts your insurance, provides certain services, etc. °- Physician Referral Service- 1-800-533-3463 ° °Chronic Pain Problems: °Organization         Address  Phone   Notes  °[facility]  (336) 297-2271 Patients need to be referred by their primary care doctor.  ° °Medication Assistance: °Organization         Address  Phone   Notes  °[place] [facility] [address] °[place], [place] [address] (336) 641-8030 --Must be a resident of [place] °-- Must have NO insurance coverage whatsoever (no Medicaid/ Medicare, etc.) °-- The pt. MUST have a primary care doctor that directs their care regularly and follows them in the community °  °[facility]  (866) 331-1348   °[employer]  (888) 892-1162   ° °Agencies that provide inexpensive medical care: °Organization         Address  Phone   Notes  °[facility]  (336) 832-8035   °[facility]    (336) 832-7272   °[facility] [address] °[place], [place] [address] (336) 832-4777   °[facility] [address], °[place] (336) 271-4999   °[facility]    (336) 373-0678   °[facility]    (336) 272-1050   °[facility] ° [address], [place] Phone:  (336) 832-4444, Fax:  (336) 832-4440 Hours of Operation:  9 am - 6 pm, M-F.  Also accepts Medicaid/Medicare and self-pay.  °[facility] ° [address], [place] Phone: (336) 832-3150, Fax: (336) 832-3151. Hours of Operation:  8:30 am - 5:30 pm, M-F.  Also accepts Medicaid and self-pay.  °[facility] [place] [address], [place] Phone: (336) 878-6027   °[facility] [address], [place], [place] (336)723-1848, Ext. 123 Mondays & Thursdays: 7-9 AM.  First 15 patients are seen on a first come, first serve basis. °  ° °Medicaid-accepting [place] Providers: ° °Organization         Address  Phone   Notes  °[facility] [address], [place] (336) 641-2100 Also accepts self-pay patients.  °[facility] [address], [place] ° (336) 856-9996   °[facility] [address], [place] (336) 288-8857   °  [facility] [address] [place] [address], [place] (336) 299-7000   °Veita Bland [address], [place]  ° (336) 373-1557 Only accepts [place] Access Medicaid patients after they have their name applied to their card.  ° °Self-Pay (no insurance) in [place]: ° °Organization         Address  Phone   Notes  °Sickle Cell Patients, [facility] [address], [place] (336) 832-1970   °[facility] [address], [place] (336) 832-4400   °[facility] ° [address], [place] (336) 992-4800   °[facility]/Dr. Osei-Bonsu ° [address] [place] [address], [place] or [address], [place] (336) 841-8500 Phone number for both [place] and [place] locations is the same.  °[facility] [address],  [place] (336) 299-0000   °[facility] [address] [place] [address], [place] or [address] (336) 852-7530 °(336) 878-2260   °[facility] [address], [place] (336) 350-1642, phone; (336) 294-5005, fax Sees patients 1st and 3rd Saturday of every month.  Must not qualify for public or private insurance (i.e. Medicaid, Medicare, [place] Health Choice, Veterans' Benefits) • Household income should be no more than 200% of the poverty level •The clinic cannot treat you if you are pregnant or think you are pregnant • Sexually transmitted diseases are not treated at the clinic.  ° ° °Dental Care: °Organization         Address  Phone  Notes  °[facility] [address], [place] (336) 641-6152 Accepts children up to age 21 who are enrolled in Medicaid or [place] Health Choice; pregnant women with a Medicaid card; and children who have applied for Medicaid or [place] Health Choice, but were declined, whose parents can pay a reduced fee at time of service.  °[facility] [place]  [address], [place] (336) 641-7733 Accepts children up to age 21 who are enrolled in Medicaid or [place] Health Choice; pregnant women with a Medicaid card; and children who have applied for Medicaid or  Health Choice, but were declined, whose parents can pay a reduced fee at time of service.  °[facility] ° [address], [place] (336) 641-4533 Patients are seen by appointment only. Walk-ins are not accepted. [facility] will see patients 18 years of age and older. °Monday - Tuesday (8am-5pm) °Most Wednesdays (8:30-5pm) °$30 per visit, cash only  °[facility] ° [address], [place] (336) 641-4533 Patients are seen by appointment only. Walk-ins are not accepted. [facility] will see patients 18 years of age and older. °One Wednesday Evening  (Monthly: Volunteer Based).  $30 per visit, cash only  °[facility]  (919) 537-3737 for adults; Children under age 4, call Graduate Pediatric Dentistry at (919) [phone]. Children aged 4-14, please call (919) [phone] to request a pediatric application. ° Dental services are provided in all areas of dental care including fillings, crowns and bridges, complete and partial dentures, implants, gum treatment, root canals, and extractions. Preventive care is also provided. Treatment is provided to both adults and children. °Patients are selected via a lottery and there is often a waiting list. °  °[facility] [address], °[place] ° (336) 763-8833 www.drcivils.com °  °  [facility] [address], [place], [place] (336)723-1848, Ext. 123 Second and Fourth Thursday of each month, opens at 6:30 AM; Clinic ends at 9 AM.  Patients are seen on a first-come first-served basis, and a limited number are seen during each clinic.  ° °[facility] ° [address], [place], [place] (336) 723-7904   Eligibility Requirements °You must have lived in [place], [place], or [place] counties for at least the last three months. °  You cannot be eligible for state or federal sponsored healthcare insurance, including Veterans Administration, Medicaid, or Medicare. °  You generally cannot be eligible for healthcare insurance through your employer.  °  How to apply: °Eligibility screenings are held every Tuesday and Wednesday afternoon from 1:00 pm until 4:00 pm. You do not need an appointment for the interview!  °[facility] [address], [place], [place] 336-631-2330   °[facility]  336-342-8273   °[facility]  336-703-3100   °[facility]  336-570-6415   ° °Behavioral Health Resources in the Community: °Intensive Outpatient Programs °Organization         Address  Phone  Notes  °[place]  [facility] [address], [place], [place] 336-878-6098   °[facility] [address], [place], [place] 336-832-9800   °[facility] [address], [place], [place] ° 336-882-2125   °[facility] [address],  °[place], [place] 1-800-853-5163 or 336-641-4981   °Substance Abuse Resources °Organization         Address  Phone  Notes  °[facility]  336-882-2125   °[facility]  336-784-9470   °[facility]  336-285-9073   °[facility]  336-845-3988   °Residential & Outpatient Substance Abuse Program  1-800-659-3381   °Psychological Services °Organization         Address  Phone  Notes  °[facility]  336- 832-9600   °[facility]  336- 378-7881   °[facility] [address], [place] 1-800-853-5163 or 336-641-4981   ° °Mobile Crisis Teams °Organization         Address  Phone  Notes  °[facility], Mobile Crisis Care Unit  1-877-626-1772   °Assertive °[facility] ° [address] [place], [place] 336-834-9664   °[name] DeEsch [address] °[place] 336-554-5454   ° °Self-Help/Support Groups °Organization         Address  Phone             Notes  °[facility] [place] [facility] - variety of support groups  336- 373-1402 Call for more information  °Narcotics Anonymous (NA), [facility] [address], °[place]  2 meetings at this location  ° °Residential Treatment Programs °Organization         Address  Phone  Notes  °[facility] [address],    °[place]  1-866-801-8205   °[facility] ° [address], [place], [place] 704-293-8524   °[facility] [address], [place] 336-845-3988 Admissions: 8am-3pm M-F  °[facility] [address]    °[place], [place] 336-841-1104   °[facility] [address],  [place],  336-379-7146   °[facility] [address]  °[place],  336-285-9073   °[facility] - Intensive Outpatient [address], [place], [place] 336-852-3033   °[facility] [address]  °[place], [place] 1-877-615-2722 or 336-784-9470   °[facility] [address] [place], [place] 336-227-7417 Accepts Medicaid  °[facility] [address]  °[place] 1-800-659-3381 Substance Abuse/Addiction Treatment  ° °[place] Behavioral Health Resources °Organization         Address  Phone  Notes  °[facility]  (888) 581-9988   °Julie Brannon, PhD [address] [place], [place]   (336) 349-5553 or (336) 951-0000   °[facility]   [address] °[place], [place] (336) 349-4454   °[facility] [address], [place], [place] (336) 342-8316 Insurance/Medicaid/sponsorship through [facility]  °[facility] [address]                                    [place], [place] (336) 342-8316 Therapy/tele-psych/case  °[facility] [address]  ° [place], [place] (336) 349-2233    °Dr. Arfeen  (336) 349-4544   °[facility] [place]  [employer] [place] [facility] 1) [address], [place] °2) [address], [place] °3)  [address], [place] (336) 349-3220 °(336) 342-7768 ° °(336) 342-8140   °[place] [facility] (336) 342-1394 or (336) 342-3537 (After Hours)    ° ° ° ° °

## 2014-01-26 NOTE — ED Notes (Signed)
sts toes are numb as well with hx of gout.

## 2014-01-26 NOTE — ED Notes (Addendum)
Pt here for head fullness around nose/sinuses. Bilateral ankle swelling and pain. Great toes are also numb but this is normal for pt.

## 2014-01-26 NOTE — ED Provider Notes (Signed)
Medical screening examination/treatment/procedure(s) were performed by non-physician practitioner and as supervising physician I was immediately available for consultation/collaboration.    Nelia Shiobert L Charistopher Rumble, MD 01/26/14 2009

## 2014-01-26 NOTE — ED Provider Notes (Signed)
Patient's wet prep shows that she has got many clue, cells.  She'll be treated with Flagyl for bacterial, vaginosis.  She's also been given a prescription for her.  She had a nasal spray for her sinus congestion.  Recommend patient followup with her primary care physician  Arman FilterGail K Jayke Caul, NP 01/26/14 2121

## 2014-01-26 NOTE — ED Provider Notes (Signed)
CSN: [id number]     Arrival date & time 01/26/14  1421 History   First MD Initiated Contact with Patient 01/26/14 1803     Chief Complaint  Patient presents with  . Facial Pain  . Leg Swelling     (Consider location/radiation/quality/duration/timing/severity/associated sxs/prior Treatment) HPI  Patient presents with multiple complaints.   1.  States she has had abnormal vaginal discharge off and on for 1 month with itching and dysuria.  Denies fevers, abdominal pain.  LMP approximately one month ago.    2. Pt also notes bilateral ear fullness and bilateral maxillary sinus pain.  No fevers, rhinorrhea, sore throat, cough.  3. Pt also has hx cirrhosis and feels that her cirrhosis is "acting up."  Feels that "something be moving" in her RUQ, feels "bruised."  Denies N/V or any other concerning symptoms.  4. Pt also c/o bilateral ankle swelling and foot pain that has been ongoing for years.  This is unchanged. No recent injury or fall.      Past Medical History  Diagnosis Date  . Asthma   . Diabetes mellitus   . Gout   . High cholesterol   . Hypertension   . Hepatitis B infection   . Anxiety   . Depression   . Bipolar 1 disorder   . Liver cirrhosis    Past Surgical History  Procedure Laterality Date  . Cesarean section    . Tubal ligation    . Laparoscopy    . Biopsy of liver     Family History  Problem Relation Age of Onset  . Other Neg Hx    History  Substance Use Topics  . Smoking status: Current Every Day Smoker -- 0.25 packs/day  . Smokeless tobacco: Not on file  . Alcohol Use: No     Comment: ocassional   OB History   Grav Para Term Preterm Abortions TAB SAB Ect Mult Living   6 4 0 4 2 0 2  0 4     Review of Systems  All other systems reviewed and are negative.     Allergies  Tylenol  Home Medications   Prior to Admission medications   Medication Sig Start Date End Date Taking? Authorizing Provider  albuterol (PROVENTIL HFA;VENTOLIN HFA) 108  (90 BASE) MCG/ACT inhaler Inhale 2 puffs into the lungs every 6 (six) hours as needed for wheezing or shortness of breath.    Yes Historical Provider, MD  Dexlansoprazole (DEXILANT PO) Take 1 tablet by mouth 2 (two) times daily. Samples given by Dr. Debarah CrapeMaygard   Yes Historical Provider, MD  diphenhydrAMINE (BENADRYL) 25 MG tablet Take 25 mg by mouth every 8 (eight) hours as needed for itching.    Yes Historical Provider, MD  ibuprofen (ADVIL,MOTRIN) 800 MG tablet Take 800 mg by mouth every 6 (six) hours as needed (pain).    Yes Historical Provider, MD  insulin lispro (HUMALOG) 100 UNIT/ML injection Inject 2-5 Units into the skin 3 (three) times daily before meals. Sliding scale uses with humilin N .   Yes Historical Provider, MD  insulin regular (NOVOLIN R,HUMULIN R) 100 units/mL injection Inject 4-11 Units into the skin 3 (three) times daily before meals. Sliding scale uses with humalog as well     Yes Historical Provider, MD  mercaptopurine (PURINETHOL) 50 MG tablet Take 75 mg by mouth daily. Give on an empty stomach 1 hour before or 2 hours after meals. Caution: Chemotherapy.   Yes Historical Provider, MD  metFORMIN (GLUCOPHAGE) 1000  MG tablet Take 2,000 mg by mouth 2 (two) times daily with a meal.    Yes Historical Provider, MD  Omega-3 Fatty Acids (FISH OIL PO) Take 1 tablet by mouth daily.   Yes Historical Provider, MD  predniSONE (DELTASONE) 5 MG tablet Take 20 mg by mouth every morning.    Yes Historical Provider, MD  traZODone (DESYREL) 150 MG tablet Take 150 mg by mouth at bedtime.    Yes Historical Provider, MD  venlafaxine (EFFEXOR-XR) 150 MG 24 hr capsule Take 75-150 mg by mouth every evening.    Yes Historical Provider, MD   BP 138/91  Pulse 75  Temp(Src) 98.1 F (36.7 C) (Oral)  Resp 24  Ht 5\' 1"  (1.549 m)  Wt 225 lb (102.059 kg)  BMI 42.54 kg/m2  SpO2 100% Physical Exam  Nursing note and vitals reviewed. Constitutional: She appears well-developed and well-nourished. No distress.   HENT:  Head: Normocephalic and atraumatic.  Right Ear: Tympanic membrane and ear canal normal.  Left Ear: Tympanic membrane and ear canal normal.  Nose: Right sinus exhibits maxillary sinus tenderness. Right sinus exhibits no frontal sinus tenderness. Left sinus exhibits maxillary sinus tenderness. Left sinus exhibits no frontal sinus tenderness.  Mouth/Throat: Uvula is midline and oropharynx is clear and moist. Mucous membranes are not dry. No oropharyngeal exudate, posterior oropharyngeal edema, posterior oropharyngeal erythema or tonsillar abscesses.  Eyes: Conjunctivae are normal.  Neck: Normal range of motion. Neck supple.  Cardiovascular: Normal rate and regular rhythm.   Pulmonary/Chest: Effort normal and breath sounds normal. No respiratory distress. She has no wheezes. She has no rales.  Abdominal: Soft. She exhibits no distension. There is tenderness in the suprapubic area. There is no rebound and no guarding.  Genitourinary: Uterus is not tender. Cervix exhibits no motion tenderness. Right adnexum displays no mass, no tenderness and no fullness. Left adnexum displays no mass, no tenderness and no fullness. No erythema, tenderness or bleeding around the vagina. No foreign body around the vagina. No signs of injury around the vagina. Vaginal discharge found.  Thick white/yellow discharge.  Exam somewhat limited secondary to body habitus.   Pelvic performed by Suzzette Righterourtney Scott, PA-S, under my direct supervision.   Neurological: She is alert.  Skin: She is not diaphoretic.    ED Course  Procedures (including critical care time) Labs Review Labs Reviewed  CBG MONITORING, ED - Abnormal; Notable for the following:    Glucose-Capillary 170 (*)    All other components within normal limits  GC/CHLAMYDIA PROBE AMP  WET PREP, GENITAL  RPR  HIV ANTIBODY (ROUTINE TESTING)  URINALYSIS, ROUTINE W REFLEX MICROSCOPIC  PREGNANCY, URINE  CBC WITH DIFFERENTIAL  COMPREHENSIVE METABOLIC PANEL     Imaging Review No results found.   EKG Interpretation None      MDM   Final diagnoses:  Vaginal discharge  Dysuria  Sinusitis    Pt presenting with multiple complaints.  Labs, wet prep, STD panel pending.  Discussed pt with Earley FavorGail Schulz, NP, who assumes care at change of shift.  Anticipate d/c home +/- antibiotics and with symptomatic treatment.       Trixie Dredgemily Jaymz Traywick, PA-C 01/26/14 2006

## 2014-01-26 NOTE — ED Notes (Signed)
Pt reports head fullness, ear ache, headache, and feet swelling hx of DM and Cirrhosis, pt sts she irritated in her vagina as well.

## 2014-01-27 ENCOUNTER — Telehealth (HOSPITAL_BASED_OUTPATIENT_CLINIC_OR_DEPARTMENT_OTHER): Payer: Self-pay | Admitting: *Deleted

## 2014-01-27 LAB — HIV ANTIBODY (ROUTINE TESTING W REFLEX): HIV 1&2 Ab, 4th Generation: NONREACTIVE

## 2014-01-27 LAB — GC/CHLAMYDIA PROBE AMP
CT PROBE, AMP APTIMA: NEGATIVE
GC Probe RNA: NEGATIVE

## 2014-01-27 LAB — RPR

## 2014-01-28 ENCOUNTER — Telehealth (HOSPITAL_BASED_OUTPATIENT_CLINIC_OR_DEPARTMENT_OTHER): Payer: Self-pay

## 2014-01-28 NOTE — Telephone Encounter (Signed)
Pt calling for lab results Gonorrhea and chlamydia both negative HIV and RPR are non reactive

## 2014-03-06 ENCOUNTER — Emergency Department (HOSPITAL_COMMUNITY)
Admission: EM | Admit: 2014-03-06 | Discharge: 2014-03-06 | Disposition: A | Discharge status: Home / Self Care | Payer: Medicare Other | Attending: Emergency Medicine | Admitting: Emergency Medicine

## 2014-03-06 ENCOUNTER — Encounter (HOSPITAL_COMMUNITY): Payer: Self-pay | Admitting: Emergency Medicine

## 2014-03-06 DIAGNOSIS — Z79899 Other long term (current) drug therapy: Secondary | ICD-10-CM | POA: Diagnosis not present

## 2014-03-06 DIAGNOSIS — Z8619 Personal history of other infectious and parasitic diseases: Secondary | ICD-10-CM | POA: Insufficient documentation

## 2014-03-06 DIAGNOSIS — E119 Type 2 diabetes mellitus without complications: Secondary | ICD-10-CM | POA: Diagnosis not present

## 2014-03-06 DIAGNOSIS — R110 Nausea: Secondary | ICD-10-CM | POA: Insufficient documentation

## 2014-03-06 DIAGNOSIS — F17200 Nicotine dependence, unspecified, uncomplicated: Secondary | ICD-10-CM | POA: Insufficient documentation

## 2014-03-06 DIAGNOSIS — Z794 Long term (current) use of insulin: Secondary | ICD-10-CM | POA: Insufficient documentation

## 2014-03-06 DIAGNOSIS — J45909 Unspecified asthma, uncomplicated: Secondary | ICD-10-CM | POA: Diagnosis not present

## 2014-03-06 DIAGNOSIS — F3289 Other specified depressive episodes: Secondary | ICD-10-CM | POA: Diagnosis not present

## 2014-03-06 DIAGNOSIS — F329 Major depressive disorder, single episode, unspecified: Secondary | ICD-10-CM | POA: Diagnosis not present

## 2014-03-06 DIAGNOSIS — IMO0002 Reserved for concepts with insufficient information to code with codable children: Secondary | ICD-10-CM | POA: Diagnosis not present

## 2014-03-06 DIAGNOSIS — F411 Generalized anxiety disorder: Secondary | ICD-10-CM | POA: Diagnosis not present

## 2014-03-06 DIAGNOSIS — R51 Headache: Secondary | ICD-10-CM | POA: Insufficient documentation

## 2014-03-06 DIAGNOSIS — G8929 Other chronic pain: Secondary | ICD-10-CM | POA: Insufficient documentation

## 2014-03-06 DIAGNOSIS — I10 Essential (primary) hypertension: Secondary | ICD-10-CM | POA: Diagnosis not present

## 2014-03-06 DIAGNOSIS — Z8719 Personal history of other diseases of the digestive system: Secondary | ICD-10-CM | POA: Insufficient documentation

## 2014-03-06 DIAGNOSIS — H53149 Visual discomfort, unspecified: Secondary | ICD-10-CM | POA: Diagnosis not present

## 2014-03-06 DIAGNOSIS — R519 Headache, unspecified: Secondary | ICD-10-CM

## 2014-03-06 MED ORDER — KETOROLAC TROMETHAMINE 60 MG/2ML IM SOLN
60.0000 mg | Freq: Once | INTRAMUSCULAR | Status: AC
  Administered 2014-03-06: 60 mg via INTRAMUSCULAR
  Filled 2014-03-06: qty 2

## 2014-03-06 MED ORDER — DIPHENHYDRAMINE HCL 25 MG PO CAPS
25.0000 mg | ORAL_CAPSULE | Freq: Once | ORAL | Status: AC
  Administered 2014-03-06: 25 mg via ORAL
  Filled 2014-03-06: qty 1

## 2014-03-06 MED ORDER — METOCLOPRAMIDE HCL 10 MG PO TABS
10.0000 mg | ORAL_TABLET | Freq: Once | ORAL | Status: AC
  Administered 2014-03-06: 10 mg via ORAL
  Filled 2014-03-06: qty 1

## 2014-03-06 MED ORDER — METOCLOPRAMIDE HCL 10 MG PO TABS: 10.0000 mg | ORAL_TABLET | Freq: Four times a day (QID) | ORAL | Status: DC | PRN

## 2014-03-06 NOTE — ED Notes (Signed)
Bed: ZO10WA05 Expected date:  Expected time:  Means of arrival:  Comments: EMS 70F headache x1 month
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
## 2014-03-06 NOTE — Discharge Instructions (Signed)

## 2014-03-08 ENCOUNTER — Encounter (HOSPITAL_COMMUNITY): Payer: Self-pay | Admitting: Emergency Medicine

## 2014-03-08 ENCOUNTER — Emergency Department (HOSPITAL_COMMUNITY)
Admission: EM | Admit: 2014-03-08 | Discharge: 2014-03-08 | Disposition: A | Discharge status: Home / Self Care | Payer: Medicare Other | Attending: Emergency Medicine | Admitting: Emergency Medicine

## 2014-03-08 ENCOUNTER — Emergency Department (HOSPITAL_COMMUNITY): Payer: Medicare Other

## 2014-03-08 DIAGNOSIS — J45909 Unspecified asthma, uncomplicated: Secondary | ICD-10-CM | POA: Diagnosis not present

## 2014-03-08 DIAGNOSIS — Z79899 Other long term (current) drug therapy: Secondary | ICD-10-CM | POA: Insufficient documentation

## 2014-03-08 DIAGNOSIS — F411 Generalized anxiety disorder: Secondary | ICD-10-CM | POA: Insufficient documentation

## 2014-03-08 DIAGNOSIS — E7800 Pure hypercholesterolemia, unspecified: Secondary | ICD-10-CM | POA: Diagnosis not present

## 2014-03-08 DIAGNOSIS — I10 Essential (primary) hypertension: Secondary | ICD-10-CM | POA: Insufficient documentation

## 2014-03-08 DIAGNOSIS — Z8719 Personal history of other diseases of the digestive system: Secondary | ICD-10-CM | POA: Diagnosis not present

## 2014-03-08 DIAGNOSIS — F319 Bipolar disorder, unspecified: Secondary | ICD-10-CM | POA: Insufficient documentation

## 2014-03-08 DIAGNOSIS — IMO0002 Reserved for concepts with insufficient information to code with codable children: Secondary | ICD-10-CM | POA: Diagnosis not present

## 2014-03-08 DIAGNOSIS — F17200 Nicotine dependence, unspecified, uncomplicated: Secondary | ICD-10-CM | POA: Diagnosis not present

## 2014-03-08 DIAGNOSIS — E119 Type 2 diabetes mellitus without complications: Secondary | ICD-10-CM | POA: Insufficient documentation

## 2014-03-08 DIAGNOSIS — Z8619 Personal history of other infectious and parasitic diseases: Secondary | ICD-10-CM | POA: Diagnosis not present

## 2014-03-08 DIAGNOSIS — R519 Headache, unspecified: Secondary | ICD-10-CM

## 2014-03-08 DIAGNOSIS — R51 Headache: Secondary | ICD-10-CM | POA: Diagnosis not present

## 2014-03-08 DIAGNOSIS — Z794 Long term (current) use of insulin: Secondary | ICD-10-CM | POA: Diagnosis not present

## 2014-03-08 MED ORDER — METOCLOPRAMIDE HCL 10 MG PO TABS
10.0000 mg | ORAL_TABLET | Freq: Once | ORAL | Status: AC
  Administered 2014-03-08: 10 mg via ORAL
  Filled 2014-03-08: qty 1

## 2014-03-08 MED ORDER — KETOROLAC TROMETHAMINE 30 MG/ML IJ SOLN
30.0000 mg | Freq: Once | INTRAMUSCULAR | Status: AC
  Administered 2014-03-08: 30 mg via INTRAMUSCULAR
  Filled 2014-03-08: qty 1

## 2014-03-08 NOTE — ED Provider Notes (Signed)
CSN: [id number]     Arrival date & time 03/08/14  0012 History   First MD Initiated Contact with Patient 03/08/14 0154     Chief Complaint  Patient presents with  . Headache     (Consider location/radiation/quality/duration/timing/severity/associated sxs/prior Treatment) HPI Comments: Patient reports, that she's had recurrent intermittent headaches for the past 3-4 months.  She normally takes ibuprofen at home.  States, in the past 2, days.  She's had headache, that will not, stop she's convinced that she has a brain tumor.  She did not fill the prescription for Reglan, that she received yesterday at discharge.  She has not seen her doctor for headaches  Patient is a 48 y.o. female presenting with headaches. The history is provided by the patient.  Headache Pain location:  Generalized Quality:  Unable to specify Radiates to:  Does not radiate Severity currently:  5/10 Severity at highest:  9/10 Onset quality:  Unable to specify Duration:  2 days Timing:  Intermittent Progression:  Worsening Chronicity:  Recurrent Similar to prior headaches: yes   Relieved by:  None tried Worsened by:  Nothing tried Ineffective treatments:  None tried Associated symptoms: no dizziness, no fever, no nausea, no neck pain, no photophobia, no sinus pressure, no sore throat and no vomiting     Past Medical History  Diagnosis Date  . Asthma   . Diabetes mellitus   . Gout   . High cholesterol   . Hypertension   . Hepatitis B infection   . Anxiety   . Depression   . Bipolar 1 disorder   . Liver cirrhosis    Past Surgical History  Procedure Laterality Date  . Cesarean section    . Tubal ligation    . Laparoscopy    . Biopsy of liver     Family History  Problem Relation Age of Onset  . Other Neg Hx    History  Substance Use Topics  . Smoking status: Current Some Day Smoker -- 0.25 packs/day    Types: Cigarettes  . Smokeless tobacco: Not on file  . Alcohol Use: 1.8 oz/week    3 Cans  of beer per week     Comment: ocassional   OB History   Grav Para Term Preterm Abortions TAB SAB Ect Mult Living   6 4 0 4 2 0 2  0 4     Review of Systems  Constitutional: Negative for fever and chills.  HENT: Negative for sinus pressure and sore throat.   Eyes: Negative for photophobia and visual disturbance.  Gastrointestinal: Negative for nausea and vomiting.  Musculoskeletal: Negative for neck pain.  Skin: Negative for rash.  Neurological: Positive for headaches. Negative for dizziness and weakness.  All other systems reviewed and are negative.     Allergies  Tylenol  Home Medications   Prior to Admission medications   Medication Sig Start Date End Date Taking? Authorizing Provider  albuterol (PROVENTIL HFA;VENTOLIN HFA) 108 (90 BASE) MCG/ACT inhaler Inhale 2 puffs into the lungs every 6 (six) hours as needed for wheezing or shortness of breath.    Yes Historical Provider, MD  Dexlansoprazole (DEXILANT PO) Take 1 tablet by mouth 2 (two) times daily. Samples given by Dr. Debarah CrapeMaygard   Yes Historical Provider, MD  diphenhydrAMINE (BENADRYL) 25 MG tablet Take 25 mg by mouth every 8 (eight) hours as needed for itching.    Yes Historical Provider, MD  fluticasone (FLONASE) 50 MCG/ACT nasal spray Place 2 sprays into both nostrils daily.  Yes Historical Provider, MD  furosemide (LASIX) 20 MG tablet Take 1 tablet by mouth daily. 01/27/14  Yes Historical Provider, MD  ibuprofen (ADVIL,MOTRIN) 800 MG tablet Take 800 mg by mouth every 6 (six) hours as needed for headache.    Yes Historical Provider, MD  insulin lispro (HUMALOG) 100 UNIT/ML injection Inject 2-5 Units into the skin 3 (three) times daily before meals. Sliding scale uses with humilin N .   Yes Historical Provider, MD  insulin regular (NOVOLIN R,HUMULIN R) 100 units/mL injection Inject 4-11 Units into the skin 3 (three) times daily before meals. Sliding scale uses with humalog as well     Yes Historical Provider, MD   mercaptopurine (PURINETHOL) 50 MG tablet Take 100 mg by mouth daily. Give on an empty stomach 1 hour before or 2 hours after meals. Caution: Chemotherapy.   Yes Historical Provider, MD  metFORMIN (GLUCOPHAGE) 1000 MG tablet Take 2,000 mg by mouth 2 (two) times daily with a meal.    Yes Historical Provider, MD  metoCLOPramide (REGLAN) 10 MG tablet Take 1 tablet (10 mg total) by mouth every 6 (six) hours as needed for nausea (nausea/headache). 03/06/14  Yes Loren Racer, MD  Omega-3 Fatty Acids (FISH OIL PO) Take 1 tablet by mouth daily.   Yes Historical Provider, MD  predniSONE (DELTASONE) 5 MG tablet Take 20 mg by mouth every morning.    Yes Historical Provider, MD  traZODone (DESYREL) 150 MG tablet Take 150 mg by mouth at bedtime.    Yes Historical Provider, MD  venlafaxine (EFFEXOR-XR) 150 MG 24 hr capsule Take 75-150 mg by mouth every evening.    Yes Historical Provider, MD   BP 92/61  Pulse 78  Temp(Src) 98.2 F (36.8 C) (Oral)  Resp 18  SpO2 94%  LMP 02/27/2014 Physical Exam  Nursing note and vitals reviewed. Constitutional: She is oriented to person, place, and time. She appears well-developed and well-nourished.  HENT:  Head: Normocephalic.  Right Ear: External ear normal.  Mouth/Throat: Oropharynx is clear and moist.  Eyes: Pupils are equal, round, and reactive to light.  Neck: Normal range of motion.  Pulmonary/Chest: Effort normal and breath sounds normal.  Musculoskeletal: Normal range of motion.  Lymphadenopathy:    She has no cervical adenopathy.  Neurological: She is alert and oriented to person, place, and time.  Skin: Skin is warm. No rash noted.    ED Course  Procedures (including critical care time) Labs Review Labs Reviewed - No data to display  Imaging Review Ct Head Wo Contrast  03/08/2014   CLINICAL DATA:  Intermittent headaches for 3 months.  EXAM: CT HEAD WITHOUT CONTRAST  TECHNIQUE: Contiguous axial images were obtained from the base of the skull  through the vertex without intravenous contrast.  COMPARISON:  CT of the head performed 12/01/2008, and MRI of the brain performed 06/22/2005  FINDINGS: There is no evidence of acute infarction, mass lesion, or intra- or extra-axial hemorrhage on CT.  The posterior fossa, including the cerebellum, brainstem and fourth ventricle, is within normal limits. The third and lateral ventricles, and basal ganglia are unremarkable in appearance. The cerebral hemispheres are symmetric in appearance, with normal gray-white differentiation. No mass effect or midline shift is seen.  There is no evidence of fracture; visualized osseous structures are unremarkable in appearance. Mild bilateral proptosis is noted. The paranasal sinuses and mastoid air cells are well-aerated. No significant soft tissue abnormalities are seen.  IMPRESSION: 1. No acute intracranial pathology seen on CT. 2. Mild  bilateral proptosis noted.   Electronically Signed   By: Roanna Raider M.D.   On: 03/08/2014 02:29     EKG Interpretation None      MDM  Head CT is negative.  Patient will be given by mouth Reglan, 10 mg and 30 mg of Toradol, IM and reassess  Head CT is normal.  Patient had total resolution of her headache with by mouth Reglan, and IM Toradol given, instructed to fill her prescription for Reglan, that she received yesterday Final diagnoses:  Nonintractable episodic headache, unspecified headache type         Arman Filter, NP 03/08/14 [phone]

## 2014-03-08 NOTE — ED Provider Notes (Signed)
Medical screening examination/treatment/procedure(s) were performed by non-physician practitioner and as supervising physician I was immediately available for consultation/collaboration.   EKG Interpretation None       Derwood KaplanAnkit Peri Kreft, MD 03/08/14 2309

## 2014-03-08 NOTE — ED Notes (Signed)
Pt's reports intermittent h/a x 3 months.  Was seen here recently for same and was told that she needs an MRI or CT done.  Pt is A&Ox4.  No obvious  Neuro deficits noted at this time.  Ambulatory without difficulty

## 2014-03-08 NOTE — ED Notes (Signed)
Pt is sleeping, (snoring),

## 2014-03-08 NOTE — ED Notes (Signed)
Pt transported from home with c/o headache onset yesterday. Pt seen at this facility yesterday for same

## 2014-03-08 NOTE — ED Notes (Signed)
Pt states that she has had a headache for 3 months with no relief. Sd her doctor prescribed flonase and when she told him it didn't help, he told her to go to the emergency dept so it could be more fully investigated. Pt states other symptoms include some runny nose but is convinced that this is not allergies and that something is wrong. States that her pain feels like high pressure behind her eyes. sd she takes ibuprofen with no relief. Pt weepy; says suffers from depression as well.

## 2014-03-08 NOTE — ED Notes (Signed)
Pt is sleeping soundly on left side,  NAD

## 2014-06-15 ENCOUNTER — Encounter (HOSPITAL_COMMUNITY): Payer: Self-pay | Admitting: Emergency Medicine

## 2014-07-15 ENCOUNTER — Emergency Department (HOSPITAL_COMMUNITY)
Admission: EM | Admit: 2014-07-15 | Discharge: 2014-07-15 | Discharge status: Against Medical Advice | Payer: Medicare Other | Attending: Emergency Medicine | Admitting: Emergency Medicine

## 2014-07-15 ENCOUNTER — Encounter (HOSPITAL_COMMUNITY): Payer: Self-pay | Admitting: Emergency Medicine

## 2014-07-15 DIAGNOSIS — J45909 Unspecified asthma, uncomplicated: Secondary | ICD-10-CM | POA: Insufficient documentation

## 2014-07-15 DIAGNOSIS — H578 Other specified disorders of eye and adnexa: Secondary | ICD-10-CM | POA: Insufficient documentation

## 2014-07-15 DIAGNOSIS — I10 Essential (primary) hypertension: Secondary | ICD-10-CM | POA: Insufficient documentation

## 2014-07-15 DIAGNOSIS — Z720 Tobacco use: Secondary | ICD-10-CM | POA: Insufficient documentation

## 2014-07-15 DIAGNOSIS — E119 Type 2 diabetes mellitus without complications: Secondary | ICD-10-CM | POA: Diagnosis not present

## 2014-07-15 NOTE — ED Notes (Signed)
Pt states she is not willing to wait to be seen, states she won't have a ride home later.

## 2014-07-15 NOTE — ED Notes (Signed)
Pt. reports left eye reddness with drainage / swelling onset 2 days ago , no blurred vision .

## 2014-07-17 ENCOUNTER — Emergency Department (HOSPITAL_COMMUNITY)
Admission: EM | Admit: 2014-07-17 | Discharge: 2014-07-17 | Disposition: A | Discharge status: Home / Self Care | Payer: Medicare Other | Attending: Emergency Medicine | Admitting: Emergency Medicine

## 2014-07-17 ENCOUNTER — Encounter (HOSPITAL_COMMUNITY): Payer: Self-pay | Admitting: Emergency Medicine

## 2014-07-17 DIAGNOSIS — Z79899 Other long term (current) drug therapy: Secondary | ICD-10-CM | POA: Insufficient documentation

## 2014-07-17 DIAGNOSIS — Z720 Tobacco use: Secondary | ICD-10-CM | POA: Diagnosis not present

## 2014-07-17 DIAGNOSIS — H00016 Hordeolum externum left eye, unspecified eyelid: Secondary | ICD-10-CM

## 2014-07-17 DIAGNOSIS — H00014 Hordeolum externum left upper eyelid: Secondary | ICD-10-CM | POA: Diagnosis not present

## 2014-07-17 DIAGNOSIS — Z3202 Encounter for pregnancy test, result negative: Secondary | ICD-10-CM | POA: Insufficient documentation

## 2014-07-17 DIAGNOSIS — N898 Other specified noninflammatory disorders of vagina: Secondary | ICD-10-CM | POA: Insufficient documentation

## 2014-07-17 DIAGNOSIS — Z794 Long term (current) use of insulin: Secondary | ICD-10-CM | POA: Insufficient documentation

## 2014-07-17 DIAGNOSIS — J45909 Unspecified asthma, uncomplicated: Secondary | ICD-10-CM | POA: Insufficient documentation

## 2014-07-17 DIAGNOSIS — Z7951 Long term (current) use of inhaled steroids: Secondary | ICD-10-CM | POA: Insufficient documentation

## 2014-07-17 DIAGNOSIS — F319 Bipolar disorder, unspecified: Secondary | ICD-10-CM | POA: Diagnosis not present

## 2014-07-17 DIAGNOSIS — Z8719 Personal history of other diseases of the digestive system: Secondary | ICD-10-CM | POA: Diagnosis not present

## 2014-07-17 DIAGNOSIS — M109 Gout, unspecified: Secondary | ICD-10-CM | POA: Insufficient documentation

## 2014-07-17 DIAGNOSIS — Z8619 Personal history of other infectious and parasitic diseases: Secondary | ICD-10-CM | POA: Diagnosis not present

## 2014-07-17 DIAGNOSIS — F419 Anxiety disorder, unspecified: Secondary | ICD-10-CM | POA: Diagnosis not present

## 2014-07-17 DIAGNOSIS — I10 Essential (primary) hypertension: Secondary | ICD-10-CM | POA: Diagnosis not present

## 2014-07-17 DIAGNOSIS — E119 Type 2 diabetes mellitus without complications: Secondary | ICD-10-CM | POA: Insufficient documentation

## 2014-07-17 DIAGNOSIS — E780 Pure hypercholesterolemia: Secondary | ICD-10-CM | POA: Diagnosis not present

## 2014-07-17 LAB — WET PREP, GENITAL
CLUE CELLS WET PREP: NONE SEEN
TRICH WET PREP: NONE SEEN
Yeast Wet Prep HPF POC: NONE SEEN

## 2014-07-17 LAB — URINALYSIS, ROUTINE W REFLEX MICROSCOPIC
BILIRUBIN URINE: NEGATIVE
GLUCOSE, UA: NEGATIVE mg/dL
Hgb urine dipstick: NEGATIVE
Ketones, ur: NEGATIVE mg/dL
LEUKOCYTES UA: NEGATIVE
Nitrite: NEGATIVE
PH: 6 (ref 5.0–8.0)
Protein, ur: NEGATIVE mg/dL
Specific Gravity, Urine: 1.013 (ref 1.005–1.030)
Urobilinogen, UA: 1 mg/dL (ref 0.0–1.0)

## 2014-07-17 LAB — POC URINE PREG, ED: Preg Test, Ur: NEGATIVE

## 2014-07-17 MED ORDER — ERYTHROMYCIN 5 MG/GM OP OINT
TOPICAL_OINTMENT | Freq: Once | OPHTHALMIC | Status: AC
  Administered 2014-07-17: 1 via OPHTHALMIC

## 2014-07-17 NOTE — Discharge Instructions (Signed)
Continue to apply warm compress to your left eyelid several times daily.  Apply a ribbon of ointment to the lower lids of both eyes every 6 hrs for the next 5 days.  Follow up closely with your doctor for further care.  You will be contacted if you tested positive for infection.    Sty A sty (hordeolum) is an infection of a gland in the eyelid located at the base of the eyelash. A sty may develop a white or yellow head of pus. It can be puffy (swollen). Usually, the sty will burst and pus will come out on its own. They do not leave lumps in the eyelid once they drain. A sty is often confused with another form of cyst of the eyelid called a chalazion. Chalazions occur within the eyelid and not on the edge where the bases of the eyelashes are. They often are red, sore and then form firm lumps in the eyelid. CAUSES   Germs (bacteria).  Lasting (chronic) eyelid inflammation. SYMPTOMS   Tenderness, redness and swelling along the edge of the eyelid at the base of the eyelashes.  Sometimes, there is a white or yellow head of pus. It may or may not drain. DIAGNOSIS  An ophthalmologist will be able to distinguish between a sty and a chalazion and treat the condition appropriately.  TREATMENT   Styes are typically treated with warm packs (compresses) until drainage occurs.  In rare cases, medicines that kill germs (antibiotics) may be prescribed. These antibiotics may be in the form of drops, cream or pills.  If a hard lump has formed, it is generally necessary to do a small incision and remove the hardened contents of the cyst in a minor surgical procedure done in the office.  In suspicious cases, your caregiver may send the contents of the cyst to the lab to be certain that it is not a rare, but dangerous form of cancer of the glands of the eyelid. HOME CARE INSTRUCTIONS   Wash your hands often and dry them with a clean towel. Avoid touching your eyelid. This may spread the infection to other  parts of the eye.  Apply heat to your eyelid for 10 to 20 minutes, several times a day, to ease pain and help to heal it faster.  Do not squeeze the sty. Allow it to drain on its own. Wash your eyelid carefully 3 to 4 times per day to remove any pus. SEEK IMMEDIATE MEDICAL CARE IF:   Your eye becomes painful or puffy (swollen).  Your vision changes.  Your sty does not drain by itself within 3 days.  Your sty comes back within a short period of time, even with treatment.  You have redness (inflammation) around the eye.  You have a fever. Document Released: 05/10/2005 Document Revised: 10/23/2011 Document Reviewed: 11/14/2013 [facility] Patient Information 2015 [employer], [place]. This information is not intended to replace advice given to you by your health care provider. Make sure you discuss any questions you have with your health care provider.

## 2014-07-17 NOTE — ED Notes (Signed)
Pt has many complaints. They are the following: Left eye swelling, tiching and pain x 3 days, pain rating 8/10. Dysuria, lots of pressure when she urinates Blood in stool. Pt states that she thought it was bc it was hard and she had to strain but has since had more blood in stool. Pt also c/o vaginal discharge for past several days and abd pain rating 10/10

## 2014-07-17 NOTE — ED Provider Notes (Signed)
CSN: [id number]     Arrival date & time 07/17/14  [id number] History   First MD Initiated Contact with Patient 07/17/14 0825     No chief complaint on file.    (Consider location/radiation/quality/duration/timing/severity/associated sxs/prior Treatment) HPI   48 year old female with history of bipolar, anxiety, hepatitis B, insulin-dependent diabetes presents with multiple complaints. Patient reports for the past week she has had persistent vaginal irritation and vaginal discharge. She described the discharge as a cause cheese appearance. She also complaining of discomfort with sexual activities. Complaining of increased urinary frequency and increased pressure with urinating. She also noticed that her left eyelid is swollen and itchy with pain for the past 3 days. She rates the pain as 8 out of 10. Furthermore she also complaining of blood in the stools especially when she wipes. States she has been constipated and has to strain when having bowel movement. She denies any fever, chills, headache, neck pain, chest pain, shortness of breath, productive cough, or rash. She admits to having prior history of STD including Verenial disease and gonorrhea. She also admits to having history of recurrent yeast infection and vaginal vaginosis. She does have a primary care doctor but was unable to follow-up until next week.  No specific treatment tried. She denies any blurred vision.  Past Medical History  Diagnosis Date  . Asthma   . Diabetes mellitus   . Gout   . High cholesterol   . Hypertension   . Hepatitis B infection   . Anxiety   . Depression   . Bipolar 1 disorder   . Liver cirrhosis    Past Surgical History  Procedure Laterality Date  . Cesarean section    . Tubal ligation    . Laparoscopy    . Biopsy of liver     Family History  Problem Relation Age of Onset  . Other Neg Hx    History  Substance Use Topics  . Smoking status: Current Some Day Smoker -- 0.25 packs/day    Types:  Cigarettes  . Smokeless tobacco: Not on file  . Alcohol Use: 1.8 oz/week    3 Cans of beer per week     Comment: ocassional   OB History    Gravida Para Term Preterm AB TAB SAB Ectopic Multiple Living   6 4 0 4 2 0 2  0 4     Review of Systems  All other systems reviewed and are negative.     Allergies  Tylenol  Home Medications   Prior to Admission medications   Medication Sig Start Date End Date Taking? Authorizing Provider  albuterol (PROVENTIL HFA;VENTOLIN HFA) 108 (90 BASE) MCG/ACT inhaler Inhale 2 puffs into the lungs every 6 (six) hours as needed for wheezing or shortness of breath.     Historical Provider, MD  Dexlansoprazole (DEXILANT PO) Take 1 tablet by mouth 2 (two) times daily. Samples given by Dr. Debarah CrapeMaygard    Historical Provider, MD  diphenhydrAMINE (BENADRYL) 25 MG tablet Take 25 mg by mouth every 8 (eight) hours as needed for itching.     Historical Provider, MD  fluticasone (FLONASE) 50 MCG/ACT nasal spray Place 2 sprays into both nostrils daily.    Historical Provider, MD  furosemide (LASIX) 20 MG tablet Take 1 tablet by mouth daily. 01/27/14   Historical Provider, MD  ibuprofen (ADVIL,MOTRIN) 800 MG tablet Take 800 mg by mouth every 6 (six) hours as needed for headache.     Historical Provider, MD  insulin lispro (HUMALOG)  100 UNIT/ML injection Inject 2-5 Units into the skin 3 (three) times daily before meals. Sliding scale uses with humilin N .    Historical Provider, MD  insulin regular (NOVOLIN R,HUMULIN R) 100 units/mL injection Inject 4-11 Units into the skin 3 (three) times daily before meals. Sliding scale uses with humalog as well      Historical Provider, MD  mercaptopurine (PURINETHOL) 50 MG tablet Take 100 mg by mouth daily. Give on an empty stomach 1 hour before or 2 hours after meals. Caution: Chemotherapy.    Historical Provider, MD  metFORMIN (GLUCOPHAGE) 1000 MG tablet Take 2,000 mg by mouth 2 (two) times daily with a meal.     Historical Provider,  MD  metoCLOPramide (REGLAN) 10 MG tablet Take 1 tablet (10 mg total) by mouth every 6 (six) hours as needed for nausea (nausea/headache). 03/06/14   Loren Racer, MD  Omega-3 Fatty Acids (FISH OIL PO) Take 1 tablet by mouth daily.    Historical Provider, MD  predniSONE (DELTASONE) 5 MG tablet Take 20 mg by mouth every morning.     Historical Provider, MD  traZODone (DESYREL) 150 MG tablet Take 150 mg by mouth at bedtime.     Historical Provider, MD  venlafaxine (EFFEXOR-XR) 150 MG 24 hr capsule Take 75-150 mg by mouth every evening.     Historical Provider, MD   LMP 06/15/2014 Physical Exam  Constitutional: She appears well-developed and well-nourished. No distress.  HENT:  Head: Atraumatic.  Eyes: Conjunctivae and EOM are normal. Pupils are equal, round, and reactive to light. Right eye exhibits no chemosis, no discharge and no hordeolum. Left eye exhibits hordeolum. Left eye exhibits no chemosis and no discharge. Right conjunctiva is not injected. Left conjunctiva is not injected. No scleral icterus.  Neck: Neck supple.  Abdominal: Soft. Bowel sounds are normal. She exhibits no distension. There is no tenderness.  Genitourinary:  Chaperone present:  Normal external genitalia without rash. No inguinal lymphadenopathy. No significant discomfort with insertion of speculum. Normal vaginal vault with thin clear discharge. Cervical os is visualized and closed. On bimanual examination patient has mild tenderness to left adnexal but no cervical motion tenderness.  Neurological: She is alert.  Skin: No rash noted.  Psychiatric: She has a normal mood and affect.  Nursing note and vitals reviewed.   ED Course  Procedures (including critical care time)  8:57 AM Patient here with multiple complaints. She appears to have a stye in her left upper eyelid. She has had this in the past but today she requests full antibiotic. No specific treatment tried therefore recommend using warm moist compress  regularly. No significant change in her visual acuity.  Patient also complaining of vaginal discharge and vaginal irritation, pelvic examination reveals no evidence concerning for PID. Workup initiated.  9:57 AM Wet prep unremarkable.  Cultures sent.  Erythromycin ointment for eyes.  Pt to f/u with PCP for further care.    Labs Review Labs Reviewed  WET PREP, GENITAL - Abnormal; Notable for the following:    WBC, Wet Prep HPF POC FEW (*)    All other components within normal limits  URINALYSIS, ROUTINE W REFLEX MICROSCOPIC - Abnormal; Notable for the following:    APPearance CLOUDY (*)    All other components within normal limits  GC/CHLAMYDIA PROBE AMP  POC URINE PREG, ED    Imaging Review No results found.   EKG Interpretation None      MDM   Final diagnoses:  Stye external, left  Vaginal discharge    BP 151/96 mmHg  Pulse 69  Temp(Src) 97.9 F (36.6 C) (Oral)  Resp 19  SpO2 100%  LMP 06/15/2014     Fayrene HelperBowie Attilio Zeitler, PA-C 07/17/14 0957  Raeford RazorStephen Kohut, MD 07/24/14 [id number]

## 2014-07-18 LAB — GC/CHLAMYDIA PROBE AMP
CT PROBE, AMP APTIMA: NEGATIVE
GC Probe RNA: NEGATIVE

## 2014-11-01 ENCOUNTER — Emergency Department (HOSPITAL_COMMUNITY): Payer: Medicare Other

## 2014-11-01 ENCOUNTER — Emergency Department (HOSPITAL_COMMUNITY)
Admission: EM | Admit: 2014-11-01 | Discharge: 2014-11-01 | Disposition: A | Discharge status: Home / Self Care | Payer: Medicare Other | Attending: Emergency Medicine | Admitting: Emergency Medicine

## 2014-11-01 ENCOUNTER — Encounter (HOSPITAL_COMMUNITY): Payer: Self-pay | Admitting: *Deleted

## 2014-11-01 DIAGNOSIS — F329 Major depressive disorder, single episode, unspecified: Secondary | ICD-10-CM | POA: Diagnosis not present

## 2014-11-01 DIAGNOSIS — J45909 Unspecified asthma, uncomplicated: Secondary | ICD-10-CM | POA: Diagnosis not present

## 2014-11-01 DIAGNOSIS — L731 Pseudofolliculitis barbae: Secondary | ICD-10-CM | POA: Diagnosis not present

## 2014-11-01 DIAGNOSIS — F419 Anxiety disorder, unspecified: Secondary | ICD-10-CM | POA: Diagnosis not present

## 2014-11-01 DIAGNOSIS — M79672 Pain in left foot: Secondary | ICD-10-CM

## 2014-11-01 DIAGNOSIS — E1140 Type 2 diabetes mellitus with diabetic neuropathy, unspecified: Secondary | ICD-10-CM | POA: Insufficient documentation

## 2014-11-01 DIAGNOSIS — K529 Noninfective gastroenteritis and colitis, unspecified: Secondary | ICD-10-CM | POA: Insufficient documentation

## 2014-11-01 DIAGNOSIS — Z79899 Other long term (current) drug therapy: Secondary | ICD-10-CM | POA: Insufficient documentation

## 2014-11-01 DIAGNOSIS — Z8619 Personal history of other infectious and parasitic diseases: Secondary | ICD-10-CM | POA: Insufficient documentation

## 2014-11-01 DIAGNOSIS — Z794 Long term (current) use of insulin: Secondary | ICD-10-CM | POA: Diagnosis not present

## 2014-11-01 DIAGNOSIS — Z720 Tobacco use: Secondary | ICD-10-CM | POA: Insufficient documentation

## 2014-11-01 DIAGNOSIS — Z7951 Long term (current) use of inhaled steroids: Secondary | ICD-10-CM | POA: Diagnosis not present

## 2014-11-01 DIAGNOSIS — R197 Diarrhea, unspecified: Secondary | ICD-10-CM

## 2014-11-01 DIAGNOSIS — Z7952 Long term (current) use of systemic steroids: Secondary | ICD-10-CM | POA: Insufficient documentation

## 2014-11-01 DIAGNOSIS — I10 Essential (primary) hypertension: Secondary | ICD-10-CM | POA: Diagnosis not present

## 2014-11-01 DIAGNOSIS — M109 Gout, unspecified: Secondary | ICD-10-CM | POA: Diagnosis not present

## 2014-11-01 DIAGNOSIS — R112 Nausea with vomiting, unspecified: Secondary | ICD-10-CM

## 2014-11-01 LAB — CBG MONITORING, ED: Glucose-Capillary: 165 mg/dL — ABNORMAL HIGH (ref 70–99)

## 2014-11-01 MED ORDER — GI COCKTAIL ~~LOC~~
30.0000 mL | Freq: Once | ORAL | Status: AC
  Administered 2014-11-01: 30 mL via ORAL
  Filled 2014-11-01: qty 30

## 2014-11-01 MED ORDER — TRAMADOL HCL 50 MG PO TABS
50.0000 mg | ORAL_TABLET | Freq: Once | ORAL | Status: AC
  Administered 2014-11-01: 50 mg via ORAL
  Filled 2014-11-01: qty 1

## 2014-11-01 MED ORDER — ONDANSETRON 8 MG PO TBDP
8.0000 mg | ORAL_TABLET | Freq: Once | ORAL | Status: AC
  Administered 2014-11-01: 8 mg via ORAL
  Filled 2014-11-01: qty 1

## 2014-11-01 MED ORDER — FAMOTIDINE 20 MG PO TABS
20.0000 mg | ORAL_TABLET | Freq: Once | ORAL | Status: AC
  Administered 2014-11-01: 20 mg via ORAL
  Filled 2014-11-01: qty 1

## 2014-11-01 MED ORDER — TRAMADOL HCL 50 MG PO TABS: 50.0000 mg | ORAL_TABLET | Freq: Four times a day (QID) | ORAL | Status: DC | PRN

## 2014-11-01 NOTE — ED Provider Notes (Signed)
CSN: [id number]     Arrival date & time 11/01/14  1214 History   First MD Initiated Contact with Patient 11/01/14 1448     Chief Complaint  Patient presents with  . Nausea  . Diarrhea     (Consider location/radiation/quality/duration/timing/severity/associated sxs/prior Treatment) The history is provided by the patient.  pt c/o nausea and diarrhea in the past day. Several watery stools. No vomiting. States family member w recent similar c/o. No abd pain. No fever or chills. No dysuria or gu c/o. No back or flank pain. Denies cough or uri c/o. Pt also states has noticed a very small sore area right axilla, mildly painful.  no hx abscess/mrsa. No erythema to area. Also c/o left foot pain for 3 months. Intermittent, sharp, shooting. Usually lasts seconds but occasionally longer. At rest. No relation to walking or activity. No trauma to foot. Hx iddm. Denies hx neuropathy. No calf pain or claudication.      Past Medical History  Diagnosis Date  . Asthma   . Diabetes mellitus   . Gout   . High cholesterol   . Hypertension   . Hepatitis B infection   . Anxiety   . Depression   . Bipolar 1 disorder   . Liver cirrhosis    Past Surgical History  Procedure Laterality Date  . Cesarean section    . Tubal ligation    . Laparoscopy    . Biopsy of liver     Family History  Problem Relation Age of Onset  . Other Neg Hx    History  Substance Use Topics  . Smoking status: Current Some Day Smoker -- 0.25 packs/day    Types: Cigarettes  . Smokeless tobacco: Not on file  . Alcohol Use: 1.8 oz/week    3 Cans of beer per week     Comment: ocassional   OB History    Gravida Para Term Preterm AB TAB SAB Ectopic Multiple Living   6 4 0 4 2 0 2  0 4     Review of Systems  Constitutional: Negative for fever and chills.  Eyes: Negative for redness.  Respiratory: Negative for cough and shortness of breath.   Cardiovascular: Negative for chest pain and leg swelling.  Gastrointestinal:  Positive for nausea and diarrhea. Negative for vomiting, abdominal pain and abdominal distention.  Genitourinary: Negative for dysuria and flank pain.  Musculoskeletal: Negative for back pain and neck pain.  Skin: Negative for rash.  Neurological: Negative for weakness, numbness and headaches.  Hematological: Does not bruise/bleed easily.  Psychiatric/Behavioral: Negative for confusion.      Allergies  Tylenol  Home Medications   Prior to Admission medications   Medication Sig Start Date End Date Taking? Authorizing Provider  albuterol (PROVENTIL HFA;VENTOLIN HFA) 108 (90 BASE) MCG/ACT inhaler Inhale 2 puffs into the lungs 4 (four) times daily as needed for wheezing or shortness of breath.    Yes Historical Provider, MD  dexlansoprazole (DEXILANT) 60 MG capsule Take 60 mg by mouth daily.   Yes Historical Provider, MD  diphenhydrAMINE (BENADRYL) 25 MG tablet Take 25 mg by mouth every 8 (eight) hours as needed for itching.    Yes Historical Provider, MD  fluticasone (FLONASE) 50 MCG/ACT nasal spray Place 2 sprays into both nostrils daily as needed for allergies or rhinitis.    Yes Historical Provider, MD  furosemide (LASIX) 20 MG tablet Take 20 mg by mouth daily.  01/27/14  Yes Historical Provider, MD  ibuprofen (ADVIL,MOTRIN) 800 MG  tablet Take 800 mg by mouth every 8 (eight) hours as needed for moderate pain.   Yes Historical Provider, MD  insulin lispro (HUMALOG) 100 UNIT/ML injection Inject 1-11 Units into the skin 3 (three) times daily before meals. Sliding scale uses with humilin N .   Yes Historical Provider, MD  insulin NPH Human (HUMULIN N,NOVOLIN N) 100 UNIT/ML injection Inject 30 Units into the skin 2 (two) times daily as needed (>400 High Sugar).    Yes Historical Provider, MD  mercaptopurine (PURINETHOL) 50 MG tablet Take 75 mg by mouth 2 (two) times daily. Give on an empty stomach 1 hour before or 2 hours after meals. Caution: Chemotherapy.   Yes Historical Provider, MD   metFORMIN (GLUCOPHAGE) 1000 MG tablet Take 2,000 mg by mouth 2 (two) times daily with a meal.    Yes Historical Provider, MD  Omega-3 Fatty Acids (FISH OIL PO) Take 1 capsule by mouth daily.    Yes Historical Provider, MD  predniSONE (DELTASONE) 5 MG tablet Take 15 mg by mouth daily.    Yes Historical Provider, MD  traZODone (DESYREL) 100 MG tablet Take 150-200 mg by mouth 2 (two) times daily.   Yes Historical Provider, MD  Venlafaxine HCl 225 MG TB24 Take 225 mg by mouth daily.   Yes Historical Provider, MD   BP 124/90 mmHg  Pulse 89  Temp(Src) 98.3 F (36.8 C) (Oral)  Resp 18  SpO2 100% Physical Exam  Constitutional: She is oriented to person, place, and time. She appears well-developed and well-nourished. No distress.  HENT:  Mouth/Throat: Oropharynx is clear and moist.  Eyes: Conjunctivae are normal. No scleral icterus.  Neck: Neck supple. No tracheal deviation present.  Cardiovascular: Normal rate, regular rhythm, normal heart sounds and intact distal pulses.   Pulmonary/Chest: Effort normal and breath sounds normal. No respiratory distress.  Abdominal: Soft. Normal appearance and bowel sounds are normal. She exhibits no distension. There is no tenderness.  Musculoskeletal: She exhibits no edema.  Tiny, approx 2 mm area induration right axilla ?ingrown hair. No drainable abscess. No cellulitis. No mass or l/a.   Left foot w tenderness medially. No skin changes or erythema. No sts. Foot is of normal color and warmth. Dp/pt 2+. Normal cap refill distally in toes.   Neurological: She is alert and oriented to person, place, and time.  Steady gait  Skin: Skin is warm and dry. No rash noted.  Psychiatric: She has a normal mood and affect.  Nursing note and vitals reviewed.   ED Course  Procedures (including critical care time) Labs Review  Results for orders placed or performed during the hospital encounter of 11/01/14  POC CBG, ED  Result Value Ref Range   Glucose-Capillary  165 (H) 70 - 99 mg/dL   Dg Foot Complete Left  11/01/2014   CLINICAL DATA:  Pain in left foot at medial aspect x 2-3 months with no known trauma; pt is diabetic; swelling and blaknening of skin on medial side around calcaneous  EXAM: LEFT FOOT - COMPLETE 3+ VIEW  COMPARISON:  None.  FINDINGS: No fracture. Joints are normally spaced and aligned. No arthropathic change.  Small plantar calcaneal spur.  Soft tissues are unremarkable.  IMPRESSION: No fracture or acute finding. No arthropathic change. Small plantar calcaneal spur.   Electronically Signed   By: Amie Portland M.D.   On: 11/01/2014 15:46        MDM   Gi cocktail. pepcid po. Zofran.  Po fuids.  Reviewed nursing notes  and prior charts for additional history.   Recheck no vomiting in ED. abd soft nt. Pt tolerating po fluids.  Pt currently appears stable for d/c.  Discussed close pcp f/u. Return precautions provided.      Cathren Laine, MD 11/01/14 [id number]

## 2014-11-01 NOTE — ED Notes (Signed)
Pt transported to XRAY °

## 2014-11-01 NOTE — Discharge Instructions (Signed)
It was our pleasure to provide your ER care today - we hope that you feel better.  For ingrown hair, keep area very clean. Warm compresses to sore area.  Return if worse, increased swelling or spreading redness.  For recent gi symptoms, rest, drink plenty of fluids. Follow up with primary care doctor, or here, for recheck in 1-2 days if symptoms fail to improve/resolve.  For foot pain, follow up with your primary care doctor.  You may take ultram as need for pain - no driving when taking.  Return to ER if worse, new symptoms, fevers, persistent vomiting, severe abdominal pain, other concern.    Viral Gastroenteritis Viral gastroenteritis is also known as stomach flu. This condition affects the stomach and intestinal tract. It can cause sudden diarrhea and vomiting. The illness typically lasts 3 to 8 days. Most people develop an immune response that eventually gets rid of the virus. While this natural response develops, the virus can make you quite ill. CAUSES  Many different viruses can cause gastroenteritis, such as rotavirus or noroviruses. You can catch one of these viruses by consuming contaminated food or water. You may also catch a virus by sharing utensils or other personal items with an infected person or by touching a contaminated surface. SYMPTOMS  The most common symptoms are diarrhea and vomiting. These problems can cause a severe loss of body fluids (dehydration) and a body salt (electrolyte) imbalance. Other symptoms may include:  Fever.  Headache.  Fatigue.  Abdominal pain. DIAGNOSIS  Your caregiver can usually diagnose viral gastroenteritis based on your symptoms and a physical exam. A stool sample may also be taken to test for the presence of viruses or other infections. TREATMENT  This illness typically goes away on its own. Treatments are aimed at rehydration. The most serious cases of viral gastroenteritis involve vomiting so severely that you are not able to keep  fluids down. In these cases, fluids must be given through an intravenous line (IV). HOME CARE INSTRUCTIONS   Drink enough fluids to keep your urine clear or pale yellow. Drink small amounts of fluids frequently and increase the amounts as tolerated.  Ask your caregiver for specific rehydration instructions.  Avoid:  Foods high in sugar.  Alcohol.  Carbonated drinks.  Tobacco.  Juice.  Caffeine drinks.  Extremely hot or cold fluids.  Fatty, greasy foods.  Too much intake of anything at one time.  Dairy products until 24 to 48 hours after diarrhea stops.  You may consume probiotics. Probiotics are active cultures of beneficial bacteria. They may lessen the amount and number of diarrheal stools in adults. Probiotics can be found in yogurt with active cultures and in supplements.  Wash your hands well to avoid spreading the virus.  Only take over-the-counter or prescription medicines for pain, discomfort, or fever as directed by your caregiver. Do not give aspirin to children. Antidiarrheal medicines are not recommended.  Ask your caregiver if you should continue to take your regular prescribed and over-the-counter medicines.  Keep all follow-up appointments as directed by your caregiver. SEEK IMMEDIATE MEDICAL CARE IF:   You are unable to keep fluids down.  You do not urinate at least once every 6 to 8 hours.  You develop shortness of breath.  You notice blood in your stool or vomit. This may look like coffee grounds.  You have abdominal pain that increases or is concentrated in one small area (localized).  You have persistent vomiting or diarrhea.  You have a fever.  The patient is a child younger than 3 months, and he or she has a fever.  The patient is a child older than 3 months, and he or she has a fever and persistent symptoms.  The patient is a child older than 3 months, and he or she has a fever and symptoms suddenly get worse.  The patient is a baby,  and he or she has no tears when crying. MAKE SURE YOU:   Understand these instructions.  Will watch your condition.  Will get help right away if you are not doing well or get worse. Document Released: 07/31/2005 Document Revised: 10/23/2011 Document Reviewed: 05/17/2011 [facility] Patient Information 2015 [place], [place]. This information is not intended to replace advice given to you by your health care provider. Make sure you discuss any questions you have with your health care provider.    Nausea and Vomiting Nausea is a sick feeling that often comes before throwing up (vomiting). Vomiting is a reflex where stomach contents come out of your mouth. Vomiting can cause severe loss of body fluids (dehydration). Children and elderly adults can become dehydrated quickly, especially if they also have diarrhea. Nausea and vomiting are symptoms of a condition or disease. It is important to find the cause of your symptoms. CAUSES   Direct irritation of the stomach lining. This irritation can result from increased acid production (gastroesophageal reflux disease), infection, food poisoning, taking certain medicines (such as nonsteroidal anti-inflammatory drugs), alcohol use, or tobacco use.  Signals from the brain.These signals could be caused by a headache, heat exposure, an inner ear disturbance, increased pressure in the brain from injury, infection, a tumor, or a concussion, pain, emotional stimulus, or metabolic problems.  An obstruction in the gastrointestinal tract (bowel obstruction).  Illnesses such as diabetes, hepatitis, gallbladder problems, appendicitis, kidney problems, cancer, sepsis, atypical symptoms of a heart attack, or eating disorders.  Medical treatments such as chemotherapy and radiation.  Receiving medicine that makes you sleep (general anesthetic) during surgery. DIAGNOSIS Your caregiver may ask for tests to be done if the problems do not improve after a few days. Tests  may also be done if symptoms are severe or if the reason for the nausea and vomiting is not clear. Tests may include:  Urine tests.  Blood tests.  Stool tests.  Cultures (to look for evidence of infection).  X-rays or other imaging studies. Test results can help your caregiver make decisions about treatment or the need for additional tests. TREATMENT You need to stay well hydrated. Drink frequently but in small amounts.You may wish to drink water, sports drinks, clear broth, or eat frozen ice pops or gelatin dessert to help stay hydrated.When you eat, eating slowly may help prevent nausea.There are also some antinausea medicines that may help prevent nausea. HOME CARE INSTRUCTIONS   Take all medicine as directed by your caregiver.  If you do not have an appetite, do not force yourself to eat. However, you must continue to drink fluids.  If you have an appetite, eat a normal diet unless your caregiver tells you differently.  Eat a variety of complex carbohydrates (rice, wheat, potatoes, bread), lean meats, yogurt, fruits, and vegetables.  Avoid high-fat foods because they are more difficult to digest.  Drink enough water and fluids to keep your urine clear or pale yellow.  If you are dehydrated, ask your caregiver for specific rehydration instructions. Signs of dehydration may include:  Severe thirst.  Dry lips and mouth.  Dizziness.  Dark urine.  Decreasing urine frequency and amount.  Confusion.  Rapid breathing or pulse. SEEK IMMEDIATE MEDICAL CARE IF:   You have blood or brown flecks (like coffee grounds) in your vomit.  You have black or bloody stools.  You have a severe headache or stiff neck.  You are confused.  You have severe abdominal pain.  You have chest pain or trouble breathing.  You do not urinate at least once every 8 hours.  You develop cold or clammy skin.  You continue to vomit for longer than 24 to 48 hours.  You have a fever. MAKE  SURE YOU:   Understand these instructions.  Will watch your condition.  Will get help right away if you are not doing well or get worse. Document Released: 07/31/2005 Document Revised: 10/23/2011 Document Reviewed: 12/28/2010 [facility] Patient Information 2015 [place], [place]. This information is not intended to replace advice given to you by your health care provider. Make sure you discuss any questions you have with your health care provider.    Diarrhea Diarrhea is frequent loose and watery bowel movements. It can cause you to feel weak and dehydrated. Dehydration can cause you to become tired and thirsty, have a dry mouth, and have decreased urination that often is dark yellow. Diarrhea is a sign of another problem, most often an infection that will not last long. In most cases, diarrhea typically lasts 2-3 days. However, it can last longer if it is a sign of something more serious. It is important to treat your diarrhea as directed by your caregiver to lessen or prevent future episodes of diarrhea. CAUSES  Some common causes include:  Gastrointestinal infections caused by viruses, bacteria, or parasites.  Food poisoning or food allergies.  Certain medicines, such as antibiotics, chemotherapy, and laxatives.  Artificial sweeteners and fructose.  Digestive disorders. HOME CARE INSTRUCTIONS  Ensure adequate fluid intake (hydration): Have 1 cup (8 oz) of fluid for each diarrhea episode. Avoid fluids that contain simple sugars or sports drinks, fruit juices, whole milk products, and sodas. Your urine should be clear or pale yellow if you are drinking enough fluids. Hydrate with an oral rehydration solution that you can purchase at pharmacies, retail stores, and online. You can prepare an oral rehydration solution at home by mixing the following ingredients together:   - tsp table salt.   tsp baking soda.   tsp salt substitute containing potassium chloride.  1  tablespoons  sugar.  1 L (34 oz) of water.  Certain foods and beverages may increase the speed at which food moves through the gastrointestinal (GI) tract. These foods and beverages should be avoided and include:  Caffeinated and alcoholic beverages.  High-fiber foods, such as raw fruits and vegetables, nuts, seeds, and whole grain breads and cereals.  Foods and beverages sweetened with sugar alcohols, such as xylitol, sorbitol, and mannitol.  Some foods may be well tolerated and may help thicken stool including:  Starchy foods, such as rice, toast, pasta, low-sugar cereal, oatmeal, grits, baked potatoes, crackers, and bagels.  Bananas.  Applesauce.  Add probiotic-rich foods to help increase healthy bacteria in the GI tract, such as yogurt and fermented milk products.  Wash your hands well after each diarrhea episode.  Only take over-the-counter or prescription medicines as directed by your caregiver.  Take a warm bath to relieve any burning or pain from frequent diarrhea episodes. SEEK IMMEDIATE MEDICAL CARE IF:   You are unable to keep fluids down.  You have persistent vomiting.  You have  blood in your stool, or your stools are black and tarry.  You do not urinate in 6-8 hours, or there is only a small amount of very dark urine.  You have abdominal pain that increases or localizes.  You have weakness, dizziness, confusion, or light-headedness.  You have a severe headache.  Your diarrhea gets worse or does not get better.  You have a fever or persistent symptoms for more than 2-3 days.  You have a fever and your symptoms suddenly get worse. MAKE SURE YOU:   Understand these instructions.  Will watch your condition.  Will get help right away if you are not doing well or get worse. Document Released: 07/21/2002 Document Revised: 12/15/2013 Document Reviewed: 04/07/2012 [facility] Patient Information 2015 [place], [place]. This information is not intended to replace advice given  to you by your health care provider. Make sure you discuss any questions you have with your health care provider.    Ingrown Hair An ingrown hair is a hair that curls and re-enters the skin instead of growing straight out of the skin. It happens most often with curly hair. It is usually more severe in the neck area, but it can occur in any shaved area, including the beard area, groin, scalp, and legs. An ingrown hair may cause small pockets of infection. CAUSES  Shaving closely, tweezing, or waxing, especially curly hair. Using hair removal creams can sometimes lead to ingrown hairs, especially in the groin. SYMPTOMS   Small bumps on the skin. The bumps may be filled with pus.  Pain.  Itching. DIAGNOSIS  Your caregiver can usually tell what is wrong by doing a physical exam. TREATMENT  If there is a severe infection, your caregiver may prescribe antibiotic medicines. Laser hair removal may also be done to help prevent regrowth of the hair. HOME CARE INSTRUCTIONS   Do not shave irritated skin. You may start shaving again once the irritation has gone away.  If you are prone to ingrown hairs, consider not shaving as much as possible.  If antibiotics are prescribed, take them as directed. Finish them even if you start to feel better.  You may use a facial sponge in a gentle circular motion to help dislodge ingrown hairs on the face.  You may use a hair removal cream weekly, especially on the legs and underarms. Stop using the cream if it irritates your skin. Use caution when using hair removal creams in the groin area. SHAVING INSTRUCTIONS AFTER TREATMENT  Shower before shaving. Keep areas to be shaved packed in warm, moist wraps for several minutes before shaving. The warm, moist environment helps soften the hairs and makes ingrown hairs less likely to occur.  Use thick shaving gels.  Use a bump fighter razor that cuts hair slightly above the skin level or use an electric shaver with  a longer shave setting.  Shave in the direction of hair growth. Avoid making multiple razor strokes.  Use moisturizing lotions after shaving. Document Released: 11/06/2000 Document Revised: 01/30/2012 Document Reviewed: 10/31/2011 [facility] Patient Information 2015 [place], [place]. This information is not intended to replace advice given to you by your health care provider. Make sure you discuss any questions you have with your health care provider.    Diabetic Neuropathy Diabetic neuropathy is a nerve disease or nerve damage that is caused by diabetes mellitus. About half of all people with diabetes mellitus have some form of nerve damage. Nerve damage is more common in those who have had diabetes mellitus for  many years and who generally have not had good control of their blood sugar (glucose) level. Diabetic neuropathy is a common complication of diabetes mellitus. There are three more common types of diabetic neuropathy and a fourth type that is less common and less understood:   Peripheral neuropathy--This is the most common type of diabetic neuropathy. It causes damage to the nerves of the feet and legs first and then eventually the hands and arms.The damage affects the ability to sense touch.  Autonomic neuropathy--This type causes damage to the autonomic nervous system, which controls the following functions:  Heartbeat.  Body temperature.  Blood pressure.  Urination.  Digestion.  Sweating.  Sexual function.  Focal neuropathy--Focal neuropathy can be painful and unpredictable and occurs most often in older adults with diabetes mellitus. It involves a specific nerve or one area and often comes on suddenly. It usually does not cause long-term problems.  Radiculoplexus neuropathy-- Sometimes called lumbosacral radiculoplexus neuropathy, radiculoplexus neuropathy affects the nerves of the thighs, hips, buttocks, or legs. It is more common in people with type 2 diabetes mellitus  and in older men. It is characterized by debilitating pain, weakness, and atrophy, usually in the thigh muscles. CAUSES  The cause of peripheral, autonomic, and focal neuropathies is diabetes mellitus that is uncontrolled and high glucose levels. The cause of radiculoplexus neuropathy is unknown. However, it is thought to be caused by inflammation related to uncontrolled glucose levels. SIGNS AND SYMPTOMS  Peripheral Neuropathy Peripheral neuropathy develops slowly over time. When the nerves of the feet and legs no longer work there may be:   Burning, stabbing, or aching pain in the legs or feet.  Inability to feel pressure or pain in your feet. This can lead to:  Thick calluses over pressure areas.  Pressure sores.  Ulcers.  Foot deformities.  Reduced ability to feel temperature changes.  Muscle weakness. Autonomic Neuropathy The symptoms of autonomic neuropathy vary depending on which nerves are affected. Symptoms may include:  Problems with digestion, such as:  Feeling sick to your stomach (nausea).  Vomiting.  Bloating.  Constipation.  Diarrhea.  Abdominal pain.  Difficulty with urination. This occurs if you lose your ability to sense when your bladder is full. Problems include:  Urine leakage (incontinence).  Inability to empty your bladder completely (retention).  Rapid or irregular heartbeat (palpitations).  Blood pressure drops when you stand up (orthostatic hypotension). When you stand up you may feel:  Dizzy.  Weak.  Faint.  In men, inability to attain and maintain an erection.  In women, vaginal dryness and problems with decreased sexual desire and arousal.  Problems with body temperature regulation.  Increased or decreased sweating. Focal Neuropathy  Abnormal eye movements or abnormal alignment of both eyes.  Weakness in the wrist.  Foot drop. This results in an inability to lift the foot properly and abnormal walking or foot  movement.  Paralysis on one side of your face (Bell palsy).  Chest or abdominal pain. Radiculoplexus Neuropathy  Sudden, severe pain in your hip, thigh, or buttocks.  Weakness and wasting of thigh muscles.  Difficulty rising from a seated position.  Abdominal swelling.  Unexplained weight loss (usually more than 10 lb [4.5 kg]). DIAGNOSIS  Peripheral Neuropathy Your senses may be tested. Sensory function testing can be done with:  A light touch using a monofilament.  A vibration with tuning fork.  A sharp sensation with a pin prick. Other tests that can help diagnose neuropathy are:  Nerve conduction velocity. This  test checks the transmission of an electrical current through a nerve.  Electromyography. This shows how muscles respond to electrical signals transmitted by nearby nerves.  Quantitative sensory testing. This is used to assess how your nerves respond to vibrations and changes in temperature. Autonomic Neuropathy Diagnosis is often based on reported symptoms. Tell your health care provider if you experience:   Dizziness.   Constipation.   Diarrhea.   Inappropriate urination or inability to urinate.   Inability to get or maintain an erection.  Tests that may be done include:   Electrocardiography or Holter monitor. These are tests that can help show problems with the heart rate or heart rhythm.   An X-ray exam may be done. Focal Neuropathy Diagnosis is made based on your symptoms and what your health care provider finds during your exam. Other tests may be done. They may include:  Nerve conduction velocities. This checks the transmission of electrical current through a nerve.  Electromyography. This shows how muscles respond to electrical signals transmitted by nearby nerves.  Quantitative sensory testing. This test is used to assess how your nerves respond to vibration and changes in temperature. Radiculoplexus Neuropathy  Often the first  thing is to eliminate any other issue or problems that might be the cause, as there is no stick test for diagnosis.  X-ray exam of your spine and lumbar region.  Spinal tap to rule out cancer.  MRI to rule out other lesions. TREATMENT  Once nerve damage occurs, it cannot be reversed. The goal of treatment is to keep the disease or nerve damage from getting worse and affecting more nerve fibers. Controlling your blood glucose level is the key. Most people with radiculoplexus neuropathy see at least a partial improvement over time. You will need to keep your blood glucose and HbA1c levels in the target range determined by your health care provider. Things that help control blood glucose levels include:   Blood glucose monitoring.   Meal planning.   Physical activity.   Diabetes medicine.  Over time, maintaining lower blood glucose levels helps lessen symptoms. Sometimes, prescription pain medicine is needed. HOME CARE INSTRUCTIONS:  Do not smoke.  Keep your blood glucose level in the range that you and your health care provider have determined acceptable for you.  Keep your blood pressure level in the range that you and your health care provider have determined acceptable for you.  Eat a well-balanced diet.  Be active every day.  Check your feet every day. SEEK MEDICAL CARE IF:   You have burning, stabbing, or aching pain in the legs or feet.  You are unable to feel pressure or pain in your feet.  You develop problems with digestion such as:  Nausea.  Vomiting.  Bloating.  Constipation.  Diarrhea.  Abdominal pain.  You have difficulty with urination, such as:  Incontinence.  Retention.  You have palpitations.  You develop orthostatic hypotension. When you stand up you may feel:  Dizzy.  Weak.  Faint.  You cannot attain and maintain an erection (in men).  You have vaginal dryness and problems with decreased sexual desire and arousal (in  women).  You have severe pain in your thighs, legs, or buttocks.  You have unexplained weight loss. Document Released: 10/09/2001 Document Revised: 05/21/2013 Document Reviewed: 01/09/2013 [facility] Patient Information 2015 [place], [place]. This information is not intended to replace advice given to you by your health care provider. Make sure you discuss any questions you have with your health care  provider.

## 2014-11-01 NOTE — ED Notes (Signed)
MD at bedside. 

## 2014-11-01 NOTE — ED Notes (Signed)
Pt c/o abscess in right axilla, diarrhea and abdominal pain and also sts her left foot has been very painful for the last couple of months and is "turning black" which concern pt since she is diabetic.

## 2014-11-01 NOTE — ED Notes (Signed)
Pt alert,oriented, and ambulatory upon DC she was advised to follow up with PCP in 2 days. She leaves with DC papers and prescription in hand.

## 2014-11-01 NOTE — ED Notes (Signed)
MS Steinl at bedside

## 2015-01-21 ENCOUNTER — Encounter (HOSPITAL_COMMUNITY): Payer: Self-pay | Admitting: *Deleted

## 2015-01-21 ENCOUNTER — Emergency Department (HOSPITAL_COMMUNITY)
Admission: EM | Admit: 2015-01-21 | Discharge: 2015-01-21 | Disposition: A | Discharge status: Home / Self Care | Payer: Medicare Other | Attending: Emergency Medicine | Admitting: Emergency Medicine

## 2015-01-21 DIAGNOSIS — E119 Type 2 diabetes mellitus without complications: Secondary | ICD-10-CM | POA: Diagnosis not present

## 2015-01-21 DIAGNOSIS — Z7952 Long term (current) use of systemic steroids: Secondary | ICD-10-CM | POA: Insufficient documentation

## 2015-01-21 DIAGNOSIS — Z8719 Personal history of other diseases of the digestive system: Secondary | ICD-10-CM | POA: Diagnosis not present

## 2015-01-21 DIAGNOSIS — M25561 Pain in right knee: Secondary | ICD-10-CM | POA: Diagnosis not present

## 2015-01-21 DIAGNOSIS — F319 Bipolar disorder, unspecified: Secondary | ICD-10-CM | POA: Diagnosis not present

## 2015-01-21 DIAGNOSIS — M25562 Pain in left knee: Secondary | ICD-10-CM | POA: Insufficient documentation

## 2015-01-21 DIAGNOSIS — Z720 Tobacco use: Secondary | ICD-10-CM | POA: Insufficient documentation

## 2015-01-21 DIAGNOSIS — Z794 Long term (current) use of insulin: Secondary | ICD-10-CM | POA: Insufficient documentation

## 2015-01-21 DIAGNOSIS — Z8619 Personal history of other infectious and parasitic diseases: Secondary | ICD-10-CM | POA: Diagnosis not present

## 2015-01-21 DIAGNOSIS — Z8669 Personal history of other diseases of the nervous system and sense organs: Secondary | ICD-10-CM | POA: Insufficient documentation

## 2015-01-21 DIAGNOSIS — Z79899 Other long term (current) drug therapy: Secondary | ICD-10-CM | POA: Diagnosis not present

## 2015-01-21 DIAGNOSIS — J45909 Unspecified asthma, uncomplicated: Secondary | ICD-10-CM | POA: Insufficient documentation

## 2015-01-21 DIAGNOSIS — R609 Edema, unspecified: Secondary | ICD-10-CM | POA: Insufficient documentation

## 2015-01-21 DIAGNOSIS — E782 Mixed hyperlipidemia: Secondary | ICD-10-CM | POA: Diagnosis not present

## 2015-01-21 DIAGNOSIS — G8929 Other chronic pain: Secondary | ICD-10-CM | POA: Insufficient documentation

## 2015-01-21 DIAGNOSIS — M7989 Other specified soft tissue disorders: Secondary | ICD-10-CM | POA: Diagnosis present

## 2015-01-21 DIAGNOSIS — I10 Essential (primary) hypertension: Secondary | ICD-10-CM | POA: Diagnosis not present

## 2015-01-21 DIAGNOSIS — M25569 Pain in unspecified knee: Secondary | ICD-10-CM

## 2015-01-21 HISTORY — DX: Family history of other specified conditions: Z84.89

## 2015-01-21 HISTORY — DX: Polyneuropathy, unspecified: G62.9

## 2015-01-21 HISTORY — DX: Family history of other disabilities and chronic diseases leading to disablement, not elsewhere classified: Z82.8

## 2015-01-21 MED ORDER — TRAMADOL HCL 50 MG PO TABS: 50.0000 mg | ORAL_TABLET | Freq: Four times a day (QID) | ORAL | Status: DC | PRN

## 2015-01-21 NOTE — Discharge Instructions (Signed)
Arthralgia °Arthralgia is joint pain. A joint is a place where two bones meet. Joint pain can happen for many reasons. The joint can be bruised, stiff, infected, or weak from aging. Pain usually goes away after resting and taking medicine for soreness.  °HOME CARE °· Rest the joint as told by your doctor. °· Keep the sore joint raised (elevated) for the first 24 hours. °· Put ice on the joint area. °· Put ice in a plastic bag. °· Place a towel between your skin and the bag. °· Leave the ice on for 15-20 minutes, 03-04 times a day. °· Wear your splint, casting, elastic bandage, or sling as told by your doctor. °· Only take medicine as told by your doctor. Do not take aspirin. °· Use crutches as told by your doctor. Do not put weight on the joint until told to by your doctor. °GET HELP RIGHT AWAY IF:  °· You have bruising, puffiness (swelling), or more pain. °· Your fingers or toes turn blue or start to lose feeling (numb). °· Your medicine does not lessen the pain. °· Your pain becomes severe. °· You have a temperature by mouth above 102° F (38.9° C), not controlled by medicine. °· You cannot move or use the joint. °MAKE SURE YOU:  °· Understand these instructions. °· Will watch your condition. °· Will get help right away if you are not doing well or get worse. °Document Released: 07/19/2009 Document Revised: 10/23/2011 Document Reviewed: 07/19/2009 °Exit[employer]® Patient Information ©2015 [employer]. This information is not intended to replace advice given to you by your health care provider. Make sure you discuss any questions you have with your health care provider. ° °Arthritis, Nonspecific °Arthritis is pain, redness, warmth, or puffiness (inflammation) of a joint. The joint may be stiff or hurt when you move it. One or more joints may be affected. There are many types of arthritis. Your doctor may not know what type you have right away. The most common cause of arthritis is wear and tear on the joint  (osteoarthritis). °HOME CARE  °· Only take medicine as told by your doctor. °· Rest the joint as much as possible. °· Raise (elevate) your joint if it is puffy. °· Use crutches if the painful joint is in your leg. °· Drink enough fluids to keep your pee (urine) clear or pale yellow. °· Follow your doctor's diet instructions. °· Use cold packs for very bad joint pain for 10 to 15 minutes every hour. Ask your doctor if it is okay for you to use hot packs. °· Exercise as told by your doctor. °· Take a warm shower if you have stiffness in the morning. °· Move your sore joints throughout the day. °GET HELP RIGHT AWAY IF:  °· You have a fever. °· You have very bad joint pain, puffiness, or redness. °· You have many joints that are painful and puffy. °· You are not getting better with treatment. °· You have very bad back pain or leg weakness. °· You cannot control when you poop (bowel movement) or pee (urinate). °· You do not feel better in 24 hours or are getting worse. °· You are having side effects from your medicine. °MAKE SURE YOU:  °· Understand these instructions. °· Will watch your condition. °· Will get help right away if you are not doing well or get worse. °Document Released: 10/25/2009 Document Revised: 01/30/2012 Document Reviewed: 10/25/2009 °Exit[employer]® Patient Information ©2015 [employer]. This information is not intended to replace   advice given to you by your health care provider. Make sure you discuss any questions you have with your health care provider. ° °

## 2015-01-21 NOTE — ED Provider Notes (Signed)
CSN: [id number]     Arrival date & time 01/21/15  1710 History   First MD Initiated Contact with Patient 01/21/15 1814     Chief Complaint  Patient presents with  . Leg Swelling     (Consider location/radiation/quality/duration/timing/severity/associated sxs/prior Treatment) Patient is a 49 y.o. female presenting with knee pain.  Knee Pain Location:  Knee Time since incident: several years. Injury: no   Knee location:  L knee and R knee Pain details:    Quality:  Aching   Radiates to:  Does not radiate   Severity:  Moderate   Onset quality:  Gradual   Duration: several years.   Timing:  Constant   Progression:  Worsening Chronicity:  Chronic Dislocation: no   Relieved by:  None tried Worsened by:  Activity and bearing weight Ineffective treatments:  Rest Associated symptoms: swelling   Associated symptoms: no back pain, no decreased ROM, no fatigue, no fever, no itching, no muscle weakness, no numbness, no stiffness and no tingling     Past Medical History  Diagnosis Date  . Asthma   . Diabetes mellitus   . Gout   . High cholesterol   . Hypertension   . Hepatitis B infection   . Anxiety   . Depression   . Bipolar 1 disorder   . Liver cirrhosis   . Neuropathy   . FH: chemotherapy    Past Surgical History  Procedure Laterality Date  . Cesarean section    . Tubal ligation    . Laparoscopy    . Biopsy of liver     Family History  Problem Relation Age of Onset  . Other Neg Hx    History  Substance Use Topics  . Smoking status: Current Some Day Smoker -- 0.25 packs/day    Types: Cigarettes  . Smokeless tobacco: Not on file  . Alcohol Use: No     Comment: ocassional   OB History    Gravida Para Term Preterm AB TAB SAB Ectopic Multiple Living   6 4 0 4 2 0 2  0 4     Review of Systems  Constitutional: Negative for fever, chills, appetite change and fatigue.  HENT: Negative for congestion, ear pain, facial swelling, mouth sores and sore throat.   Eyes:  Negative for visual disturbance.  Respiratory: Negative for cough, chest tightness and shortness of breath.   Cardiovascular: Negative for chest pain and palpitations.  Gastrointestinal: Negative for nausea, vomiting, abdominal pain, diarrhea and blood in stool.  Endocrine: Negative for cold intolerance and heat intolerance.  Genitourinary: Negative for frequency, decreased urine volume and difficulty urinating.  Musculoskeletal: Negative for back pain, stiffness and neck stiffness.  Skin: Negative for itching and rash.  Neurological: Negative for dizziness, weakness, light-headedness and headaches.  All other systems reviewed and are negative.     Allergies  Tylenol  Home Medications   Prior to Admission medications   Medication Sig Start Date End Date Taking? Authorizing Provider  albuterol (PROVENTIL HFA;VENTOLIN HFA) 108 (90 BASE) MCG/ACT inhaler Inhale 2 puffs into the lungs 4 (four) times daily as needed for wheezing or shortness of breath.    Yes Historical Provider, MD  dexlansoprazole (DEXILANT) 60 MG capsule Take 60 mg by mouth daily.   Yes Historical Provider, MD  diphenhydrAMINE (BENADRYL) 25 MG tablet Take 25 mg by mouth every 8 (eight) hours as needed for itching.    Yes Historical Provider, MD  fluticasone (FLONASE) 50 MCG/ACT nasal spray Place 2 sprays  into both nostrils daily as needed for allergies or rhinitis.    Yes Historical Provider, MD  furosemide (LASIX) 20 MG tablet Take 20 mg by mouth daily.  01/27/14  Yes Historical Provider, MD  ibuprofen (ADVIL,MOTRIN) 800 MG tablet Take 800 mg by mouth every 8 (eight) hours as needed for moderate pain.   Yes Historical Provider, MD  insulin lispro (HUMALOG) 100 UNIT/ML injection Inject 1-11 Units into the skin 3 (three) times daily before meals. Sliding scale uses with humilin N .   Yes Historical Provider, MD  insulin NPH Human (HUMULIN N,NOVOLIN N) 100 UNIT/ML injection Inject 30 Units into the skin 2 (two) times daily as  needed (>400 High Sugar).    Yes Historical Provider, MD  mercaptopurine (PURINETHOL) 50 MG tablet Take 75 mg by mouth 2 (two) times daily. Give on an empty stomach 1 hour before or 2 hours after meals. Caution: Chemotherapy.   Yes Historical Provider, MD  metFORMIN (GLUCOPHAGE) 1000 MG tablet Take 2,000 mg by mouth 2 (two) times daily with a meal.    Yes Historical Provider, MD  Omega-3 Fatty Acids (FISH OIL PO) Take 1 capsule by mouth daily.    Yes Historical Provider, MD  predniSONE (DELTASONE) 5 MG tablet Take 15 mg by mouth daily.    Yes Historical Provider, MD  traZODone (DESYREL) 100 MG tablet Take 150-200 mg by mouth at bedtime.    Yes Historical Provider, MD  Venlafaxine HCl 225 MG TB24 Take 225 mg by mouth daily.   Yes Historical Provider, MD  traMADol (ULTRAM) 50 MG tablet Take 1 tablet (50 mg total) by mouth every 6 (six) hours as needed. 01/21/15   Drema Pry, MD   BP 143/87 mmHg  Pulse 72  Temp(Src) 98 F (36.7 C) (Oral)  Resp 16  Wt 232 lb 6.4 oz (105.416 kg)  SpO2 100%  LMP 01/20/2015 Physical Exam  Constitutional: She is oriented to person, place, and time. She appears well-developed and well-nourished. No distress.  HENT:  Head: Normocephalic and atraumatic.  Right Ear: External ear normal.  Left Ear: External ear normal.  Nose: Nose normal.  Eyes: Conjunctivae and EOM are normal. Pupils are equal, round, and reactive to light. Right eye exhibits no discharge. Left eye exhibits no discharge. No scleral icterus.  Neck: Normal range of motion. Neck supple.  Cardiovascular: Normal rate, regular rhythm and normal heart sounds.  Exam reveals no gallop and no friction rub.   No murmur heard. Pulmonary/Chest: Effort normal and breath sounds normal. No stridor. No respiratory distress. She has no wheezes.  Abdominal: Soft. She exhibits no distension. There is no tenderness.  Musculoskeletal: She exhibits no edema or tenderness.       Right knee: She exhibits no effusion and  no erythema.       Left knee: She exhibits no effusion and no erythema.       Right foot: There is swelling.       Left foot: There is swelling.  Neurological: She is alert and oriented to person, place, and time.  Skin: Skin is warm and dry. No rash noted. She is not diaphoretic. No erythema.  Psychiatric: She has a normal mood and affect.    ED Course  Procedures (including critical care time) Labs Review Labs Reviewed - No data to display  Imaging Review No results found.   EKG Interpretation None      MDM    49 year old female with hypertension, diabetes, gout, chronic osteoarthritis of the  knees presents with worsening bilateral knee pain and leg swelling. Patient has run out of her tramadol and has not been able to follow up with her PCP. She was scheduled for orthopedic follow-up however she missed several appointments. She denies any recent injuries or trauma. She is low pretest probability for DVT. Low concern for CHF or hyperproteinemia.   No indication for imaging or lab analysis. Patient in agreement and will be provided with numbers for new PCP and orthopedic surgery. Patient discharged in good condition with strict return precautions. Patient follow-up with PCP and orthopedic surgery as needed.  Patient seen in conjunction with Dr. Radford Pax.  Deniece Portela, M.D. Resident Final diagnoses:  Chronic knee pain, unspecified laterality  Edema        Drema Pry, MD 01/22/15 0140  Nelva Nay, MD 01/24/15 2059

## 2015-01-21 NOTE — ED Notes (Signed)
Pt c/o bil LE edema x 1 month.  She came in today b/c she cannot tolerate the pain to her L foot.  She is supposed to have surgery on her L knee, but [facility] won't see her anymore b/c she's missed too many appointments d/t having to take care of her grandchildren.

## 2015-01-27 ENCOUNTER — Telehealth (HOSPITAL_COMMUNITY): Payer: Self-pay

## 2015-05-04 ENCOUNTER — Emergency Department (HOSPITAL_COMMUNITY): Payer: Medicare Other

## 2015-05-04 ENCOUNTER — Emergency Department (HOSPITAL_COMMUNITY)
Admission: EM | Admit: 2015-05-04 | Discharge: 2015-05-04 | Disposition: A | Discharge status: Home / Self Care | Payer: Medicare Other | Attending: Emergency Medicine | Admitting: Emergency Medicine

## 2015-05-04 ENCOUNTER — Encounter (HOSPITAL_COMMUNITY): Payer: Self-pay | Admitting: Emergency Medicine

## 2015-05-04 ENCOUNTER — Telehealth (HOSPITAL_COMMUNITY): Payer: Self-pay

## 2015-05-04 DIAGNOSIS — N898 Other specified noninflammatory disorders of vagina: Secondary | ICD-10-CM | POA: Diagnosis not present

## 2015-05-04 DIAGNOSIS — Z720 Tobacco use: Secondary | ICD-10-CM | POA: Insufficient documentation

## 2015-05-04 DIAGNOSIS — M109 Gout, unspecified: Secondary | ICD-10-CM | POA: Diagnosis not present

## 2015-05-04 DIAGNOSIS — E119 Type 2 diabetes mellitus without complications: Secondary | ICD-10-CM | POA: Diagnosis not present

## 2015-05-04 DIAGNOSIS — Z79899 Other long term (current) drug therapy: Secondary | ICD-10-CM | POA: Diagnosis not present

## 2015-05-04 DIAGNOSIS — R0602 Shortness of breath: Secondary | ICD-10-CM

## 2015-05-04 DIAGNOSIS — F319 Bipolar disorder, unspecified: Secondary | ICD-10-CM | POA: Insufficient documentation

## 2015-05-04 DIAGNOSIS — Z794 Long term (current) use of insulin: Secondary | ICD-10-CM | POA: Insufficient documentation

## 2015-05-04 DIAGNOSIS — J45909 Unspecified asthma, uncomplicated: Secondary | ICD-10-CM | POA: Insufficient documentation

## 2015-05-04 DIAGNOSIS — Z8619 Personal history of other infectious and parasitic diseases: Secondary | ICD-10-CM | POA: Insufficient documentation

## 2015-05-04 DIAGNOSIS — I10 Essential (primary) hypertension: Secondary | ICD-10-CM | POA: Diagnosis not present

## 2015-05-04 LAB — CBC WITH DIFFERENTIAL/PLATELET
BASOS ABS: 0 10*3/uL (ref 0.0–0.1)
Basophils Relative: 0 %
EOS PCT: 2 %
Eosinophils Absolute: 0.2 10*3/uL (ref 0.0–0.7)
HCT: 36 % (ref 36.0–46.0)
HEMOGLOBIN: 11.7 g/dL — AB (ref 12.0–15.0)
LYMPHS ABS: 3.1 10*3/uL (ref 0.7–4.0)
LYMPHS PCT: 26 %
MCH: 31.7 pg (ref 26.0–34.0)
MCHC: 32.5 g/dL (ref 30.0–36.0)
MCV: 97.6 fL (ref 78.0–100.0)
Monocytes Absolute: 0.4 10*3/uL (ref 0.1–1.0)
Monocytes Relative: 3 %
NEUTROS PCT: 69 %
Neutro Abs: 8 10*3/uL — ABNORMAL HIGH (ref 1.7–7.7)
PLATELETS: 483 10*3/uL — AB (ref 150–400)
RBC: 3.69 MIL/uL — AB (ref 3.87–5.11)
RDW: 15.4 % (ref 11.5–15.5)
WBC: 11.7 10*3/uL — AB (ref 4.0–10.5)

## 2015-05-04 LAB — WET PREP, GENITAL
TRICH WET PREP: NONE SEEN
WBC WET PREP: NONE SEEN
YEAST WET PREP: NONE SEEN

## 2015-05-04 LAB — COMPREHENSIVE METABOLIC PANEL
ALK PHOS: 35 U/L — AB (ref 38–126)
ALT: 21 U/L (ref 14–54)
AST: 20 U/L (ref 15–41)
Albumin: 3.8 g/dL (ref 3.5–5.0)
Anion gap: 13 (ref 5–15)
BUN: 15 mg/dL (ref 6–20)
CALCIUM: 8.9 mg/dL (ref 8.9–10.3)
CHLORIDE: 101 mmol/L (ref 101–111)
CO2: 23 mmol/L (ref 22–32)
CREATININE: 0.88 mg/dL (ref 0.44–1.00)
GFR calc Af Amer: >60 mL/min (ref >60)
Glucose, Bld: 277 mg/dL — ABNORMAL HIGH (ref 65–99)
Potassium: 3.8 mmol/L (ref 3.5–5.1)
SODIUM: 137 mmol/L (ref 135–145)
Total Bilirubin: 0.5 mg/dL (ref 0.3–1.2)
Total Protein: 7.5 g/dL (ref 6.5–8.1)

## 2015-05-04 LAB — HIV ANTIBODY (ROUTINE TESTING W REFLEX): HIV Screen 4th Generation wRfx: NONREACTIVE

## 2015-05-04 LAB — RPR: RPR Ser Ql: NONREACTIVE

## 2015-05-04 LAB — GC/CHLAMYDIA PROBE AMP (~~LOC~~) NOT AT ARMC
Chlamydia: NEGATIVE
Neisseria Gonorrhea: NEGATIVE

## 2015-05-04 MED ORDER — ALBUTEROL SULFATE HFA 108 (90 BASE) MCG/ACT IN AERS
2.0000 | INHALATION_SPRAY | Freq: Once | RESPIRATORY_TRACT | Status: AC
  Administered 2015-05-04: 2 via RESPIRATORY_TRACT
  Filled 2015-05-04: qty 6.7

## 2015-05-04 NOTE — ED Notes (Signed)
The patient said she has been having SOB since earlier today.  She says she has cirhosis of the liver and thinks it is her liver enzymes.  She says she has asthma but it is not asthma she knows it is from her liver.  The patient also said she had a "bump with a head on it" and she  Popped it.  She says she has a discharge and a rash and she feels like she is on "fire.

## 2015-05-04 NOTE — Discharge Instructions (Signed)
Your labs and chest xray did not show any concerning findings. Use an albuterol inhaler, 2 puffs every 4-6 hours as needed, for symptoms. Follow up with your primary care doctor for a recheck of symptoms the week's end. Return to the ED as needed if symptoms worsen.  Shortness of Breath Shortness of breath means you have trouble breathing. Shortness of breath needs medical care right away. HOME CARE   Do not smoke.  Avoid being around chemicals or things (paint fumes, dust) that may bother your breathing.  Rest as needed. Slowly begin your normal activities.  Only take medicines as told by your doctor.  Keep all doctor visits as told. GET HELP RIGHT AWAY IF:   Your shortness of breath gets worse.  You feel lightheaded, pass out (faint), or have a cough that is not helped by medicine.  You cough up blood.  You have pain with breathing.  You have pain in your chest, arms, shoulders, or belly (abdomen).  You have a fever.  You cannot walk up stairs or exercise the way you normally do.  You do not get better in the time expected.  You have a hard time doing normal activities even with rest.  You have problems with your medicines.  You have any new symptoms. MAKE SURE YOU:  Understand these instructions.  Will watch your condition.  Will get help right away if you are not doing well or get worse. Document Released: 01/17/2008 Document Revised: 08/05/2013 Document Reviewed: 10/16/2011 [facility] Patient Information 2015 [place], [place]. This information is not intended to replace advice given to you by your health care provider. Make sure you discuss any questions you have with your health care provider.

## 2015-05-04 NOTE — ED Notes (Signed)
Pt maintained SpO2 between 98-100% while ambulating through hall.

## 2015-05-04 NOTE — ED Notes (Signed)
Pelvic cart at bedside. 

## 2015-05-04 NOTE — ED Notes (Signed)
MD at bedside. 

## 2015-05-04 NOTE — ED Provider Notes (Signed)
CSN: [id number]     Arrival date & time 05/04/15  0055 History  This chart was scribed for Antony Madura, PA-C working with Zadie Rhine, MD by Evon Slack, ED Scribe. This patient was seen in room A02C/A02C and the patient's care was started at 3:34 AM.     Chief Complaint  Patient presents with  . Shortness of Breath    The patient said she has been having SOB since earlier today.  She says she has cirhosis of the liver and thinks it is her liver enzymes.  She says she has asthma but it is not asthma she knows it is from her liver.     Patient is a 49 y.o. female presenting with shortness of breath. The history is provided by the patient. No language interpreter was used.  Shortness of Breath Associated symptoms: no chest pain, no cough, no fever and no wheezing    HPI Comments: Hannah Young is a 49 y.o. female with a hx of DM, dyslipidemia, HTN, and autoimmune hepatitis (on mercaptopurine) who presents to the Emergency Department complaining of SOB onset yesterday AM. She describes her SOB as it being hard to breathe, felt only in her neck. She reports, "the last time I got like this I was admitted and my liver enzymes were up". Pt states she wants to have her liver enzymes checked. She has not taken any medications today for her symptoms. The patient denies fever, cough, chest tightness, wheezing, CP, leg swelling, inability to swallow, or drooling.  PCP - Dr. Ronne Binning She reports previously being followed by Dr. Darnelle Catalan who prescribed her chemo agent, but she didn't like him so she stopped seeing him 1 year ago. She states this chemo agent is now prescribed by her PCP.   Past Medical History  Diagnosis Date  . Asthma   . Diabetes mellitus   . Gout   . High cholesterol   . Hypertension   . Hepatitis B infection   . Anxiety   . Depression   . Bipolar 1 disorder   . Liver cirrhosis   . Neuropathy   . FH: chemotherapy    Past Surgical History  Procedure Laterality Date   . Cesarean section    . Tubal ligation    . Laparoscopy    . Biopsy of liver     Family History  Problem Relation Age of Onset  . Other Neg Hx    Social History  Substance Use Topics  . Smoking status: Current Some Day Smoker -- 0.25 packs/day    Types: Cigarettes  . Smokeless tobacco: None  . Alcohol Use: No     Comment: ocassional   OB History    Gravida Para Term Preterm AB TAB SAB Ectopic Multiple Living   6 4 0 4 2 0 2  0 4      Review of Systems  Constitutional: Negative for fever.  HENT: Negative for drooling.   Respiratory: Positive for shortness of breath. Negative for cough, chest tightness and wheezing.   Cardiovascular: Negative for chest pain.  All other systems reviewed and are negative.   Allergies  Tylenol  Home Medications   Prior to Admission medications   Medication Sig Start Date End Date Taking? Authorizing Provider  albuterol (PROVENTIL HFA;VENTOLIN HFA) 108 (90 BASE) MCG/ACT inhaler Inhale 2 puffs into the lungs 4 (four) times daily as needed for wheezing or shortness of breath.     Historical Provider, MD  dexlansoprazole (DEXILANT) 60 MG capsule  Take 60 mg by mouth daily.    Historical Provider, MD  diphenhydrAMINE (BENADRYL) 25 MG tablet Take 25 mg by mouth every 8 (eight) hours as needed for itching.     Historical Provider, MD  fluticasone (FLONASE) 50 MCG/ACT nasal spray Place 2 sprays into both nostrils daily as needed for allergies or rhinitis.     Historical Provider, MD  furosemide (LASIX) 20 MG tablet Take 20 mg by mouth daily.  01/27/14   Historical Provider, MD  ibuprofen (ADVIL,MOTRIN) 800 MG tablet Take 800 mg by mouth every 8 (eight) hours as needed for moderate pain.    Historical Provider, MD  insulin lispro (HUMALOG) 100 UNIT/ML injection Inject 1-11 Units into the skin 3 (three) times daily before meals. Sliding scale uses with humilin N .    Historical Provider, MD  insulin NPH Human (HUMULIN N,NOVOLIN N) 100 UNIT/ML injection  Inject 30 Units into the skin 2 (two) times daily as needed (>400 High Sugar).     Historical Provider, MD  mercaptopurine (PURINETHOL) 50 MG tablet Take 75 mg by mouth 2 (two) times daily. Give on an empty stomach 1 hour before or 2 hours after meals. Caution: Chemotherapy.    Historical Provider, MD  metFORMIN (GLUCOPHAGE) 1000 MG tablet Take 2,000 mg by mouth 2 (two) times daily with a meal.     Historical Provider, MD  Omega-3 Fatty Acids (FISH OIL PO) Take 1 capsule by mouth daily.     Historical Provider, MD  predniSONE (DELTASONE) 5 MG tablet Take 15 mg by mouth daily.     Historical Provider, MD  traMADol (ULTRAM) 50 MG tablet Take 1 tablet (50 mg total) by mouth every 6 (six) hours as needed. 01/21/15   Drema Pry, MD  traZODone (DESYREL) 100 MG tablet Take 150-200 mg by mouth at bedtime.     Historical Provider, MD  Venlafaxine HCl 225 MG TB24 Take 225 mg by mouth daily.    Historical Provider, MD   BP 103/74 mmHg  Pulse 97  Temp(Src) 97.9 F (36.6 C) (Oral)  Resp 16  SpO2 98%  LMP 04/27/2015   Physical Exam  Constitutional: She is oriented to person, place, and time. She appears well-developed and well-nourished. No distress.  Nontoxic/nonseptic appearing  HENT:  Head: Normocephalic and atraumatic.  Mouth/Throat: Oropharynx is clear and moist. No oropharyngeal exudate.  No angioedema  Eyes: Conjunctivae and EOM are normal. No scleral icterus.  Neck: Normal range of motion.  No stridor  Cardiovascular: Normal rate, regular rhythm and intact distal pulses.   Pulmonary/Chest: Effort normal and breath sounds normal. No respiratory distress. She has no wheezes. She has no rales.  Lungs CTAB. Chest expansion symmetric. No tachypnea or dyspnea.  Genitourinary:    There is no rash, tenderness, lesion or injury on the right labia. There is no rash, tenderness or injury on the left labia. Uterus is not tender. Cervix exhibits no motion tenderness and no friability. Right adnexum  displays no mass, no tenderness and no fullness. Left adnexum displays no mass, no tenderness and no fullness. No bleeding in the vagina. Vaginal discharge (pale yellow/off white creamy d/c) found.  Musculoskeletal: Normal range of motion.  No pitting edema in the BLE  Neurological: She is alert and oriented to person, place, and time. She exhibits normal muscle tone. Coordination normal.  GCS 15. Patient moving all extremities.  Skin: Skin is warm and dry. No rash noted. She is not diaphoretic. No erythema. No pallor.  Psychiatric: She  has a normal mood and affect. Her behavior is normal.  Nursing note and vitals reviewed.   ED Course  Procedures (including critical care time) DIAGNOSTIC STUDIES: Oxygen Saturation is 98% on RA, normal by my interpretation.    COORDINATION OF CARE: 3:41 AM-Discussed treatment plan with pt at bedside and pt agreed to plan.    Labs Review Labs Reviewed  CBC WITH DIFFERENTIAL/PLATELET - Abnormal; Notable for the following:    WBC 11.7 (*)    RBC 3.69 (*)    Hemoglobin 11.7 (*)    Platelets 483 (*)    Neutro Abs 8.0 (*)    All other components within normal limits  COMPREHENSIVE METABOLIC PANEL - Abnormal; Notable for the following:    Glucose, Bld 277 (*)    Alkaline Phosphatase 35 (*)    All other components within normal limits    Imaging Review Dg Chest 2 View  05/04/2015   CLINICAL DATA:  Shortness of breath, diabetes mellitus, hypertension, asthma, cirrhosis  EXAM: CHEST  2 VIEW  COMPARISON:  08/21/2012  FINDINGS: Normal heart size, mediastinal contours, and pulmonary vascularity.  Lungs clear.  No pneumothorax.  Bones unremarkable.  IMPRESSION: Normal exam.   Electronically Signed   By: Ulyses Southward M.D.   On: 05/04/2015 02:30   I have personally reviewed and evaluated these images and lab results as part of my medical decision-making.   EKG Interpretation   Date/Time:  Tuesday May 04 2015 01:27:58 EDT Ventricular Rate:  105 PR  Interval:  122 QRS Duration: 92 QT Interval:  338 QTC Calculation: 446 R Axis:   62 Text Interpretation:  Sinus tachycardia Right atrial enlargement T wave  abnormality, consider inferior ischemia T wave abnormality, consider  anterolateral ischemia Abnormal ECG artifacat noted Confirmed by Bebe Shaggy   MD, Dorinda Hill ([id number]) on 05/04/2015 2:49:26 AM      MDM   Final diagnoses:  Shortness of breath  Vaginal discharge    49 year old female presents to the emergency Department complaining of shortness of breath, onset yesterday morning. Patient states that her symptoms are present mostly in her neck. She denies inability to take a deep breath. No medications tried for symptoms. Today, patient is calm and without tachypnea or dyspnea. She has clear lung sounds b/l. No wheezing or stridor. No fever or hypoxia to suggest pneumonia. Chest x-ray and laboratory work up is noncontributory today. Patient ambulatory in the ED without hypoxia. O2 sats remain 98-100%. Also doubt PE. No concern for CHF-type picture; no clinical signs of fluid overload.  Patient to be discharged with instruction for outpatient PCP follow up. Patient given an inhaler to use PRN; she does have a hx of asthma. Return precautions given at discharge.  I personally performed the services described in this documentation, which was scribed in my presence. The recorded information has been reviewed and is accurate.   Filed Vitals:   05/04/15 0134 05/04/15 0330 05/04/15 0345 05/04/15 0400  BP: 103/74 104/67 101/71 119/75  Pulse: 97 81 80 88  Temp: 97.9 F (36.6 C)     TempSrc: Oral     Resp: 16     SpO2: 98% 98% 97% 99%    0445 - Patient now requesting STD check. She states that she had a "boil between my lips" on the left side x 1 week. She also reports a vaginal discharge. Will completed pelvic and add on STD panel and wet prep.  6045 - Pelvic completed. Wet prep pending.  0630 - Wet prep  without trichomonas or many WBCs.  Will d/c with instruction for PCP f/u. Return precautions given.  Antony Madura, PA-C 05/04/15 2032  Zadie Rhine, MD 05/04/15 [phone]

## 2015-11-25 ENCOUNTER — Emergency Department (HOSPITAL_COMMUNITY)
Admission: EM | Admit: 2015-11-25 | Discharge: 2015-11-25 | Disposition: A | Discharge status: Home / Self Care | Payer: Medicare Other | Attending: Emergency Medicine | Admitting: Emergency Medicine

## 2015-11-25 ENCOUNTER — Encounter (HOSPITAL_COMMUNITY): Payer: Self-pay

## 2015-11-25 DIAGNOSIS — Z794 Long term (current) use of insulin: Secondary | ICD-10-CM | POA: Diagnosis not present

## 2015-11-25 DIAGNOSIS — Z7952 Long term (current) use of systemic steroids: Secondary | ICD-10-CM | POA: Insufficient documentation

## 2015-11-25 DIAGNOSIS — E119 Type 2 diabetes mellitus without complications: Secondary | ICD-10-CM | POA: Diagnosis not present

## 2015-11-25 DIAGNOSIS — Z8619 Personal history of other infectious and parasitic diseases: Secondary | ICD-10-CM | POA: Insufficient documentation

## 2015-11-25 DIAGNOSIS — F419 Anxiety disorder, unspecified: Secondary | ICD-10-CM | POA: Insufficient documentation

## 2015-11-25 DIAGNOSIS — Z7984 Long term (current) use of oral hypoglycemic drugs: Secondary | ICD-10-CM | POA: Diagnosis not present

## 2015-11-25 DIAGNOSIS — Z8739 Personal history of other diseases of the musculoskeletal system and connective tissue: Secondary | ICD-10-CM | POA: Insufficient documentation

## 2015-11-25 DIAGNOSIS — F17210 Nicotine dependence, cigarettes, uncomplicated: Secondary | ICD-10-CM | POA: Diagnosis not present

## 2015-11-25 DIAGNOSIS — F319 Bipolar disorder, unspecified: Secondary | ICD-10-CM | POA: Insufficient documentation

## 2015-11-25 DIAGNOSIS — J0111 Acute recurrent frontal sinusitis: Secondary | ICD-10-CM | POA: Diagnosis not present

## 2015-11-25 DIAGNOSIS — E7800 Pure hypercholesterolemia, unspecified: Secondary | ICD-10-CM | POA: Insufficient documentation

## 2015-11-25 DIAGNOSIS — I10 Essential (primary) hypertension: Secondary | ICD-10-CM | POA: Diagnosis not present

## 2015-11-25 DIAGNOSIS — Z7951 Long term (current) use of inhaled steroids: Secondary | ICD-10-CM | POA: Insufficient documentation

## 2015-11-25 DIAGNOSIS — Z8669 Personal history of other diseases of the nervous system and sense organs: Secondary | ICD-10-CM | POA: Insufficient documentation

## 2015-11-25 DIAGNOSIS — R51 Headache: Secondary | ICD-10-CM | POA: Diagnosis present

## 2015-11-25 DIAGNOSIS — J45909 Unspecified asthma, uncomplicated: Secondary | ICD-10-CM | POA: Diagnosis not present

## 2015-11-25 DIAGNOSIS — Z79899 Other long term (current) drug therapy: Secondary | ICD-10-CM | POA: Diagnosis not present

## 2015-11-25 DIAGNOSIS — Z8719 Personal history of other diseases of the digestive system: Secondary | ICD-10-CM | POA: Insufficient documentation

## 2015-11-25 MED ORDER — AMOXICILLIN 500 MG PO CAPS: 500.0000 mg | ORAL_CAPSULE | Freq: Two times a day (BID) | ORAL | Status: DC

## 2015-11-25 NOTE — ED Notes (Signed)
Patient here with frontal headache x 1 week, she is afraid that she was exposed to meningitis. No neck pain, no distress

## 2015-11-25 NOTE — Discharge Instructions (Signed)
Please read and follow all provided instructions.  Your diagnoses today include:  1. Acute recurrent frontal sinusitis    Tests performed today include:  Vital signs. See below for your results today.   Medications:   Take any prescribed medications only as directed.  Additional information:  Follow any educational materials contained in this packet.  You are having a headache. No specific cause was found today for your headache. It may have been a migraine or other cause of headache. Stress, anxiety, fatigue, and depression are common triggers for headaches.   Your headache today does not appear to be life-threatening or require hospitalization, but often the exact cause of headaches is not determined in the emergency department. Therefore, follow-up with your doctor is very important to find out what may have caused your headache and whether or not you need any further diagnostic testing or treatment.   Sometimes headaches can appear benign (not harmful), but then more serious symptoms can develop which should prompt an immediate re-evaluation by your doctor or the emergency department.  BE VERY CAREFUL not to take multiple medicines containing Tylenol (also called acetaminophen). Doing so can lead to an overdose which can damage your liver and cause liver failure and possibly death.   Follow-up instructions: Please follow-up with your primary care provider in the next week for further evaluation of your symptoms.   Return instructions:   Please return to the Emergency Department if you experience worsening symptoms.  Return if the medications do not resolve your headache, if it recurs, or if you have multiple episodes of vomiting or cannot keep down fluids.  Return if you have a change from the usual headache.  RETURN IMMEDIATELY IF you:  Develop a sudden, severe headache  Develop confusion or become poorly responsive or faint  Develop a fever above 100.58F or problem  breathing  Have a change in speech, vision, swallowing, or understanding  Develop new weakness, numbness, tingling, incoordination in your arms or legs  Have a seizure  Please return if you have any other emergent concerns.  Additional Information:  Your vital signs today were: BP 112/77 mmHg   Pulse 77   Temp(Src) 98.7 F (37.1 C) (Oral)   Resp 16   SpO2 99% If your blood pressure (BP) was elevated above 135/85 this visit, please have this repeated by your doctor within one month. --------------

## 2015-11-25 NOTE — ED Provider Notes (Signed)
CSN: [id number]     Arrival date & time 11/25/15  1038 History   First MD Initiated Contact with Patient 11/25/15 1230     Chief Complaint  Patient presents with  . Headache   (Consider location/radiation/quality/duration/timing/severity/associated sxs/prior Treatment) HPI  50 y.o. female with a hx of DM, HLD, HTN, Cirrhosis, migraines, presents to the Emergency Department today complaining of headache x 1 week. States that the headache was gradual in onset. No inciting factors. Feels like a circumferential pressure around her head. Notes pain is 8/10. No fevers. No vision changes. No neck pain/stiffness. No CP/SOB/ABD pain. No N/V/D. Pt does endorse sinus congestion for the past week. Has tried OTC Ibuprofen for minimal relief of headache. No other symptoms noted.     Past Medical History  Diagnosis Date  . Asthma   . Diabetes mellitus   . Gout   . High cholesterol   . Hypertension   . Hepatitis B infection   . Anxiety   . Depression   . Bipolar 1 disorder (HCC)   . Liver cirrhosis (HCC)   . Neuropathy (HCC)   . FH: chemotherapy    Past Surgical History  Procedure Laterality Date  . Cesarean section    . Tubal ligation    . Laparoscopy    . Biopsy of liver     Family History  Problem Relation Age of Onset  . Other Neg Hx    Social History  Substance Use Topics  . Smoking status: Current Some Day Smoker -- 0.25 packs/day    Types: Cigarettes  . Smokeless tobacco: None  . Alcohol Use: No     Comment: ocassional   OB History    Gravida Para Term Preterm AB TAB SAB Ectopic Multiple Living   6 4 0 4 2 0 2  0 4     Review of Systems ROS reviewed and all are negative for acute change except as noted in the HPI.  Allergies  Tylenol  Home Medications   Prior to Admission medications   Medication Sig Start Date End Date Taking? Authorizing Provider  albuterol (PROVENTIL HFA;VENTOLIN HFA) 108 (90 BASE) MCG/ACT inhaler Inhale 2 puffs into the lungs 4 (four) times  daily as needed for wheezing or shortness of breath.    Yes Historical Provider, MD  dexlansoprazole (DEXILANT) 60 MG capsule Take 60 mg by mouth daily.   Yes Historical Provider, MD  diphenhydrAMINE (BENADRYL) 25 MG tablet Take 25 mg by mouth every 8 (eight) hours as needed for itching.    Yes Historical Provider, MD  furosemide (LASIX) 20 MG tablet Take 20 mg by mouth daily as needed for fluid.  01/27/14  Yes Historical Provider, MD  insulin lispro (HUMALOG) 100 UNIT/ML injection Inject 1-11 Units into the skin 3 (three) times daily before meals. Sliding scale uses with humilin N .   Yes Historical Provider, MD  insulin NPH Human (HUMULIN N,NOVOLIN N) 100 UNIT/ML injection Inject 30 Units into the skin 2 (two) times daily as needed (>400 High Sugar).    Yes Historical Provider, MD  mercaptopurine (PURINETHOL) 50 MG tablet Take 100 mg by mouth 2 (two) times daily. Give on an empty stomach 1 hour before or 2 hours after meals. Caution: Chemotherapy.   Yes Historical Provider, MD  metFORMIN (GLUCOPHAGE) 1000 MG tablet Take 2,000 mg by mouth 2 (two) times daily with a meal.    Yes Historical Provider, MD  Omega-3 Fatty Acids (FISH OIL PO) Take 1 capsule by mouth  daily.    Yes Historical Provider, MD  predniSONE (DELTASONE) 5 MG tablet Take 30 mg by mouth daily.    Yes Historical Provider, MD  traMADol (ULTRAM) 50 MG tablet Take 1 tablet (50 mg total) by mouth every 6 (six) hours as needed. 01/21/15  Yes Drema Pry, MD  traZODone (DESYREL) 100 MG tablet Take 150-200 mg by mouth at bedtime.    Yes Historical Provider, MD  fluticasone (FLONASE) 50 MCG/ACT nasal spray Place 2 sprays into both nostrils daily as needed for allergies or rhinitis.     Historical Provider, MD  ibuprofen (ADVIL,MOTRIN) 800 MG tablet Take 800 mg by mouth every 8 (eight) hours as needed for moderate pain.    Historical Provider, MD  Venlafaxine HCl 225 MG TB24 Take 225 mg by mouth daily.    Historical Provider, MD   BP 112/77 mmHg   Pulse 77  Temp(Src) 98.7 F (37.1 C) (Oral)  Resp 16  SpO2 99% Physical Exam  Constitutional: She is oriented to person, place, and time. She appears well-developed and well-nourished.  HENT:  Head: Normocephalic and atraumatic.  Nose: Rhinorrhea present. Right sinus exhibits maxillary sinus tenderness and frontal sinus tenderness. Left sinus exhibits maxillary sinus tenderness and frontal sinus tenderness.  Mouth/Throat: Uvula is midline, oropharynx is clear and moist and mucous membranes are normal. No posterior oropharyngeal edema, posterior oropharyngeal erythema or tonsillar abscesses.  Eyes: EOM are normal. Pupils are equal, round, and reactive to light.  Neck: Normal range of motion. Neck supple. No tracheal deviation present.  Cardiovascular: Normal rate, regular rhythm and normal heart sounds.   No murmur heard. Pulmonary/Chest: Effort normal and breath sounds normal. No respiratory distress. She has no wheezes. She has no rales. She exhibits no tenderness.  Abdominal: Soft. There is no tenderness.  Musculoskeletal: Normal range of motion.  Neurological: She is alert and oriented to person, place, and time. She has normal strength. No cranial nerve deficit or sensory deficit.  Neg Pronator Drift. Able to Ambulate. No Asterixis Cranial Nerves:  II: Pupils equal, round, reactive to light III,IV, VI: ptosis not present, extra-ocular motions intact bilaterally  V,VII: smile symmetric, facial light touch sensation equal VIII: hearing grossly normal bilaterally  IX,X: midline uvula rise  XI: bilateral shoulder shrug equal and strong XII: midline tongue extension  Skin: Skin is warm and dry.  Psychiatric: She has a normal mood and affect. Her behavior is normal. Thought content normal.  Nursing note and vitals reviewed.  ED Course  Procedures (including critical care time) Labs Review Labs Reviewed - No data to display  Imaging Review No results found. I have personally  reviewed and evaluated these images and lab results as part of my medical decision-making.   EKG Interpretation None      MDM  I have reviewed the relevant previous healthcare records. I obtained HPI from historian. Patient discussed with supervising physician  ED Course:  Assessment: Patient is a 49yF that presents with headache x 1 week. Patient is without high-risk features of headache including: Sudden onset/thunderclap HA, No similar headache in past, Altered mental status, Accompanying seizure, Headache with exertion, Age > 50, History of immunocompromise, Neck or shoulder pain, Fever, Use of anticoagulation, Family history of spontaneous SAH, Concomitant drug use, Toxic exposure.  Patient has a normal complete neurological exam, normal vital signs, normal level of consciousness, no signs of meningismus, is well-appearing/non-toxic appearing, no signs of trauma. No papilledema, no pain over the temporal arteries. Imaging with CT/MRI not indicated  given history and physical exam findings. No dangerous or life-threatening conditions suspected or identified by history, physical exam, and by work-up. No indications for hospitalization identified. Most likely related to sinus infection. Pt states similar symptoms in the past with infection and treated with antibiotics. At time of discharge, Patient is in no acute distress. Vital Signs are stable. Patient is able to ambulate. Patient able to tolerate PO.    Disposition/Plan:  DC Home Additional Verbal discharge instructions given and discussed with patient.  Pt Instructed to f/u with PCP in the next week for evaluation and treatment of symptoms. Return precautions given Pt acknowledges and agrees with plan  Supervising Physician Gwyneth Sprout, MD   Final diagnoses:  Acute recurrent frontal sinusitis       Audry Pili, PA-C 11/25/15 1358  Gwyneth Sprout, MD 11/25/15 2231

## 2016-01-18 ENCOUNTER — Emergency Department (HOSPITAL_COMMUNITY)
Admission: EM | Admit: 2016-01-18 | Discharge: 2016-01-18 | Disposition: A | Discharge status: Home / Self Care | Payer: Medicare Other | Attending: Emergency Medicine | Admitting: Emergency Medicine

## 2016-01-18 ENCOUNTER — Emergency Department (HOSPITAL_COMMUNITY): Payer: Medicare Other

## 2016-01-18 ENCOUNTER — Encounter (HOSPITAL_COMMUNITY): Payer: Self-pay | Admitting: Emergency Medicine

## 2016-01-18 DIAGNOSIS — J45909 Unspecified asthma, uncomplicated: Secondary | ICD-10-CM | POA: Diagnosis not present

## 2016-01-18 DIAGNOSIS — J0191 Acute recurrent sinusitis, unspecified: Secondary | ICD-10-CM | POA: Insufficient documentation

## 2016-01-18 DIAGNOSIS — F17210 Nicotine dependence, cigarettes, uncomplicated: Secondary | ICD-10-CM | POA: Diagnosis not present

## 2016-01-18 DIAGNOSIS — I10 Essential (primary) hypertension: Secondary | ICD-10-CM | POA: Diagnosis not present

## 2016-01-18 DIAGNOSIS — Z7984 Long term (current) use of oral hypoglycemic drugs: Secondary | ICD-10-CM | POA: Insufficient documentation

## 2016-01-18 DIAGNOSIS — E1165 Type 2 diabetes mellitus with hyperglycemia: Secondary | ICD-10-CM | POA: Diagnosis not present

## 2016-01-18 DIAGNOSIS — Z794 Long term (current) use of insulin: Secondary | ICD-10-CM | POA: Diagnosis not present

## 2016-01-18 DIAGNOSIS — E1121 Type 2 diabetes mellitus with diabetic nephropathy: Secondary | ICD-10-CM | POA: Insufficient documentation

## 2016-01-18 DIAGNOSIS — R739 Hyperglycemia, unspecified: Secondary | ICD-10-CM

## 2016-01-18 DIAGNOSIS — Z79899 Other long term (current) drug therapy: Secondary | ICD-10-CM | POA: Diagnosis not present

## 2016-01-18 DIAGNOSIS — J019 Acute sinusitis, unspecified: Secondary | ICD-10-CM

## 2016-01-18 DIAGNOSIS — Z791 Long term (current) use of non-steroidal anti-inflammatories (NSAID): Secondary | ICD-10-CM | POA: Diagnosis not present

## 2016-01-18 DIAGNOSIS — J029 Acute pharyngitis, unspecified: Secondary | ICD-10-CM | POA: Insufficient documentation

## 2016-01-18 DIAGNOSIS — R0602 Shortness of breath: Secondary | ICD-10-CM | POA: Diagnosis present

## 2016-01-18 DIAGNOSIS — Z792 Long term (current) use of antibiotics: Secondary | ICD-10-CM | POA: Insufficient documentation

## 2016-01-18 LAB — I-STAT BETA HCG BLOOD, ED (MC, WL, AP ONLY)

## 2016-01-18 LAB — CBC WITH DIFFERENTIAL/PLATELET
Basophils Absolute: 0 10*3/uL (ref 0.0–0.1)
Basophils Relative: 0 %
EOS PCT: 0 %
Eosinophils Absolute: 0 10*3/uL (ref 0.0–0.7)
HEMATOCRIT: 37.8 % (ref 36.0–46.0)
Hemoglobin: 12.8 g/dL (ref 12.0–15.0)
LYMPHS PCT: 20 %
Lymphs Abs: 2.4 10*3/uL (ref 0.7–4.0)
MCH: 33.4 pg (ref 26.0–34.0)
MCHC: 33.9 g/dL (ref 30.0–36.0)
MCV: 98.7 fL (ref 78.0–100.0)
MONO ABS: 0.5 10*3/uL (ref 0.1–1.0)
MONOS PCT: 4 %
NEUTROS ABS: 9.1 10*3/uL — AB (ref 1.7–7.7)
Neutrophils Relative %: 76 %
PLATELETS: 574 10*3/uL — AB (ref 150–400)
RBC: 3.83 MIL/uL — ABNORMAL LOW (ref 3.87–5.11)
RDW: 13.4 % (ref 11.5–15.5)
WBC: 12 10*3/uL — ABNORMAL HIGH (ref 4.0–10.5)

## 2016-01-18 LAB — COMPREHENSIVE METABOLIC PANEL
ALT: 21 U/L (ref 14–54)
ANION GAP: 11 (ref 5–15)
AST: 18 U/L (ref 15–41)
Albumin: 4.2 g/dL (ref 3.5–5.0)
Alkaline Phosphatase: 33 U/L — ABNORMAL LOW (ref 38–126)
BILIRUBIN TOTAL: 1.1 mg/dL (ref 0.3–1.2)
BUN: 16 mg/dL (ref 6–20)
CALCIUM: 9.8 mg/dL (ref 8.9–10.3)
CO2: 26 mmol/L (ref 22–32)
Chloride: 102 mmol/L (ref 101–111)
Creatinine, Ser: 0.64 mg/dL (ref 0.44–1.00)
Glucose, Bld: 328 mg/dL — ABNORMAL HIGH (ref 65–99)
POTASSIUM: 4 mmol/L (ref 3.5–5.1)
Sodium: 139 mmol/L (ref 135–145)
TOTAL PROTEIN: 7.9 g/dL (ref 6.5–8.1)

## 2016-01-18 LAB — URINE MICROSCOPIC-ADD ON

## 2016-01-18 LAB — URINALYSIS, ROUTINE W REFLEX MICROSCOPIC
Bilirubin Urine: NEGATIVE
HGB URINE DIPSTICK: NEGATIVE
Ketones, ur: NEGATIVE mg/dL
Leukocytes, UA: NEGATIVE
Nitrite: NEGATIVE
PROTEIN: NEGATIVE mg/dL
Specific Gravity, Urine: 1.04 — ABNORMAL HIGH (ref 1.005–1.030)
pH: 5.5 (ref 5.0–8.0)

## 2016-01-18 LAB — RAPID STREP SCREEN (MED CTR MEBANE ONLY): Streptococcus, Group A Screen (Direct): NEGATIVE

## 2016-01-18 MED ORDER — SODIUM CHLORIDE 0.9 % IV BOLUS (SEPSIS)
1000.0000 mL | Freq: Once | INTRAVENOUS | Status: AC
  Administered 2016-01-18: 1000 mL via INTRAVENOUS

## 2016-01-18 MED ORDER — AMOXICILLIN-POT CLAVULANATE 875-125 MG PO TABS: 1.0000 | ORAL_TABLET | Freq: Two times a day (BID) | ORAL | Status: DC

## 2016-01-18 MED ORDER — IPRATROPIUM BROMIDE 0.02 % IN SOLN
0.5000 mg | Freq: Once | RESPIRATORY_TRACT | Status: AC
  Administered 2016-01-18: 0.5 mg via RESPIRATORY_TRACT
  Filled 2016-01-18: qty 2.5

## 2016-01-18 MED ORDER — ALBUTEROL SULFATE (2.5 MG/3ML) 0.083% IN NEBU
5.0000 mg | INHALATION_SOLUTION | Freq: Once | RESPIRATORY_TRACT | Status: AC
  Administered 2016-01-18: 5 mg via RESPIRATORY_TRACT
  Filled 2016-01-18: qty 6

## 2016-01-18 NOTE — ED Notes (Signed)
MD at bedside. 

## 2016-01-18 NOTE — ED Provider Notes (Signed)
CSN: [id number]     Arrival date & time 01/18/16  [id number] History   First MD Initiated Contact with Patient 01/18/16 [phone]     Chief Complaint  Patient presents with  . Sore Throat  . Shortness of Breath  . Headache  . falling asleep      (Consider location/radiation/quality/duration/timing/severity/associated sxs/prior Treatment) HPI Comments: 50 year old female with history of diabetes, hypertension, high cholesterol, hepatitis B, liver cirrhosis presents for multiple complaints including increased use of her inhaler, shortness of breath, wheezing, intermittent headaches, sore throat. The patient states she has also been more fatigued than usual. She says that her husband who she is separated from did have spinal meningitis back in March. She said he was initially diagnosed with walking pneumonia but then it was found out that he had meningitis. Patient denies any neck pain. No fevers. She has reported urinary incontinence and frequency. She states she is not sure the last time she checked her blood sugar. She said her meter is broken at home. She denies any current headache. No nausea or vomiting. No weakness or sensory changes.  Patient is a 50 y.o. female presenting with pharyngitis, shortness of breath, and headaches.  Sore Throat Associated symptoms include headaches (no current headache per patient) and shortness of breath. Pertinent negatives include no chest pain and no abdominal pain.  Shortness of Breath Associated symptoms: headaches (no current headache per patient) and sore throat   Associated symptoms: no abdominal pain, no chest pain, no ear pain, no fever, no neck pain, no rash and no vomiting   Headache Associated symptoms: congestion, fatigue and sore throat   Associated symptoms: no abdominal pain, no back pain, no diarrhea, no drainage, no ear pain, no fever, no myalgias, no nausea, no neck pain, no neck stiffness, no numbness, no photophobia, no vomiting and no weakness      Past Medical History  Diagnosis Date  . Asthma   . Diabetes mellitus   . Gout   . High cholesterol   . Hypertension   . Hepatitis B infection   . Anxiety   . Depression   . Bipolar 1 disorder (HCC)   . Liver cirrhosis (HCC)   . Neuropathy (HCC)   . FH: chemotherapy    Past Surgical History  Procedure Laterality Date  . Cesarean section    . Tubal ligation    . Laparoscopy    . Biopsy of liver     Family History  Problem Relation Age of Onset  . Other Neg Hx    Social History  Substance Use Topics  . Smoking status: Current Some Day Smoker -- 0.25 packs/day    Types: Cigarettes  . Smokeless tobacco: None  . Alcohol Use: No     Comment: ocassional   OB History    Gravida Para Term Preterm AB TAB SAB Ectopic Multiple Living   6 4 0 4 2 0 2  0 4     Review of Systems  Constitutional: Positive for fatigue. Negative for fever and chills.  HENT: Positive for congestion and sore throat. Negative for ear pain, mouth sores, postnasal drip, trouble swallowing and voice change.   Eyes: Negative for photophobia and visual disturbance.  Respiratory: Positive for shortness of breath.   Cardiovascular: Negative for chest pain and palpitations.  Gastrointestinal: Negative for nausea, vomiting, abdominal pain and diarrhea.  Genitourinary: Positive for urgency and frequency. Negative for dysuria and decreased urine volume.  Musculoskeletal: Negative for myalgias, back pain, neck pain  and neck stiffness.  Skin: Negative for rash.  Neurological: Positive for headaches (no current headache per patient). Negative for weakness, light-headedness and numbness.  Hematological: Does not bruise/bleed easily.      Allergies  Tylenol  Home Medications   Prior to Admission medications   Medication Sig Start Date End Date Taking? Authorizing Provider  albuterol (PROVENTIL HFA;VENTOLIN HFA) 108 (90 BASE) MCG/ACT inhaler Inhale 2 puffs into the lungs 4 (four) times daily as needed  for wheezing or shortness of breath.    Yes Historical Provider, MD  dexlansoprazole (DEXILANT) 60 MG capsule Take 60 mg by mouth daily.   Yes Historical Provider, MD  diphenhydrAMINE (BENADRYL) 25 MG tablet Take 25 mg by mouth every 8 (eight) hours as needed for itching.    Yes Historical Provider, MD  fluticasone (FLONASE) 50 MCG/ACT nasal spray Place 2 sprays into both nostrils daily as needed for allergies or rhinitis.    Yes Historical Provider, MD  furosemide (LASIX) 20 MG tablet Take 20 mg by mouth daily as needed for fluid.  01/27/14  Yes Historical Provider, MD  ibuprofen (ADVIL,MOTRIN) 800 MG tablet Take 800 mg by mouth every 8 (eight) hours as needed for moderate pain.   Yes Historical Provider, MD  insulin lispro (HUMALOG) 100 UNIT/ML injection Inject 1-11 Units into the skin 3 (three) times daily before meals. Sliding scale uses with humilin N .   Yes Historical Provider, MD  insulin NPH Human (HUMULIN N,NOVOLIN N) 100 UNIT/ML injection Inject 30 Units into the skin 2 (two) times daily as needed (>400 High Sugar).    Yes Historical Provider, MD  mercaptopurine (PURINETHOL) 50 MG tablet Take 100 mg by mouth 2 (two) times daily. Give on an empty stomach 1 hour before or 2 hours after meals. Caution: Chemotherapy.   Yes Historical Provider, MD  metFORMIN (GLUCOPHAGE) 1000 MG tablet Take 2,000 mg by mouth 2 (two) times daily with a meal.    Yes Historical Provider, MD  Omega-3 Fatty Acids (FISH OIL PO) Take 1 capsule by mouth daily.    Yes Historical Provider, MD  predniSONE (DELTASONE) 5 MG tablet Take 20 mg by mouth daily.    Yes Historical Provider, MD  traZODone (DESYREL) 100 MG tablet Take 150-200 mg by mouth at bedtime.    Yes Historical Provider, MD  Venlafaxine HCl 225 MG TB24 Take 225 mg by mouth daily.   Yes Historical Provider, MD  amoxicillin (AMOXIL) 500 MG capsule Take 1 capsule (500 mg total) by mouth 2 (two) times daily. Patient not taking: Reported on 01/18/2016 11/25/15   Audry Pili, PA-C  amoxicillin-clavulanate (AUGMENTIN) 875-125 MG tablet Take 1 tablet by mouth every 12 (twelve) hours. 01/18/16   Leta Baptist, MD  traMADol (ULTRAM) 50 MG tablet Take 1 tablet (50 mg total) by mouth every 6 (six) hours as needed. Patient not taking: Reported on 01/18/2016 01/21/15   Drema Pry, MD   BP 119/86 mmHg  Pulse 92  Temp(Src) 98.3 F (36.8 C) (Oral)  Resp 18  Ht  (1.549 m)  Wt 212 lb (96.163 kg)  BMI 40.08 kg/m2  SpO2 100%  LMP 01/13/2016 Physical Exam  Constitutional: She is oriented to person, place, and time. She appears well-developed and well-nourished. No distress.  HENT:  Head: Normocephalic and atraumatic.  Right Ear: External ear normal.  Left Ear: External ear normal.  Nose: Nose normal.  Mouth/Throat: Oropharynx is clear and moist. No oropharyngeal exudate.  Eyes: EOM are normal. Pupils are  equal, round, and reactive to light.  Neck: Normal range of motion and full passive range of motion without pain. Neck supple. No muscular tenderness present. No rigidity. Normal range of motion present. No Brudzinski's sign and no Kernig's sign noted.  Cardiovascular: Normal rate, regular rhythm, normal heart sounds and intact distal pulses.   No murmur heard. Pulmonary/Chest: Effort normal. No respiratory distress. She has no wheezes. She has no rales.  Abdominal: Soft. She exhibits no distension. There is no tenderness.  Musculoskeletal: Normal range of motion. She exhibits no edema or tenderness.  Neurological: She is alert and oriented to person, place, and time. No cranial nerve deficit. She exhibits normal muscle tone.  Skin: Skin is warm and dry. No rash noted. She is not diaphoretic.  Vitals reviewed.   ED Course  Procedures (including critical care time) Labs Review Labs Reviewed  CBC WITH DIFFERENTIAL/PLATELET - Abnormal; Notable for the following:    WBC 12.0 (*)    RBC 3.83 (*)    Platelets 574 (*)    Neutro Abs 9.1 (*)    All other  components within normal limits  COMPREHENSIVE METABOLIC PANEL - Abnormal; Notable for the following:    Glucose, Bld 328 (*)    Alkaline Phosphatase 33 (*)    All other components within normal limits  URINALYSIS, ROUTINE W REFLEX MICROSCOPIC (NOT AT [facility]) - Abnormal; Notable for the following:    Specific Gravity, Urine 1.040 (*)    Glucose, UA >1000 (*)    All other components within normal limits  URINE MICROSCOPIC-ADD ON - Abnormal; Notable for the following:    Squamous Epithelial / LPF 0-5 (*)    Bacteria, UA RARE (*)    All other components within normal limits  RAPID STREP SCREEN (NOT AT [facility])  CULTURE, GROUP A STREP ([facility])  I-STAT BETA HCG BLOOD, ED ([facility], [facility], [facility] ONLY)    Imaging Review Dg Chest 2 View  01/18/2016  CLINICAL DATA:  Fever and chest pain. Shortness of breath. Wheezing. EXAM: CHEST  2 VIEW COMPARISON:  May 04, 2015 FINDINGS: Lungs are clear. Heart size and pulmonary vascularity are normal. No adenopathy. No pneumothorax. No bone lesions. IMPRESSION: No edema or consolidation. Electronically Signed   By: Bretta Bang III M.D.   On: 01/18/2016 09:37   I have personally reviewed and evaluated these images and lab results as part of my medical decision-making.   EKG Interpretation   Date/Time:  Tuesday January 18 2016 09:43:03 EDT Ventricular Rate:  73 PR Interval:  121 QRS Duration: 100 QT Interval:  386 QTC Calculation: 425 R Axis:   46 Text Interpretation:  Sinus rhythm Atrial premature complex Consider right  atrial enlargement Abnormal R-wave progression, early transition t wave  abnormality resolved since last tracing Confirmed by KNAPP  MD-J, JON  ([id number]) on 01/18/2016 9:57:28 AM      MDM  Patient seen and evaluated in stable condition. Patient with multiple complaints. Benign examination. No sign of meningitis and patient's husband who she is separated from had this back in March.  Laboratory studies with hyperglycemia without acidosis. Patient  also noted to have leukocytosis consistent with her being on steroids outpatient. She felt better after IV fluids and a breathing treatment. She was not given any further steroids that she is already taking prednisone outpatient did not radiate only taking about two thirds of the dose that she has been prescribed. All results were discussed at length at bedside with the patient expressed understanding. Care  management talk to her about help with obtaining a glucose monitor at home. She said that she would be able to follow up closely with her primary care physician. She was discharged home in stable condition with strict return precautions. Final diagnoses:  Acute sinusitis, recurrence not specified, unspecified location  Hyperglycemia    1.  Hyperglycemia 2. Sinusitis     Leta Baptist, MD 01/20/16 [phone]

## 2016-01-18 NOTE — Discharge Instructions (Signed)
You were seen and evaluated today for your multiple symptoms.  Likely many of the symptoms are related to your high blood sugar.  Please, follow up with your primary care physician for better management of your glucose.  Take the antibiotic prescribed for your sore throat and sinus pain which are likely related to a sinus infection.  Diabetes Mellitus and Food It is important for you to manage your blood sugar (glucose) level. Your blood glucose level can be greatly affected by what you eat. Eating healthier foods in the appropriate amounts throughout the day at about the same time each day will help you control your blood glucose level. It can also help slow or prevent worsening of your diabetes mellitus. Healthy eating may even help you improve the level of your blood pressure and reach or maintain a healthy weight.  General recommendations for healthful eating and cooking habits include:  Eating meals and snacks regularly. Avoid going long periods of time without eating to lose weight.  Eating a diet that consists mainly of plant-based foods, such as fruits, vegetables, nuts, legumes, and whole grains.  Using low-heat cooking methods, such as baking, instead of high-heat cooking methods, such as deep frying. Work with your dietitian to make sure you understand how to use the Nutrition Facts information on food labels. HOW CAN FOOD AFFECT ME? Carbohydrates Carbohydrates affect your blood glucose level more than any other type of food. Your dietitian will help you determine how many carbohydrates to eat at each meal and teach you how to count carbohydrates. Counting carbohydrates is important to keep your blood glucose at a healthy level, especially if you are using insulin or taking certain medicines for diabetes mellitus. Alcohol Alcohol can cause sudden decreases in blood glucose (hypoglycemia), especially if you use insulin or take certain medicines for diabetes mellitus. Hypoglycemia can be a  life-threatening condition. Symptoms of hypoglycemia (sleepiness, dizziness, and disorientation) are similar to symptoms of having too much alcohol.  If your health care provider has given you approval to drink alcohol, do so in moderation and use the following guidelines:  Women should not have more than one drink per day, and men should not have more than two drinks per day. One drink is equal to:  12 oz of beer.  5 oz of wine.  1 oz of hard liquor.  Do not drink on an empty stomach.  Keep yourself hydrated. Have water, diet soda, or unsweetened iced tea.  Regular soda, juice, and other mixers might contain a lot of carbohydrates and should be counted. WHAT FOODS ARE NOT RECOMMENDED? As you make food choices, it is important to remember that all foods are not the same. Some foods have fewer nutrients per serving than other foods, even though they might have the same number of calories or carbohydrates. It is difficult to get your body what it needs when you eat foods with fewer nutrients. Examples of foods that you should avoid that are high in calories and carbohydrates but low in nutrients include:  Trans fats (most processed foods list trans fats on the Nutrition Facts label).  Regular soda.  Juice.  Candy.  Sweets, such as cake, pie, doughnuts, and cookies.  Fried foods. WHAT FOODS CAN I EAT? Eat nutrient-rich foods, which will nourish your body and keep you healthy. The food you should eat also will depend on several factors, including:  The calories you need.  The medicines you take.  Your weight.  Your blood glucose level.  Your blood pressure level.  Your cholesterol level. You should eat a variety of foods, including:  Protein.  Lean cuts of meat.  Proteins low in saturated fats, such as fish, egg whites, and beans. Avoid processed meats.  Fruits and vegetables.  Fruits and vegetables that may help control blood glucose levels, such as apples,  mangoes, and yams.  Dairy products.  Choose fat-free or low-fat dairy products, such as milk, yogurt, and cheese.  Grains, bread, pasta, and rice.  Choose whole grain products, such as multigrain bread, whole oats, and brown rice. These foods may help control blood pressure.  Fats.  Foods containing healthful fats, such as nuts, avocado, olive oil, canola oil, and fish. DOES EVERYONE WITH DIABETES MELLITUS HAVE THE SAME MEAL PLAN? Because every person with diabetes mellitus is different, there is not one meal plan that works for everyone. It is very important that you meet with a dietitian who will help you create a meal plan that is just right for you.   This information is not intended to replace advice given to you by your health care provider. Make sure you discuss any questions you have with your health care provider.   Document Released: 04/27/2005 Document Revised: 08/21/2014 Document Reviewed: 06/27/2013 Elsevier Interactive Patient Education 2016 Elsevier Inc.  Hyperglycemia Hyperglycemia occurs when the glucose (sugar) in your blood is too high. Hyperglycemia can happen for many reasons, but it most often happens to people who do not know they have diabetes or are not managing their diabetes properly.  CAUSES  Whether you have diabetes or not, there are other causes of hyperglycemia. Hyperglycemia can occur when you have diabetes, but it can also occur in other situations that you might not be as aware of, such as: Diabetes  If you have diabetes and are having problems controlling your blood glucose, hyperglycemia could occur because of some of the following reasons:  Not following your meal plan.  Not taking your diabetes medications or not taking it properly.  Exercising less or doing less activity than you normally do.  Being sick. Pre-diabetes  This cannot be ignored. Before people develop Type 2 diabetes, they almost always have "pre-diabetes." This is when your  blood glucose levels are higher than normal, but not yet high enough to be diagnosed as diabetes. Research has shown that some long-term damage to the body, especially the heart and circulatory system, may already be occurring during pre-diabetes. If you take action to manage your blood glucose when you have pre-diabetes, you may delay or prevent Type 2 diabetes from developing. Stress  If you have diabetes, you may be "diet" controlled or on oral medications or insulin to control your diabetes. However, you may find that your blood glucose is higher than usual in the hospital whether you have diabetes or not. This is often referred to as "stress hyperglycemia." Stress can elevate your blood glucose. This happens because of hormones put out by the body during times of stress. If stress has been the cause of your high blood glucose, it can be followed regularly by your caregiver. That way he/she can make sure your hyperglycemia does not continue to get worse or progress to diabetes. Steroids  Steroids are medications that act on the infection fighting system (immune system) to block inflammation or infection. One side effect can be a rise in blood glucose. Most people can produce enough extra insulin to allow for this rise, but for those who cannot, steroids make blood glucose levels  go even higher. It is not unusual for steroid treatments to "uncover" diabetes that is developing. It is not always possible to determine if the hyperglycemia will go away after the steroids are stopped. A special blood test called an A1c is sometimes done to determine if your blood glucose was elevated before the steroids were started. SYMPTOMS  Thirsty.  Frequent urination.  Dry mouth.  Blurred vision.  Tired or fatigue.  Weakness.  Sleepy.  Tingling in feet or leg. DIAGNOSIS  Diagnosis is made by monitoring blood glucose in one or all of the following ways:  A1c test. This is a chemical found in your  blood.  Fingerstick blood glucose monitoring.  Laboratory results. TREATMENT  First, knowing the cause of the hyperglycemia is important before the hyperglycemia can be treated. Treatment may include, but is not be limited to:  Education.  Change or adjustment in medications.  Change or adjustment in meal plan.  Treatment for an illness, infection, etc.  More frequent blood glucose monitoring.  Change in exercise plan.  Decreasing or stopping steroids.  Lifestyle changes. HOME CARE INSTRUCTIONS   Test your blood glucose as directed.  Exercise regularly. Your caregiver will give you instructions about exercise. Pre-diabetes or diabetes which comes on with stress is helped by exercising.  Eat wholesome, balanced meals. Eat often and at regular, fixed times. Your caregiver or nutritionist will give you a meal plan to guide your sugar intake.  Being at an ideal weight is important. If needed, losing as little as 10 to 15 pounds may help improve blood glucose levels. SEEK MEDICAL CARE IF:   You have questions about medicine, activity, or diet.  You continue to have symptoms (problems such as increased thirst, urination, or weight gain). SEEK IMMEDIATE MEDICAL CARE IF:   You are vomiting or have diarrhea.  Your breath smells fruity.  You are breathing faster or slower.  You are very sleepy or incoherent.  You have numbness, tingling, or pain in your feet or hands.  You have chest pain.  Your symptoms get worse even though you have been following your caregiver's orders.  If you have any other questions or concerns.   This information is not intended to replace advice given to you by your health care provider. Make sure you discuss any questions you have with your health care provider.   Document Released: 01/24/2001 Document Revised: 10/23/2011 Document Reviewed: 04/06/2015 Elsevier Interactive Patient Education 2016 Elsevier Inc.  Sinusitis, Adult Sinusitis is  redness, soreness, and inflammation of the paranasal sinuses. Paranasal sinuses are air pockets within the bones of your face. They are located beneath your eyes, in the middle of your forehead, and above your eyes. In healthy paranasal sinuses, mucus is able to drain out, and air is able to circulate through them by way of your nose. However, when your paranasal sinuses are inflamed, mucus and air can become trapped. This can allow bacteria and other germs to grow and cause infection. Sinusitis can develop quickly and last only a short time (acute) or continue over a long period (chronic). Sinusitis that lasts for more than 12 weeks is considered chronic. CAUSES Causes of sinusitis include:  Allergies.  Structural abnormalities, such as displacement of the cartilage that separates your nostrils (deviated septum), which can decrease the air flow through your nose and sinuses and affect sinus drainage.  Functional abnormalities, such as when the small hairs (cilia) that line your sinuses and help remove mucus do not work properly or  are not present. SIGNS AND SYMPTOMS Symptoms of acute and chronic sinusitis are the same. The primary symptoms are pain and pressure around the affected sinuses. Other symptoms include:  Upper toothache.  Earache.  Headache.  Bad breath.  Decreased sense of smell and taste.  A cough, which worsens when you are lying flat.  Fatigue.  Fever.  Thick drainage from your nose, which often is green and may contain pus (purulent).  Swelling and warmth over the affected sinuses. DIAGNOSIS Your health care provider will perform a physical exam. During your exam, your health care provider may perform any of the following to help determine if you have acute sinusitis or chronic sinusitis:  Look in your nose for signs of abnormal growths in your nostrils (nasal polyps).  Tap over the affected sinus to check for signs of infection.  View the inside of your sinuses  using an imaging device that has a light attached (endoscope). If your health care provider suspects that you have chronic sinusitis, one or more of the following tests may be recommended:  Allergy tests.  Nasal culture. A sample of mucus is taken from your nose, sent to a lab, and screened for bacteria.  Nasal cytology. A sample of mucus is taken from your nose and examined by your health care provider to determine if your sinusitis is related to an allergy. TREATMENT Most cases of acute sinusitis are related to a viral infection and will resolve on their own within 10 days. Sometimes, medicines are prescribed to help relieve symptoms of both acute and chronic sinusitis. These may include pain medicines, decongestants, nasal steroid sprays, or saline sprays. However, for sinusitis related to a bacterial infection, your health care provider will prescribe antibiotic medicines. These are medicines that will help kill the bacteria causing the infection. Rarely, sinusitis is caused by a fungal infection. In these cases, your health care provider will prescribe antifungal medicine. For some cases of chronic sinusitis, surgery is needed. Generally, these are cases in which sinusitis recurs more than 3 times per year, despite other treatments. HOME CARE INSTRUCTIONS  Drink plenty of water. Water helps thin the mucus so your sinuses can drain more easily.  Use a humidifier.  Inhale steam 3-4 times a day (for example, sit in the bathroom with the shower running).  Apply a warm, moist washcloth to your face 3-4 times a day, or as directed by your health care provider.  Use saline nasal sprays to help moisten and clean your sinuses.  Take medicines only as directed by your health care provider.  If you were prescribed either an antibiotic or antifungal medicine, finish it all even if you start to feel better. SEEK IMMEDIATE MEDICAL CARE IF:  You have increasing pain or severe headaches.  You  have nausea, vomiting, or drowsiness.  You have swelling around your face.  You have vision problems.  You have a stiff neck.  You have difficulty breathing.   This information is not intended to replace advice given to you by your health care provider. Make sure you discuss any questions you have with your health care provider.   Document Released: 07/31/2005 Document Revised: 08/21/2014 Document Reviewed: 08/15/2011 Elsevier Interactive Patient Education [employer]

## 2016-01-18 NOTE — Progress Notes (Signed)
ED CM noted CM consult for Patient needs glucose monitor for home ED CM spoke with pt who states she has glucometer at home but had one she could get for free but needed prescriptions for test strips Pt states her pcp ([facility]) had called into her pharmacy but the pharmacy stated her coverage would not pay for it and pt wanted that particular one touch glucometer CM discussed with pt that [facility] does not have a program to provide glucometer Discussed that cost of test strips would be out of pocket expense Encouraged her to contact her pcp for assistance and trying another pharmacy for the particular glucometer she is seeking assist with.  Discussed discounted glucometer availability and provided literature of discounted glucometer Pt informed Cm she had a one touch, accu chek and a relion glucometer at home but no test strips  Pt during CM interaction with her noted to be in constant motion and talking fast Pt told Cm about her having to recently put her husband in a snf "It has been just too much going on" ED CM updated EDP Cyndie ChimeNguyen

## 2016-01-18 NOTE — ED Notes (Signed)
Called main lab to have them cancel the hCG - Had Dr. Cyndie ChimeNguyen change to iStat hCG.

## 2016-01-18 NOTE — ED Notes (Signed)
Patient c/o sore throat, headache, shortness of breath and having to use inhaler more, cough with brown phlegm.  States that she will "just fall asleep while I am talking and not good when i got my little grandkids around".  Patient states that she was exposed to her husband that had meningitis.  Patient states that she also having urinary incontinence.

## 2016-01-18 NOTE — ED Notes (Signed)
Patient transported to X-ray 

## 2016-01-19 LAB — CULTURE, GROUP A STREP (THRC)

## 2016-02-17 ENCOUNTER — Emergency Department (HOSPITAL_COMMUNITY)
Admission: EM | Admit: 2016-02-17 | Discharge: 2016-02-17 | Disposition: A | Discharge status: Home / Self Care | Payer: Medicare Other | Attending: Emergency Medicine | Admitting: Emergency Medicine

## 2016-02-17 ENCOUNTER — Encounter (HOSPITAL_COMMUNITY): Payer: Self-pay

## 2016-02-17 DIAGNOSIS — Z7984 Long term (current) use of oral hypoglycemic drugs: Secondary | ICD-10-CM | POA: Diagnosis not present

## 2016-02-17 DIAGNOSIS — J029 Acute pharyngitis, unspecified: Secondary | ICD-10-CM | POA: Diagnosis not present

## 2016-02-17 DIAGNOSIS — Z79899 Other long term (current) drug therapy: Secondary | ICD-10-CM | POA: Diagnosis not present

## 2016-02-17 DIAGNOSIS — H6692 Otitis media, unspecified, left ear: Secondary | ICD-10-CM | POA: Diagnosis not present

## 2016-02-17 DIAGNOSIS — F17210 Nicotine dependence, cigarettes, uncomplicated: Secondary | ICD-10-CM | POA: Diagnosis not present

## 2016-02-17 DIAGNOSIS — J0100 Acute maxillary sinusitis, unspecified: Secondary | ICD-10-CM | POA: Diagnosis not present

## 2016-02-17 DIAGNOSIS — J45909 Unspecified asthma, uncomplicated: Secondary | ICD-10-CM | POA: Diagnosis not present

## 2016-02-17 DIAGNOSIS — E1140 Type 2 diabetes mellitus with diabetic neuropathy, unspecified: Secondary | ICD-10-CM | POA: Insufficient documentation

## 2016-02-17 DIAGNOSIS — I10 Essential (primary) hypertension: Secondary | ICD-10-CM | POA: Insufficient documentation

## 2016-02-17 DIAGNOSIS — Z794 Long term (current) use of insulin: Secondary | ICD-10-CM | POA: Diagnosis not present

## 2016-02-17 LAB — CBC WITH DIFFERENTIAL/PLATELET
BASOS ABS: 0 10*3/uL (ref 0.0–0.1)
BASOS PCT: 0 %
Eosinophils Absolute: 0.1 10*3/uL (ref 0.0–0.7)
Eosinophils Relative: 1 %
HEMATOCRIT: 36.3 % (ref 36.0–46.0)
HEMOGLOBIN: 12 g/dL (ref 12.0–15.0)
Lymphocytes Relative: 23 %
Lymphs Abs: 2.4 10*3/uL (ref 0.7–4.0)
MCH: 32.9 pg (ref 26.0–34.0)
MCHC: 33.1 g/dL (ref 30.0–36.0)
MCV: 99.5 fL (ref 78.0–100.0)
Monocytes Absolute: 0.6 10*3/uL (ref 0.1–1.0)
Monocytes Relative: 6 %
NEUTROS ABS: 7.3 10*3/uL (ref 1.7–7.7)
NEUTROS PCT: 70 %
Platelets: 453 10*3/uL — ABNORMAL HIGH (ref 150–400)
RBC: 3.65 MIL/uL — ABNORMAL LOW (ref 3.87–5.11)
RDW: 14 % (ref 11.5–15.5)
WBC: 10.4 10*3/uL (ref 4.0–10.5)

## 2016-02-17 LAB — CBG MONITORING, ED
Glucose-Capillary: 335 mg/dL — ABNORMAL HIGH (ref 65–99)
Glucose-Capillary: 221 mg/dL — ABNORMAL HIGH (ref 65–99)

## 2016-02-17 LAB — COMPREHENSIVE METABOLIC PANEL
ALBUMIN: 3.2 g/dL — AB (ref 3.5–5.0)
ALK PHOS: 32 U/L — AB (ref 38–126)
ALT: 15 U/L (ref 14–54)
AST: 13 U/L — AB (ref 15–41)
Anion gap: 5 (ref 5–15)
BILIRUBIN TOTAL: 0.3 mg/dL (ref 0.3–1.2)
BUN: 9 mg/dL (ref 6–20)
CO2: 27 mmol/L (ref 22–32)
Calcium: 9.3 mg/dL (ref 8.9–10.3)
Chloride: 104 mmol/L (ref 101–111)
Creatinine, Ser: 0.52 mg/dL (ref 0.44–1.00)
GFR calc Af Amer: >60 mL/min (ref >60)
GFR calc non Af Amer: >60 mL/min (ref >60)
GLUCOSE: 329 mg/dL — AB (ref 65–99)
POTASSIUM: 3.7 mmol/L (ref 3.5–5.1)
Sodium: 136 mmol/L (ref 135–145)
TOTAL PROTEIN: 6.9 g/dL (ref 6.5–8.1)

## 2016-02-17 LAB — LIPASE, BLOOD: Lipase: 38 U/L (ref 11–51)

## 2016-02-17 MED ORDER — FLUCONAZOLE 200 MG PO TABS: 200.0000 mg | ORAL_TABLET | Freq: Every day | ORAL | Status: AC

## 2016-02-17 MED ORDER — AZITHROMYCIN 250 MG PO TABS: ORAL_TABLET | ORAL | Status: DC

## 2016-02-17 MED ORDER — SODIUM CHLORIDE 0.9 % IV BOLUS (SEPSIS)
1000.0000 mL | Freq: Once | INTRAVENOUS | Status: AC
  Administered 2016-02-17: 1000 mL via INTRAVENOUS

## 2016-02-17 NOTE — ED Notes (Signed)
Patient CBG was 335, the Nurse was informed.

## 2016-02-17 NOTE — ED Notes (Signed)
Pt presents with multiple symptoms x 2 weeks.  Pt reports 1 week h/o tongue swelling and difficulty swallowing.  Pt reports [facility], received abx which gave her a yeast infection.  Pt requesting diflucan.  Pt reports her tongue began to blister after taking abx.  Pt also reports headaches, CBG over 500 yesterday.  Pt reports stumping L big toe x 2 weeks ago.

## 2016-02-17 NOTE — Discharge Instructions (Signed)
Your glucose was slightly elevated. Otherwise test were good. Prescription for antibiotic and antifungal medication. Follow-up your primary care doctor.  Phone number for podiatrist given for your toe

## 2016-02-17 NOTE — ED Provider Notes (Signed)
CSN: [id number]     Arrival date & time 02/17/16  0714 History   First MD Initiated Contact with Patient 02/17/16 [phone]     No chief complaint on file.    (Consider location/radiation/quality/duration/timing/severity/associated sxs/prior Treatment) HPI.Marland Kitchen.Marland Kitchen.Marland Kitchen.Patient complains of sore throat, left ear pain, tongue pain, vaginal yeast infection, discomfort in left great toe. She was seen at [facility] on June 6 and diagnosed with sinusitis and hyperglycemia. She has a primary care doctor but has not been seeing him. No stiff neck, fever, sweats, chills, neurodeficits, chest pain, dyspnea, dysuria. Severity of symptoms mild.  Past Medical History  Diagnosis Date  . Asthma   . Diabetes mellitus   . Gout   . High cholesterol   . Hypertension   . Hepatitis B infection   . Anxiety   . Depression   . Bipolar 1 disorder (HCC)   . Liver cirrhosis (HCC)   . Neuropathy (HCC)   . FH: chemotherapy    Past Surgical History  Procedure Laterality Date  . Cesarean section    . Tubal ligation    . Laparoscopy    . Biopsy of liver     Family History  Problem Relation Age of Onset  . Other Neg Hx    Social History  Substance Use Topics  . Smoking status: Current Some Day Smoker -- 0.25 packs/day    Types: Cigarettes  . Smokeless tobacco: None  . Alcohol Use: No     Comment: ocassional   OB History    Gravida Para Term Preterm AB TAB SAB Ectopic Multiple Living   6 4 0 4 2 0 2  0 4     Review of Systems  All other systems reviewed and are negative.     Allergies  Tylenol and Augmentin  Home Medications   Prior to Admission medications   Medication Sig Start Date End Date Taking? Authorizing Provider  albuterol (PROVENTIL HFA;VENTOLIN HFA) 108 (90 BASE) MCG/ACT inhaler Inhale 2 puffs into the lungs 4 (four) times daily as needed for wheezing or shortness of breath.    Yes Historical Provider, MD  dexlansoprazole (DEXILANT) 60 MG capsule Take 60 mg by mouth daily.   Yes  Historical Provider, MD  diphenhydrAMINE (BENADRYL) 25 MG tablet Take 25 mg by mouth every 8 (eight) hours as needed for itching.    Yes Historical Provider, MD  fluticasone (FLONASE) 50 MCG/ACT nasal spray Place 2 sprays into both nostrils daily as needed for allergies or rhinitis.    Yes Historical Provider, MD  furosemide (LASIX) 20 MG tablet Take 20 mg by mouth daily as needed for fluid.  01/27/14  Yes Historical Provider, MD  ibuprofen (ADVIL,MOTRIN) 800 MG tablet Take 800 mg by mouth every 8 (eight) hours as needed for moderate pain.   Yes Historical Provider, MD  insulin lispro (HUMALOG) 100 UNIT/ML injection Inject 1-11 Units into the skin 3 (three) times daily before meals. Sliding scale uses with humilin N .   Yes Historical Provider, MD  insulin NPH Human (HUMULIN N,NOVOLIN N) 100 UNIT/ML injection Inject 30 Units into the skin 2 (two) times daily as needed (>400 High Sugar).    Yes Historical Provider, MD  mercaptopurine (PURINETHOL) 50 MG tablet Take 100 mg by mouth 2 (two) times daily. Give on an empty stomach 1 hour before or 2 hours after meals. Caution: Chemotherapy.   Yes Historical Provider, MD  metFORMIN (GLUCOPHAGE) 1000 MG tablet Take 2,000 mg by mouth 2 (two) times daily  with a meal.    Yes Historical Provider, MD  Omega-3 Fatty Acids (FISH OIL PO) Take 1 capsule by mouth daily.    Yes Historical Provider, MD  predniSONE (DELTASONE) 5 MG tablet Take 15 mg by mouth daily.    Yes Historical Provider, MD  traZODone (DESYREL) 100 MG tablet Take 150-200 mg by mouth at bedtime as needed for sleep.    Yes Historical Provider, MD  azithromycin (ZITHROMAX) 250 MG tablet As directed 02/17/16   Donnetta HutchingBrian Ginna Schuur, MD  fluconazole (DIFLUCAN) 200 MG tablet Take 1 tablet (200 mg total) by mouth daily. 02/17/16 02/24/16  Donnetta HutchingBrian Savannah Morford, MD  traMADol (ULTRAM) 50 MG tablet Take 1 tablet (50 mg total) by mouth every 6 (six) hours as needed. Patient not taking: Reported on 01/18/2016 01/21/15   Nira ConnPedro Eduardo Cardama,  MD   BP 134/80 mmHg  Pulse 87  Temp(Src) 97.9 F (36.6 C) (Oral)  Resp 18  Ht 5\' 1"  (1.549 m)  Wt 208 lb 6.4 oz (94.53 kg)  BMI 39.40 kg/m2  SpO2 100%  LMP 02/17/2016 (Exact Date) Physical Exam  Constitutional: She is oriented to person, place, and time. She appears well-developed and well-nourished.  HENT:  Head: Normocephalic and atraumatic.  Slight oral pharyngeal erythema.  Tender over maxillary sinuses.  Eyes: Conjunctivae and EOM are normal. Pupils are equal, round, and reactive to light.  Neck: Normal range of motion. Neck supple.  Cardiovascular: Normal rate and regular rhythm.   Pulmonary/Chest: Effort normal and breath sounds normal.  Abdominal: Soft. Bowel sounds are normal.  Musculoskeletal: Normal range of motion.  Neurological: She is alert and oriented to person, place, and time.  Skin:  Left great toe:  Evidence of onychomycosis  Psychiatric: She has a normal mood and affect. Her behavior is normal.  Nursing note and vitals reviewed.   ED Course  Procedures (including critical care time) Labs Review Labs Reviewed  CBC WITH DIFFERENTIAL/PLATELET - Abnormal; Notable for the following:    RBC 3.65 (*)    Platelets 453 (*)    All other components within normal limits  COMPREHENSIVE METABOLIC PANEL - Abnormal; Notable for the following:    Glucose, Bld 329 (*)    Albumin 3.2 (*)    AST 13 (*)    Alkaline Phosphatase 32 (*)    All other components within normal limits  CBG MONITORING, ED - Abnormal; Notable for the following:    Glucose-Capillary 335 (*)    All other components within normal limits  CBG MONITORING, ED - Abnormal; Notable for the following:    Glucose-Capillary 221 (*)    All other components within normal limits  LIPASE, BLOOD    Imaging Review No results found. I have personally reviewed and evaluated these images and lab results as part of my medical decision-making.   EKG Interpretation None      MDM   Final diagnoses:   Acute maxillary sinusitis, recurrence not specified  Acute left otitis media, recurrence not specified, unspecified otitis media type  Pharyngitis    Patient is nontoxic-appearing. Rx Zithromax for pharyngitis and left otitis media. Referral to podiatrist. Diflucan for yeast infection.  Patient encouraged to return to her primary care doctor    Donnetta HutchingBrian Baylie Drakes, MD 02/17/16 [phone]

## 2016-06-13 ENCOUNTER — Emergency Department (HOSPITAL_COMMUNITY)
Admission: EM | Admit: 2016-06-13 | Discharge: 2016-06-14 | Disposition: A | Discharge status: Home / Self Care | Payer: Medicare Other | Attending: Emergency Medicine | Admitting: Emergency Medicine

## 2016-06-13 ENCOUNTER — Encounter (HOSPITAL_COMMUNITY): Payer: Self-pay

## 2016-06-13 ENCOUNTER — Emergency Department (HOSPITAL_COMMUNITY): Payer: Medicare Other

## 2016-06-13 DIAGNOSIS — I10 Essential (primary) hypertension: Secondary | ICD-10-CM | POA: Insufficient documentation

## 2016-06-13 DIAGNOSIS — J449 Chronic obstructive pulmonary disease, unspecified: Secondary | ICD-10-CM | POA: Insufficient documentation

## 2016-06-13 DIAGNOSIS — J45901 Unspecified asthma with (acute) exacerbation: Secondary | ICD-10-CM | POA: Diagnosis not present

## 2016-06-13 DIAGNOSIS — R0602 Shortness of breath: Secondary | ICD-10-CM | POA: Diagnosis present

## 2016-06-13 DIAGNOSIS — J069 Acute upper respiratory infection, unspecified: Secondary | ICD-10-CM | POA: Insufficient documentation

## 2016-06-13 DIAGNOSIS — Z7984 Long term (current) use of oral hypoglycemic drugs: Secondary | ICD-10-CM | POA: Insufficient documentation

## 2016-06-13 DIAGNOSIS — Z794 Long term (current) use of insulin: Secondary | ICD-10-CM | POA: Diagnosis not present

## 2016-06-13 DIAGNOSIS — F17210 Nicotine dependence, cigarettes, uncomplicated: Secondary | ICD-10-CM | POA: Insufficient documentation

## 2016-06-13 DIAGNOSIS — Z79899 Other long term (current) drug therapy: Secondary | ICD-10-CM | POA: Insufficient documentation

## 2016-06-13 DIAGNOSIS — E1140 Type 2 diabetes mellitus with diabetic neuropathy, unspecified: Secondary | ICD-10-CM | POA: Insufficient documentation

## 2016-06-13 DIAGNOSIS — R911 Solitary pulmonary nodule: Secondary | ICD-10-CM | POA: Diagnosis not present

## 2016-06-13 LAB — URINALYSIS, ROUTINE W REFLEX MICROSCOPIC
Bilirubin Urine: NEGATIVE
Glucose, UA: >1000 mg/dL — AB
Hgb urine dipstick: NEGATIVE
KETONES UR: NEGATIVE mg/dL
LEUKOCYTES UA: NEGATIVE
NITRITE: NEGATIVE
PROTEIN: NEGATIVE mg/dL
Specific Gravity, Urine: 1.04 — ABNORMAL HIGH (ref 1.005–1.030)
pH: 5.5 (ref 5.0–8.0)

## 2016-06-13 LAB — BASIC METABOLIC PANEL
Anion gap: 9 (ref 5–15)
BUN: 13 mg/dL (ref 6–20)
CALCIUM: 9.2 mg/dL (ref 8.9–10.3)
CHLORIDE: 102 mmol/L (ref 101–111)
CO2: 25 mmol/L (ref 22–32)
CREATININE: 0.55 mg/dL (ref 0.44–1.00)
Glucose, Bld: 239 mg/dL — ABNORMAL HIGH (ref 65–99)
Potassium: 4.1 mmol/L (ref 3.5–5.1)
SODIUM: 136 mmol/L (ref 135–145)

## 2016-06-13 LAB — HEPATIC FUNCTION PANEL
ALK PHOS: 34 U/L — AB (ref 38–126)
ALT: 17 U/L (ref 14–54)
AST: 21 U/L (ref 15–41)
Albumin: 3.5 g/dL (ref 3.5–5.0)
BILIRUBIN INDIRECT: 0.5 mg/dL (ref 0.3–0.9)
BILIRUBIN TOTAL: 0.6 mg/dL (ref 0.3–1.2)
Bilirubin, Direct: 0.1 mg/dL (ref 0.1–0.5)
TOTAL PROTEIN: 6.8 g/dL (ref 6.5–8.1)

## 2016-06-13 LAB — CBC
HCT: 37.6 % (ref 36.0–46.0)
Hemoglobin: 12.7 g/dL (ref 12.0–15.0)
MCH: 32.3 pg (ref 26.0–34.0)
MCHC: 33.8 g/dL (ref 30.0–36.0)
MCV: 95.7 fL (ref 78.0–100.0)
PLATELETS: 444 10*3/uL — AB (ref 150–400)
RBC: 3.93 MIL/uL (ref 3.87–5.11)
RDW: 16.1 % — AB (ref 11.5–15.5)
WBC: 12.2 10*3/uL — AB (ref 4.0–10.5)

## 2016-06-13 LAB — URINE MICROSCOPIC-ADD ON: RBC / HPF: NONE SEEN RBC/hpf (ref 0–5)

## 2016-06-13 LAB — PREGNANCY, URINE: Preg Test, Ur: NEGATIVE

## 2016-06-13 LAB — LIPASE, BLOOD: LIPASE: 30 U/L (ref 11–51)

## 2016-06-13 LAB — CBG MONITORING, ED: GLUCOSE-CAPILLARY: 254 mg/dL — AB (ref 65–99)

## 2016-06-13 MED ORDER — ALBUTEROL SULFATE (2.5 MG/3ML) 0.083% IN NEBU
5.0000 mg | INHALATION_SOLUTION | Freq: Once | RESPIRATORY_TRACT | Status: AC
  Administered 2016-06-13: 5 mg via RESPIRATORY_TRACT
  Filled 2016-06-13 (×2): qty 6

## 2016-06-13 MED ORDER — SODIUM CHLORIDE 0.9 % IV BOLUS (SEPSIS)
1000.0000 mL | Freq: Once | INTRAVENOUS | Status: AC
  Administered 2016-06-13: 1000 mL via INTRAVENOUS

## 2016-06-13 MED ORDER — IPRATROPIUM BROMIDE 0.02 % IN SOLN
0.5000 mg | Freq: Once | RESPIRATORY_TRACT | Status: AC
  Administered 2016-06-13: 0.5 mg via RESPIRATORY_TRACT
  Filled 2016-06-13: qty 2.5

## 2016-06-13 MED ORDER — IOPAMIDOL (ISOVUE-300) INJECTION 61%
INTRAVENOUS | Status: AC
  Filled 2016-06-13: qty 75

## 2016-06-13 NOTE — ED Triage Notes (Addendum)
Pt reports headache, shortness of breath, chest pain, and generalized body aches intermittently for several weeks. Pt reports she was recently told she has high blood pressure. No distress noted in triage, lung sounds clear.
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
## 2016-06-14 ENCOUNTER — Emergency Department (HOSPITAL_COMMUNITY): Payer: Medicare Other

## 2016-06-14 ENCOUNTER — Encounter (HOSPITAL_COMMUNITY): Payer: Self-pay | Admitting: Radiology

## 2016-06-14 DIAGNOSIS — J45901 Unspecified asthma with (acute) exacerbation: Secondary | ICD-10-CM | POA: Diagnosis not present

## 2016-06-14 LAB — RAPID STREP SCREEN (MED CTR MEBANE ONLY): Streptococcus, Group A Screen (Direct): NEGATIVE

## 2016-06-14 MED ORDER — IOPAMIDOL (ISOVUE-300) INJECTION 61%
75.0000 mL | Freq: Once | INTRAVENOUS | Status: AC | PRN
  Administered 2016-06-14: 75 mL via INTRAVENOUS

## 2016-06-14 MED ORDER — GI COCKTAIL ~~LOC~~
30.0000 mL | Freq: Once | ORAL | Status: AC
  Administered 2016-06-14: 30 mL via ORAL
  Filled 2016-06-14: qty 30

## 2016-06-14 NOTE — Discharge Instructions (Signed)
Continues take all of your medications. Please follow-up with her primary care doctor concerning the pulmonary nodules found on your chest CT. He may continue symptomatically treatment for your runny nose, sore throat. Please return to the ED if your symptoms worsen including chest pain, worsening shortness of breath, fevers or for any other reason. Please continued use her albuterol inhaler as needed.

## 2016-06-14 NOTE — ED Notes (Signed)
Signature pod not working, pt verbalized understanding of discharge instructions.

## 2016-06-16 LAB — CULTURE, GROUP A STREP (THRC)

## 2016-07-11 ENCOUNTER — Emergency Department (HOSPITAL_COMMUNITY)
Admission: EM | Admit: 2016-07-11 | Discharge: 2016-07-11 | Disposition: A | Discharge status: Home / Self Care | Payer: Medicare Other | Attending: Emergency Medicine | Admitting: Emergency Medicine

## 2016-07-11 ENCOUNTER — Encounter (HOSPITAL_COMMUNITY): Payer: Self-pay

## 2016-07-11 ENCOUNTER — Emergency Department (HOSPITAL_COMMUNITY): Payer: Medicare Other

## 2016-07-11 DIAGNOSIS — R05 Cough: Secondary | ICD-10-CM | POA: Diagnosis present

## 2016-07-11 DIAGNOSIS — Z794 Long term (current) use of insulin: Secondary | ICD-10-CM | POA: Insufficient documentation

## 2016-07-11 DIAGNOSIS — I10 Essential (primary) hypertension: Secondary | ICD-10-CM | POA: Insufficient documentation

## 2016-07-11 DIAGNOSIS — F17210 Nicotine dependence, cigarettes, uncomplicated: Secondary | ICD-10-CM | POA: Insufficient documentation

## 2016-07-11 DIAGNOSIS — E1140 Type 2 diabetes mellitus with diabetic neuropathy, unspecified: Secondary | ICD-10-CM | POA: Diagnosis not present

## 2016-07-11 DIAGNOSIS — J40 Bronchitis, not specified as acute or chronic: Secondary | ICD-10-CM | POA: Diagnosis not present

## 2016-07-11 LAB — CBC
HEMATOCRIT: 38.1 % (ref 36.0–46.0)
HEMOGLOBIN: 13.1 g/dL (ref 12.0–15.0)
MCH: 32.5 pg (ref 26.0–34.0)
MCHC: 34.4 g/dL (ref 30.0–36.0)
MCV: 94.5 fL (ref 78.0–100.0)
Platelets: 437 10*3/uL — ABNORMAL HIGH (ref 150–400)
RBC: 4.03 MIL/uL (ref 3.87–5.11)
RDW: 15.6 % — ABNORMAL HIGH (ref 11.5–15.5)
WBC: 14.1 10*3/uL — ABNORMAL HIGH (ref 4.0–10.5)

## 2016-07-11 LAB — BASIC METABOLIC PANEL
ANION GAP: 9 (ref 5–15)
BUN: 14 mg/dL (ref 6–20)
CALCIUM: 9.7 mg/dL (ref 8.9–10.3)
CO2: 26 mmol/L (ref 22–32)
Chloride: 100 mmol/L — ABNORMAL LOW (ref 101–111)
Creatinine, Ser: 0.83 mg/dL (ref 0.44–1.00)
GLUCOSE: 374 mg/dL — AB (ref 65–99)
POTASSIUM: 4.1 mmol/L (ref 3.5–5.1)
Sodium: 135 mmol/L (ref 135–145)

## 2016-07-11 LAB — TROPONIN I: Troponin I: <0.03 ng/mL (ref <0.03)

## 2016-07-11 LAB — I-STAT TROPONIN, ED: TROPONIN I, POC: 0 ng/mL (ref 0.00–0.08)

## 2016-07-11 MED ORDER — AZITHROMYCIN 250 MG PO TABS: 250.0000 mg | ORAL_TABLET | Freq: Every day | ORAL | 0 refills | Status: DC

## 2016-07-11 MED ORDER — PREDNISONE 20 MG PO TABS
60.0000 mg | ORAL_TABLET | Freq: Once | ORAL | Status: AC
  Administered 2016-07-11: 60 mg via ORAL
  Filled 2016-07-11: qty 3

## 2016-07-11 MED ORDER — AZITHROMYCIN 250 MG PO TABS
500.0000 mg | ORAL_TABLET | Freq: Once | ORAL | Status: AC
  Administered 2016-07-11: 500 mg via ORAL
  Filled 2016-07-11: qty 2

## 2016-07-11 MED ORDER — KETOROLAC TROMETHAMINE 60 MG/2ML IM SOLN
60.0000 mg | Freq: Once | INTRAMUSCULAR | Status: AC
  Administered 2016-07-11: 60 mg via INTRAMUSCULAR
  Filled 2016-07-11: qty 2

## 2016-07-11 MED ORDER — BENZONATATE 100 MG PO CAPS: 100.0000 mg | ORAL_CAPSULE | Freq: Three times a day (TID) | ORAL | 0 refills | Status: DC

## 2016-07-11 MED ORDER — IPRATROPIUM-ALBUTEROL 0.5-2.5 (3) MG/3ML IN SOLN
3.0000 mL | Freq: Once | RESPIRATORY_TRACT | Status: AC
  Administered 2016-07-11: 3 mL via RESPIRATORY_TRACT
  Filled 2016-07-11: qty 3

## 2016-07-11 MED ORDER — PREDNISONE 20 MG PO TABS: ORAL_TABLET | ORAL | 0 refills | Status: DC

## 2016-07-11 NOTE — ED Triage Notes (Signed)
Patient complains of chest pain and shortness of breath x 1 week. States that her pain is worse with inspiration. Also complains of right eye pain, states that she feels as if something in her eye
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
## 2016-07-11 NOTE — ED Notes (Signed)
Waiting for troponin to result and edp to talk with patient, patient updated

## 2016-07-11 NOTE — ED Notes (Signed)
ED Provider at bedside. 

## 2016-07-11 NOTE — ED Notes (Signed)
RN called patient x1 

## 2016-07-21 ENCOUNTER — Emergency Department (HOSPITAL_COMMUNITY)
Admission: EM | Admit: 2016-07-21 | Discharge: 2016-07-21 | Disposition: A | Discharge status: Home / Self Care | Payer: Medicare Other | Attending: Emergency Medicine | Admitting: Emergency Medicine

## 2016-07-21 ENCOUNTER — Encounter (HOSPITAL_COMMUNITY): Payer: Self-pay

## 2016-07-21 DIAGNOSIS — E1140 Type 2 diabetes mellitus with diabetic neuropathy, unspecified: Secondary | ICD-10-CM | POA: Insufficient documentation

## 2016-07-21 DIAGNOSIS — F17210 Nicotine dependence, cigarettes, uncomplicated: Secondary | ICD-10-CM | POA: Diagnosis not present

## 2016-07-21 DIAGNOSIS — Z794 Long term (current) use of insulin: Secondary | ICD-10-CM | POA: Diagnosis not present

## 2016-07-21 DIAGNOSIS — J45909 Unspecified asthma, uncomplicated: Secondary | ICD-10-CM | POA: Diagnosis not present

## 2016-07-21 DIAGNOSIS — I10 Essential (primary) hypertension: Secondary | ICD-10-CM | POA: Diagnosis not present

## 2016-07-21 DIAGNOSIS — H5713 Ocular pain, bilateral: Secondary | ICD-10-CM | POA: Diagnosis present

## 2016-07-21 DIAGNOSIS — J329 Chronic sinusitis, unspecified: Secondary | ICD-10-CM | POA: Diagnosis not present

## 2016-07-21 MED ORDER — SULFAMETHOXAZOLE-TRIMETHOPRIM 800-160 MG PO TABS: 1.0000 | ORAL_TABLET | Freq: Two times a day (BID) | ORAL | 0 refills | Status: AC

## 2016-07-21 NOTE — ED Provider Notes (Signed)
  Face-to-face evaluation   History: She complains of intermittent vision problems as well as a pressure sensation behind her eyes for several days. This is a recurrent problem. She denies nasal drainage, sneezing or coughing. She has not had any fever or chills.  Physical exam: Calm, cooperative. Bilateral conjunctival injection without drainage. Mild tenderness with percussion over the frontal and maxillary sinuses bilaterally. The patient is nontoxic in appearance. There is no meningismus.  Medical screening examination/treatment/procedure(s) were conducted as a shared visit with non-physician practitioner(s) and myself.  I personally evaluated the patient during the encounter    Mancel BaleElliott Lavene Penagos, MD 07/22/16 1304
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
## 2016-07-21 NOTE — ED Triage Notes (Signed)
Pt. Reports that she is having bilateral eye pain with redness and itchy for over 2 weeks ago.  In the past week she has developed a headache.  She also has bumps on her that she is popping.  She has a lump on her neck and her back.    Skin is warm and dry.   Nasal congestion .   Alert and oriented X4.

## 2016-07-21 NOTE — Discharge Instructions (Signed)
Use Flonase twice a day for 2 weeks, and Afrin nasal spray, twice a day for 2 weeks. Both 1 spray in each nostril, each time.  Get plenty of rest, and drink a lot of fluids.

## 2016-08-17 ENCOUNTER — Telehealth: Payer: Self-pay | Admitting: Internal Medicine

## 2016-08-17 NOTE — Telephone Encounter (Signed)
Spoke with patient regarding referral to primary care doctor. [facility] she has poorly controlled DM and liver cancer- needs to be seen ASAP.

## 2016-09-01 ENCOUNTER — Ambulatory Visit: Payer: Medicare Other

## 2016-09-25 ENCOUNTER — Ambulatory Visit: Payer: Medicare Other

## 2016-10-18 ENCOUNTER — Ambulatory Visit: Payer: Medicare Other

## 2016-10-27 ENCOUNTER — Ambulatory Visit (INDEPENDENT_AMBULATORY_CARE_PROVIDER_SITE_OTHER): Payer: Medicare Other | Admitting: Internal Medicine

## 2016-10-27 DIAGNOSIS — F17210 Nicotine dependence, cigarettes, uncomplicated: Secondary | ICD-10-CM | POA: Diagnosis not present

## 2016-10-27 DIAGNOSIS — M79672 Pain in left foot: Secondary | ICD-10-CM | POA: Diagnosis not present

## 2016-10-27 MED ORDER — DICLOFENAC SODIUM 1 % TD GEL: 4.0000 g | Freq: Four times a day (QID) | TRANSDERMAL | 2 refills | Status: DC

## 2016-10-27 NOTE — Progress Notes (Signed)
   CC: Establish care  HPI:  Hannah Young is a 51 y.o. F with past medical history outlined below here to establish care and with acute complaint of left foot pain. Patient reports she has been having pain at the lateral aspect of her left foot with weightbearing. She denies any recent injury or trauma. She says she mostly wears house slippers during the day and is not overly active. She does not exercise or walk regularly. Patient is in a hurry today and does not wish to discuss her other medical problems. She is requesting to make anther appointment to address her other issues.   For the details of today's visit please refer to the assessment and plan.  Past Medical History:  Diagnosis Date  . Anxiety   . Asthma   . Bipolar 1 disorder (HCC)   . Depression   . Diabetes mellitus   . FH: chemotherapy   . Gout   . Hepatitis B infection   . High cholesterol   . Hypertension   . Liver cirrhosis (HCC)   . Neuropathy (HCC)     Review of Systems:  All pertinents listed in HPI, otherwise negative  Physical Exam:  Vitals:   10/27/16 1551  BP: 121/67  Pulse: 86  Temp: 98.3 F (36.8 C)  TempSrc: Oral  SpO2: 100%  Weight: 198 lb 4.8 oz (89.9 kg)  Height: 5\' 1"  (1.549 m)    Constitutional: NAD, appears comfortable Cardiovascular: RRR Pulmonary/Chest: CTAB Extremities: Left foot with pinpoint tenderness to palpation over the base of her 5th metatarsal. Pain is reproducible while distracting patient with conversation. No obvious deformities or superficial bruising. No swelling. Pulses intact.  Neurological: A&Ox3, CN II - XII grossly intact.   Assessment & Plan:   See Encounters Tab for problem based charting.  Patient discussed with Dr. Oswaldo DoneVincent

## 2016-10-27 NOTE — Patient Instructions (Signed)
[name],  It was a pleasure to see you today. Please go upstairs to the first floor radiology department for your xray. I have prescribed a topical pain medication called voltaren gel that you can apply to the affected area up to 4 times daily as needed. I have sent this prescription to your pharmacy. I will call you with the results of your xray. Please return to clinic at your earliest convenience to discuss your other health problems. If you have any questions or concerns, call our clinic at [phone] or after hours call [phone] and ask for the internal medicine resident on call. Thanks!

## 2016-10-28 NOTE — Assessment & Plan Note (Addendum)
Reproducible, pinpoint tenderness over the base of her 5th metatarsal. There is no deformity on exam, swelling, or bruising. Patient reports pain over the area with weight bearing activity. She mostly wears house slippers at home and is not overly active. The location of her pain is concerning for a stress fracture, however she does not exercise or walk regularly. Poor bone density could be a potential risk factor given her age. Will evaluate further with plain film. Pain management with Voltaren gel. Instructed patient to make an appointment at her earliest convenience for follow up and to address her other medical issues.  -- Xray left foot  -- Voltaren gel prn for pain  -- Follow up 1 week

## 2016-10-31 NOTE — Progress Notes (Signed)
Internal Medicine Clinic Attending  Case discussed with Dr. Guilloud at the time of the visit.  We reviewed the resident's history and exam and pertinent patient test results.  I agree with the assessment, diagnosis, and plan of care documented in the resident's note.  

## 2016-12-19 ENCOUNTER — Telehealth: Payer: Self-pay

## 2016-12-19 DIAGNOSIS — M79672 Pain in left foot: Secondary | ICD-10-CM

## 2016-12-20 MED ORDER — DICLOFENAC SODIUM 1 % TD GEL: 4.0000 g | Freq: Four times a day (QID) | TRANSDERMAL | 2 refills | Status: DC

## 2017-01-01 ENCOUNTER — Telehealth: Payer: Self-pay

## 2017-01-01 NOTE — Telephone Encounter (Signed)
Per Tyler AasDoris will be reassigned to one of the incoming residents.  Can patient wait until July for an appt?

## 2017-01-01 NOTE — Telephone Encounter (Signed)
She is establishing care and has DM and HTN and complicated history without a recent A1C.  It would be better for her to be seen in May or June to review medications, etc.  [facility] If she is not to be assigned until the new interns get here, but it would be better for her to be seen sooner.

## 2017-01-04 NOTE — Telephone Encounter (Signed)
appt 6/13

## 2017-01-16 ENCOUNTER — Telehealth: Payer: Self-pay | Admitting: *Deleted

## 2017-01-16 ENCOUNTER — Ambulatory Visit (HOSPITAL_COMMUNITY)
Admission: RE | Admit: 2017-01-16 | Discharge: 2017-01-16 | Disposition: A | Discharge status: Home / Self Care | Payer: Medicare Other | Source: Ambulatory Visit | Attending: Internal Medicine | Admitting: Internal Medicine

## 2017-01-16 ENCOUNTER — Other Ambulatory Visit: Payer: Self-pay

## 2017-01-16 DIAGNOSIS — M7732 Calcaneal spur, left foot: Secondary | ICD-10-CM | POA: Insufficient documentation

## 2017-01-16 DIAGNOSIS — M79672 Pain in left foot: Secondary | ICD-10-CM | POA: Diagnosis not present

## 2017-01-16 NOTE — Telephone Encounter (Signed)
Pt has an appt on 6/13.

## 2017-01-16 NOTE — Telephone Encounter (Signed)
traMADol (ULTRAM) 50 MG tablet,  predniSONE (DELTASONE) 5 MG tablet, refill request [facility].

## 2017-01-16 NOTE — Telephone Encounter (Signed)
Pt has an appt on 6/13. Also she wants to know result of x-rays. Thanks

## 2017-01-17 NOTE — Telephone Encounter (Signed)
Her x-ray does not show any acute abnormality, we can discuss that results during her appointment.

## 2017-01-24 ENCOUNTER — Ambulatory Visit (INDEPENDENT_AMBULATORY_CARE_PROVIDER_SITE_OTHER): Payer: Medicare Other | Admitting: Internal Medicine

## 2017-01-24 VITALS — BP 114/80 | HR 78 | Temp 98.2°F | Wt 216.7 lb

## 2017-01-24 DIAGNOSIS — R918 Other nonspecific abnormal finding of lung field: Secondary | ICD-10-CM | POA: Diagnosis not present

## 2017-01-24 DIAGNOSIS — M79672 Pain in left foot: Secondary | ICD-10-CM

## 2017-01-24 DIAGNOSIS — M25561 Pain in right knee: Secondary | ICD-10-CM

## 2017-01-24 DIAGNOSIS — K754 Autoimmune hepatitis: Secondary | ICD-10-CM

## 2017-01-24 DIAGNOSIS — G8929 Other chronic pain: Secondary | ICD-10-CM

## 2017-01-24 DIAGNOSIS — Z87891 Personal history of nicotine dependence: Secondary | ICD-10-CM

## 2017-01-24 DIAGNOSIS — E118 Type 2 diabetes mellitus with unspecified complications: Secondary | ICD-10-CM

## 2017-01-24 DIAGNOSIS — F329 Major depressive disorder, single episode, unspecified: Secondary | ICD-10-CM

## 2017-01-24 DIAGNOSIS — J45909 Unspecified asthma, uncomplicated: Secondary | ICD-10-CM | POA: Diagnosis not present

## 2017-01-24 DIAGNOSIS — I10 Essential (primary) hypertension: Secondary | ICD-10-CM | POA: Diagnosis not present

## 2017-01-24 DIAGNOSIS — N898 Other specified noninflammatory disorders of vagina: Secondary | ICD-10-CM | POA: Diagnosis not present

## 2017-01-24 DIAGNOSIS — Z794 Long term (current) use of insulin: Secondary | ICD-10-CM

## 2017-01-24 DIAGNOSIS — Z79899 Other long term (current) drug therapy: Secondary | ICD-10-CM

## 2017-01-24 DIAGNOSIS — M25562 Pain in left knee: Secondary | ICD-10-CM | POA: Diagnosis not present

## 2017-01-24 DIAGNOSIS — J4520 Mild intermittent asthma, uncomplicated: Secondary | ICD-10-CM

## 2017-01-24 DIAGNOSIS — E119 Type 2 diabetes mellitus without complications: Secondary | ICD-10-CM

## 2017-01-24 DIAGNOSIS — F32A Depression, unspecified: Secondary | ICD-10-CM

## 2017-01-24 LAB — GLUCOSE, CAPILLARY: Glucose-Capillary: 146 mg/dL — ABNORMAL HIGH (ref 65–99)

## 2017-01-24 LAB — POCT GLYCOSYLATED HEMOGLOBIN (HGB A1C): HEMOGLOBIN A1C: 6.4

## 2017-01-24 MED ORDER — METFORMIN HCL 1000 MG PO TABS: 1000.0000 mg | ORAL_TABLET | Freq: Two times a day (BID) | ORAL | 2 refills | Status: DC

## 2017-01-24 MED ORDER — CELECOXIB 200 MG PO CAPS: 200.0000 mg | ORAL_CAPSULE | Freq: Two times a day (BID) | ORAL | 2 refills | Status: DC

## 2017-01-24 MED ORDER — ALBUTEROL SULFATE HFA 108 (90 BASE) MCG/ACT IN AERS: 2.0000 | INHALATION_SPRAY | RESPIRATORY_TRACT | 2 refills | Status: DC | PRN

## 2017-01-24 MED ORDER — DEXLANSOPRAZOLE 60 MG PO CPDR: 60.0000 mg | DELAYED_RELEASE_CAPSULE | Freq: Every day | ORAL | 2 refills | Status: DC

## 2017-01-24 MED ORDER — LISINOPRIL 20 MG PO TABS: 20.0000 mg | ORAL_TABLET | Freq: Every day | ORAL | 2 refills | Status: DC

## 2017-01-24 MED ORDER — DIPHENHYDRAMINE HCL 25 MG PO TABS: 25.0000 mg | ORAL_TABLET | Freq: Three times a day (TID) | ORAL | 2 refills | Status: DC | PRN

## 2017-01-24 MED ORDER — GABAPENTIN 100 MG PO CAPS: 100.0000 mg | ORAL_CAPSULE | Freq: Three times a day (TID) | ORAL | 0 refills | Status: DC

## 2017-01-24 MED ORDER — FLUCONAZOLE 150 MG PO TABS: 150.0000 mg | ORAL_TABLET | Freq: Once | ORAL | 0 refills | Status: DC | PRN

## 2017-01-24 NOTE — Progress Notes (Signed)
CC: To establish care with multiple complaints.  HPI:  [name] is a 51 y.o. with past medical history as listed below came to the clinic to establish care, as patient wants to switch her PCP. She had multiple complaints.  Frequent headache. She was complaining of frequent headaches, mostly occipital, no aura, no photo sensitivity, no associated nausea or vomiting, no change in vision, seems to get worse with any stresser. She takes daily ibuprofen, it works most of the time but occasionally failed to eliminate her headache completely. Most likely she is having tension headaches, drug overuse headaches can be a possibility because of her daily use of ibuprofen.   Bilateral knee pain. She is complaining of bilateral knee pain, stating that she used to follow-up with orthopedic and getting knee injections regularly, they told her that she needs a knee replacement. According to patient she has more pain in her left knee as compared to right, according to her 6/ 10 on the right knee and 8/ 10 on left. She is taking ibuprofen 800 mg 2-3 times daily but it was causing stomach ache recently. Her pain get worse with walking, no obvious deformity and ROM was non restricted.  Foot pain. She was seen in clinic in March with complaint of left foot pain, was diagnosed with musculoskeletal and was given a prescription of Volteran gel and ibuprofen. She states that it does not help much and now she started getting pain in her right foot. She describes pain as burning, occasionally she is unable to walk on her feet because of pain, her pain does wake her up at night. On exam she is having some swelling and tenderness on right medial malleolus. She was asking for a referral to foot doctor-referral for podiatry was provided.  Depression. She was also complaining of worsening depression since the death of her husband in 19-Aug-2016. According to patient she used to take Effexor and follow up at  [facility] She has not seen her psychiatrist for more than a year. She does has a diagnosis of bipolar, not on any mood stabilizer currently. She needs to see her psychiatrist at [facility], as SSRI or SNRI can precipitate mania in patients with bipolar disorder.  Chronic hepatitis/liver cirrhosis. According to patient she has history of hepatitis B and liver cirrhosis. According to her medication this she is on mercaptopurine and prednisone for her liver cirrhosis. On the record I can found is a negative hep B antigen testing in 2011 and a liver biopsy in 2011 which shows chronic active hepatitis with fibrosis. She was asking for refill of her mercaptopurine and prednisone. According to her she was also following up with [facility] gastroenterology, I cannot found any records. Her last follow-up was 2 year ago. According to her her PCP was giving her prescription for mercaptopurine and prednisone for her liver disease. -I will not give her prescription for these 2 medications. -We will obtain records. -Provided her with a referral to [facility] gastroenterology.  Vaginal discharge. According to patient she is getting this whitish discharge along with some itching, she do get frequent candidal infections because of her chronic use of prednisone. Her PCP was giving her Diflucan and her symptoms normally resolved with 1 pill. These symptoms are similar to her early candidal vaginitis. She was asking for Diflucan. I gave her prescription for Diflucan.  Lung nodules. She was found to have multiple lung nodules on his CT chest and in November 2017. She denies any cough. She  was a former smoker, quit 7 years ago, used to smoke 1/4 pack per day. She was very concerned about her lung nodules. CT scan done in November 2017 recommended repeat CT in 3-6 month. -We will repeat CT chest.  Hypertension. Her blood pressure is well maintained on lisinopril. She is compliant with her medication.  Diabetes. She was on metformin  2000 mg twice daily and NovoLog according to sliding scale. Her A1c today was 6.4. I decreased her metformin to 1000 mg twice daily and advised her to continue NovoLog according to sliding scale as she was doing before.  Past Medical History:  Diagnosis Date  . Anxiety   . Asthma   . Bipolar 1 disorder (HCC)   . Depression   . Diabetes mellitus   . FH: chemotherapy   . Gout   . Hepatitis B infection   . High cholesterol   . Hypertension   . Liver cirrhosis (HCC)   . Neuropathy (HCC)     Review of Systems:  As per HPI.  Physical Exam:  Vitals:   01/24/17 1502  BP: 114/80  Pulse: 78  Temp: 98.2 F (36.8 C)  TempSrc: Oral  SpO2: 100%  Weight: 216 lb 11.2 oz (98.3 kg)   Vitals:   01/24/17 1502  BP: 114/80  Pulse: 78  Temp: 98.2 F (36.8 C)  TempSrc: Oral  SpO2: 100%  Weight: 216 lb 11.2 oz (98.3 kg)   General: Vital signs reviewed.  Patient is well-developed and Obese lady, in no acute distress and cooperative with exam.  Head: Normocephalic and atraumatic. Eyes: EOMI, conjunctivae normal, no scleral icterus.  Neck: Supple, trachea midline, normal ROM, no JVD, masses, thyromegaly, or carotid bruit present.  Cardiovascular: RRR, S1 normal, S2 normal, no murmurs, gallops, or rubs. Pulmonary/Chest: Clear to auscultation bilaterally, no wheezes, rales, or rhonchi. Abdominal: Soft, non-tender, non-distended, BS +, no masses, organomegaly, or guarding present.  Musculoskeletal: Mild edema and tenderness of right medial malleolus, no erythema or hyperthermia ,No other joint deformities, erythema, or stiffness, ROM full and nontender. Extremities: No lower extremity edema bilaterally,  pulses symmetric and intact bilaterally. No cyanosis or clubbing. Neurological: A&O x3, Strength is normal and symmetric bilaterally, cranial nerve II-XII are grossly intact, no focal motor deficit, sensory intact to light touch bilaterally.    Assessment & Plan:   See Encounters Tab for  problem based charting.  Patient discussed with Dr. Heide SparkNarendra.

## 2017-01-24 NOTE — Patient Instructions (Addendum)
Thank you for visiting clinic today. I am giving you referral for GI, Orthopedic, foot doctor. [facility] and bipolar disorder. Your blood pressure and diabetes is under good control. Please follow-up in 3 months.

## 2017-01-25 ENCOUNTER — Telehealth: Payer: Self-pay

## 2017-01-25 ENCOUNTER — Encounter: Payer: Self-pay | Admitting: Internal Medicine

## 2017-01-25 DIAGNOSIS — N898 Other specified noninflammatory disorders of vagina: Secondary | ICD-10-CM | POA: Insufficient documentation

## 2017-01-25 DIAGNOSIS — F32A Depression, unspecified: Secondary | ICD-10-CM | POA: Insufficient documentation

## 2017-01-25 DIAGNOSIS — F329 Major depressive disorder, single episode, unspecified: Secondary | ICD-10-CM | POA: Insufficient documentation

## 2017-01-25 DIAGNOSIS — M25562 Pain in left knee: Secondary | ICD-10-CM

## 2017-01-25 DIAGNOSIS — M25561 Pain in right knee: Secondary | ICD-10-CM

## 2017-01-25 DIAGNOSIS — G8929 Other chronic pain: Secondary | ICD-10-CM | POA: Insufficient documentation

## 2017-01-25 DIAGNOSIS — R918 Other nonspecific abnormal finding of lung field: Secondary | ICD-10-CM | POA: Insufficient documentation

## 2017-01-25 DIAGNOSIS — F331 Major depressive disorder, recurrent, moderate: Secondary | ICD-10-CM | POA: Insufficient documentation

## 2017-01-25 HISTORY — DX: Other specified noninflammatory disorders of vagina: N89.8

## 2017-01-25 LAB — MICROALBUMIN / CREATININE URINE RATIO
CREATININE, UR: 54.5 mg/dL
Microalb/Creat Ratio: <5.5 mg/g creat (ref 0.0–30.0)
Microalbumin, Urine: <3 ug/mL

## 2017-01-25 NOTE — Assessment & Plan Note (Signed)
She was seen in clinic in March with complaint of left foot pain, was diagnosed with musculoskeletal and was given a prescription of Volteran gel and ibuprofen. She states that it does not help much and now she started getting pain in her right foot. She describes pain as burning, occasionally she is unable to walk on her feet because of pain, her pain does wake her up at night.  On exam she is having some swelling and tenderness on right medial malleolus. Her left foot x-ray was positive for only a small calcaneal spur.  Her pain looks more like neuropathic in origin. -I started her on gabapentin 100 mg 3 times a day, we will titrate her dose accordingly.  She was asking for a referral to foot doctor-referral for podiatry was provided.

## 2017-01-25 NOTE — Assessment & Plan Note (Addendum)
According to patient she has history of hepatitis B and liver cirrhosis. According to her medication this she is on mercaptopurine and prednisone for her liver cirrhosis. On the record I can found is a negative hep B antigen testing in 2011 and a liver biopsy in 2011 which shows chronic active hepatitis with fibrosis. In the problem list we found autoimmune hepatitis. I could not find any documentation supporting that diagnosis. She had mildly elevated liver enzymes in 2011 to 2014. She was also having elevated IgG a an IgG, positive A 1 antitrypsin and negative ANA. In 2011.  She was asking for refill of her mercaptopurine and prednisone. According to her she was also following [facility], I cannot found any records. Her last follow-up was 2 year ago. According to her her PCP was giving her prescription for mercaptopurine and prednisone for her liver disease.  -I am not comfortable with giving her prescription for these 2 medications. -We will obtain records. -Provided her with a referral to [facility].

## 2017-01-25 NOTE — Assessment & Plan Note (Signed)
No acute exacerbation currently.  Continue albuterol when necessary.

## 2017-01-25 NOTE — Assessment & Plan Note (Signed)
She was also complaining of worsening depression since the death of her husband in December 2017. According to patient she used to take Effexor and follow up [facility]. She has not seen her psychiatrist for more than a year. According to problem list ,She does has a diagnosis of bipolar, not on any mood stabilizer currently.  She needs to [facility], as SSRI or SNRI can precipitate mania in patients with bipolar disorder.

## 2017-01-25 NOTE — Telephone Encounter (Signed)
Requesting lab results. Please call back.  

## 2017-01-25 NOTE — Assessment & Plan Note (Signed)
BP Readings from Last 3 Encounters:  01/24/17 114/80  10/27/16 121/67  07/21/16 117/79   She has well-controlled hypertension on lisinopril.  She is compliant with her medication.  Refill was provided for lisinopril 20 mg daily.

## 2017-01-25 NOTE — Assessment & Plan Note (Signed)
According to patient she is getting this whitish discharge along with some itching, she do get frequent candidal infections because of her chronic use of prednisone. Her PCP was giving her Diflucan and her symptoms normally resolved with 1 pill. These symptoms are similar to her early candidal vaginitis. She was asking for Diflucan. I gave her prescription for Diflucan.

## 2017-01-25 NOTE — Assessment & Plan Note (Signed)
Current A1c today was 6.4.  She was on metformin 2000 mg twice daily and NovoLog according to sliding scale.  She has well-controlled diabetes, she was using more than maximum dose of metformin.  I decreased her metformin to 1000 mg twice daily and advised her to continue NovoLog according to sliding scale as she was doing before. -Refill was provided.

## 2017-01-25 NOTE — Assessment & Plan Note (Signed)
She is complaining of bilateral knee pain, stating that she used to follow-up with orthopedic and getting knee injections regularly, they told her that she needs a knee replacement. According to patient she has more pain in her left knee as compared to right, according to her 6/ 10 on the right knee and 8/ 10 on left. She is taking ibuprofen 800 mg 2-3 times daily but it was causing stomach ache recently. Her pain get worse with walking, no obvious deformity and ROM was non restricted.  -I advised her to use Voltaren gel and knee brace while walking. -Provided her with prescription for Celebrex, as ibuprofen was causing some gastric irritation. -Also provided her with a referral to orthopedic surgery.

## 2017-01-25 NOTE — Assessment & Plan Note (Signed)
She was found to have multiple lung nodules on his CT chest and in November 2017. She denies any cough. She was a former smoker, quit 7 years ago, used to smoke 1/4 pack per day. She was very concerned about her lung nodules. CT scan done in November 2017 recommended repeat CT in 3-6 month.  -We will repeat CT chest.

## 2017-02-01 ENCOUNTER — Ambulatory Visit (HOSPITAL_COMMUNITY): Payer: Medicare Other | Attending: Internal Medicine

## 2017-02-02 ENCOUNTER — Ambulatory Visit (INDEPENDENT_AMBULATORY_CARE_PROVIDER_SITE_OTHER): Payer: Medicare Other

## 2017-02-02 ENCOUNTER — Ambulatory Visit (INDEPENDENT_AMBULATORY_CARE_PROVIDER_SITE_OTHER): Payer: Medicare Other | Admitting: Orthopaedic Surgery

## 2017-02-02 DIAGNOSIS — M25562 Pain in left knee: Secondary | ICD-10-CM

## 2017-02-02 DIAGNOSIS — M1712 Unilateral primary osteoarthritis, left knee: Secondary | ICD-10-CM | POA: Diagnosis not present

## 2017-02-02 DIAGNOSIS — M1711 Unilateral primary osteoarthritis, right knee: Secondary | ICD-10-CM | POA: Diagnosis not present

## 2017-02-02 DIAGNOSIS — M25561 Pain in right knee: Secondary | ICD-10-CM | POA: Diagnosis not present

## 2017-02-03 DIAGNOSIS — M1711 Unilateral primary osteoarthritis, right knee: Secondary | ICD-10-CM | POA: Diagnosis not present

## 2017-02-03 DIAGNOSIS — M25561 Pain in right knee: Secondary | ICD-10-CM | POA: Diagnosis not present

## 2017-02-03 DIAGNOSIS — M1712 Unilateral primary osteoarthritis, left knee: Secondary | ICD-10-CM | POA: Diagnosis not present

## 2017-02-03 MED ORDER — LIDOCAINE HCL 1 % IJ SOLN
2.0000 mL | INTRAMUSCULAR | Status: AC | PRN
  Administered 2017-02-03: 2 mL

## 2017-02-03 MED ORDER — METHYLPREDNISOLONE ACETATE 40 MG/ML IJ SUSP
40.0000 mg | INTRAMUSCULAR | Status: AC | PRN
  Administered 2017-02-03: 40 mg via INTRA_ARTICULAR

## 2017-02-03 MED ORDER — BUPIVACAINE HCL 0.5 % IJ SOLN
2.0000 mL | INTRAMUSCULAR | Status: AC | PRN
  Administered 2017-02-03: 2 mL via INTRA_ARTICULAR
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
## 2017-02-05 NOTE — Progress Notes (Signed)
Internal Medicine Clinic Attending  Case discussed with Dr. Amin at the time of the visit.  We reviewed the resident's history and exam and pertinent patient test results.  I agree with the assessment, diagnosis, and plan of care documented in the resident's note.    

## 2017-02-07 ENCOUNTER — Emergency Department (HOSPITAL_COMMUNITY)
Admission: EM | Admit: 2017-02-07 | Discharge: 2017-02-07 | Disposition: A | Discharge status: Home / Self Care | Payer: Medicare Other | Attending: Emergency Medicine | Admitting: Emergency Medicine

## 2017-02-07 ENCOUNTER — Encounter (HOSPITAL_COMMUNITY): Payer: Self-pay | Admitting: Emergency Medicine

## 2017-02-07 ENCOUNTER — Emergency Department (HOSPITAL_COMMUNITY): Payer: Medicare Other

## 2017-02-07 DIAGNOSIS — Z794 Long term (current) use of insulin: Secondary | ICD-10-CM | POA: Diagnosis not present

## 2017-02-07 DIAGNOSIS — Z7951 Long term (current) use of inhaled steroids: Secondary | ICD-10-CM | POA: Insufficient documentation

## 2017-02-07 DIAGNOSIS — Z79899 Other long term (current) drug therapy: Secondary | ICD-10-CM | POA: Diagnosis not present

## 2017-02-07 DIAGNOSIS — K7460 Unspecified cirrhosis of liver: Secondary | ICD-10-CM | POA: Diagnosis not present

## 2017-02-07 DIAGNOSIS — E119 Type 2 diabetes mellitus without complications: Secondary | ICD-10-CM | POA: Insufficient documentation

## 2017-02-07 DIAGNOSIS — N6310 Unspecified lump in the right breast, unspecified quadrant: Secondary | ICD-10-CM | POA: Insufficient documentation

## 2017-02-07 DIAGNOSIS — Z7984 Long term (current) use of oral hypoglycemic drugs: Secondary | ICD-10-CM | POA: Insufficient documentation

## 2017-02-07 DIAGNOSIS — N898 Other specified noninflammatory disorders of vagina: Secondary | ICD-10-CM | POA: Insufficient documentation

## 2017-02-07 DIAGNOSIS — J45909 Unspecified asthma, uncomplicated: Secondary | ICD-10-CM | POA: Diagnosis not present

## 2017-02-07 DIAGNOSIS — J309 Allergic rhinitis, unspecified: Secondary | ICD-10-CM | POA: Insufficient documentation

## 2017-02-07 DIAGNOSIS — N6320 Unspecified lump in the left breast, unspecified quadrant: Secondary | ICD-10-CM | POA: Diagnosis not present

## 2017-02-07 DIAGNOSIS — I10 Essential (primary) hypertension: Secondary | ICD-10-CM | POA: Diagnosis not present

## 2017-02-07 DIAGNOSIS — N63 Unspecified lump in unspecified breast: Secondary | ICD-10-CM

## 2017-02-07 DIAGNOSIS — Z87891 Personal history of nicotine dependence: Secondary | ICD-10-CM | POA: Insufficient documentation

## 2017-02-07 DIAGNOSIS — R079 Chest pain, unspecified: Secondary | ICD-10-CM | POA: Diagnosis not present

## 2017-02-07 LAB — URINALYSIS, ROUTINE W REFLEX MICROSCOPIC
Bilirubin Urine: NEGATIVE
GLUCOSE, UA: NEGATIVE mg/dL
HGB URINE DIPSTICK: NEGATIVE
Ketones, ur: NEGATIVE mg/dL
LEUKOCYTES UA: NEGATIVE
Nitrite: NEGATIVE
PH: 5 (ref 5.0–8.0)
Protein, ur: NEGATIVE mg/dL
Specific Gravity, Urine: 1.024 (ref 1.005–1.030)

## 2017-02-07 LAB — WET PREP, GENITAL
Clue Cells Wet Prep HPF POC: NONE SEEN
Sperm: NONE SEEN
TRICH WET PREP: NONE SEEN
YEAST WET PREP: NONE SEEN

## 2017-02-07 LAB — POC URINE PREG, ED: Preg Test, Ur: NEGATIVE

## 2017-02-07 LAB — HIV ANTIBODY (ROUTINE TESTING W REFLEX): HIV Screen 4th Generation wRfx: NONREACTIVE

## 2017-02-07 MED ORDER — AZITHROMYCIN 250 MG PO TABS
1000.0000 mg | ORAL_TABLET | Freq: Once | ORAL | Status: AC
  Administered 2017-02-07: 1000 mg via ORAL
  Filled 2017-02-07: qty 4

## 2017-02-07 MED ORDER — LIDOCAINE HCL (PF) 1 % IJ SOLN
INTRAMUSCULAR | Status: AC
  Filled 2017-02-07: qty 5

## 2017-02-07 MED ORDER — LIDOCAINE HCL (PF) 1 % IJ SOLN
0.9000 mL | Freq: Once | INTRAMUSCULAR | Status: AC
  Administered 2017-02-07: 0.9 mL

## 2017-02-07 MED ORDER — CEFTRIAXONE SODIUM 250 MG IJ SOLR
250.0000 mg | Freq: Once | INTRAMUSCULAR | Status: AC
  Administered 2017-02-07: 250 mg via INTRAMUSCULAR
  Filled 2017-02-07: qty 250

## 2017-02-07 NOTE — ED Notes (Signed)
Pelvic cart at bedside. 

## 2017-02-07 NOTE — ED Notes (Signed)
Patient returned from xray.
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
## 2017-02-07 NOTE — ED Triage Notes (Signed)
Patient complaining of nasal congestion and drainage along with Chest pain 8/10 that has been recurrent for several weeks now. Patient also complaining of vaginal discharge and itching x 1 week. Patient alert and oriented x 4.

## 2017-02-07 NOTE — ED Notes (Signed)
Patient transported to X-ray 

## 2017-02-07 NOTE — Discharge Instructions (Signed)
I think that ER nasal congestion and sinus pain is caused by an allergic irritation in your sinuses and nasal passages. Continue Flonase. Take Zyrtec 10 mg daily, sold over-the-counter. For your breast mass, please follow-up with your family doctor and with the breast center for ultrasound and mammogram. Your cultures obtained during your exam are pending and if they come back abnormal we will contact you. Follow-up with family doctor as needed.

## 2017-02-08 LAB — GC/CHLAMYDIA PROBE AMP (~~LOC~~) NOT AT ARMC
Chlamydia: NEGATIVE
NEISSERIA GONORRHEA: NEGATIVE

## 2017-02-08 LAB — RPR: RPR Ser Ql: REACTIVE — AB

## 2017-02-08 LAB — RPR, QUANT+TP ABS (REFLEX)
Rapid Plasma Reagin, Quant: 1:1 {titer} — ABNORMAL HIGH
TREPONEMA PALLIDUM AB: POSITIVE — AB

## 2017-02-08 NOTE — ED Notes (Signed)
Pt. Called for results, they have not resulted.  Informed pt. That they take a few days and I will call her if they are positive.  I also told pt. To call me tomorrow to check again.

## 2017-02-12 ENCOUNTER — Emergency Department (HOSPITAL_COMMUNITY)
Admission: EM | Admit: 2017-02-12 | Discharge: 2017-02-12 | Disposition: A | Discharge status: Home / Self Care | Payer: Medicare Other | Attending: Emergency Medicine | Admitting: Emergency Medicine

## 2017-02-12 ENCOUNTER — Encounter (HOSPITAL_COMMUNITY): Payer: Self-pay

## 2017-02-12 DIAGNOSIS — Z79899 Other long term (current) drug therapy: Secondary | ICD-10-CM | POA: Diagnosis not present

## 2017-02-12 DIAGNOSIS — Z87891 Personal history of nicotine dependence: Secondary | ICD-10-CM | POA: Insufficient documentation

## 2017-02-12 DIAGNOSIS — A539 Syphilis, unspecified: Secondary | ICD-10-CM | POA: Diagnosis not present

## 2017-02-12 DIAGNOSIS — J45909 Unspecified asthma, uncomplicated: Secondary | ICD-10-CM | POA: Diagnosis not present

## 2017-02-12 DIAGNOSIS — I10 Essential (primary) hypertension: Secondary | ICD-10-CM | POA: Diagnosis not present

## 2017-02-12 DIAGNOSIS — E119 Type 2 diabetes mellitus without complications: Secondary | ICD-10-CM | POA: Diagnosis not present

## 2017-02-12 DIAGNOSIS — Z91040 Latex allergy status: Secondary | ICD-10-CM | POA: Diagnosis not present

## 2017-02-12 DIAGNOSIS — Z202 Contact with and (suspected) exposure to infections with a predominantly sexual mode of transmission: Secondary | ICD-10-CM | POA: Diagnosis present

## 2017-02-12 DIAGNOSIS — Z794 Long term (current) use of insulin: Secondary | ICD-10-CM | POA: Diagnosis not present

## 2017-02-12 HISTORY — DX: Unspecified osteoarthritis, unspecified site: M19.90

## 2017-02-12 HISTORY — DX: Unspecified lump in unspecified breast: N63.0

## 2017-02-12 HISTORY — DX: Other nonspecific abnormal finding of lung field: R91.8

## 2017-02-12 MED ORDER — DOXYCYCLINE HYCLATE 100 MG PO CAPS: 100.0000 mg | ORAL_CAPSULE | Freq: Two times a day (BID) | ORAL | 0 refills | Status: DC

## 2017-02-12 NOTE — ED Triage Notes (Signed)
Per Pt, Pt was seen here a couple weeks ago. Pt was called to come back and get treated for Syphyllis.

## 2017-02-12 NOTE — ED Notes (Signed)
When asked if she was having any thoughts of harming herself, Pt reported "I am not going to answer that. I am not going to some mental ward."  When asked directly about suicidal thoughts, pt denied any at this time. Pt reassured that we have resources and counselors that we could provide her with at any time.

## 2017-02-12 NOTE — Discharge Instructions (Signed)
Take the medication as directed. Follow up with your doctor.  °
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
## 2017-02-19 ENCOUNTER — Encounter: Payer: Medicare Other | Admitting: Podiatry

## 2017-03-07 ENCOUNTER — Encounter: Payer: Self-pay | Admitting: Internal Medicine

## 2017-03-08 ENCOUNTER — Ambulatory Visit: Payer: Medicare Other | Admitting: Internal Medicine

## 2017-03-11 NOTE — Progress Notes (Signed)
This encounter was created in error - please disregard.

## 2017-03-19 ENCOUNTER — Encounter: Payer: Medicare Other | Admitting: Podiatry

## 2017-03-19 NOTE — Addendum Note (Signed)
Addended by: Neomia DearPOWERS, Lyndsee Casa E on: 03/19/2017 06:23 PM   Modules accepted: Orders

## 2017-03-22 ENCOUNTER — Ambulatory Visit: Payer: Medicare Other

## 2017-03-26 ENCOUNTER — Other Ambulatory Visit: Payer: Self-pay | Admitting: Internal Medicine

## 2017-03-26 DIAGNOSIS — N898 Other specified noninflammatory disorders of vagina: Secondary | ICD-10-CM

## 2017-03-26 NOTE — Telephone Encounter (Signed)
Pt requesting a refill on Diflucan .

## 2017-03-28 ENCOUNTER — Other Ambulatory Visit: Payer: Self-pay | Admitting: Internal Medicine

## 2017-03-28 DIAGNOSIS — M25561 Pain in right knee: Principal | ICD-10-CM

## 2017-03-28 DIAGNOSIS — G8929 Other chronic pain: Secondary | ICD-10-CM

## 2017-03-28 DIAGNOSIS — M25562 Pain in left knee: Principal | ICD-10-CM

## 2017-04-30 ENCOUNTER — Ambulatory Visit: Payer: Medicare Other | Admitting: Gastroenterology

## 2017-05-14 NOTE — Progress Notes (Signed)
This encounter was created in error - please disregard.

## 2017-05-17 ENCOUNTER — Ambulatory Visit (INDEPENDENT_AMBULATORY_CARE_PROVIDER_SITE_OTHER): Payer: Medicare Other | Admitting: Orthopaedic Surgery

## 2017-05-21 ENCOUNTER — Other Ambulatory Visit: Payer: Self-pay | Admitting: Internal Medicine

## 2017-05-21 DIAGNOSIS — M79672 Pain in left foot: Secondary | ICD-10-CM

## 2017-05-21 DIAGNOSIS — M25561 Pain in right knee: Principal | ICD-10-CM

## 2017-05-21 DIAGNOSIS — G8929 Other chronic pain: Secondary | ICD-10-CM

## 2017-05-21 DIAGNOSIS — M25562 Pain in left knee: Principal | ICD-10-CM

## 2017-05-25 ENCOUNTER — Encounter: Payer: Medicare Other | Admitting: Internal Medicine

## 2017-05-28 ENCOUNTER — Ambulatory Visit: Payer: Medicare Other | Admitting: Podiatry

## 2017-06-07 ENCOUNTER — Other Ambulatory Visit: Payer: Self-pay | Admitting: Internal Medicine

## 2017-06-07 DIAGNOSIS — I10 Essential (primary) hypertension: Secondary | ICD-10-CM

## 2017-06-07 DIAGNOSIS — M79672 Pain in left foot: Secondary | ICD-10-CM

## 2017-06-19 ENCOUNTER — Ambulatory Visit: Payer: Medicare Other | Admitting: Gastroenterology

## 2017-06-25 ENCOUNTER — Ambulatory Visit: Payer: Medicare Other | Admitting: Podiatry

## 2017-06-27 ENCOUNTER — Other Ambulatory Visit: Payer: Self-pay | Admitting: Internal Medicine

## 2017-06-27 DIAGNOSIS — M25562 Pain in left knee: Principal | ICD-10-CM

## 2017-06-27 DIAGNOSIS — G8929 Other chronic pain: Secondary | ICD-10-CM

## 2017-06-27 DIAGNOSIS — M25561 Pain in right knee: Principal | ICD-10-CM

## 2017-06-28 ENCOUNTER — Other Ambulatory Visit: Payer: Self-pay | Admitting: *Deleted

## 2017-07-20 ENCOUNTER — Encounter: Payer: Medicare Other | Admitting: Internal Medicine

## 2017-07-30 ENCOUNTER — Ambulatory Visit: Payer: Medicare Other | Admitting: Podiatry

## 2017-08-08 ENCOUNTER — Other Ambulatory Visit: Payer: Self-pay | Admitting: Internal Medicine

## 2017-08-08 DIAGNOSIS — M79672 Pain in left foot: Secondary | ICD-10-CM

## 2017-08-09 NOTE — Telephone Encounter (Signed)
Next appt scheduled  08/31/17 with PCP. 

## 2017-08-15 ENCOUNTER — Ambulatory Visit (HOSPITAL_COMMUNITY)
Admission: EM | Admit: 2017-08-15 | Discharge: 2017-08-15 | Disposition: A | Discharge status: Home / Self Care | Payer: Medicare Other | Attending: Family Medicine | Admitting: Family Medicine

## 2017-08-15 ENCOUNTER — Encounter (HOSPITAL_COMMUNITY): Payer: Self-pay | Admitting: Emergency Medicine

## 2017-08-15 ENCOUNTER — Other Ambulatory Visit: Payer: Self-pay

## 2017-08-15 DIAGNOSIS — F419 Anxiety disorder, unspecified: Secondary | ICD-10-CM | POA: Insufficient documentation

## 2017-08-15 DIAGNOSIS — I10 Essential (primary) hypertension: Secondary | ICD-10-CM | POA: Insufficient documentation

## 2017-08-15 DIAGNOSIS — E7800 Pure hypercholesterolemia, unspecified: Secondary | ICD-10-CM | POA: Diagnosis not present

## 2017-08-15 DIAGNOSIS — K7460 Unspecified cirrhosis of liver: Secondary | ICD-10-CM | POA: Diagnosis not present

## 2017-08-15 DIAGNOSIS — F17210 Nicotine dependence, cigarettes, uncomplicated: Secondary | ICD-10-CM | POA: Insufficient documentation

## 2017-08-15 DIAGNOSIS — F319 Bipolar disorder, unspecified: Secondary | ICD-10-CM | POA: Insufficient documentation

## 2017-08-15 DIAGNOSIS — N898 Other specified noninflammatory disorders of vagina: Secondary | ICD-10-CM | POA: Diagnosis not present

## 2017-08-15 DIAGNOSIS — Z202 Contact with and (suspected) exposure to infections with a predominantly sexual mode of transmission: Secondary | ICD-10-CM | POA: Diagnosis not present

## 2017-08-15 DIAGNOSIS — M109 Gout, unspecified: Secondary | ICD-10-CM | POA: Insufficient documentation

## 2017-08-15 DIAGNOSIS — Z880 Allergy status to penicillin: Secondary | ICD-10-CM | POA: Diagnosis not present

## 2017-08-15 DIAGNOSIS — G629 Polyneuropathy, unspecified: Secondary | ICD-10-CM | POA: Diagnosis not present

## 2017-08-15 DIAGNOSIS — E119 Type 2 diabetes mellitus without complications: Secondary | ICD-10-CM | POA: Diagnosis not present

## 2017-08-15 DIAGNOSIS — F410 Panic disorder [episodic paroxysmal anxiety] without agoraphobia: Secondary | ICD-10-CM | POA: Diagnosis not present

## 2017-08-15 DIAGNOSIS — Z113 Encounter for screening for infections with a predominantly sexual mode of transmission: Secondary | ICD-10-CM | POA: Diagnosis not present

## 2017-08-15 LAB — POCT URINALYSIS DIP (DEVICE)
BILIRUBIN URINE: NEGATIVE
GLUCOSE, UA: NEGATIVE mg/dL
Hgb urine dipstick: NEGATIVE
Ketones, ur: NEGATIVE mg/dL
LEUKOCYTES UA: NEGATIVE
NITRITE: NEGATIVE
Protein, ur: NEGATIVE mg/dL
Specific Gravity, Urine: 1.015 (ref 1.005–1.030)
UROBILINOGEN UA: 0.2 mg/dL (ref 0.0–1.0)
pH: 6 (ref 5.0–8.0)

## 2017-08-15 MED ORDER — DOXYCYCLINE HYCLATE 100 MG PO CAPS: 100.0000 mg | ORAL_CAPSULE | Freq: Two times a day (BID) | ORAL | 0 refills | Status: DC

## 2017-08-15 MED ORDER — FLUCONAZOLE 150 MG PO TABS: 150.0000 mg | ORAL_TABLET | Freq: Once | ORAL | 0 refills | Status: DC | PRN

## 2017-08-15 NOTE — ED Provider Notes (Signed)
[facility]   [id number] 08/15/17 Arrival Time: 1305  ASSESSMENT & PLAN:  1. Exposure to syphilis   2. Vaginal discharge   3. Screen for STD (sexually transmitted disease)    PCN allergic.  Meds ordered this encounter  Medications  . doxycycline (VIBRAMYCIN) 100 MG capsule    Sig: Take 1 capsule (100 mg total) by mouth 2 (two) times daily.    Dispense:  28 capsule    Refill:  0  . fluconazole (DIFLUCAN) 150 MG tablet    Sig: Take 1 tablet (150 mg total) by mouth once as needed. Yeast infection    Dispense:  3 tablet    Refill:  0   Requests Diflucan as she gets yeast infections with antibiotic use. RPR pending. Urine cytology sent at patient request. Will notify of any positive results. Instructed to refrain from sexual activity for at least seven days.  Reviewed expectations re: course of current medical issues. Questions answered. Outlined signs and symptoms indicating need for more acute intervention. Patient verbalized understanding. After Visit Summary given.   SUBJECTIVE:  Hannah BellowsShirley M Young is a 52 y.o. female who reports possible exposure to syphilis. A female sexual partner of hers was recently diagnosed. She is without symptoms. Reports that she was previously diagnosed with syphilis in 02/2017 and treated. Urinary symptoms: none. Afebrile. No abdominal or pelvic pain. No n/v. No rashes or lesions.  No LMP recorded. Patient is not currently having periods (Reason: Perimenopausal).  ROS: As per HPI.  OBJECTIVE:  Vitals:   08/15/17 1354  BP: 118/87  Pulse: 96  Temp: 98 F (36.7 C)  TempSrc: Oral  SpO2: 100%    General appearance: alert, cooperative, appears stated age and no distress Throat: lips, mucosa, and tongue normal; teeth and gums normal Back: no CVA tenderness Abdomen: soft, non-tender; bowel sounds normal; no masses or organomegaly; no guarding or rebound tenderness GU: declines Skin: warm and dry Psychological:  Alert and cooperative.  Normal mood and affect.  Results for orders placed or performed during the hospital encounter of 08/15/17  POCT urinalysis dip (device)  Result Value Ref Range   Glucose, UA NEGATIVE NEGATIVE mg/dL   Bilirubin Urine NEGATIVE NEGATIVE   Ketones, ur NEGATIVE NEGATIVE mg/dL   Specific Gravity, Urine 1.015 1.005 - 1.030   Hgb urine dipstick NEGATIVE NEGATIVE   pH 6.0 5.0 - 8.0   Protein, ur NEGATIVE NEGATIVE mg/dL   Urobilinogen, UA 0.2 0.0 - 1.0 mg/dL   Nitrite NEGATIVE NEGATIVE   Leukocytes, UA NEGATIVE NEGATIVE    Labs Reviewed  RPR  POCT URINALYSIS DIP (DEVICE)  URINE CYTOLOGY ANCILLARY ONLY    No results found.  Allergies  Allergen Reactions  . Tylenol [Acetaminophen] Other (See Comments)    Patient has liver disease, MD has stated that SHE CANNOT TAKE ANY TYLENOL EVER.   . Augmentin [Amoxicillin-Pot Clavulanate] Swelling    tongue swelling  . Latex Rash    Past Medical History:  Diagnosis Date  . Anxiety   . Arthritis   . Asthma   . Bipolar 1 disorder (HCC)   . Breast lump   . Cirrhosis (HCC)   . Depression   . Diabetes mellitus   . FH: chemotherapy   . Gout   . Hepatitis B infection   . High cholesterol   . Hyperglycemia   . Hypertension   . Liver cirrhosis (HCC)   . Lung nodules   . Neuropathy   . Panic disorder   .  Vertigo    Family History  Problem Relation Age of Onset  . Colon cancer Neg Hx   . Stomach cancer Neg Hx    Social History   Socioeconomic History  . Marital status: Legally Separated    Spouse name: Not on file  . Number of children: Not on file  . Years of education: Not on file  . Highest education level: Not on file  Social Needs  . Financial resource strain: Not on file  . Food insecurity - worry: Not on file  . Food insecurity - inability: Not on file  . Transportation needs - medical: Not on file  . Transportation needs - non-medical: Not on file  Occupational History  . Not on file  Tobacco Use  . Smoking status:  Current Every Day Smoker    Packs/day: 0.25    Types: Cigarettes  . Smokeless tobacco: Never Used  . Tobacco comment: 4 cigs  per day  Substance and Sexual Activity  . Alcohol use: No    Alcohol/week: 1.8 oz    Types: 3 Cans of beer per week    Comment: ocassional  . Drug use: No  . Sexual activity: Yes    Birth control/protection: Surgical  Other Topics Concern  . Not on file  Social History Narrative  . Not on file          Mardella Layman, MD 08/15/17 1430

## 2017-08-15 NOTE — ED Triage Notes (Signed)
Pt was treated for Syphilis at the beginning of July of last year.  Pt states she was with someone two weeks ago that has told her they have tested positive for Syphilis.  Pt is wanting treatment just in case.

## 2017-08-15 NOTE — ED Notes (Signed)
Lab at bedside drawing labs

## 2017-08-15 NOTE — Discharge Instructions (Addendum)
Fill your prescription for doxycycline and take for two weeks as directed. This will treat syphilis since you are allergic to penicillin.  We have sent testing for sexually transmitted infections. We will notify you of any positive results once they are received. If required, we will prescribe any medications you might need.

## 2017-08-16 ENCOUNTER — Ambulatory Visit: Payer: Medicare Other | Admitting: Gastroenterology

## 2017-08-16 LAB — URINE CYTOLOGY ANCILLARY ONLY
CHLAMYDIA, DNA PROBE: NEGATIVE
Neisseria Gonorrhea: NEGATIVE
TRICH (WINDOWPATH): NEGATIVE

## 2017-08-16 LAB — RPR, QUANT+TP ABS (REFLEX)
Rapid Plasma Reagin, Quant: 1:1 {titer} — ABNORMAL HIGH
T Pallidum Abs: POSITIVE — AB

## 2017-08-16 LAB — RPR: RPR: REACTIVE — AB

## 2017-08-17 ENCOUNTER — Telehealth (HOSPITAL_COMMUNITY): Payer: Self-pay | Admitting: *Deleted

## 2017-08-17 ENCOUNTER — Telehealth (HOSPITAL_COMMUNITY): Payer: Self-pay | Admitting: Emergency Medicine

## 2017-08-17 NOTE — Telephone Encounter (Signed)
Verified patient identity with 2 identifiers.  Informed patient of positive for syphilis.  Instructed to take doxycycline completely and call infectious disease, provided patient with number and reason why she needed to follow up. Patient states understanding

## 2017-08-20 LAB — URINE CYTOLOGY ANCILLARY ONLY
Bacterial vaginitis: NEGATIVE
CANDIDA VAGINITIS: NEGATIVE

## 2017-08-27 ENCOUNTER — Ambulatory Visit: Payer: Medicare Other | Admitting: Podiatry

## 2017-08-30 ENCOUNTER — Other Ambulatory Visit: Payer: Self-pay | Admitting: Internal Medicine

## 2017-08-30 DIAGNOSIS — J4520 Mild intermittent asthma, uncomplicated: Secondary | ICD-10-CM

## 2017-08-30 NOTE — Telephone Encounter (Signed)
Next appt scheduled 11/8 with PCP. 

## 2017-08-31 ENCOUNTER — Telehealth: Payer: Self-pay | Admitting: Internal Medicine

## 2017-08-31 ENCOUNTER — Encounter: Payer: Medicare Other | Admitting: Internal Medicine

## 2017-08-31 NOTE — Telephone Encounter (Signed)
PT CALLED SHE AND GRAND KIDS HAVE LICE STARTED TREATMENT FOR IT YESTERDAY, HAD TO RESCHEDULE HER APPT TODAY.

## 2017-09-04 ENCOUNTER — Telehealth: Payer: Self-pay | Admitting: Gastroenterology

## 2017-09-04 NOTE — Telephone Encounter (Signed)
Patient has reschedule new patient ov 4 times since 7.26.18. [facility]

## 2017-09-04 NOTE — Telephone Encounter (Signed)
Noted in appt notes

## 2017-09-04 NOTE — Telephone Encounter (Signed)
Thank you for the information. If this patient reschedules within three days prior to next appointment or does not show, do not re-schedule and fax note to referring physician to inform them.  - HD

## 2017-09-07 ENCOUNTER — Ambulatory Visit: Payer: Medicare Other | Admitting: Gastroenterology

## 2017-09-21 ENCOUNTER — Encounter: Payer: Medicare Other | Admitting: Internal Medicine

## 2017-10-01 ENCOUNTER — Ambulatory Visit: Payer: Medicare Other | Admitting: Podiatry

## 2017-10-08 ENCOUNTER — Ambulatory Visit: Payer: Medicare Other | Admitting: Podiatry

## 2017-10-11 ENCOUNTER — Ambulatory Visit: Payer: Medicare Other | Admitting: Gastroenterology

## 2017-10-26 ENCOUNTER — Encounter: Payer: Medicare Other | Admitting: Internal Medicine

## 2017-10-30 ENCOUNTER — Other Ambulatory Visit: Payer: Self-pay

## 2017-10-30 ENCOUNTER — Other Ambulatory Visit: Payer: Self-pay | Admitting: *Deleted

## 2017-10-30 DIAGNOSIS — N898 Other specified noninflammatory disorders of vagina: Secondary | ICD-10-CM

## 2017-10-30 NOTE — Telephone Encounter (Signed)
[facility]

## 2017-10-30 NOTE — Telephone Encounter (Signed)
addressed

## 2017-10-30 NOTE — Telephone Encounter (Signed)
fluconazole (DIFLUCAN) 150 MG tablet, [facility]

## 2017-11-02 ENCOUNTER — Ambulatory Visit: Payer: Medicare Other

## 2017-11-07 ENCOUNTER — Ambulatory Visit: Payer: Medicare Other | Admitting: Podiatry

## 2017-11-09 ENCOUNTER — Other Ambulatory Visit: Payer: Self-pay | Admitting: Internal Medicine

## 2017-11-09 DIAGNOSIS — J4520 Mild intermittent asthma, uncomplicated: Secondary | ICD-10-CM

## 2017-11-14 ENCOUNTER — Ambulatory Visit: Payer: Medicare Other | Admitting: Gastroenterology

## 2017-11-14 ENCOUNTER — Other Ambulatory Visit: Payer: Self-pay

## 2017-11-14 NOTE — Addendum Note (Signed)
Addended by: Neomia DearPOWERS, Shaundra Fullam E on: 11/14/2017 08:27 PM   Modules accepted: Orders

## 2017-11-16 ENCOUNTER — Telehealth: Payer: Self-pay | Admitting: Gastroenterology

## 2017-11-16 NOTE — Telephone Encounter (Signed)
[facility] dismissed [facility] by Amada JupiterHenry Danis III MD , effective November 14, 2017. Dismissal letter sent out by certified / registered mail.  daj

## 2017-11-21 ENCOUNTER — Other Ambulatory Visit: Payer: Self-pay

## 2017-12-10 ENCOUNTER — Ambulatory Visit: Payer: Medicare Other | Admitting: Podiatry

## 2017-12-14 ENCOUNTER — Encounter: Payer: Medicare Other | Admitting: Internal Medicine

## 2017-12-18 NOTE — Telephone Encounter (Signed)
Understood 

## 2017-12-18 NOTE — Telephone Encounter (Signed)
Certified dismissal letter returned as undeliverable, unclaimed, return to sender after three attempts by [employer] Dec 18, 2017. Letter placed in another envelope and resent as 1st class mail which does not require a signature. daj

## 2017-12-31 ENCOUNTER — Ambulatory Visit: Payer: Medicare Other | Admitting: Podiatry

## 2018-01-11 ENCOUNTER — Encounter: Payer: Medicare Other | Admitting: Internal Medicine

## 2018-01-16 ENCOUNTER — Emergency Department (HOSPITAL_COMMUNITY): Payer: Medicare Other

## 2018-01-16 ENCOUNTER — Inpatient Hospital Stay (HOSPITAL_COMMUNITY)
Admission: EM | Admit: 2018-01-16 | Discharge: 2018-01-18 | DRG: 443 | Disposition: A | Discharge status: Home / Self Care | Payer: Medicare Other | Attending: Oncology | Admitting: Oncology

## 2018-01-16 ENCOUNTER — Encounter (HOSPITAL_COMMUNITY): Payer: Self-pay | Admitting: *Deleted

## 2018-01-16 DIAGNOSIS — I10 Essential (primary) hypertension: Secondary | ICD-10-CM | POA: Diagnosis present

## 2018-01-16 DIAGNOSIS — F419 Anxiety disorder, unspecified: Secondary | ICD-10-CM | POA: Diagnosis present

## 2018-01-16 DIAGNOSIS — Z720 Tobacco use: Secondary | ICD-10-CM | POA: Diagnosis not present

## 2018-01-16 DIAGNOSIS — F1099 Alcohol use, unspecified with unspecified alcohol-induced disorder: Secondary | ICD-10-CM | POA: Diagnosis not present

## 2018-01-16 DIAGNOSIS — N920 Excessive and frequent menstruation with regular cycle: Secondary | ICD-10-CM | POA: Diagnosis not present

## 2018-01-16 DIAGNOSIS — Z886 Allergy status to analgesic agent status: Secondary | ICD-10-CM | POA: Diagnosis not present

## 2018-01-16 DIAGNOSIS — B9689 Other specified bacterial agents as the cause of diseases classified elsewhere: Secondary | ICD-10-CM

## 2018-01-16 DIAGNOSIS — M109 Gout, unspecified: Secondary | ICD-10-CM | POA: Diagnosis present

## 2018-01-16 DIAGNOSIS — R945 Abnormal results of liver function studies: Secondary | ICD-10-CM | POA: Diagnosis not present

## 2018-01-16 DIAGNOSIS — K754 Autoimmune hepatitis: Principal | ICD-10-CM | POA: Diagnosis present

## 2018-01-16 DIAGNOSIS — Z79891 Long term (current) use of opiate analgesic: Secondary | ICD-10-CM

## 2018-01-16 DIAGNOSIS — N840 Polyp of corpus uteri: Secondary | ICD-10-CM | POA: Diagnosis not present

## 2018-01-16 DIAGNOSIS — Z79899 Other long term (current) drug therapy: Secondary | ICD-10-CM | POA: Diagnosis not present

## 2018-01-16 DIAGNOSIS — R7989 Other specified abnormal findings of blood chemistry: Secondary | ICD-10-CM | POA: Diagnosis present

## 2018-01-16 DIAGNOSIS — D649 Anemia, unspecified: Secondary | ICD-10-CM | POA: Diagnosis present

## 2018-01-16 DIAGNOSIS — R918 Other nonspecific abnormal finding of lung field: Secondary | ICD-10-CM | POA: Diagnosis present

## 2018-01-16 DIAGNOSIS — Z794 Long term (current) use of insulin: Secondary | ICD-10-CM

## 2018-01-16 DIAGNOSIS — R634 Abnormal weight loss: Secondary | ICD-10-CM | POA: Diagnosis not present

## 2018-01-16 DIAGNOSIS — Z9119 Patient's noncompliance with other medical treatment and regimen: Secondary | ICD-10-CM

## 2018-01-16 DIAGNOSIS — F17210 Nicotine dependence, cigarettes, uncomplicated: Secondary | ICD-10-CM | POA: Diagnosis present

## 2018-01-16 DIAGNOSIS — Z6837 Body mass index (BMI) 37.0-37.9, adult: Secondary | ICD-10-CM

## 2018-01-16 DIAGNOSIS — N898 Other specified noninflammatory disorders of vagina: Secondary | ICD-10-CM | POA: Diagnosis present

## 2018-01-16 DIAGNOSIS — J45909 Unspecified asthma, uncomplicated: Secondary | ICD-10-CM | POA: Diagnosis present

## 2018-01-16 DIAGNOSIS — E119 Type 2 diabetes mellitus without complications: Secondary | ICD-10-CM

## 2018-01-16 DIAGNOSIS — R110 Nausea: Secondary | ICD-10-CM | POA: Diagnosis not present

## 2018-01-16 DIAGNOSIS — K2970 Gastritis, unspecified, without bleeding: Secondary | ICD-10-CM | POA: Diagnosis present

## 2018-01-16 DIAGNOSIS — Z881 Allergy status to other antibiotic agents status: Secondary | ICD-10-CM

## 2018-01-16 DIAGNOSIS — N83291 Other ovarian cyst, right side: Secondary | ICD-10-CM | POA: Diagnosis not present

## 2018-01-16 DIAGNOSIS — Z8619 Personal history of other infectious and parasitic diseases: Secondary | ICD-10-CM | POA: Diagnosis not present

## 2018-01-16 DIAGNOSIS — E1169 Type 2 diabetes mellitus with other specified complication: Secondary | ICD-10-CM

## 2018-01-16 DIAGNOSIS — Z9221 Personal history of antineoplastic chemotherapy: Secondary | ICD-10-CM

## 2018-01-16 DIAGNOSIS — Y636 Underdosing and nonadministration of necessary drug, medicament or biological substance: Secondary | ICD-10-CM | POA: Diagnosis present

## 2018-01-16 DIAGNOSIS — Z7951 Long term (current) use of inhaled steroids: Secondary | ICD-10-CM | POA: Diagnosis not present

## 2018-01-16 DIAGNOSIS — R1010 Upper abdominal pain, unspecified: Secondary | ICD-10-CM | POA: Diagnosis not present

## 2018-01-16 DIAGNOSIS — K5730 Diverticulosis of large intestine without perforation or abscess without bleeding: Secondary | ICD-10-CM | POA: Diagnosis not present

## 2018-01-16 DIAGNOSIS — N760 Acute vaginitis: Secondary | ICD-10-CM | POA: Diagnosis not present

## 2018-01-16 DIAGNOSIS — F319 Bipolar disorder, unspecified: Secondary | ICD-10-CM | POA: Diagnosis present

## 2018-01-16 DIAGNOSIS — Z91040 Latex allergy status: Secondary | ICD-10-CM

## 2018-01-16 DIAGNOSIS — E1140 Type 2 diabetes mellitus with diabetic neuropathy, unspecified: Secondary | ICD-10-CM | POA: Diagnosis present

## 2018-01-16 DIAGNOSIS — Z880 Allergy status to penicillin: Secondary | ICD-10-CM

## 2018-01-16 DIAGNOSIS — K219 Gastro-esophageal reflux disease without esophagitis: Secondary | ICD-10-CM | POA: Diagnosis present

## 2018-01-16 LAB — COMPREHENSIVE METABOLIC PANEL
ALK PHOS: 135 U/L — AB (ref 38–126)
ALT: 481 U/L — ABNORMAL HIGH (ref 14–54)
AST: 714 U/L — AB (ref 15–41)
Albumin: 3.2 g/dL — ABNORMAL LOW (ref 3.5–5.0)
Anion gap: 8 (ref 5–15)
BILIRUBIN TOTAL: 5.6 mg/dL — AB (ref 0.3–1.2)
BUN: 6 mg/dL (ref 6–20)
CO2: 25 mmol/L (ref 22–32)
CREATININE: 0.48 mg/dL (ref 0.44–1.00)
Calcium: 8.8 mg/dL — ABNORMAL LOW (ref 8.9–10.3)
Chloride: 105 mmol/L (ref 101–111)
GFR calc Af Amer: >60 mL/min (ref >60)
GFR calc non Af Amer: >60 mL/min (ref >60)
Glucose, Bld: 117 mg/dL — ABNORMAL HIGH (ref 65–99)
POTASSIUM: 4 mmol/L (ref 3.5–5.1)
Sodium: 138 mmol/L (ref 135–145)
Total Protein: 8.1 g/dL (ref 6.5–8.1)

## 2018-01-16 LAB — LIPASE, BLOOD: Lipase: 23 U/L (ref 11–51)

## 2018-01-16 LAB — PROTIME-INR
INR: 1
PROTHROMBIN TIME: 13.1 s (ref 11.4–15.2)

## 2018-01-16 LAB — CBC
HEMATOCRIT: 34.3 % — AB (ref 36.0–46.0)
Hemoglobin: 11.2 g/dL — ABNORMAL LOW (ref 12.0–15.0)
MCH: 30 pg (ref 26.0–34.0)
MCHC: 32.7 g/dL (ref 30.0–36.0)
MCV: 92 fL (ref 78.0–100.0)
PLATELETS: 651 10*3/uL — AB (ref 150–400)
RBC: 3.73 MIL/uL — ABNORMAL LOW (ref 3.87–5.11)
RDW: 20.3 % — AB (ref 11.5–15.5)
WBC: 8.3 10*3/uL (ref 4.0–10.5)

## 2018-01-16 LAB — URINALYSIS, ROUTINE W REFLEX MICROSCOPIC
Glucose, UA: NEGATIVE mg/dL
Hgb urine dipstick: NEGATIVE
KETONES UR: NEGATIVE mg/dL
LEUKOCYTES UA: NEGATIVE
NITRITE: NEGATIVE
PROTEIN: NEGATIVE mg/dL
Specific Gravity, Urine: 1.019 (ref 1.005–1.030)
pH: 6 (ref 5.0–8.0)

## 2018-01-16 LAB — GLUCOSE, CAPILLARY: GLUCOSE-CAPILLARY: 162 mg/dL — AB (ref 65–99)

## 2018-01-16 LAB — WET PREP, GENITAL
SPERM: NONE SEEN
Trich, Wet Prep: NONE SEEN
Yeast Wet Prep HPF POC: NONE SEEN

## 2018-01-16 LAB — APTT: aPTT: 30 seconds (ref 24–36)

## 2018-01-16 LAB — PREGNANCY, URINE: PREG TEST UR: NEGATIVE

## 2018-01-16 LAB — AMMONIA: Ammonia: 61 umol/L — ABNORMAL HIGH (ref 9–35)

## 2018-01-16 MED ORDER — HYDROXYZINE HCL 25 MG PO TABS
25.0000 mg | ORAL_TABLET | Freq: Once | ORAL | Status: AC
  Administered 2018-01-16: 25 mg via ORAL
  Filled 2018-01-16: qty 1

## 2018-01-16 MED ORDER — SODIUM CHLORIDE 0.9 % IV BOLUS
1000.0000 mL | Freq: Once | INTRAVENOUS | Status: AC
  Administered 2018-01-16: 1000 mL via INTRAVENOUS

## 2018-01-16 MED ORDER — IOHEXOL 300 MG/ML  SOLN
100.0000 mL | Freq: Once | INTRAMUSCULAR | Status: AC | PRN
  Administered 2018-01-16: 100 mL via INTRAVENOUS

## 2018-01-16 NOTE — ED Notes (Signed)
Patient transported to Ultrasound 

## 2018-01-16 NOTE — ED Triage Notes (Addendum)
Patient complains of vaginal pain and bleeding x2 weeks. Also reports abdominal pain and loss of appetite. Patient also states she had been taking prednisone daily since 2005 for hepatitis but stopped a month ago when she ran out of all of her medications. Patient alert, oriented, and in no apparent distress at this time.  Patient also states she wants to be checked for syphilis after an unprotected sexual encounter.

## 2018-01-16 NOTE — H&P (Signed)
Date: 01/16/2018               Patient Name:  Hannah Young MRN: [id number]  DOB: 1966-05-02 Age / Sex: 52 y.o., female   PCP: Hannah Nimrod, MD         Medical Service: Internal Medicine Teaching Service         Attending Physician: Dr. Annia Belt, MD    First Contact: Dr. Vickki Young Pager: [phone]  Second Contact: Dr. Ledell Young Pager: [phone]       After Hours (After 5p/  First Contact Pager: [phone]  weekends / holidays): Second Contact Pager: [phone]   Chief Complaint: Fatigue, abdominal pain, weight loss  History of Present Illness:  Hannah Young is a 52 yo with PMH of insulin dependent T2DM, HTN, and presumed autoimmune hepatitis and cirrhosis diagnosed 2011 who presents for evaluation of fatigue, abdominal pain, and 20 lb weight loss in the month leading up to admission. History obtained directly from the patient. Patient states that for the past month she has experienced worsening epigastric pain which is sharp, crampy, worsened with eating, and sometimes radiates into her bilateral upper abdominal quadrants and throughout her lower abdomen. Patient states pain associated with nausea, non-bloody but bilious emesis, and overall decreased appetite. Her nausea/vomiting and decreased appetite have resulted in about 20 lb weight loss since the symptoms began and patient notices that her pants are fitting a little more loosely. She also notices that she feels overall worn out and that she can't walk around/complete ADL's as well 2/2 fatigue. The patient initially thought pain related to menstrual cycle, as the pain felt similar to menstrual cramps that she had experienced in the past and she had heavy vaginal bleeding while her abdominal symptoms were ocurring. The vaginal bleeding required up to 5 super maxi-pads a day and was ongoing for 15 days until it stopped. Then the patient began experiencing dysuria and yellow/white vaginal discharge, both of these symptoms  were present on admission. After her bleeding stopped the abdominal pain continued, so the patient thought her pain was a result of constipation, as the patient noticed that her stools had become harder in consistency, whiter in color, and appeared more dried out compared to previous bowel movements. No chest pain, shortness of breath, or headaches.   The patient has felt the above constellation of symptoms once before (minus the vaginal bleeding and constipation). She stated when she felt these symptoms before she was told she had a problem with her liver and hospitalized. She was following with [facility] for her liver condition and taking prednisone 30 mg and mercaptopurine 50 mg daily until about 1 month ago when she stopped taking all of her medications. She stopped taking medications because Dr. Noah Young, her PCP, no longer practices medicine and her pharmacy shut down.   Upon arrival to the ED the patient was afebrile, non-tachycardic, normotensive, and saturating >100% on room air. CMP significant for AST = 714, ALT =481, Alk Phos 135, and T Bili 5.6. Lipase = 25. Creatinine and other electrolytes within normal limits. CBC with WBC = 8.3, Hgb 11.2 (baseline 12-13), and platelets 651. PT/INR = 13.1 sec/1.00. Urinalysis with moderate bilirubin but negative for nitrites/LE. Wet prep revealed clue cells and white blood cells. CT abdomen and pelvis with contrast showed 2.3 cm endometrial clot or mass with mild bilateral hydrosalpinx. The mass was better characterized with pelvic ultrasound that demonstrated a thickened endometrium (19 mm) and focal lesion  protruding into endometrial cavity with vascular stalk, consistent with endometrial polyp or other focal lesion. Given ongoing abdominal pain and significantly elevated LFT's patient was admitted to IMTS for further management.   Meds:  Current Meds  Medication Sig  . ADVAIR DISKUS 250-50 MCG/DOSE AEPB Take 1 puff by mouth 2 (two) times daily.   .  diclofenac sodium (VOLTAREN) 1 % GEL Apply 4 g topically 4 (four) times daily.  . diphenhydrAMINE (BENADRYL) 25 MG tablet Take 1 tablet (25 mg total) by mouth every 8 (eight) hours as needed for itching.  . fluticasone (FLONASE) 50 MCG/ACT nasal spray Place 2 sprays into both nostrils daily as needed for allergies or rhinitis.   Marland Kitchen insulin NPH Human (HUMULIN N,NOVOLIN N) 100 UNIT/ML injection Inject into the skin See admin instructions. Per sliding scale  . lisinopril (PRINIVIL,ZESTRIL) 20 MG tablet Take 1 tablet (20 mg total) by mouth daily.  . mercaptopurine (PURINETHOL) 50 MG tablet Take 100 mg by mouth 2 (two) times daily. Give on an empty stomach 1 hour before or 2 hours after meals. Caution: Chemotherapy.  . metFORMIN (GLUCOPHAGE) 1000 MG tablet Take 1 tablet (1,000 mg total) by mouth 2 (two) times daily with a meal.  . Omega-3 Fatty Acids (FISH OIL PO) Take 1 capsule by mouth daily.   . traMADol (ULTRAM) 50 MG tablet Take 1 tablet (50 mg total) by mouth every 6 (six) hours as needed. (Patient taking differently: Take 50 mg by mouth every 6 (six) hours as needed for moderate pain. )  . traZODone (DESYREL) 100 MG tablet Take 150-200 mg by mouth at bedtime as needed for sleep.    Allergies: Allergies as of 01/16/2018 - Review Complete 01/16/2018  Allergen Reaction Noted  . Augmentin [amoxicillin-pot clavulanate] Anaphylaxis 02/17/2016  . Tylenol [acetaminophen] Other (See Comments) 08/19/2011  . Latex Rash 02/07/2017   Past Medical History: Past Medical History:  Diagnosis Date  . Anxiety   . Arthritis   . Asthma   . Bipolar 1 disorder ([place])   . Breast lump   . Cirrhosis ([place])   . Depression   . Diabetes mellitus   . FH: chemotherapy   . Gout   . Hepatitis B infection   . High cholesterol   . Hyperglycemia   . Hypertension   . Liver cirrhosis ([place])   . Lung nodules   . Neuropathy   . Panic disorder   . Vertigo    Family History:  Family History  Problem Relation Age of  Onset  . Colon cancer Neg Hx   . Stomach cancer Neg Hx    Social History:  Patient lives with 3 grandchildren all of whom she has custody. Previous "heavy drinker" now drinks "only socially". Would not provide details regarding previous alcohol use, only that she doesn't drink as much any more. Last drink within 1 week of admission - patient states she had about 36 ounces (or 3 beer cans full) of liquor 6 days ago because she was depressed. States she does this about 1 time each month since "quitting". Smokes intermittent cigarettes whenever she feels like it and denies current drug use.  Review of Systems: A complete ROS was negative except as per HPI.   Physical Exam: Blood pressure 136/86, pulse 80, temperature 98.7 F (37.1 C), temperature source Oral, resp. rate 17, height _0  (1.549 m), weight 196 lb (88.9 kg), SpO2 100 %.  Physical Exam  Constitutional: She is oriented to person, place, and time. She appears  well-developed and well-nourished. No distress.  HENT:  Mouth/Throat: Oropharynx is clear and moist. No oropharyngeal exudate.  Eyes: Pupils are equal, round, and reactive to light. EOM are normal. Scleral icterus (bilateral) is present.  Cardiovascular: Normal rate, regular rhythm and intact distal pulses. Exam reveals no friction rub.  No murmur heard. Respiratory: Effort normal. No respiratory distress. She has no wheezes. She has no rales.  GI: Soft. Bowel sounds are normal. She exhibits no distension. There is tenderness (Worse in epigastric region, some discomfort in RUQ). There is no rebound.  Musculoskeletal: She exhibits no edema (of bilateral lower extremities) or tenderness (of bilateral lower extremities).  Lymphadenopathy:    She has no cervical adenopathy.  Neurological: She is alert and oriented to person, place, and time.  Face strength and sensation intact bilaterally. Tongue midline. Gross motor and sensation to light touch of upper and lower extremities  intact bilaterally.   Skin: Skin is warm and dry. No rash noted. She is not diaphoretic. No erythema.   Assessment & Plan by Problem: Active Problems:   Elevated LFTs  Kellin Fifer is a 52 yo with PMH of insulin dependent T2DM, HTN, and presumed autoimmune hepatitis and cirrhosis diagnosed 2011 who presented for evaluation of abdominal pain and was found to have transaminitis and elevated bilirubin in the setting of running out of medications 1 month prior to admission. She was admitted to the internal medicine teaching service for management. The specific problems addressed during admission are as follows:  Transaminitis: Patient with elevated LFTs, T-Bili, and Alk phos on admission. Patient has history of elevated LFT's requiring hospitalization and workup in 2011. At that time patient was under the care of Dr. Watt Climes, who ultimately sent patient for biopsy which revealed chronic active hepatitis with increased liver fibrosis. The final diagnosis was autoimmune hepatitis, but there was some question of whether or not a component of alcoholic cirrhosis was contributing to patient's LFT's as patient admitted to heavy alcohol use in the years leading up to admission in 2011. Nevertheless the patient was to remain on chronic steroids with a plan to wean if patient's condition could be managed with other medications for immune-suppression. Since then it appears that patient has been lost to follow up and/or not followed with GI for further management. She has been getting steroids from PCP Dr. Oleta Mouse who recently lost his license and patient has been without medications. If patient truly has autoimmune hepatitis and has been without medications, her elevated AST/ALT, alk phose and T-Bili on admission are likely a result of flare of her condition. Doubt that alcohol contributing much to patient's clinical presentation, as she no longer reports daily alcohol use (states she quits 4 years ago). Patient  admitted to binge drinking alcohol about 1 week ago, which could have exacerbated her symptoms, but this alcohol use occurred after the onset of all her GI symptoms.  -Admit to Med-Surg -Consult GI in AM -Start prednisone 40 mg daily -Abdominal ultrasound -Trend CMP -Supportive care for symptoms of nausea (zofran), pain (toradol), and itching (benadryl)  Insulin dependent T2DM: Last A1C = 6.4% in 01/2017, suggesting good control as outpatient. It seems per chart review patient was non-diabetic prior to taking steroids. Per discussion patient takes insulin and metformin, but was not clear about dosing or how often she takes this medication when she takes it. Since she ran out 1 month ago, patient has been borrowing insulin from sister on intermittent basis. Will treat with sliding scale insulin while inpatient  to goal of <180. Patient states she would like to reestablish in [facility] on discharge since her PCP is no longer in practice. She will need metformin on discharge, but it is unclear if she will need insulin on regular basis. If patient does not require significant insulin to manage BG after reinitiating steroids, would suggest additional oral hypoglycemic agent rather than insulin for her regimen on discharge for patient ease, cost, and compliance.  -CBG monitoring TID with meals, qHS -SSI TID with meals, qHS -F/u A1C  Hypertension: Currently normotensive. On lisinopril 20 mg as outpatient, but has not taken in the past 1 month 2/2 running out of medications and being unable to refill medicines. As stated above, patient would like to reestablish in [facility] on discharge.  -Hold home lisinopril 20 mg for now -Continue to monitor  Menorrhagia: Patient with recent prolonged bleeding and workup in ED revealed possible thickened endometrium with ?endometrial polyp or other non-specific focal lesion. Additional imaging requested and possible biopsy if needed by radiologist. Patient will need further workup  after discharge for this problem.  -Referral to OBGYN as outpatient  Bacterial Vaginosis, concern for STI: Patient with recent vaginal discharge and unprotected intercourse with partner who recently tested positive for syphilis per chart review. Patient's Wet-Prep negative for trichomoniasis and most consistent with bacterial vaginosis (clue cells). Patient will be treated for this diagnosis now and will treat for other conditions which return positive after testing in ED. -Metronidazole 500 mg BID for 7 days -F/u HIV, RPR, G/C testing in AM  FEN/GI: -Carb Modified diet  -No IVF, electrolytes within normal limits  VTE Prophylaxis: Heparin TID Code Status: Partial, intubation but no chest compression  Dispo: Admit patient to Inpatient with expected length of stay greater than 2 midnights.  SignedThomasene Ripple, MD 01/16/2018, 11:58 PM  Pager: [phone]

## 2018-01-16 NOTE — Progress Notes (Signed)
Called ER RN for report. Room ready.  

## 2018-01-16 NOTE — ED Notes (Signed)
Didn't does not want family to be in room when results are given.
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
## 2018-01-17 ENCOUNTER — Inpatient Hospital Stay (HOSPITAL_COMMUNITY): Payer: Medicare Other

## 2018-01-17 ENCOUNTER — Other Ambulatory Visit: Payer: Self-pay

## 2018-01-17 DIAGNOSIS — R1010 Upper abdominal pain, unspecified: Secondary | ICD-10-CM

## 2018-01-17 DIAGNOSIS — Z91040 Latex allergy status: Secondary | ICD-10-CM

## 2018-01-17 DIAGNOSIS — R110 Nausea: Secondary | ICD-10-CM

## 2018-01-17 DIAGNOSIS — B9689 Other specified bacterial agents as the cause of diseases classified elsewhere: Secondary | ICD-10-CM

## 2018-01-17 DIAGNOSIS — N840 Polyp of corpus uteri: Secondary | ICD-10-CM

## 2018-01-17 DIAGNOSIS — Z8619 Personal history of other infectious and parasitic diseases: Secondary | ICD-10-CM

## 2018-01-17 DIAGNOSIS — E119 Type 2 diabetes mellitus without complications: Secondary | ICD-10-CM

## 2018-01-17 DIAGNOSIS — I10 Essential (primary) hypertension: Secondary | ICD-10-CM

## 2018-01-17 DIAGNOSIS — N920 Excessive and frequent menstruation with regular cycle: Secondary | ICD-10-CM

## 2018-01-17 DIAGNOSIS — R945 Abnormal results of liver function studies: Secondary | ICD-10-CM

## 2018-01-17 DIAGNOSIS — Z888 Allergy status to other drugs, medicaments and biological substances status: Secondary | ICD-10-CM

## 2018-01-17 DIAGNOSIS — N760 Acute vaginitis: Secondary | ICD-10-CM

## 2018-01-17 DIAGNOSIS — K219 Gastro-esophageal reflux disease without esophagitis: Secondary | ICD-10-CM

## 2018-01-17 DIAGNOSIS — K754 Autoimmune hepatitis: Principal | ICD-10-CM

## 2018-01-17 DIAGNOSIS — Z881 Allergy status to other antibiotic agents status: Secondary | ICD-10-CM

## 2018-01-17 DIAGNOSIS — Z794 Long term (current) use of insulin: Secondary | ICD-10-CM

## 2018-01-17 DIAGNOSIS — Z720 Tobacco use: Secondary | ICD-10-CM

## 2018-01-17 DIAGNOSIS — K2970 Gastritis, unspecified, without bleeding: Secondary | ICD-10-CM

## 2018-01-17 DIAGNOSIS — F1099 Alcohol use, unspecified with unspecified alcohol-induced disorder: Secondary | ICD-10-CM

## 2018-01-17 LAB — CBC
HEMATOCRIT: 30.5 % — AB (ref 36.0–46.0)
HEMOGLOBIN: 10 g/dL — AB (ref 12.0–15.0)
MCH: 31 pg (ref 26.0–34.0)
MCHC: 32.8 g/dL (ref 30.0–36.0)
MCV: 94.4 fL (ref 78.0–100.0)
Platelets: 538 10*3/uL — ABNORMAL HIGH (ref 150–400)
RBC: 3.23 MIL/uL — ABNORMAL LOW (ref 3.87–5.11)
RDW: 20.7 % — AB (ref 11.5–15.5)
WBC: 8.2 10*3/uL (ref 4.0–10.5)

## 2018-01-17 LAB — PROTIME-INR
INR: 1.04
Prothrombin Time: 13.5 seconds (ref 11.4–15.2)

## 2018-01-17 LAB — COMPREHENSIVE METABOLIC PANEL
ALBUMIN: 2.5 g/dL — AB (ref 3.5–5.0)
ALT: 341 U/L — ABNORMAL HIGH (ref 14–54)
AST: 520 U/L — AB (ref 15–41)
Alkaline Phosphatase: 128 U/L — ABNORMAL HIGH (ref 38–126)
Anion gap: 9 (ref 5–15)
BUN: 7 mg/dL (ref 6–20)
CHLORIDE: 104 mmol/L (ref 101–111)
CO2: 24 mmol/L (ref 22–32)
CREATININE: 0.54 mg/dL (ref 0.44–1.00)
Calcium: 8.3 mg/dL — ABNORMAL LOW (ref 8.9–10.3)
GFR calc Af Amer: >60 mL/min (ref >60)
GLUCOSE: 135 mg/dL — AB (ref 65–99)
POTASSIUM: 3.7 mmol/L (ref 3.5–5.1)
SODIUM: 137 mmol/L (ref 135–145)
Total Bilirubin: 4 mg/dL — ABNORMAL HIGH (ref 0.3–1.2)
Total Protein: 6.4 g/dL — ABNORMAL LOW (ref 6.5–8.1)

## 2018-01-17 LAB — GLUCOSE, CAPILLARY
GLUCOSE-CAPILLARY: 148 mg/dL — AB (ref 65–99)
GLUCOSE-CAPILLARY: 291 mg/dL — AB (ref 65–99)
Glucose-Capillary: 153 mg/dL — ABNORMAL HIGH (ref 65–99)
Glucose-Capillary: 250 mg/dL — ABNORMAL HIGH (ref 65–99)

## 2018-01-17 LAB — GC/CHLAMYDIA PROBE AMP (~~LOC~~) NOT AT ARMC
Chlamydia: NEGATIVE
NEISSERIA GONORRHEA: NEGATIVE

## 2018-01-17 LAB — RPR: RPR: NONREACTIVE

## 2018-01-17 LAB — APTT: APTT: 29 s (ref 24–36)

## 2018-01-17 LAB — HIV ANTIBODY (ROUTINE TESTING W REFLEX): HIV Screen 4th Generation wRfx: NONREACTIVE

## 2018-01-17 MED ORDER — INSULIN GLARGINE 100 UNIT/ML ~~LOC~~ SOLN
10.0000 [IU] | Freq: Every day | SUBCUTANEOUS | Status: DC
  Administered 2018-01-17: 10 [IU] via SUBCUTANEOUS
  Filled 2018-01-17 (×2): qty 0.1

## 2018-01-17 MED ORDER — ONDANSETRON HCL 4 MG/2ML IJ SOLN
4.0000 mg | Freq: Four times a day (QID) | INTRAMUSCULAR | Status: DC | PRN
  Administered 2018-01-18: 4 mg via INTRAVENOUS
  Filled 2018-01-17: qty 2

## 2018-01-17 MED ORDER — PREDNISONE 20 MG PO TABS
40.0000 mg | ORAL_TABLET | Freq: Every day | ORAL | Status: DC
  Administered 2018-01-17 – 2018-01-18 (×2): 40 mg via ORAL
  Filled 2018-01-17 (×2): qty 2

## 2018-01-17 MED ORDER — METRONIDAZOLE 500 MG PO TABS
500.0000 mg | ORAL_TABLET | Freq: Two times a day (BID) | ORAL | Status: DC
  Administered 2018-01-17 – 2018-01-18 (×4): 500 mg via ORAL
  Filled 2018-01-17 (×4): qty 1

## 2018-01-17 MED ORDER — DIPHENHYDRAMINE HCL 25 MG PO CAPS
25.0000 mg | ORAL_CAPSULE | Freq: Three times a day (TID) | ORAL | Status: DC | PRN
  Administered 2018-01-17 (×2): 25 mg via ORAL
  Filled 2018-01-17 (×2): qty 1

## 2018-01-17 MED ORDER — ALBUTEROL SULFATE (2.5 MG/3ML) 0.083% IN NEBU: 3.0000 mL | INHALATION_SOLUTION | Freq: Four times a day (QID) | RESPIRATORY_TRACT | Status: DC | PRN

## 2018-01-17 MED ORDER — MOMETASONE FURO-FORMOTEROL FUM 200-5 MCG/ACT IN AERO
2.0000 | INHALATION_SPRAY | Freq: Two times a day (BID) | RESPIRATORY_TRACT | Status: DC
  Administered 2018-01-17: 2 via RESPIRATORY_TRACT
  Filled 2018-01-17: qty 8.8

## 2018-01-17 MED ORDER — INSULIN ASPART 100 UNIT/ML ~~LOC~~ SOLN: 0.0000 [IU] | Freq: Three times a day (TID) | SUBCUTANEOUS | Status: DC

## 2018-01-17 MED ORDER — KETOROLAC TROMETHAMINE 15 MG/ML IJ SOLN
15.0000 mg | Freq: Four times a day (QID) | INTRAMUSCULAR | Status: DC | PRN
  Administered 2018-01-17: 15 mg via INTRAVENOUS
  Filled 2018-01-17: qty 1

## 2018-01-17 MED ORDER — PANTOPRAZOLE SODIUM 40 MG PO TBEC
40.0000 mg | DELAYED_RELEASE_TABLET | Freq: Every day | ORAL | Status: DC
  Administered 2018-01-17 – 2018-01-18 (×2): 40 mg via ORAL
  Filled 2018-01-17 (×2): qty 1

## 2018-01-17 MED ORDER — HEPARIN SODIUM (PORCINE) 5000 UNIT/ML IJ SOLN
5000.0000 [IU] | Freq: Three times a day (TID) | INTRAMUSCULAR | Status: DC
  Administered 2018-01-17 – 2018-01-18 (×3): 5000 [IU] via SUBCUTANEOUS
  Filled 2018-01-17 (×4): qty 1

## 2018-01-17 MED ORDER — INSULIN ASPART 100 UNIT/ML ~~LOC~~ SOLN
0.0000 [IU] | Freq: Every day | SUBCUTANEOUS | Status: DC
  Administered 2018-01-17: 3 [IU] via SUBCUTANEOUS

## 2018-01-17 MED ORDER — ONDANSETRON HCL 4 MG PO TABS: 4.0000 mg | ORAL_TABLET | Freq: Four times a day (QID) | ORAL | Status: DC | PRN

## 2018-01-17 MED ORDER — INSULIN ASPART 100 UNIT/ML ~~LOC~~ SOLN
0.0000 [IU] | Freq: Every day | SUBCUTANEOUS | Status: DC
Start: 2018-01-17 — End: 2018-01-17

## 2018-01-17 MED ORDER — OXYCODONE HCL 5 MG PO TABS
5.0000 mg | ORAL_TABLET | Freq: Two times a day (BID) | ORAL | Status: DC | PRN
  Administered 2018-01-17: 5 mg via ORAL
  Filled 2018-01-17: qty 1

## 2018-01-17 MED ORDER — SODIUM CHLORIDE 0.9% FLUSH
3.0000 mL | Freq: Two times a day (BID) | INTRAVENOUS | Status: DC
  Administered 2018-01-17 – 2018-01-18 (×3): 3 mL via INTRAVENOUS

## 2018-01-17 MED ORDER — INSULIN ASPART 100 UNIT/ML ~~LOC~~ SOLN
0.0000 [IU] | Freq: Three times a day (TID) | SUBCUTANEOUS | Status: DC
  Administered 2018-01-17: 3 [IU] via SUBCUTANEOUS
  Administered 2018-01-17: 11 [IU] via SUBCUTANEOUS
  Administered 2018-01-17: 4 [IU] via SUBCUTANEOUS
  Administered 2018-01-18: 7 [IU] via SUBCUTANEOUS
  Administered 2018-01-18: 4 [IU] via SUBCUTANEOUS

## 2018-01-17 NOTE — Progress Notes (Signed)
Subjective: Ms. Osterloh was lying in bed comfortably. She says that her abdominal pain is better this morning, but still tender to palpation.  Objective: Vital signs in last 24 hours: Vitals:   01/16/18 2335 01/16/18 2354 01/17/18 0544 01/17/18 0905  BP: 117/79 136/86 130/85 110/86  Pulse: 80 80 67 76  Resp: 16 17 16 16   Temp: 98.3 F (36.8 C) 98.7 F (37.1 C) 98.7 F (37.1 C) 98 F (36.7 C)  TempSrc: Oral Oral Oral Oral  SpO2: 100% 100% 98% 99%  Weight:  88.9 kg (196 lb)    Height:  5' 1"  (1.549 m)     Weight change:   Intake/Output Summary (Last 24 hours) at 01/17/2018 1313 Last data filed at 01/17/2018 0900 Gross per 24 hour  Intake 1780 ml  Output 0 ml  Net 1780 ml   General appearance: Obese woman lying comfortably in bed in no apparent distress Lungs: clear to auscultation bilaterally Heart: regular rate and rhythm, S1, S2 normal, no murmur, click, rub or gallop Abdomen: soft, tender to palpation in the epigastric region Extremities: extremities normal, atraumatic, no cyanosis or edema Pulses: 2+ and symmetric Neurologic: Grossly normal Lab Results: @LABTEST2 @ Micro Results: Recent Results (from the past 240 hour(s))  Wet prep, genital     Status: Abnormal   Collection Time: 01/16/18  5:45 PM  Result Value Ref Range Status   Yeast Wet Prep HPF POC NONE SEEN NONE SEEN Final   Trich, Wet Prep NONE SEEN NONE SEEN Final   Clue Cells Wet Prep HPF POC PRESENT (A) NONE SEEN Final   WBC, Wet Prep HPF POC FEW (A) NONE SEEN Final   Sperm NONE SEEN  Final    Comment: Performed at [facility], [address], [place], [place] [address]   Studies/Results: US Abdomen Complete  Result Date: 01/17/2018 CLINICAL DATA:  Weight loss. Nausea, vomiting 3 x today. Abdominal pain. History of hepatitis-B, cirrhosis, hypertension, diabetes. EXAM: ABDOMEN ULTRASOUND COMPLETE COMPARISON:  CT of the abdomen and pelvis on 01/16/2018 FINDINGS: Gallbladder: Gallbladder has a normal  appearance. Gallbladder wall is 2.8 millimeters, within normal limits. No stones or pericholecystic fluid. No sonographic Murphy's sign. Common bile duct: Diameter: 1.9 millimeters Liver: No focal lesion identified. Within normal limits in parenchymal echogenicity. Portal vein is patent on color Doppler imaging with normal direction of blood flow towards the liver. IVC: No abnormality visualized. Pancreas: Visualized portion unremarkable. Spleen: Size and appearance within normal limits. Right Kidney: Length: 11.5. Echogenicity within normal limits. No mass or hydronephrosis visualized. Left Kidney: Length: 11.1. Echogenicity within normal limits. No mass or hydronephrosis visualized. Abdominal aorta: No aneurysm visualized. Other findings: None. IMPRESSION: No acute abnormality of the abdomen. Electronically Signed   By: Nolon Nations M.D.   On: 01/17/2018 09:40   US Transvaginal Non-ob  Result Date: 01/16/2018 CLINICAL DATA:  Initial evaluation for endometrial lesion, suprapubic pain. EXAM: TRANSABDOMINAL AND TRANSVAGINAL ULTRASOUND OF PELVIS TECHNIQUE: Both transabdominal and transvaginal ultrasound examinations of the pelvis were performed. Transabdominal technique was performed for global imaging of the pelvis including uterus, ovaries, adnexal regions, and pelvic cul-de-sac. It was necessary to proceed with endovaginal exam following the transabdominal exam to visualize the uterus, endometrium, and ovaries. COMPARISON:  Prior CT from earlier the same day. FINDINGS: Uterus Measurements: 9.0 x 4.4 x 5.0 cm. No fibroids or other mass visualized. Endometrium Thickness: 18.8 mm. There is question of a focal lesion protruding into the endometrial lumen with vascular stalk, which could reflect  endometrial polyp. This is poorly defined on this exam, but suspected given findings on previous CT. Possible vascular stalk noted on image 53. Right ovary Measurements: 3.1 x 1.5 x 2.3 cm. 1.4 x 1.3 x 0.9 cm simple  paraovarian cyst noted, of doubtful significance in this premenopausal patient. Left ovary Measurements: 2.4 x 1.1 x 1.4 cm. Normal appearance/no adnexal mass. Other findings No abnormal free fluid. IMPRESSION: 1. Thickened endometrium measuring up to 19 mm. Question of focal lesion protruding into the endometrial cavity with associated vascular stalk. In conjunction with prior CT, findings are suspicious for possible endometrial polyp or other lesion. Follow-up examination with sonohysterogram recommended for further evaluation prior to hysteroscopy or endometrial biopsy. 2. 1.4 cm simple right paraovarian cyst, of doubtful significance in this pre menopausal patient. 3. No other acute abnormality within the pelvis. Electronically Signed   By: Jeannine Boga M.D.   On: 01/16/2018 22:17   US Pelvis Complete  Result Date: 01/16/2018 CLINICAL DATA:  Initial evaluation for endometrial lesion, suprapubic pain. EXAM: TRANSABDOMINAL AND TRANSVAGINAL ULTRASOUND OF PELVIS TECHNIQUE: Both transabdominal and transvaginal ultrasound examinations of the pelvis were performed. Transabdominal technique was performed for global imaging of the pelvis including uterus, ovaries, adnexal regions, and pelvic cul-de-sac. It was necessary to proceed with endovaginal exam following the transabdominal exam to visualize the uterus, endometrium, and ovaries. COMPARISON:  Prior CT from earlier the same day. FINDINGS: Uterus Measurements: 9.0 x 4.4 x 5.0 cm. No fibroids or other mass visualized. Endometrium Thickness: 18.8 mm. There is question of a focal lesion protruding into the endometrial lumen with vascular stalk, which could reflect endometrial polyp. This is poorly defined on this exam, but suspected given findings on previous CT. Possible vascular stalk noted on image 53. Right ovary Measurements: 3.1 x 1.5 x 2.3 cm. 1.4 x 1.3 x 0.9 cm simple paraovarian cyst noted, of doubtful significance in this premenopausal patient. Left  ovary Measurements: 2.4 x 1.1 x 1.4 cm. Normal appearance/no adnexal mass. Other findings No abnormal free fluid. IMPRESSION: 1. Thickened endometrium measuring up to 19 mm. Question of focal lesion protruding into the endometrial cavity with associated vascular stalk. In conjunction with prior CT, findings are suspicious for possible endometrial polyp or other lesion. Follow-up examination with sonohysterogram recommended for further evaluation prior to hysteroscopy or endometrial biopsy. 2. 1.4 cm simple right paraovarian cyst, of doubtful significance in this pre menopausal patient. 3. No other acute abnormality within the pelvis. Electronically Signed   By: Jeannine Boga M.D.   On: 01/16/2018 22:17   Ct Abdomen Pelvis W Contrast  Result Date: 01/16/2018 CLINICAL DATA:  Unintended weight loss, nausea and vomiting. Vaginal bleeding for 2 weeks. On prednisone for hepatitis, stopped 1 month ago. History of laparoscopic surgery, cirrhosis. EXAM: CT ABDOMEN AND PELVIS WITH CONTRAST TECHNIQUE: Multidetector CT imaging of the abdomen and pelvis was performed using the standard protocol following bolus administration of intravenous contrast. CONTRAST:  167m OMNIPAQUE IOHEXOL 300 MG/ML  SOLN COMPARISON:  CT abdomen and pelvis November 17, 2010 FINDINGS: LOWER CHEST: Lung bases are clear. Included heart size is normal. No pericardial effusion. HEPATOBILIARY: Liver and gallbladder are normal. PANCREAS: Normal. SPLEEN: Normal. ADRENALS/URINARY TRACT: Kidneys are orthotopic, demonstrating symmetric enhancement. No nephrolithiasis, hydronephrosis or solid renal masses. The unopacified ureters are normal in course and caliber. Delayed imaging through the kidneys demonstrates symmetric prompt contrast excretion within the proximal urinary collecting system. Urinary bladder is partially distended and unremarkable. Normal adrenal glands. STOMACH/BOWEL: The stomach, small and large  bowel are normal in course and caliber  without inflammatory changes. Moderate colonic diverticulosis. Normal appendix. VASCULAR/LYMPHATIC: Aortoiliac vessels are normal in course and caliber. Mild calcific atherosclerosis. No lymphadenopathy by CT size criteria. REPRODUCTIVE: 2.3 cm ovoid hyperdensity within the endometrium. Subcentimeter tubular hypodense paraovarian structures. OTHER: No intraperitoneal free fluid or free air. MUSCULOSKELETAL: Nonacute. Mild L5-S1 spondylosis. Moderate lumbar spondylosis. Mild bilateral hip osteoarthrosis. IMPRESSION: 1. 2.3 cm endometrial clot or mass. Mild suspected bilateral hydrosalpinx. Recommend pelvic ultrasound. 2. Moderate colonic diverticulosis without acute diverticulitis. Aortic Atherosclerosis (ICD10-I70.0). Electronically Signed   By: Elon Alas M.D.   On: 01/16/2018 18:50   Medications: I have reviewed the patient's current medications. Scheduled Meds: . heparin  5,000 Units Subcutaneous Q8H  . insulin aspart  0-20 Units Subcutaneous TID WC  . insulin aspart  0-5 Units Subcutaneous QHS  . metroNIDAZOLE  500 mg Oral Q12H  . mometasone-formoterol  2 puff Inhalation BID  . pantoprazole  40 mg Oral Daily  . predniSONE  40 mg Oral Q breakfast  . sodium chloride flush  3 mL Intravenous Q12H   Continuous Infusions: PRN Meds:.albuterol, diphenhydrAMINE, ketorolac, ondansetron **OR** ondansetron (ZOFRAN) IV Assessment/Plan: Principal Problem:   Elevated LFTs Active Problems:   Autoimmune hepatitis ([place])   Diabetes mellitus ([place])   Asthma   Hypertension   Vaginal discharge  Dakayla Disanti is a 52 year old female with history of insulin dependent type II DM, hypertension, and autoimmune hepatitis and cirrhosis diagnosed in 2011 who was admitted for elevated transaminases and bilirubin thought to be secondary to running out of her medications ~1 month ago.   Transaminitis: Elevated LFTS, T-bili, Alk Phos on admission. On repeat BMP  they had come down AST 714>>520; ALT 481>>341;  T-bili 5.6>>4.0; Alk Phos 135>>128.  In 2011 she had a similar presentation and required hospitalization. She was diagnosed with autoimmune hepatis and started on steroids and 6-mercaptopurine, and lost to follow up by GI. Her prescriptions have been refilled since by her PCP, who recently lost his medical license. She has been without her medications since. Per their note, GI thinks that alcohol use could be playing a role, but her transaminase levels are elevated beyond what is typically expected in alcoholic hepatitis. CT and [place] do not demonstrate cirrhosis. Hepatitis panels pending.   -prednisone 31m daily -zofran for nausea -toradol for pain -benadryl for itching -CMP  Insulin dependent type II DM Her last A1c in 2018 was 6.4%. Her blood glucose has been 162, 153, and 148 in the hospital. Diabetes presentation coincided with her starting prednisone for autoimmune hepatitis, suggesting steroid induced hyperglycemia. Will continue to monitor blood glucose levels now that prednisone has been restarted -CBG monitoring TID w/meals, qHS -SSI TID w/meals, qHS -A1c  Hypertension: Patient has been normotensive since admission.  -holding home lisinopril 234m[facility]  [place] bleeding prior to admission that has since ceased. Imaging showed possible thickened endometrium with non-specific focal lesion.  -outpatient referral to OBGYN   Bacterial Vaginosis: -Metronidazole 500 mg BID for 7 days -HIV, RPR, GC/Chlamydia pending  FEN/GI: -Carb Modified diet  -No IVF, electrolytes within normal limits  VTE Prophylaxis: Heparin TID Code Status: Partial, intubation but no chest compression  This is a MeCareers information officerote.  The care of the patient was discussed with Dr. GrBeryle Beamsnd the assessment and plan formulated with their assistance.  Please see their attached note for official documentation of the daily encounter.  LOS: 1  day   Verdis Frederickson, Medical Student 01/17/2018, 1:13 PM

## 2018-01-17 NOTE — Progress Notes (Addendum)
Subjective: Pt reports improvement in abdominal pain from day prior.  Does not report nausea vomiting or diarrhea this am.    Objective:  Vital signs in last 24 hours: Vitals:   01/16/18 2354 01/17/18 0544 01/17/18 0905 01/17/18 1657  BP: 136/86 130/85 110/86 117/81  Pulse: 80 67 76 75  Resp: 17 16 16 16   Temp: 98.7 F (37.1 C) 98.7 F (37.1 C) 98 F (36.7 C) 98.7 F (37.1 C)  TempSrc: Oral Oral Oral Oral  SpO2: 100% 98% 99% 100%  Weight: 196 lb (88.9 kg)     Height: 5' 1"  (1.549 m)      Physical Exam  Constitutional: No distress.  Eyes: Scleral icterus is present.  Cardiovascular: Normal rate, regular rhythm and normal heart sounds. Exam reveals no gallop and no friction rub.  No murmur heard. Pulmonary/Chest: Effort normal and breath sounds normal. No respiratory distress. She has no wheezes. She has no rales. She exhibits no tenderness.  Abdominal: Soft. Bowel sounds are normal. She exhibits no distension and no mass. There is tenderness (epigastric>RUQ). There is no rebound and no guarding.  Musculoskeletal: She exhibits no edema or deformity.  Neurological: She is alert.  Skin: She is not diaphoretic.    Assessment/Plan:  Principal Problem:   Elevated LFTs Active Problems:   Autoimmune hepatitis ([place])   Diabetes mellitus ([place])   Asthma   Hypertension   Vaginal discharge   BV (bacterial vaginosis)  Acute hepatitis: Patient with elevated LFTs, T-Bili, and Alk phos on admission. Patient has history of elevated LFT's requiring hospitalization and workup in 2011. At that time patient was under the care of Dr. Watt Climes, who ultimately sent patient for biopsy which revealed chronic active hepatitis with increased liver fibrosis. The final diagnosis was autoimmune hepatitis, but there was some question of whether or not a component of alcoholic cirrhosis was contributing to patient's LFT's as patient admitted to heavy alcohol use in the years leading up to admission in 2011.  There is still concern for alcohol use but it is uncertain how much this is contributing.    -Admit to Med-Surg -GI following -AIH labs ordered, viral hepatitis panel ordered -prednisone 40 mg daily -Trend CMP -Supportive care for symptoms of nausea (zofran), pain (toradol), and itching (benadryl)   Insulin dependent T2DM: Last A1C = 6.4% in 01/2017, suggesting good control as outpatient. Ran out of meds one month ago  -CBG monitoring TID with meals, qHS -SSI TID with meals, qHS, lantus 10 units QHS -A1C in am    Hypertension: Currently normotensive. On lisinopril 20 mg as outpatient, but has not taken in the past 1 month 2/2 running out of medications and being unable to refill medicines. As stated above, patient would like to reestablish in [facility] on discharge.   -Hold home lisinopril 20 mg for now, pt normotensive -Continue to monitor   Menorrhagia: Patient with recent prolonged bleeding and workup in ED revealed possible thickened endometrium with ?endometrial polyp or other non-specific focal lesion. Additional imaging requested and possible biopsy if needed by radiologist. Patient will need further workup after discharge for this problem.   -Referral to OBGYN as outpatient   Bacterial Vaginosis, concern for STI: Patient with recent vaginal discharge and unprotected intercourse with partner who recently tested positive for syphilis per chart review. Patient's Wet-Prep negative for trichomoniasis and most consistent with bacterial vaginosis (clue cells). Patient will be treated for this diagnosis now and will treat for other conditions which return positive after testing  in ED. RPR negative, GC/chlamydia neg  -Metronidazole 500 mg BID for 7 days    Dispo: Anticipated discharge in approximately 2-3 day(s).   Hannah Roan, MD 01/17/2018, 5:29 PM Hannah Muff MD PGY-1 Internal Medicine Pager # [phone]

## 2018-01-17 NOTE — Progress Notes (Signed)
New Admission Note:  Arrival Method: Stretcher  Mental Orientation: Alert and oriented x 4 Telemetry: N/A Assessment: Completed Skin: Warm and d ry  IV: NSL  Pain: Denies  Tubes: N/A Safety Measures: Safety Fall Prevention Plan initiated.  Admission: Completed 5 M  Orientation: Patient has been orientated to the room, unit and the staff. Family: None Orders have been reviewed and implemented. Will continue to monitor the patient. Call light has been placed within reach and bed alarm has been activated.   [place] ShiEmmanuel Kendyll Huettner BSN, RN  Phone Number: [phone]

## 2018-01-17 NOTE — Consult Note (Addendum)
[facility] Consult: 8:07 AM 01/17/2018  LOS: 1 day    Referring Provider: Dr Beryle Beams  Primary Care Physician:  [name], MD previously Tillman Sers MD Primary Gastroenterologist:  Althia Forts pt.  Discharged from [employer].  She cancelled/no slowed for appts and was actually never seen by a GI doc.   Remotely follwed by Dr Clarene Essex.      Reason for Consultation: Jaundice, fatigue and patient with autoimmune hepatitis.   HPI: Hannah Young is a 52 y.o. female.  Hx DM type 2 requires insulin.  HTN.  Bipolar, panic d/o.  Neuropathy.  Asthma.  Lung nodules. Osteoarthritis.  Obesity.  Syphillis + 02/2017, treated with doxycycline.   Ulcers.  Denies previous upper endoscopy or colonoscopy.  Diagnosis of AIH as of 2011.  ? Cirrhosis.  HBV dx noted but in 11/2009 had negative Hep B surface Ag, Hep B Core Ab and HCV.  Liver biopsy 2011 with chronic active hepatitis, grade 3 fibrosis, increased fibrosis, Ddx included AIH.  In 2011 IgG and IgA were elevated.  ANA, smooth muscle Ab IgG were negative.  Ferritin normal.  EBV VCA IgG elevated.   Meds for AIH include Mercaptopurine and prednisone.  However it has been one, possibly 2 or more months since she took prednisone.  She has been spreading out the dosing of mercaptopurine and taking it perhaps every other day, fine to make it last until she can be seen by her new PMD and get new prescriptions.  She has fairly chronic abdominal discomfort, heartburn, nausea, bilious and nonbloody emesis.  Symptoms are especially noticeable in the morning.  The discomfort is mostly in her upper abdomen bilaterally, somewhat worse after eating.  She says she takes an acid controlling medication but nothing corresponding with that listed on her home med list.  She is lost weight.   She used to weight in the 240s, currently weighs 196#.   Progressive fatigue for several weeks.  Has noticed jaundice in the last several days.. Menses are irregular.  Recently she had 15 days of daily, heavy vaginal bleeding.    Has significant social stressors taking care of 3 grandchildren.  Her daughter, and alcoholic, is unable to take care of them and has even asked her mother to take on the care of her fourth child.  For the most part she has quit alcohol for several years but a few weeks ago she admits to drinking 2 or 3 beers when she was quite upset.  She is coming up on an anniversary of the witnessed shooting death of her fianc.  A year ago in 2022-09-29 her ex-husband passed away, although they were divorced he was helpful and they were close.  T bili 5.6 >> 4.  Alk phos 135 >> 128.  AST/ALT 714/481 >> 520/341.   Hgb 11.2  >> 10.  MCV normal.  Platelets, PT/INR normal.  Albumin 2.5.  Ammonia 61.   CT abdomen/pelvis with contrast:  Endometrial mass vs clot.  Suspect bil mild hydrosalpinx.  Colon diverticulosis without diverticulitis.  Liver and  GB normal.   Abdominal ultrasound shows no abnormalities.  Gallbladder, biliary tree, liver all look normal.   Pelvic US: shows thickened endometrium findings suspicious for possible Endometrial polyp or other lesion.    Past Medical History:  Diagnosis Date  . Anxiety   . Arthritis   . Asthma   . Bipolar 1 disorder ([place])   . Breast lump   . Cirrhosis ([place])   . Depression   . Diabetes mellitus   . FH: chemotherapy   . Gout   . Hepatitis B infection   . High cholesterol   . Hyperglycemia   . Hypertension   . Liver cirrhosis ([place])   . Lung nodules   . Neuropathy   . Panic disorder   . Vertigo     Past Surgical History:  Procedure Laterality Date  . Biopsy of liver    . CESAREAN SECTION    . LAPAROSCOPY    . TUBAL LIGATION      Prior to Admission medications   Medication Sig Start Date End Date Taking? Authorizing Provider    ADVAIR DISKUS 250-50 MCG/DOSE AEPB Take 1 puff by mouth 2 (two) times daily.  12/19/16  Yes [provider]  diclofenac sodium (VOLTAREN) 1 % GEL Apply 4 g topically 4 (four) times daily. 12/20/16  Yes Velna Ochs, MD  diphenhydrAMINE (BENADRYL) 25 MG tablet Take 1 tablet (25 mg total) by mouth every 8 (eight) hours as needed for itching. 01/24/17  Yes [name], MD  fluticasone (FLONASE) 50 MCG/ACT nasal spray Place 2 sprays into both nostrils daily as needed for allergies or rhinitis.    Yes [provider]  insulin NPH Human (HUMULIN N,NOVOLIN N) 100 UNIT/ML injection Inject into the skin See admin instructions. Per sliding scale   Yes [provider]  mercaptopurine (PURINETHOL) 50 MG tablet Take 100 mg by mouth 2 (two) times daily. Give on an empty stomach 1 hour before or 2 hours after meals. Caution: Chemotherapy.   Yes [provider]  metFORMIN (GLUCOPHAGE) 1000 MG tablet Take 1 tablet (1,000 mg total) by mouth 2 (two) times daily with a meal. 01/24/17  Yes [name], MD  traZODone (DESYREL) 100 MG tablet Take 150-200 mg by mouth at bedtime as needed for sleep.    Yes [provider]  VENTOLIN HFA 108 (90 Base) MCG/ACT inhaler Inhale 2 puffs into the lungs every 4 (four) hours as needed for wheezing or shortness of breath. 200/12=17 11/09/17  Yes [name], MD    Scheduled Meds: . heparin  5,000 Units Subcutaneous Q8H  . insulin aspart  0-20 Units Subcutaneous TID WC  . insulin aspart  0-5 Units Subcutaneous QHS  . metroNIDAZOLE  500 mg Oral Q12H  . mometasone-formoterol  2 puff Inhalation BID  . pantoprazole  40 mg Oral Daily  . predniSONE  40 mg Oral Q breakfast  . sodium chloride flush  3 mL Intravenous Q12H   Infusions:  PRN Meds: albuterol, diphenhydrAMINE, ketorolac, ondansetron **OR** ondansetron (ZOFRAN) IV   Allergies as of 01/16/2018 - Review Complete 01/16/2018  Allergen Reaction Noted  . Augmentin  [amoxicillin-pot clavulanate] Anaphylaxis 02/17/2016  . Tylenol [acetaminophen] Other (See Comments) 08/19/2011  . Latex Rash 02/07/2017    Family History  Problem Relation Age of Onset  . Colon cancer Neg Hx   . Stomach cancer Neg Hx     Social History   Socioeconomic History  . Marital status: Legally Separated    Spouse name: Not on  file  . Number of children: Not on file  . Years of education: Not on file  . Highest education level: Not on file  Occupational History  . Not on file  Social Needs  . Financial resource strain: Not on file  . Food insecurity:    Worry: Not on file    Inability: Not on file  . Transportation needs:    Medical: Not on file    Non-medical: Not on file  Tobacco Use  . Smoking status: Current Every Day Smoker    Packs/day: 0.25    Types: Cigarettes  . Smokeless tobacco: Never Used  . Tobacco comment: 4 cigs  per day  Substance and Sexual Activity  . Alcohol use: No    Alcohol/week: 1.8 oz    Types: 3 Cans of beer per week    Comment: ocassional  . Drug use: No  . Sexual activity: Yes    Birth control/protection: Surgical  Lifestyle  . Physical activity:    Days per week: Not on file    Minutes per session: Not on file  . Stress: Not on file  Relationships  . Social connections:    Talks on phone: Not on file    Gets together: Not on file    Attends religious service: Not on file    Active member of club or organization: Not on file    Attends meetings of clubs or organizations: Not on file    Relationship status: Not on file  . Intimate partner violence:    Fear of current or ex partner: Not on file    Emotionally abused: Not on file    Physically abused: Not on file    Forced sexual activity: Not on file  Other Topics Concern  . Not on file  Social History Narrative  . Not on file    REVIEW OF SYSTEMS: Constitutional: Fatigue, weakness.  No falls. ENT:  No nose bleeds Pulm: Some SOB with prolonged moderate to heavy  exertion or climbing a lot of stairs. CV:  No palpitations, no LE edema.  No chest pain GU:  No hematuria, no frequency GI:  Per HPI Heme: Other than the menorrhagia described above, she has not had any unusual bleeding or bruising. Transfusions: She received transfusions at age 70 associated with heavy uterine bleeding. Neuro:  No headaches, no peripheral tingling or numbness.  Syncope. Derm:  No itching, no rash or sores.  Endocrine:  No sweats or chills.  No polyuria or dysuria Immunization: Not querried Travel:  None beyond local counties in last few months.    PHYSICAL EXAM: Vital signs in last 24 hours: Vitals:   01/16/18 2354 01/17/18 0544  BP: 136/86 130/85  Pulse: 80 67  Resp: 17 16  Temp: 98.7 F (37.1 C) 98.7 F (37.1 C)  SpO2: 100% 98%   Wt Readings from Last 3 Encounters:  01/16/18 196 lb (88.9 kg)  02/12/17 210 lb (95.3 kg)  02/07/17 210 lb (95.3 kg)    General: Patient looks moderately chronically ill.  Comfortable.  Alert. Head: No facial asymmetry or swelling.  No signs of head trauma. Eyes: No conjunctival pallor.  Scleral icterus. Ears: Not hard of hearing. Nose: Congestion or discharge. Mouth: Moist, clear, pink oral mucosa.  Tongue midline. Neck: No JVD, no masses, no thyromegaly. Lungs: Clear bilaterally.  No labored breathing or cough. Heart: RRR.  No MRG.  S1, S2 present. Abdomen: Soft.  Slightly tender in the upper abdomen without guarding or  rebound.  No HSM, masses, bruits, hernias.  Active bowel sounds..   Rectal: Deferred. Musc/Skeltl: No joint deformity or swelling. Extremities: No CCE. Neurologic: Oriented x3.  Alert.  No tremor.  No asterixis.  No gross deficits. Conversational with fluent speech. Skin: No suspicious lesions or rash. Tattoos: Yes Nodes: No cervical adenopathy. Psych: Somewhat flat and depressed affect but engaged in conversation and able to provide good history.  Intake/Output from previous day: 06/05 0701 - 06/06  0700 In: 1240 [P.O.:240; IV Piggyback:1000] Out: 0  Intake/Output this shift: No intake/output data recorded.  LAB RESULTS: Recent Labs    01/16/18 1150 01/17/18 0450  WBC 8.3 8.2  HGB 11.2* 10.0*  HCT 34.3* 30.5*  PLT 651* 538*   BMET Lab Results  Component Value Date   NA 137 01/17/2018   NA 138 01/16/2018   NA 135 07/11/2016   K 3.7 01/17/2018   K 4.0 01/16/2018   K 4.1 07/11/2016   CL 104 01/17/2018   CL 105 01/16/2018   CL 100 (L) 07/11/2016   CO2 24 01/17/2018   CO2 25 01/16/2018   CO2 26 07/11/2016   GLUCOSE 135 (H) 01/17/2018   GLUCOSE 117 (H) 01/16/2018   GLUCOSE 374 (H) 07/11/2016   BUN 7 01/17/2018   BUN 6 01/16/2018   BUN 14 07/11/2016   CREATININE 0.54 01/17/2018   CREATININE 0.48 01/16/2018   CREATININE 0.83 07/11/2016   CALCIUM 8.3 (L) 01/17/2018   CALCIUM 8.8 (L) 01/16/2018   CALCIUM 9.7 07/11/2016   LFT Recent Labs    01/16/18 1150 01/17/18 0450  PROT 8.1 6.4*  ALBUMIN 3.2* 2.5*  AST 714* 520*  ALT 481* 341*  ALKPHOS 135* 128*  BILITOT 5.6* 4.0*   PT/INR Lab Results  Component Value Date   INR 1.04 01/17/2018   INR 1.00 01/16/2018   INR 0.96 12/17/2009   Hepatitis Panel No results for input(s): HEPBSAG, HCVAB, HEPAIGM, HEPBIGM in the last 72 hours. C-Diff No components found for: CDIFF Lipase     Component Value Date/Time   LIPASE 23 01/16/2018 1150    Drugs of Abuse  No results found for: LABOPIA, COCAINSCRNUR, LABBENZ, AMPHETMU, THCU, LABBARB   RADIOLOGY STUDIES: US Transvaginal Non-ob  Result Date: 01/16/2018 CLINICAL DATA:  Initial evaluation for endometrial lesion, suprapubic pain. EXAM: TRANSABDOMINAL AND TRANSVAGINAL ULTRASOUND OF PELVIS TECHNIQUE: Both transabdominal and transvaginal ultrasound examinations of the pelvis were performed. Transabdominal technique was performed for global imaging of the pelvis including uterus, ovaries, adnexal regions, and pelvic cul-de-sac. It was necessary to proceed with  endovaginal exam following the transabdominal exam to visualize the uterus, endometrium, and ovaries. COMPARISON:  Prior CT from earlier the same day. FINDINGS: Uterus Measurements: 9.0 x 4.4 x 5.0 cm. No fibroids or other mass visualized. Endometrium Thickness: 18.8 mm. There is question of a focal lesion protruding into the endometrial lumen with vascular stalk, which could reflect endometrial polyp. This is poorly defined on this exam, but suspected given findings on previous CT. Possible vascular stalk noted on image 53. Right ovary Measurements: 3.1 x 1.5 x 2.3 cm. 1.4 x 1.3 x 0.9 cm simple paraovarian cyst noted, of doubtful significance in this premenopausal patient. Left ovary Measurements: 2.4 x 1.1 x 1.4 cm. Normal appearance/no adnexal mass. Other findings No abnormal free fluid. IMPRESSION: 1. Thickened endometrium measuring up to 19 mm. Question of focal lesion protruding into the endometrial cavity with associated vascular stalk. In conjunction with prior CT, findings are suspicious for possible endometrial polyp  or other lesion. Follow-up examination with sonohysterogram recommended for further evaluation prior to hysteroscopy or endometrial biopsy. 2. 1.4 cm simple right paraovarian cyst, of doubtful significance in this pre menopausal patient. 3. No other acute abnormality within the pelvis. Electronically Signed   By: Jeannine Boga M.D.   On: 01/16/2018 22:17   US Pelvis Complete  Result Date: 01/16/2018 CLINICAL DATA:  Initial evaluation for endometrial lesion, suprapubic pain. EXAM: TRANSABDOMINAL AND TRANSVAGINAL ULTRASOUND OF PELVIS TECHNIQUE: Both transabdominal and transvaginal ultrasound examinations of the pelvis were performed. Transabdominal technique was performed for global imaging of the pelvis including uterus, ovaries, adnexal regions, and pelvic cul-de-sac. It was necessary to proceed with endovaginal exam following the transabdominal exam to visualize the uterus,  endometrium, and ovaries. COMPARISON:  Prior CT from earlier the same day. FINDINGS: Uterus Measurements: 9.0 x 4.4 x 5.0 cm. No fibroids or other mass visualized. Endometrium Thickness: 18.8 mm. There is question of a focal lesion protruding into the endometrial lumen with vascular stalk, which could reflect endometrial polyp. This is poorly defined on this exam, but suspected given findings on previous CT. Possible vascular stalk noted on image 53. Right ovary Measurements: 3.1 x 1.5 x 2.3 cm. 1.4 x 1.3 x 0.9 cm simple paraovarian cyst noted, of doubtful significance in this premenopausal patient. Left ovary Measurements: 2.4 x 1.1 x 1.4 cm. Normal appearance/no adnexal mass. Other findings No abnormal free fluid. IMPRESSION: 1. Thickened endometrium measuring up to 19 mm. Question of focal lesion protruding into the endometrial cavity with associated vascular stalk. In conjunction with prior CT, findings are suspicious for possible endometrial polyp or other lesion. Follow-up examination with sonohysterogram recommended for further evaluation prior to hysteroscopy or endometrial biopsy. 2. 1.4 cm simple right paraovarian cyst, of doubtful significance in this pre menopausal patient. 3. No other acute abnormality within the pelvis. Electronically Signed   By: Jeannine Boga M.D.   On: 01/16/2018 22:17   Ct Abdomen Pelvis W Contrast  Result Date: 01/16/2018 CLINICAL DATA:  Unintended weight loss, nausea and vomiting. Vaginal bleeding for 2 weeks. On prednisone for hepatitis, stopped 1 month ago. History of laparoscopic surgery, cirrhosis. EXAM: CT ABDOMEN AND PELVIS WITH CONTRAST TECHNIQUE: Multidetector CT imaging of the abdomen and pelvis was performed using the standard protocol following bolus administration of intravenous contrast. CONTRAST:  172m OMNIPAQUE IOHEXOL 300 MG/ML  SOLN COMPARISON:  CT abdomen and pelvis November 17, 2010 FINDINGS: LOWER CHEST: Lung bases are clear. Included heart size is  normal. No pericardial effusion. HEPATOBILIARY: Liver and gallbladder are normal. PANCREAS: Normal. SPLEEN: Normal. ADRENALS/URINARY TRACT: Kidneys are orthotopic, demonstrating symmetric enhancement. No nephrolithiasis, hydronephrosis or solid renal masses. The unopacified ureters are normal in course and caliber. Delayed imaging through the kidneys demonstrates symmetric prompt contrast excretion within the proximal urinary collecting system. Urinary bladder is partially distended and unremarkable. Normal adrenal glands. STOMACH/BOWEL: The stomach, small and large bowel are normal in course and caliber without inflammatory changes. Moderate colonic diverticulosis. Normal appendix. VASCULAR/LYMPHATIC: Aortoiliac vessels are normal in course and caliber. Mild calcific atherosclerosis. No lymphadenopathy by CT size criteria. REPRODUCTIVE: 2.3 cm ovoid hyperdensity within the endometrium. Subcentimeter tubular hypodense paraovarian structures. OTHER: No intraperitoneal free fluid or free air. MUSCULOSKELETAL: Nonacute. Mild L5-S1 spondylosis. Moderate lumbar spondylosis. Mild bilateral hip osteoarthrosis. IMPRESSION: 1. 2.3 cm endometrial clot or mass. Mild suspected bilateral hydrosalpinx. Recommend pelvic ultrasound. 2. Moderate colonic diverticulosis without acute diverticulitis. Aortic Atherosclerosis (ICD10-I70.0). Electronically Signed   By: CThana FarrD.  On: 01/16/2018 18:50      IMPRESSION:   *     Jaundice in pt with previous dx of autoimmune hepatitis.  Not well controlled due to patient not taking prednisone for at least a month and taking less than prescribed dose of mercaptopurine.  Has not seen a hepatologist in years.  Previous PMD was willing to continue prescribing these medications but is no longer practicing medicine.   Although she is labeled as having hx hep B infection in 11/2009 had negative Hep B surface Ag, Hep B Core Ab and HCV. If her history is to be trusted, she is  alcohol abstinent for the most part. However with her AST >> ALT, wonder if she isn't consuming more ETOH than she admits.  However, overall level of transaminitis is beyond that normally seen with alcoholic hepatitis. Based on current imaging of CT and ultrasound she does not have cirrhosis.  *    Normocytic anemia.  Recent prolonged, 2-week, history of menorrhagia.  Abnormal pelvic ultrasound as noted above.  *    Noncompliance with medical appts.  This happened years ago with Dr. Watt Climes more recently with [facility]  *   Significant social stressors.   PLAN:     *    Acute hepatitis panel is processing.  *  Should be vaccinated against Hep A and B.    *   Restart prednisone??   Azucena Freed  01/17/2018, 8:07 AM Phone [phone]  I have reviewed the entire case in detail with the above APP and discussed the plan in detail.  Therefore, I agree with the diagnoses recorded above. In addition,  I have personally interviewed and examined the patient and have personally reviewed any abdominal/pelvic CT scan images.  My additional thoughts are as follows:   Long-standing autoimmune hepatitis, noncompliant with follow-up, suboptimal care with prednisone managed by her previous primary care physician until that provider recently left practice and patient is therefore not taken her prednisone.  She has a flare of her autoimmune hepatitis causing nausea vomiting, light-colored stools and itching.  Prothrombin time is normal, so she does not have hepatic failure.  Her lower abdominal pain appears related to her gynecologic process.  Unfortunately, she was discharged from our practice for noncompliance with scheduled appointments.  Speaking with her today, she is aware of that.  She needs regular hepatology and GI care, and reports an upcoming appointment with internal medicine clinic.  She needs referral to another practice in town, as it sounds like she may have also been discharged from [facility]  by her report.  She can be seen by Drs. Collene Mares and Kirk, and there a the local office of the hepatology clinic based out of [facility] in [place]. There is also specialty care available in [place], [place] or [place], though I suspect that may be too far for the patient to manage.  I ordered autoimmune serologies, total IgG level, and viral hepatitis panel to update this patient's work-up.  She was started on prednisone 40 mg once daily earlier today, and I am in agreement with that.  If she is stable tomorrow, I feel she can be discharged home on that dose of prednisone, and she needs follow-up with a new GI/hepatology provider within a month for ongoing management of her condition.   Nelida Meuse III Office:[phone]

## 2018-01-17 NOTE — ED Notes (Signed)
01/17/2018/  Pt. Called and requested her results for her STD.   Available results reviewed with pt.   All questions answered.

## 2018-01-18 LAB — COMPREHENSIVE METABOLIC PANEL
ALT: 272 U/L — ABNORMAL HIGH (ref 14–54)
AST: 286 U/L — ABNORMAL HIGH (ref 15–41)
Albumin: 2.5 g/dL — ABNORMAL LOW (ref 3.5–5.0)
Alkaline Phosphatase: 123 U/L (ref 38–126)
Anion gap: 5 (ref 5–15)
BUN: 7 mg/dL (ref 6–20)
CHLORIDE: 106 mmol/L (ref 101–111)
CO2: 23 mmol/L (ref 22–32)
Calcium: 8.3 mg/dL — ABNORMAL LOW (ref 8.9–10.3)
Creatinine, Ser: 0.59 mg/dL (ref 0.44–1.00)
Glucose, Bld: 227 mg/dL — ABNORMAL HIGH (ref 65–99)
POTASSIUM: 3.8 mmol/L (ref 3.5–5.1)
SODIUM: 134 mmol/L — AB (ref 135–145)
Total Bilirubin: 3 mg/dL — ABNORMAL HIGH (ref 0.3–1.2)
Total Protein: 6.3 g/dL — ABNORMAL LOW (ref 6.5–8.1)

## 2018-01-18 LAB — CBC
HEMATOCRIT: 27.3 % — AB (ref 36.0–46.0)
Hemoglobin: 9 g/dL — ABNORMAL LOW (ref 12.0–15.0)
MCH: 30.4 pg (ref 26.0–34.0)
MCHC: 33 g/dL (ref 30.0–36.0)
MCV: 92.2 fL (ref 78.0–100.0)
Platelets: 582 10*3/uL — ABNORMAL HIGH (ref 150–400)
RBC: 2.96 MIL/uL — AB (ref 3.87–5.11)
RDW: 20.7 % — ABNORMAL HIGH (ref 11.5–15.5)
WBC: 11.4 10*3/uL — AB (ref 4.0–10.5)

## 2018-01-18 LAB — ANTI-SMOOTH MUSCLE ANTIBODY, IGG: F-Actin IgG: 12 Units (ref 0–19)

## 2018-01-18 LAB — HEMOGLOBIN A1C
HEMOGLOBIN A1C: 5.6 % (ref 4.8–5.6)
MEAN PLASMA GLUCOSE: 114.02 mg/dL

## 2018-01-18 LAB — GLUCOSE, CAPILLARY
Glucose-Capillary: 208 mg/dL — ABNORMAL HIGH (ref 65–99)
Glucose-Capillary: 155 mg/dL — ABNORMAL HIGH (ref 65–99)

## 2018-01-18 LAB — HEPATITIS PANEL, ACUTE
HEP B S AG: NEGATIVE
Hep A IgM: NEGATIVE
Hep B C IgM: NEGATIVE

## 2018-01-18 MED ORDER — INSULIN PEN NEEDLE 31G X 5 MM MISC: 100.0000 | Freq: Four times a day (QID) | 12 refills | Status: DC

## 2018-01-18 MED ORDER — METRONIDAZOLE 500 MG PO TABS: 500.0000 mg | ORAL_TABLET | Freq: Two times a day (BID) | ORAL | 0 refills | Status: AC

## 2018-01-18 MED ORDER — INSULIN DETEMIR 100 UNIT/ML ~~LOC~~ SOLN: 10.0000 [IU] | Freq: Every day | SUBCUTANEOUS | 11 refills | Status: DC

## 2018-01-18 MED ORDER — FLUTICASONE FUROATE-VILANTEROL 200-25 MCG/INH IN AEPB: 1.0000 | INHALATION_SPRAY | Freq: Every day | RESPIRATORY_TRACT | 2 refills | Status: DC

## 2018-01-18 MED ORDER — INSULIN ASPART 100 UNIT/ML ~~LOC~~ SOLN: 4.0000 [IU] | Freq: Three times a day (TID) | SUBCUTANEOUS | 11 refills | Status: DC

## 2018-01-18 MED ORDER — OMEPRAZOLE 40 MG PO CPDR: 40.0000 mg | DELAYED_RELEASE_CAPSULE | Freq: Every day | ORAL | 2 refills | Status: DC

## 2018-01-18 MED ORDER — INSULIN ASPART 100 UNIT/ML ~~LOC~~ SOLN
4.0000 [IU] | Freq: Three times a day (TID) | SUBCUTANEOUS | Status: DC
  Administered 2018-01-18 (×2): 4 [IU] via SUBCUTANEOUS

## 2018-01-18 MED ORDER — ALBUTEROL SULFATE HFA 108 (90 BASE) MCG/ACT IN AERS: 1.0000 | INHALATION_SPRAY | Freq: Four times a day (QID) | RESPIRATORY_TRACT | 12 refills | Status: DC | PRN

## 2018-01-18 MED ORDER — PREDNISONE 20 MG PO TABS: 40.0000 mg | ORAL_TABLET | Freq: Every day | ORAL | 0 refills | Status: DC

## 2018-01-18 MED ORDER — BLOOD GLUCOSE MONITOR KIT: PACK | 0 refills | Status: DC

## 2018-01-18 NOTE — Progress Notes (Signed)
Inpatient Diabetes Program Recommendations  AACE/ADA: New Consensus Statement on Inpatient Glycemic Control (2015)  Target Ranges:  Prepandial:   less than 140 mg/dL      Peak postprandial:   less than 180 mg/dL (1-2 hours)      Critically ill patients:  140 - 180 mg/dL   Lab Results  Component Value Date   GLUCAP 155 (H) 01/18/2018   HGBA1C 5.6 01/18/2018   Results for [name], Hannah Young (MRN [id number]) as of 01/18/2018 12:44  Ref. Range 01/17/2018 11:30 01/17/2018 16:55 01/17/2018 21:01 01/18/2018 07:45 01/18/2018 12:11  Glucose-Capillary Latest Ref Range: 65 - 99 mg/dL 865148 (H) 784291 (H) 696250 (H) 208 (H) 155 (H)   Diabetes history: Type 2 DM  Outpatient Diabetes medications: NPH SSI, Metformin  Current orders for Inpatient glycemic control:  Novolog resistant tid with meals and HS, Lantus 10 units q HS, Novolog 4 units tid with  meals  Inpatient Diabetes Program Recommendations:   Agree with current orders.  May consider slight increase of Lantus to 14 units q HS.   Thanks,  Beryl MeagerJenny Nathanie Ottley, RN, BC-ADM Inpatient Diabetes Coordinator Pager [phone] (8a-5p)

## 2018-01-18 NOTE — Progress Notes (Signed)
Subjective: Ms. Veals reports that she did not sleep well last night because of some anxiety about things going on at home. She says that she feels more tired than usual as a result.  Objective: Vital signs in last 24 hours: Vitals:   01/17/18 1657 01/17/18 2114 01/18/18 0433 01/18/18 0746  BP: 117/81 117/66 110/64 (!) 128/94  Pulse: 75 72 66 86  Resp: _0 Temp: 98.7 F (37.1 C) 98 F (36.7 C) 97.9 F (36.6 C)   TempSrc: Oral Oral Oral   SpO2: 100% 100% 100% 99%  Weight:  89.8 kg (197 lb 14.4 oz)    Height:       Weight change: 0.635 kg (1 lb 6.4 oz)  Intake/Output Summary (Last 24 hours) at 01/18/2018 1221 Last data filed at 01/18/2018 1112 Gross per 24 hour  Intake 1422 ml  Output 200 ml  Net 1222 ml   General appearance: uncooperative Neck: no adenopathy, no carotid bruit, no JVD, supple, symmetrical, trachea midline and thyroid: enlarged Lungs: clear to auscultation bilaterally Heart: regular rate and rhythm, S1, S2 normal, no murmur, click, rub or gallop Abdomen: soft, bowel sounds normal; no masses, tender to palpation in the lower quadrants and epigastric region Extremities: no edema, redness or tenderness in the calves or thighs Neurologic: Grossly normal  Lab Results: Hepatic Function Latest Ref Rng & Units 01/18/2018 01/17/2018 01/16/2018  Total Protein 6.5 - 8.1 g/dL 6.3(L) 6.4(L) 8.1  Albumin 3.5 - 5.0 g/dL 2.5(L) 2.5(L) 3.2(L)  AST 15 - 41 U/L 286(H) 520(H) 714(H)  ALT 14 - 54 U/L 272(H) 341(H) 481(H)  Alk Phosphatase 38 - 126 U/L 123 128(H) 135(H)  Total Bilirubin 0.3 - 1.2 mg/dL 3.0(H) 4.0(H) 5.6(H)  Bilirubin, Direct 0.1 - 0.5 mg/dL - - -   CBC Latest Ref Rng & Units 01/18/2018 01/17/2018 01/16/2018  WBC 4.0 - 10.5 K/uL 11.4(H) 8.2 8.3  Hemoglobin 12.0 - 15.0 g/dL 9.0(L) 10.0(L) 11.2(L)  Hematocrit 36.0 - 46.0 % 27.3(L) 30.5(L) 34.3(L)  Platelets 150 - 400 K/uL 582(H) 538(H) 651(H)   Micro Results: Recent Results (from the past 240 hour(s))  Wet prep,  genital     Status: Abnormal   Collection Time: 01/16/18  5:45 PM  Result Value Ref Range Status   Yeast Wet Prep HPF POC NONE SEEN NONE SEEN Final   Trich, Wet Prep NONE SEEN NONE SEEN Final   Clue Cells Wet Prep HPF POC PRESENT (A) NONE SEEN Final   WBC, Wet Prep HPF POC FEW (A) NONE SEEN Final   Sperm NONE SEEN  Final    Comment: Performed at [facility], [place] [address], [place], [place] [address]   Studies/Results: US Abdomen Complete  Result Date: 01/17/2018 CLINICAL DATA:  Weight loss. Nausea, vomiting 3 x today. Abdominal pain. History of hepatitis-B, cirrhosis, hypertension, diabetes. EXAM: ABDOMEN ULTRASOUND COMPLETE COMPARISON:  CT of the abdomen and pelvis on 01/16/2018 FINDINGS: Gallbladder: Gallbladder has a normal appearance. Gallbladder wall is 2.8 millimeters, within normal limits. No stones or pericholecystic fluid. No sonographic Murphy's sign. Common bile duct: Diameter: 1.9 millimeters Liver: No focal lesion identified. Within normal limits in parenchymal echogenicity. Portal vein is patent on color Doppler imaging with normal direction of blood flow towards the liver. IVC: No abnormality visualized. Pancreas: Visualized portion unremarkable. Spleen: Size and appearance within normal limits. Right Kidney: Length: 11.5. Echogenicity within normal limits. No mass or hydronephrosis visualized. Left Kidney: Length: 11.1. Echogenicity within normal limits. No mass or  hydronephrosis visualized. Abdominal aorta: No aneurysm visualized. Other findings: None. IMPRESSION: No acute abnormality of the abdomen. Electronically Signed   By: Nolon Nations M.D.   On: 01/17/2018 09:40   US Transvaginal Non-ob  Result Date: 01/16/2018 CLINICAL DATA:  Initial evaluation for endometrial lesion, suprapubic pain. EXAM: TRANSABDOMINAL AND TRANSVAGINAL ULTRASOUND OF PELVIS TECHNIQUE: Both transabdominal and transvaginal ultrasound examinations of the pelvis were performed. Transabdominal  technique was performed for global imaging of the pelvis including uterus, ovaries, adnexal regions, and pelvic cul-de-sac. It was necessary to proceed with endovaginal exam following the transabdominal exam to visualize the uterus, endometrium, and ovaries. COMPARISON:  Prior CT from earlier the same day. FINDINGS: Uterus Measurements: 9.0 x 4.4 x 5.0 cm. No fibroids or other mass visualized. Endometrium Thickness: 18.8 mm. There is question of a focal lesion protruding into the endometrial lumen with vascular stalk, which could reflect endometrial polyp. This is poorly defined on this exam, but suspected given findings on previous CT. Possible vascular stalk noted on image 53. Right ovary Measurements: 3.1 x 1.5 x 2.3 cm. 1.4 x 1.3 x 0.9 cm simple paraovarian cyst noted, of doubtful significance in this premenopausal patient. Left ovary Measurements: 2.4 x 1.1 x 1.4 cm. Normal appearance/no adnexal mass. Other findings No abnormal free fluid. IMPRESSION: 1. Thickened endometrium measuring up to 19 mm. Question of focal lesion protruding into the endometrial cavity with associated vascular stalk. In conjunction with prior CT, findings are suspicious for possible endometrial polyp or other lesion. Follow-up examination with sonohysterogram recommended for further evaluation prior to hysteroscopy or endometrial biopsy. 2. 1.4 cm simple right paraovarian cyst, of doubtful significance in this pre menopausal patient. 3. No other acute abnormality within the pelvis. Electronically Signed   By: Jeannine Boga M.D.   On: 01/16/2018 22:17   US Pelvis Complete  Result Date: 01/16/2018 CLINICAL DATA:  Initial evaluation for endometrial lesion, suprapubic pain. EXAM: TRANSABDOMINAL AND TRANSVAGINAL ULTRASOUND OF PELVIS TECHNIQUE: Both transabdominal and transvaginal ultrasound examinations of the pelvis were performed. Transabdominal technique was performed for global imaging of the pelvis including uterus, ovaries,  adnexal regions, and pelvic cul-de-sac. It was necessary to proceed with endovaginal exam following the transabdominal exam to visualize the uterus, endometrium, and ovaries. COMPARISON:  Prior CT from earlier the same day. FINDINGS: Uterus Measurements: 9.0 x 4.4 x 5.0 cm. No fibroids or other mass visualized. Endometrium Thickness: 18.8 mm. There is question of a focal lesion protruding into the endometrial lumen with vascular stalk, which could reflect endometrial polyp. This is poorly defined on this exam, but suspected given findings on previous CT. Possible vascular stalk noted on image 53. Right ovary Measurements: 3.1 x 1.5 x 2.3 cm. 1.4 x 1.3 x 0.9 cm simple paraovarian cyst noted, of doubtful significance in this premenopausal patient. Left ovary Measurements: 2.4 x 1.1 x 1.4 cm. Normal appearance/no adnexal mass. Other findings No abnormal free fluid. IMPRESSION: 1. Thickened endometrium measuring up to 19 mm. Question of focal lesion protruding into the endometrial cavity with associated vascular stalk. In conjunction with prior CT, findings are suspicious for possible endometrial polyp or other lesion. Follow-up examination with sonohysterogram recommended for further evaluation prior to hysteroscopy or endometrial biopsy. 2. 1.4 cm simple right paraovarian cyst, of doubtful significance in this pre menopausal patient. 3. No other acute abnormality within the pelvis. Electronically Signed   By: Jeannine Boga M.D.   On: 01/16/2018 22:17   Ct Abdomen Pelvis W Contrast  Result Date: 01/16/2018 CLINICAL DATA:  Unintended weight loss, nausea and vomiting. Vaginal bleeding for 2 weeks. On prednisone for hepatitis, stopped 1 month ago. History of laparoscopic surgery, cirrhosis. EXAM: CT ABDOMEN AND PELVIS WITH CONTRAST TECHNIQUE: Multidetector CT imaging of the abdomen and pelvis was performed using the standard protocol following bolus administration of intravenous contrast. CONTRAST:  162m  OMNIPAQUE IOHEXOL 300 MG/ML  SOLN COMPARISON:  CT abdomen and pelvis November 17, 2010 FINDINGS: LOWER CHEST: Lung bases are clear. Included heart size is normal. No pericardial effusion. HEPATOBILIARY: Liver and gallbladder are normal. PANCREAS: Normal. SPLEEN: Normal. ADRENALS/URINARY TRACT: Kidneys are orthotopic, demonstrating symmetric enhancement. No nephrolithiasis, hydronephrosis or solid renal masses. The unopacified ureters are normal in course and caliber. Delayed imaging through the kidneys demonstrates symmetric prompt contrast excretion within the proximal urinary collecting system. Urinary bladder is partially distended and unremarkable. Normal adrenal glands. STOMACH/BOWEL: The stomach, small and large bowel are normal in course and caliber without inflammatory changes. Moderate colonic diverticulosis. Normal appendix. VASCULAR/LYMPHATIC: Aortoiliac vessels are normal in course and caliber. Mild calcific atherosclerosis. No lymphadenopathy by CT size criteria. REPRODUCTIVE: 2.3 cm ovoid hyperdensity within the endometrium. Subcentimeter tubular hypodense paraovarian structures. OTHER: No intraperitoneal free fluid or free air. MUSCULOSKELETAL: Nonacute. Mild L5-S1 spondylosis. Moderate lumbar spondylosis. Mild bilateral hip osteoarthrosis. IMPRESSION: 1. 2.3 cm endometrial clot or mass. Mild suspected bilateral hydrosalpinx. Recommend pelvic ultrasound. 2. Moderate colonic diverticulosis without acute diverticulitis. Aortic Atherosclerosis (ICD10-I70.0). Electronically Signed   By: CElon AlasM.D.   On: 01/16/2018 18:50   Medications: I have reviewed the patient's current medications. Scheduled Meds: . heparin  5,000 Units Subcutaneous Q8H  . insulin aspart  0-20 Units Subcutaneous TID WC  . insulin aspart  0-5 Units Subcutaneous QHS  . insulin aspart  4 Units Subcutaneous TID WC  . insulin glargine  10 Units Subcutaneous QHS  . metroNIDAZOLE  500 mg Oral Q12H  . mometasone-formoterol  2  puff Inhalation BID  . pantoprazole  40 mg Oral Daily  . predniSONE  40 mg Oral Q breakfast  . sodium chloride flush  3 mL Intravenous Q12H   Continuous Infusions: PRN Meds:.albuterol, diphenhydrAMINE, ondansetron **OR** ondansetron (ZOFRAN) IV, oxyCODONE Assessment/Plan: Principal Problem:   Elevated LFTs Active Problems:   Autoimmune hepatitis ([facility]   Diabetes mellitus ([facility]   Asthma   Hypertension   Vaginal discharge   BV (bacterial vaginosis)  SLoribeth Katichis a 52year old female with history of insulin dependent type II DM, hypertension, and autoimmune hepatitis and cirrhosis diagnosed in 2011 who was admitted for elevated transaminases and bilirubin thought to be secondary to running out of her medications ~1 month ago.   Transaminitis: LFTs continued to downtrend this morning (6/7). Hepatitis panels were negative. Anti-smooth muscle, ANA, mitochondrial assays pending, as well as serum IgG levels.  -continue prednisone 452mdaily for autoimmune hepatitis -Outpatient follow-up with GI -zofran for nausea -toradol for pain -benadryl for itching  Insulin dependent type II DM A1c was 5.6%. Her blood glucose has been elevated since restarting prednisone in the hospital. Her original diabetes presentation coincided with her starting prednisone for autoimmune hepatitis, suggesting steroid induced hyperglycemia. Will continue to monitor blood glucose levels now that prednisone has been restarted -CBG monitoring TID w/meals, qHS -SSI TID with meals, qHS, lantus 10 units QHS added 4 units novolog with meals  Hypertension: Patient has been normotensive since admission.  -hold lisinopril 2044ms she is normotensive -[facility]  Mennorhagia: Prolonged bleeding prior  to admission that has since ceased. Imaging showed possible thickened endometrium with non-specific focal lesion.  -outpatient referral to OBGYN   Bacterial Vaginosis:  presented with recent vaginal discharge with recent history of unprotected intercourse. Wet-prep negative for trichomoniasis, but positive for bacterial vaginosis (clue cells). RPR negative, GC/chlamydia negative, HIV non-reactive -Metronidazole 500 mg BID for 7 days  Dispo: Anticipated discharge today  This is a Careers information officer Note.  The care of the patient was discussed with Dr. Beryle Beams and the assessment and plan formulated with their assistance.  Please see their attached note for official documentation of the daily encounter.   LOS: 2 days   Verdis Frederickson, Medical Student 01/18/2018, 12:21 PM

## 2018-01-18 NOTE — Progress Notes (Signed)
Patient discharged to home. Patient AVS reviewed and signed. Patient capable re-verbalizing medications and follow-up appointments. IV removed. Patient belongings sent with patient. Patient educated to return to the ED in the event of SOB, chest pain or dizziness.   Paloma Grange, RN 

## 2018-01-18 NOTE — Care Management Important Message (Signed)
Important Message  Patient Details  Name: Hannah Young MRN: [id number] Date of Birth: 1965/09/21   Medicare Important Message Given:  No    Tamaya Pun 01/18/2018, 3:48 PM

## 2018-01-18 NOTE — Discharge Summary (Signed)
Medicine attending discharge note: I personally examined this patient on the day of discharge and I attest to the accuracy of the discharge evaluation and plan as recorded in the final progress note by resident physician Dr. Chana BodeWilliam Winfrey.  52 year old woman with type 2 insulin-dependent diabetes and hypertension diagnosed with autoimmune hepatitis in 2011 treated with steroids and mercaptopurine.  She also has a history of reflux esophagitis. She ran out of all of her medications when her usual primary care physician's office close down 1 month ago.  She presented with scleral icterus which she recognized as a sign of her liver disease and concomitant epigastric abdominal pain.  Additional recent medical problems include menorrhagia. She was found to have active hepatitis with transaminases 10 times normal and bilirubin 5.6.  Acute hepatitis profile negative for hepatitis A, B, C.  Negative HIV screen.  She has a prior history of STD, syphilis, treated in the past.  Current wet prep, GC probe, and RPR all negative.  She was started on a course of steroids.  PPIs for her gastritis.  Repeat autoimmune hepatitis profile obtained and results outstanding. Blood sugars were monitored and insulin adjusted. She had intermittent lower abdominal crampy pain but no vaginal bleeding at this time and further evaluation deferred to outpatient OB/GYN.  Transvaginal ultrasound shows an endometrial polyp and thickened endometrial stripe. Liver functions improved on steroids:  AST 714, ALT 481, total bilirubin 5.6, alkaline phosphatase 135 on June 5. AST 286, ALT 272, bilirubin 3.0, alkaline phosphatase 123, on June 7.  Disposition: Condition stable at time of discharge She will establish with our general medical clinic Referral made to gastroenterology for assistance in management of her autoimmune hepatitis There were no complications

## 2018-01-18 NOTE — Progress Notes (Addendum)
Daily Rounding Note  01/18/2018, 8:40 AM  LOS: 2 days   SUBJECTIVE:   Chief complaint: elevated LFTs  fatigue, improved but still a bit weak.  Dressed in street clothes and looking forward to discharge    Has ROV set up with [place] teaching service for 6/14.    OBJECTIVE:         Vital signs in last 24 hours:    Temp:  [97.9 F (36.6 C)-98.7 F (37.1 C)] 97.9 F (36.6 C) (06/07 0433) Pulse Rate:  [66-86] 86 (06/07 0746) Resp:  [16-18] 18 (06/07 0746) BP: (110-128)/(64-94) 128/94 (06/07 0746) SpO2:  [99 %-100 %] 99 % (06/07 0746) Weight:  [197 lb 14.4 oz (89.8 kg)] 197 lb 14.4 oz (89.8 kg) (06/06 2114) Last BM Date: 01/17/18 Filed Weights   01/16/18 2322 01/16/18 2354 01/17/18 2114  Weight: 196 lb 8 oz (89.1 kg) 196 lb (88.9 kg) 197 lb 14.4 oz (89.8 kg)   General: looks better.  Not ill looking.  comfortable   Heart: RRR Chest: clear bil.  No labored breathing or cough Abdomen: soft, NT, ND.  Active BS  Extremities: no CCE Neuro/Psych:  Alert, calm, oriented x 3.    Intake/Output from previous day: 06/06 0701 - 06/07 0700 In: 1602 [P.O.:1602] Out: 200 [Urine:200]  Intake/Output this shift: No intake/output data recorded.  Lab Results: Recent Labs    01/16/18 1150 01/17/18 0450 01/18/18 0556  WBC 8.3 8.2 11.4*  HGB 11.2* 10.0* 9.0*  HCT 34.3* 30.5* 27.3*  PLT 651* 538* 582*   BMET Recent Labs    01/16/18 1150 01/17/18 0450 01/18/18 0556  NA 138 137 134*  K 4.0 3.7 3.8  CL 105 104 106  CO2 25 24 23   GLUCOSE 117* 135* 227*  BUN 6 7 7   CREATININE 0.48 0.54 0.59  CALCIUM 8.8* 8.3* 8.3*   LFT Recent Labs    01/16/18 1150 01/17/18 0450 01/18/18 0556  PROT 8.1 6.4* 6.3*  ALBUMIN 3.2* 2.5* 2.5*  AST 714* 520* 286*  ALT 481* 341* 272*  ALKPHOS 135* 128* 123  BILITOT 5.6* 4.0* 3.0*   PT/INR Recent Labs    01/16/18 1736 01/17/18 0450  LABPROT 13.1 13.5  INR 1.00 1.04   Hepatitis  Panel Recent Labs    01/16/18 1736  HEPBSAG Negative  HCVAB <0.1  HEPAIGM Negative  HEPBIGM Negative    Studies/Results: US Abdomen Complete  Result Date: 01/17/2018 CLINICAL DATA:  Weight loss. Nausea, vomiting 3 x today. Abdominal pain. History of hepatitis-B, cirrhosis, hypertension, diabetes. EXAM: ABDOMEN ULTRASOUND COMPLETE COMPARISON:  CT of the abdomen and pelvis on 01/16/2018 FINDINGS: Gallbladder: Gallbladder has a normal appearance. Gallbladder wall is 2.8 millimeters, within normal limits. No stones or pericholecystic fluid. No sonographic Murphy's sign. Common bile duct: Diameter: 1.9 millimeters Liver: No focal lesion identified. Within normal limits in parenchymal echogenicity. Portal vein is patent on color Doppler imaging with normal direction of blood flow towards the liver. IVC: No abnormality visualized. Pancreas: Visualized portion unremarkable. Spleen: Size and appearance within normal limits. Right Kidney: Length: 11.5. Echogenicity within normal limits. No mass or hydronephrosis visualized. Left Kidney: Length: 11.1. Echogenicity within normal limits. No mass or hydronephrosis visualized. Abdominal aorta: No aneurysm visualized. Other findings: None. IMPRESSION: No acute abnormality of the abdomen. Electronically Signed   By: Norva Pavlov M.D.   On: 01/17/2018 09:40   US Transvaginal Non-ob  Result Date: 01/16/2018 CLINICAL DATA:  Initial evaluation for  endometrial lesion, suprapubic pain. EXAM: TRANSABDOMINAL AND TRANSVAGINAL ULTRASOUND OF PELVIS TECHNIQUE: Both transabdominal and transvaginal ultrasound examinations of the pelvis were performed. Transabdominal technique was performed for global imaging of the pelvis including uterus, ovaries, adnexal regions, and pelvic cul-de-sac. It was necessary to proceed with endovaginal exam following the transabdominal exam to visualize the uterus, endometrium, and ovaries. COMPARISON:  Prior CT from earlier the same day. FINDINGS:  Uterus Measurements: 9.0 x 4.4 x 5.0 cm. No fibroids or other mass visualized. Endometrium Thickness: 18.8 mm. There is question of a focal lesion protruding into the endometrial lumen with vascular stalk, which could reflect endometrial polyp. This is poorly defined on this exam, but suspected given findings on previous CT. Possible vascular stalk noted on image 53. Right ovary Measurements: 3.1 x 1.5 x 2.3 cm. 1.4 x 1.3 x 0.9 cm simple paraovarian cyst noted, of doubtful significance in this premenopausal patient. Left ovary Measurements: 2.4 x 1.1 x 1.4 cm. Normal appearance/no adnexal mass. Other findings No abnormal free fluid. IMPRESSION: 1. Thickened endometrium measuring up to 19 mm. Question of focal lesion protruding into the endometrial cavity with associated vascular stalk. In conjunction with prior CT, findings are suspicious for possible endometrial polyp or other lesion. Follow-up examination with sonohysterogram recommended for further evaluation prior to hysteroscopy or endometrial biopsy. 2. 1.4 cm simple right paraovarian cyst, of doubtful significance in this pre menopausal patient. 3. No other acute abnormality within the pelvis. Electronically Signed   By: Rise Mu M.D.   On: 01/16/2018 22:17   US Pelvis Complete  Result Date: 01/16/2018 CLINICAL DATA:  Initial evaluation for endometrial lesion, suprapubic pain. EXAM: TRANSABDOMINAL AND TRANSVAGINAL ULTRASOUND OF PELVIS TECHNIQUE: Both transabdominal and transvaginal ultrasound examinations of the pelvis were performed. Transabdominal technique was performed for global imaging of the pelvis including uterus, ovaries, adnexal regions, and pelvic cul-de-sac. It was necessary to proceed with endovaginal exam following the transabdominal exam to visualize the uterus, endometrium, and ovaries. COMPARISON:  Prior CT from earlier the same day. FINDINGS: Uterus Measurements: 9.0 x 4.4 x 5.0 cm. No fibroids or other mass visualized.  Endometrium Thickness: 18.8 mm. There is question of a focal lesion protruding into the endometrial lumen with vascular stalk, which could reflect endometrial polyp. This is poorly defined on this exam, but suspected given findings on previous CT. Possible vascular stalk noted on image 53. Right ovary Measurements: 3.1 x 1.5 x 2.3 cm. 1.4 x 1.3 x 0.9 cm simple paraovarian cyst noted, of doubtful significance in this premenopausal patient. Left ovary Measurements: 2.4 x 1.1 x 1.4 cm. Normal appearance/no adnexal mass. Other findings No abnormal free fluid. IMPRESSION: 1. Thickened endometrium measuring up to 19 mm. Question of focal lesion protruding into the endometrial cavity with associated vascular stalk. In conjunction with prior CT, findings are suspicious for possible endometrial polyp or other lesion. Follow-up examination with sonohysterogram recommended for further evaluation prior to hysteroscopy or endometrial biopsy. 2. 1.4 cm simple right paraovarian cyst, of doubtful significance in this pre menopausal patient. 3. No other acute abnormality within the pelvis. Electronically Signed   By: Rise Mu M.D.   On: 01/16/2018 22:17   Ct Abdomen Pelvis W Contrast  Result Date: 01/16/2018 CLINICAL DATA:  Unintended weight loss, nausea and vomiting. Vaginal bleeding for 2 weeks. On prednisone for hepatitis, stopped 1 month ago. History of laparoscopic surgery, cirrhosis. EXAM: CT ABDOMEN AND PELVIS WITH CONTRAST TECHNIQUE: Multidetector CT imaging of the abdomen and pelvis was performed using the standard  protocol following bolus administration of intravenous contrast. CONTRAST:  100mL OMNIPAQUE IOHEXOL 300 MG/ML  SOLN COMPARISON:  CT abdomen and pelvis November 17, 2010 FINDINGS: LOWER CHEST: Lung bases are clear. Included heart size is normal. No pericardial effusion. HEPATOBILIARY: Liver and gallbladder are normal. PANCREAS: Normal. SPLEEN: Normal. ADRENALS/URINARY TRACT: Kidneys are orthotopic,  demonstrating symmetric enhancement. No nephrolithiasis, hydronephrosis or solid renal masses. The unopacified ureters are normal in course and caliber. Delayed imaging through the kidneys demonstrates symmetric prompt contrast excretion within the proximal urinary collecting system. Urinary bladder is partially distended and unremarkable. Normal adrenal glands. STOMACH/BOWEL: The stomach, small and large bowel are normal in course and caliber without inflammatory changes. Moderate colonic diverticulosis. Normal appendix. VASCULAR/LYMPHATIC: Aortoiliac vessels are normal in course and caliber. Mild calcific atherosclerosis. No lymphadenopathy by CT size criteria. REPRODUCTIVE: 2.3 cm ovoid hyperdensity within the endometrium. Subcentimeter tubular hypodense paraovarian structures. OTHER: No intraperitoneal free fluid or free air. MUSCULOSKELETAL: Nonacute. Mild L5-S1 spondylosis. Moderate lumbar spondylosis. Mild bilateral hip osteoarthrosis. IMPRESSION: 1. 2.3 cm endometrial clot or mass. Mild suspected bilateral hydrosalpinx. Recommend pelvic ultrasound. 2. Moderate colonic diverticulosis without acute diverticulitis. Aortic Atherosclerosis (ICD10-I70.0). Electronically Signed   By: Awilda Metroourtnay  Bloomer M.D.   On: 01/16/2018 18:50   Scheduled Meds: . heparin  5,000 Units Subcutaneous Q8H  . insulin aspart  0-20 Units Subcutaneous TID WC  . insulin aspart  0-5 Units Subcutaneous QHS  . insulin aspart  4 Units Subcutaneous TID WC  . insulin glargine  10 Units Subcutaneous QHS  . metroNIDAZOLE  500 mg Oral Q12H  . mometasone-formoterol  2 puff Inhalation BID  . pantoprazole  40 mg Oral Daily  . predniSONE  40 mg Oral Q breakfast  . sodium chloride flush  3 mL Intravenous Q12H   Continuous Infusions: PRN Meds:.albuterol, diphenhydrAMINE, ondansetron **OR** ondansetron (ZOFRAN) IV, oxyCODONE   ASSESMENT:   *   AIH, flared after discontinuing prednisone and skipping doses of methotrexate.  LFTs  improved.  Day 2 Prednisone 40  Mg/day.   Updating hepatitis labs include pending AMA, IgG, ANA, smooth muscle Ab.   HCV Ab, Hep A IgM, Hep B core IgM are all negative.      *    Menorrhagia with endometrial thickening with endometrial polyp vs mass.  Will need fup sonohysterogram.    *   Normocytic anemia.    *   IDDM.     PLAN   *   Continue Prednisone 40 mg daily.   Options for hepatitis follow up include Drs Loreta AveMann and Elnoria HowardHung, [facility] in [place]: [facility] Albt LeDawn Drazeck NP [phone], or possibly [facility] as not clear she was dismissed from his practice, more lost to follow up.  Jennye Moccasin.        Sarah Gribbin  01/18/2018, 8:40 AM Phone [phone]  I have discussed the case with the PA, and that is the plan I formulated.  Acute hep panel neg,. Other AIH labs pending.  Prednisone on board, LFTs improved. OK for discharge from GI standpoint with plan as above.  Charlie PitterHenry L Danis III Office: [phone]

## 2018-01-18 NOTE — Progress Notes (Signed)
Subjective: Pt reports continued improvement in abdominal pain, no nausea or vomiting.      Objective:  Vital signs in last 24 hours: Vitals:   01/17/18 1657 01/17/18 2114 01/18/18 0433 01/18/18 0746  BP: 117/81 117/66 110/64 (!) 128/94  Pulse: 75 72 66 86  Resp: 16 18 16 18   Temp: 98.7 F (37.1 C) 98 F (36.7 C) 97.9 F (36.6 C)   TempSrc: Oral Oral Oral   SpO2: 100% 100% 100% 99%  Weight:  197 lb 14.4 oz (89.8 kg)    Height:       Physical Exam  Constitutional: No distress.  Eyes: Scleral icterus is present.  Cardiovascular: Normal rate, regular rhythm and normal heart sounds. Exam reveals no gallop and no friction rub.  No murmur heard. Pulmonary/Chest: Effort normal and breath sounds normal. No respiratory distress. She has no wheezes. She has no rales. She exhibits no tenderness.  Abdominal: Soft. Bowel sounds are normal. She exhibits no distension and no mass. There is tenderness (epigastric>RUQ improved from day prior). There is no rebound and no guarding.  Musculoskeletal: She exhibits no edema or deformity.  Neurological: She is alert.  Skin: She is not diaphoretic.   CMP Latest Ref Rng & Units 01/18/2018 01/17/2018 01/16/2018  Glucose 65 - 99 mg/dL 227(H) 135(H) 117(H)  BUN 6 - 20 mg/dL 7 7 6   Creatinine 0.44 - 1.00 mg/dL 0.59 0.54 0.48  Sodium 135 - 145 mmol/L 134(L) 137 138  Potassium 3.5 - 5.1 mmol/L 3.8 3.7 4.0  Chloride 101 - 111 mmol/L 106 104 105  CO2 22 - 32 mmol/L 23 24 25   Calcium 8.9 - 10.3 mg/dL 8.3(L) 8.3(L) 8.8(L)  Total Protein 6.5 - 8.1 g/dL 6.3(L) 6.4(L) 8.1  Total Bilirubin 0.3 - 1.2 mg/dL 3.0(H) 4.0(H) 5.6(H)  Alkaline Phos 38 - 126 U/L 123 128(H) 135(H)  AST 15 - 41 U/L 286(H) 520(H) 714(H)  ALT 14 - 54 U/L 272(H) 341(H) 481(H)   Assessment/Plan:  Principal Problem:   Elevated LFTs Active Problems:   Autoimmune hepatitis ([place])   Diabetes mellitus ([facility])   Asthma   Hypertension   Vaginal discharge   BV (bacterial vaginosis)  Acute  hepatitis: Patient with elevated LFTs, T-Bili, and Alk phos on admission. Patient has history of elevated LFT's requiring hospitalization and workup in 2011. At that time patient was under the care of Dr. Watt Climes, who ultimately sent patient for biopsy which revealed chronic active hepatitis with increased liver fibrosis. The final diagnosis was autoimmune hepatitis, but there was some question of whether or not a component of alcoholic cirrhosis was contributing to patient's LFT's as patient admitted to heavy alcohol use in the years leading up to admission in 2011. There is still concern for alcohol use but it is uncertain how much this is contributing.  viral hepatitis panel negative  -GI following -AIH labs pending ordered,  -prednisone 40 mg daily -Supportive care for symptoms of nausea (zofran), pain (toradol), and itching (benadryl)   Insulin dependent T2DM: Last A1C = 6.4% in 01/2017, suggesting good control as outpatient. Ran out of meds one month ago, A1C here 5.6  -CBG monitoring TID with meals, qHS -SSI TID with meals, qHS, lantus 10 units QHS added 4 units novolog with meals    Hypertension: Currently normotensive. On lisinopril 20 mg as outpatient, but has not taken in the past 1 month 2/2 running out of medications and being unable to refill medicines. As stated above, patient would like to reestablish  in [facility] on discharge.   -Hold home lisinopril 20 mg for now, pt normotensive -Continue to monitor   Menorrhagia: Patient with recent prolonged bleeding and workup in ED revealed possible thickened endometrium with ?endometrial polyp or other non-specific focal lesion. Additional imaging requested and possible biopsy if needed by radiologist. Patient will need further workup after discharge for this problem. Resolved while inpatient.   -Referral to OBGYN as outpatient   Bacterial Vaginosis, concern for STI: Patient with recent vaginal discharge and unprotected intercourse with partner  who recently tested positive for syphilis per chart review. Patient's Wet-Prep negative for trichomoniasis and most consistent with bacterial vaginosis (clue cells). Patient will be treated for this diagnosis now and will treat for other conditions which return positive after testing in ED. RPR negative, GC/chlamydia neg  -Metronidazole 500 mg BID for 7 days   Dispo: Anticipated discharge later today   Katherine Roan, MD 01/18/2018, 12:31 PM Vickki Muff MD PGY-1 Internal Medicine Pager # [phone]

## 2018-01-19 LAB — HEPATITIS PANEL, ACUTE
HEP A IGM: NEGATIVE
HEP B C IGM: NEGATIVE
Hepatitis B Surface Ag: NEGATIVE

## 2018-01-19 LAB — MITOCHONDRIAL ANTIBODIES: Mitochondrial M2 Ab, IgG: <20 Units (ref 0.0–20.0)

## 2018-01-19 LAB — ANA: Anti Nuclear Antibody(ANA): NEGATIVE

## 2018-01-19 NOTE — Discharge Summary (Signed)
Name: Hannah Young MRN: [id number] DOB: 04-Jun-1966 52 y.o. PCP: [name], MD  Date of Admission: 01/16/2018 11:28 AM Date of Discharge: 01/18/2018 Attending Physician: Murriel Hopper  Discharge Diagnosis: Autoimmune Hepatitis  Discharge Medications: Allergies as of 01/18/2018      Reactions   Augmentin [amoxicillin-pot Clavulanate] Anaphylaxis   Has patient had a PCN reaction causing immediate rash, facial/tongue/throat swelling, SOB or lightheadedness with hypotension: Yes Has patient had a PCN reaction causing severe rash involving mucus membranes or skin necrosis: No Has patient had a PCN reaction that required hospitalization: No Has patient had a PCN reaction occurring within the last 10 years: Yes If all of the above answers are "NO", then may proceed with Cephalosporin use.   Tylenol [acetaminophen] Other (See Comments)   Patient has liver disease, MD has stated that [place].    Latex Rash      Medication List    STOP taking these medications   ADVAIR DISKUS 250-50 MCG/DOSE Aepb Generic drug:  Fluticasone-Salmeterol   insulin NPH Human 100 UNIT/ML injection Commonly known as:  HUMULIN N,NOVOLIN N   mercaptopurine 50 MG tablet Commonly known as:  PURINETHOL   metFORMIN 1000 MG tablet Commonly known as:  GLUCOPHAGE     TAKE these medications   albuterol 108 (90 Base) MCG/ACT inhaler Commonly known as:  VENTOLIN HFA Inhale 1-2 puffs into the lungs every 6 (six) hours as needed for wheezing or shortness of breath. What changed:  See the new instructions.   blood glucose meter kit and supplies Kit Dispense based on patient and insurance preference. Use up to four times daily as directed. (FOR ICD-9 250.00, 250.01).   diclofenac sodium 1 % Gel Commonly known as:  VOLTAREN Apply 4 g topically 4 (four) times daily.   diphenhydrAMINE 25 MG tablet Commonly known as:  BENADRYL Take 1 tablet (25 mg total) by mouth every 8  (eight) hours as needed for itching.   fluticasone 50 MCG/ACT nasal spray Commonly known as:  FLONASE Place 2 sprays into both nostrils daily as needed for allergies or rhinitis.   fluticasone furoate-vilanterol 200-25 MCG/INH Aepb Commonly known as:  BREO ELLIPTA Inhale 1 puff into the lungs daily.   insulin aspart 100 UNIT/ML injection Commonly known as:  novoLOG Inject 4 Units into the skin 3 (three) times daily with meals.   insulin detemir 100 UNIT/ML injection Commonly known as:  LEVEMIR Inject 0.1 mLs (10 Units total) into the skin at bedtime.   Insulin Pen Needle 31G X 5 MM Misc Commonly known as:  ADVOCATE INSULIN PEN NEEDLES 549 applicators by Does not apply route 4 (four) times daily.   metroNIDAZOLE 500 MG tablet Commonly known as:  FLAGYL Take 1 tablet (500 mg total) by mouth every 12 (twelve) hours for 6 days.   omeprazole 40 MG capsule Commonly known as:  PRILOSEC Take 1 capsule (40 mg total) by mouth daily.   predniSONE 20 MG tablet Commonly known as:  DELTASONE Take 2 tablets (40 mg total) by mouth daily with breakfast.   traZODone 100 MG tablet Commonly known as:  DESYREL Take 150-200 mg by mouth at bedtime as needed for sleep.       Disposition and follow-up:   [name] was discharged from [facility] in good condition.  At the hospital follow up visit please address:  Autoimmune hepatitis  -follow up on labs done while inpatient anti mito, ANA, igG -ensure pt taking  prednisone -ensure pt has follow up with dr hung and that referral went through if not make new referral  T2DM  -worsened by steroids ensure pt picked up her meter, brought logs to adjust insulin -avoid metformin given liver disease, uncertain if she would tolerate a glp-1 inhibitor also avoid sglt-2 inhibitors  BV  -ensure pt took full course of metronidazole  Menorrhagia/endometrial polyp  -resolved but pt will need OBGYN referral for imaging  follow up and possible biopsy -consider iron supplementation    2.  Labs / imaging needed at time of follow-up: cmp, cbc  3.  Pending labs/ test needing follow-up: none  Follow-up Appointments: Follow-up Information    [name], MD Follow up in 1 week(s).   Specialty:  Internal Medicine Why:  you have an appointment on june 14th Contact information: [place] [address] [phone]        Carol Ada, MD Follow up in 2 week(s).   Specialty:  Gastroenterology Why:  we will refer you to Dr. Benson Norway for a follow up appointment for your liver.   Contact information: [place], [address] [place] [address] [phone]           Hospital Course by problem list:  Patient arrived with jaundice, bilateral upper quadrant, midline and lower abdominal pain, menorrhagia and concern for sexually transmitted infection.  He had a minute history of autoimmune hepatitis from about 7 years ago.  She was seen by Dr. Watt Climes she was on 5 mercaptopurine prednisone.  These were continued by her PCP who recently had his practice closed.  She had been out of her immunosuppressant therapy for 1 month.  She was found to have a transaminitis and elevated bilirubin.  There was some concern for alcohol use as well but pt denied recent use.  She responded well to prednisone therapy which was initiated during her initial presentation.  Her transaminase levels declined rapidly as did her bilirubin.  For her gynecologic work-up she was found to have bacterial vaginosis but was RPR HIV gonorrhea and Chlamydia negative.  Additionally some gynecological findings were found on CT abdomen and pelvis which were followed with transvaginal ultrasound which may or may not have been related to her menorrhagia these were not actionable and the patient's menorrhagia resolved before admission and those are found below.  The patient was treated for bacterial vaginosis, her transaminitis was  resolving with steroid therapy, unfortunately the prednisone exacerbated her type 2 diabetes and she was started on Lantus and NovoLog.  Patient was medically stable she was discharged with close follow-up and a referral was placed to Dr. Benson Norway for GI follow-up.   Discharge Vitals:   BP (!) 128/94 (BP Location: Left Arm)   Pulse 86   Temp 97.9 F (36.6 C) (Oral)   Resp 18   Ht 5' 1"  (1.549 m)   Wt 197 lb 14.4 oz (89.8 kg)   SpO2 99%   BMI 37.39 kg/m   Pertinent Labs, Studies, and Procedures:  CMP Latest Ref Rng & Units 01/18/2018 01/17/2018 01/16/2018  Glucose 65 - 99 mg/dL 227(H) 135(H) 117(H)  BUN 6 - 20 mg/dL 7 7 6   Creatinine 0.44 - 1.00 mg/dL 0.59 0.54 0.48  Sodium 135 - 145 mmol/L 134(L) 137 138  Potassium 3.5 - 5.1 mmol/L 3.8 3.7 4.0  Chloride 101 - 111 mmol/L 106 104 105  CO2 22 - 32 mmol/L 23 24 25   Calcium 8.9 - 10.3 mg/dL 8.3(L) 8.3(L) 8.8(L)  Total  Protein 6.5 - 8.1 g/dL 6.3(L) 6.4(L) 8.1  Total Bilirubin 0.3 - 1.2 mg/dL 3.0(H) 4.0(H) 5.6(H)  Alkaline Phos 38 - 126 U/L 123 128(H) 135(H)  AST 15 - 41 U/L 286(H) 520(H) 714(H)  ALT 14 - 54 U/L 272(H) 341(H) 481(H)   CBC Latest Ref Rng & Units 01/18/2018 01/17/2018 01/16/2018  WBC 4.0 - 10.5 K/uL 11.4(H) 8.2 8.3  Hemoglobin 12.0 - 15.0 g/dL 9.0(L) 10.0(L) 11.2(L)  Hematocrit 36.0 - 46.0 % 27.3(L) 30.5(L) 34.3(L)  Platelets 150 - 400 K/uL 582(H) 538(H) 651(H)   Anti mitochondrial antibody neg ANA neg  Microscopic wet-mount exam shows clue cells  Hepatitis B Surface Ag Negative Negative  Negative    HCV Ab 0.0 - 0.9 s/co ratio <0.1  <0.1 CM NEGATIVE R  Comment: (NOTE)                  Negative:   < 0.8                Indeterminate: 0.8 - 0.9                  Positive:   > 0.9  The CDC recommends that a positive HCV antibody result  be followed up with a HCV Nucleic Acid Amplification  test (203559).  Performed At: [facility]  [place], [place]  [id number]  Rush Farmer MD MI:[id number]   Hep A IgM Negative Negative  Negative    Hep B C IgM Negative Negative  Negative CM    CLINICAL DATA:  Unintended weight loss, nausea and vomiting. Vaginal bleeding for 2 weeks. On prednisone for hepatitis, stopped 1 month ago. History of laparoscopic surgery, cirrhosis.  EXAM: CT ABDOMEN AND PELVIS WITH CONTRAST  TECHNIQUE: Multidetector CT imaging of the abdomen and pelvis was performed using the standard protocol following bolus administration of intravenous contrast.  CONTRAST:  180m OMNIPAQUE IOHEXOL 300 MG/ML  SOLN  COMPARISON:  CT abdomen and pelvis November 17, 2010  FINDINGS: LOWER CHEST: Lung bases are clear. Included heart size is normal. No pericardial effusion.  HEPATOBILIARY: Liver and gallbladder are normal.  PANCREAS: Normal.  SPLEEN: Normal.  ADRENALS/URINARY TRACT: Kidneys are orthotopic, demonstrating symmetric enhancement. No nephrolithiasis, hydronephrosis or solid renal masses. The unopacified ureters are normal in course and caliber. Delayed imaging through the kidneys demonstrates symmetric prompt contrast excretion within the proximal urinary collecting system. Urinary bladder is partially distended and unremarkable. Normal adrenal glands.  STOMACH/BOWEL: The stomach, small and large bowel are normal in course and caliber without inflammatory changes. Moderate colonic diverticulosis. Normal appendix.  VASCULAR/LYMPHATIC: Aortoiliac vessels are normal in course and caliber. Mild calcific atherosclerosis. No lymphadenopathy by CT size criteria.  REPRODUCTIVE: 2.3 cm ovoid hyperdensity within the endometrium. Subcentimeter tubular hypodense paraovarian structures.  OTHER: No intraperitoneal free fluid or free air.  MUSCULOSKELETAL: Nonacute. Mild L5-S1 spondylosis. Moderate lumbar spondylosis. Mild bilateral hip osteoarthrosis.  IMPRESSION: 1. 2.3 cm endometrial clot or mass. Mild  suspected bilateral hydrosalpinx. Recommend pelvic ultrasound. 2. Moderate colonic diverticulosis without acute diverticulitis.   Discharge Instructions: Discharge Instructions    Ambulatory referral to Gastroenterology   Complete by:  As directed    Autoimmune hepatitis management   Call MD for:   Complete by:  As directed    Call MD for:  difficulty breathing, headache or visual disturbances   Complete by:  As directed    Call MD for:  extreme fatigue   Complete by:  As directed  Call MD for:  hives   Complete by:  As directed    Call MD for:  persistant dizziness or light-headedness   Complete by:  As directed    Call MD for:  persistant nausea and vomiting   Complete by:  As directed    Call MD for:  redness, tenderness, or signs of infection (pain, swelling, redness, odor or green/yellow discharge around incision site)   Complete by:  As directed    Call MD for:  severe uncontrolled pain   Complete by:  As directed    Call MD for:  temperature >100.4   Complete by:  As directed    Diet - low sodium heart healthy   Complete by:  As directed    Discharge instructions   Complete by:  As directed    Ms. Greeson I have filled your prescriptions to bridge you to your next appointment. We have started you on prednisone for your liver. You will need to take your insulin as prescribed and check your blood sugars three times a day initially as you are now on steroids which will raise your sugars.  Take a reading first thing in the am before breakfast, another reading 2 hours after lunch and the final reading before you go to bed.  Write down these values and bring them to your clinic appointment.  Please follow up in our clinic to adjust your blood sugars and follow up on your referral to a liver specialist Dr. Benson Norway.  We have written for your antibiotics to finish the course to treat your bacterial vaginosis.  You will no longer take metformin due to your liver disease.   Increase  activity slowly   Complete by:  As directed       Signed: Katherine Roan, MD 01/19/2018, 6:06 PM

## 2018-01-20 LAB — IGG: IgG (Immunoglobin G), Serum: 1745 mg/dL — ABNORMAL HIGH (ref 700–1600)

## 2018-01-22 ENCOUNTER — Ambulatory Visit: Payer: Medicare Other | Admitting: Gastroenterology

## 2018-01-23 ENCOUNTER — Encounter: Payer: Medicare Other | Admitting: Podiatry

## 2018-01-25 ENCOUNTER — Ambulatory Visit (INDEPENDENT_AMBULATORY_CARE_PROVIDER_SITE_OTHER): Payer: Medicare Other | Admitting: Internal Medicine

## 2018-01-25 ENCOUNTER — Encounter: Payer: Self-pay | Admitting: Internal Medicine

## 2018-01-25 ENCOUNTER — Other Ambulatory Visit: Payer: Self-pay

## 2018-01-25 VITALS — BP 121/75 | HR 88 | Temp 99.0°F | Ht 61.0 in | Wt 195.4 lb

## 2018-01-25 DIAGNOSIS — N840 Polyp of corpus uteri: Secondary | ICD-10-CM

## 2018-01-25 DIAGNOSIS — Z794 Long term (current) use of insulin: Secondary | ICD-10-CM

## 2018-01-25 DIAGNOSIS — N898 Other specified noninflammatory disorders of vagina: Secondary | ICD-10-CM | POA: Diagnosis not present

## 2018-01-25 DIAGNOSIS — E118 Type 2 diabetes mellitus with unspecified complications: Secondary | ICD-10-CM | POA: Diagnosis not present

## 2018-01-25 DIAGNOSIS — R945 Abnormal results of liver function studies: Secondary | ICD-10-CM

## 2018-01-25 DIAGNOSIS — K754 Autoimmune hepatitis: Secondary | ICD-10-CM

## 2018-01-25 DIAGNOSIS — I10 Essential (primary) hypertension: Secondary | ICD-10-CM

## 2018-01-25 DIAGNOSIS — R7989 Other specified abnormal findings of blood chemistry: Secondary | ICD-10-CM

## 2018-01-25 LAB — GLUCOSE, CAPILLARY: GLUCOSE-CAPILLARY: 251 mg/dL — AB (ref 65–99)

## 2018-01-25 MED ORDER — INSULIN ASPART 100 UNIT/ML ~~LOC~~ SOLN: 5.0000 [IU] | Freq: Three times a day (TID) | SUBCUTANEOUS | 11 refills | Status: DC

## 2018-01-25 MED ORDER — CHOLESTYRAMINE LIGHT 4 G PO PACK
4.0000 g | PACK | Freq: Two times a day (BID) | ORAL | 0 refills | Status: DC
Start: 2018-01-25 — End: 2019-11-24

## 2018-01-25 MED ORDER — INSULIN DETEMIR 100 UNIT/ML ~~LOC~~ SOLN: 10.0000 [IU] | Freq: Every day | SUBCUTANEOUS | 11 refills | Status: DC

## 2018-01-25 NOTE — Assessment & Plan Note (Signed)
Her A1c during hospitalization was 5.6.  Her CBG today was 251, and according to patient she never eat anything today. She does not check her blood sugar at home, stating that she just got her glucometer today. She is on prednisone 40 mg daily. She was discharged on Levemir 10 units at bedtime and NovoLog 4 units 3 times a day with meal.   Had a long discussion about the effect of steroid on her blood sugar. She was encouraged to check her blood glucose levels at least 4 times a day and bring her log in 2 weeks. Increase the dose of Levemir to 12 units at bedtime. She was also told to increase NovoLog from 4units to 5 units with meals. Most likely she will need more adjustment which can only be done if we have some glucometer log.

## 2018-01-25 NOTE — Assessment & Plan Note (Signed)
CT abdomen she was found to have some endometrial polyp and thickening. She is complaining of lower abdominal pain with irregular heavy cycle.  she was given a referral to see a gynecologist for further evaluation.

## 2018-01-25 NOTE — Assessment & Plan Note (Signed)
BP Readings from Last 3 Encounters:  01/25/18 121/75  01/18/18 (!) 128/94  08/15/17 118/87   She was normotensive today. Patient is not taking any antihypertensives at this time.  We will continue to monitor.

## 2018-01-25 NOTE — Assessment & Plan Note (Signed)
Diagnosed with bacterial vaginosis during current hospitalization. Started taking her Flagyl on Tuesday.  She was advised to complete her 7-day course.

## 2018-01-25 NOTE — Assessment & Plan Note (Signed)
He needs an hepatologist for further follow-up and management of her autoimmune hepatitis.  Referral was provided to see [facility]. 3 of the GI practices in [place] refused to take her back.  Talked with the patient and told her the importance of staying compliant and go for her follow-up appointments. We will continue prednisone 40 mg daily at this point. Repeat CMP and CBC.

## 2018-01-25 NOTE — Patient Instructions (Signed)
Thank you for visiting clinic today. As we discussed it is really important for you to see a liver doctor so you can resume your medication. We are checking your liver function today. I am giving you a medicine called cholestyramine for your itching, take it twice daily and see if that will help.  You can continue taking Benadryl as needed. I am also giving you a referral to see a gynecologist for your abnormal bleeding and pain. As we discussed your blood sugar seems little high in the clinic today and you has not eaten anything, I am making small adjustment in your insulin dosage as you are on steroid and it can increase your blood sugar.  Not you will take Levemir 12 units at bedtime and NovoLog 5 units 3 times a day with meals. Please check your blood sugar 3-4 times a day, fasting and 1-1/2-hour after meal and bring your glucometer with your follow-up appointment in 2 weeks. Please do not miss your appointments with your liver doctor or gynecologist.

## 2018-01-25 NOTE — Progress Notes (Signed)
   CC: For hospital follow-up, recent admission due to acute exacerbation of her autoimmune hepatitis.  HPI:  [name] is a 52 y.o. with past medical history as listed below came to the clinic for hospital follow-up.  She was admitted at [facility] earlier this month with acute exacerbation of her autoimmune hepatitis.  She was diagnosed with autoimmune hepatitis in 2011 and since then she was taking prednisone and mercaptopurine, recently ran out of her medication. She was dismissed from 3 GI practices in [place] because of being noncompliant and missing her appointments.  She will need an appointment with a hepatologist. Her IgG was elevated with negative antimitochondrial antibody and anti-smooth muscle antibody.  She was complaining of some dyspepsia and nausea, occasionally vomits in the morning, light-colored stools and generalized itching.  She was also complaining of irregular bleeding and lower abdominal pain. She was found to have bacterial vaginosis during current hospitalization and was given a course of Flagyl, she obtained her medication on Monday and then started taking it.  She was also asking for a referral to see a gynecologist which was provided.  Please see assessment and plan for her chronic conditions.  Past Medical History:  Diagnosis Date  . Anxiety   . Arthritis   . Asthma   . Bipolar 1 disorder (HCC)   . Breast lump   . Cirrhosis (HCC)   . Depression   . Diabetes mellitus   . FH: chemotherapy   . Gout   . Hepatitis B infection   . High cholesterol   . Hyperglycemia   . Hypertension   . Liver cirrhosis (HCC)   . Lung nodules   . Neuropathy   . Panic disorder   . Vertigo    Review of Systems: Negative except mentioned in HPI.  Physical Exam:  Vitals:   01/25/18 1427  BP: 121/75  Pulse: 88  Temp: 99 F (37.2 C)  TempSrc: Oral  SpO2: 100%  Weight: 195 lb 6.4 oz (88.6 kg)  Height: 5\' 1"  (1.549 m)    General: Vital  signs reviewed.  Patient is well-developed and well-nourished, in no acute distress and cooperative with exam.  Head: Normocephalic and atraumatic. Eyes: EOMI, conjunctivae normal, mild scleral icterus.  Cardiovascular: RRR, S1 normal, S2 normal, no murmurs, gallops, or rubs. Pulmonary/Chest: Clear to auscultation bilaterally, no wheezes, rales, or rhonchi. Abdominal: Soft, lower abdominal tenderness ,non-distended, BS +,  Extremities: No lower extremity edema bilaterally,  pulses symmetric and intact bilaterally. No cyanosis or clubbing. Neurological: A&O x3, Strength is normal and symmetric bilaterally, cranial nerve II-XII are grossly intact, no focal motor deficit, sensory intact to light touch bilaterally.  Skin: Warm, dry and intact. No rashes or erythema. Psychiatric: Normal mood and affect. speech and behavior is normal. Cognition and memory are normal.  Assessment & Plan:   See Encounters Tab for problem based charting.  Patient discussed with Dr. Sandre Kittyaines.

## 2018-01-26 LAB — CMP14 + ANION GAP
ALBUMIN: 4 g/dL (ref 3.5–5.5)
ALK PHOS: 128 IU/L — AB (ref 39–117)
ALT: 170 IU/L — AB (ref 0–32)
ANION GAP: 14 mmol/L (ref 10.0–18.0)
AST: 227 IU/L — ABNORMAL HIGH (ref 0–40)
Albumin/Globulin Ratio: 1.1 — ABNORMAL LOW (ref 1.2–2.2)
BUN / CREAT RATIO: 21 (ref 9–23)
BUN: 15 mg/dL (ref 6–24)
Bilirubin Total: 2.3 mg/dL — ABNORMAL HIGH (ref 0.0–1.2)
CO2: 20 mmol/L (ref 20–29)
CREATININE: 0.73 mg/dL (ref 0.57–1.00)
Calcium: 9.1 mg/dL (ref 8.7–10.2)
Chloride: 99 mmol/L (ref 96–106)
GFR calc Af Amer: 112 mL/min/{1.73_m2} (ref >59)
GFR calc non Af Amer: 97 mL/min/{1.73_m2} (ref >59)
Globulin, Total: 3.6 g/dL (ref 1.5–4.5)
Glucose: 235 mg/dL — ABNORMAL HIGH (ref 65–99)
Potassium: 4.8 mmol/L (ref 3.5–5.2)
Sodium: 133 mmol/L — ABNORMAL LOW (ref 134–144)
TOTAL PROTEIN: 7.6 g/dL (ref 6.0–8.5)

## 2018-01-26 LAB — CBC
HEMATOCRIT: 32.4 % — AB (ref 34.0–46.6)
HEMOGLOBIN: 10.3 g/dL — AB (ref 11.1–15.9)
MCH: 29.9 pg (ref 26.6–33.0)
MCHC: 31.8 g/dL (ref 31.5–35.7)
MCV: 94 fL (ref 79–97)
Platelets: 659 10*3/uL — ABNORMAL HIGH (ref 150–450)
RBC: 3.45 x10E6/uL — ABNORMAL LOW (ref 3.77–5.28)
RDW: 18.6 % — AB (ref 12.3–15.4)
WBC: 10.7 10*3/uL (ref 3.4–10.8)

## 2018-01-29 ENCOUNTER — Encounter: Payer: Self-pay | Admitting: Obstetrics and Gynecology

## 2018-02-01 NOTE — Progress Notes (Signed)
Internal Medicine Clinic Attending  Case discussed with Dr. Amin  at the time of the visit.  We reviewed the resident's history and exam and pertinent patient test results.  I agree with the assessment, diagnosis, and plan of care documented in the resident's note.  Alexander N Raines, MD   

## 2018-02-04 ENCOUNTER — Other Ambulatory Visit: Payer: Self-pay

## 2018-02-04 ENCOUNTER — Other Ambulatory Visit: Payer: Self-pay | Admitting: Internal Medicine

## 2018-02-04 DIAGNOSIS — Z794 Long term (current) use of insulin: Secondary | ICD-10-CM

## 2018-02-04 DIAGNOSIS — E118 Type 2 diabetes mellitus with unspecified complications: Secondary | ICD-10-CM

## 2018-02-04 NOTE — Telephone Encounter (Signed)
Needs to speak with a nurse about meds. Please call pt back.  

## 2018-02-04 NOTE — Telephone Encounter (Signed)
Patient says the script she got was not honored at pharmacies. She needs new on sent to [facility]. Needs meter, strips and lancets and does not have a preference about which one she gets.

## 2018-02-04 NOTE — Telephone Encounter (Signed)
Patient wants a nurse to callback to let her know what is going with her medicine If any questions call pt

## 2018-02-04 NOTE — Progress Notes (Signed)
This encounter was created in error - please disregard.

## 2018-02-05 ENCOUNTER — Telehealth: Payer: Self-pay | Admitting: Internal Medicine

## 2018-02-05 MED ORDER — GLUCOSE BLOOD VI STRP
ORAL_STRIP | 12 refills | Status: DC
Start: 2018-02-05 — End: 2018-04-29

## 2018-02-05 MED ORDER — BAYER MICROLET LANCETS MISC: 12 refills | Status: DC

## 2018-02-05 MED ORDER — CONTOUR NEXT MONITOR W/DEVICE KIT: 1.0000 | PACK | Freq: Three times a day (TID) | 1 refills | Status: DC

## 2018-02-05 NOTE — Telephone Encounter (Signed)
Requests approved by PCP 02/05/2018. Kinnie FeilL. Perfecto Purdy, RN, BSN

## 2018-02-05 NOTE — Telephone Encounter (Signed)
Patient is requesting refill on prednisone 20mg , and she also need a medicine BV(allergic to soap) pls send to [facility].  Patient is also saying she is will need something for yeast because her insulin was increase   Pls call patient for any questions

## 2018-02-05 NOTE — Telephone Encounter (Signed)
These refill requests sent to PCP 02/04/2018. Still pending. Kinnie FeilL. Ducatte, RN, BSN

## 2018-02-05 NOTE — Telephone Encounter (Signed)
These medicines should not be continued. Refused refill.

## 2018-02-06 ENCOUNTER — Other Ambulatory Visit: Payer: Self-pay | Admitting: Internal Medicine

## 2018-02-06 NOTE — Telephone Encounter (Signed)
Called patient to inform her PCP declined to refill these meds. Patient states she is supposed to be on prednisone 40 mg daily. Note from OV on 01/25/2018 confirms this. States that she was dx with BV as in-patient and then was not able to get flagyl till later so BV still present. States she has been unable to check CBG as she has been unable to get glucometer but should have one today. Feels she also has vaginal yeast infection. Patient has no transportation so is unable to come to clinic prior to f/u appt with PCP on 02/25/2018. Please advise. Kinnie FeilL. Sereen Schaff, RN, BSN

## 2018-02-06 NOTE — Telephone Encounter (Signed)
This is being addressed in separate refill encounter from 02/04/2018. Kinnie FeilL. Ducatte, RN, BSN

## 2018-02-07 ENCOUNTER — Other Ambulatory Visit: Payer: Self-pay | Admitting: Internal Medicine

## 2018-02-07 MED ORDER — PREDNISONE 20 MG PO TABS: 40.0000 mg | ORAL_TABLET | Freq: Every day | ORAL | 0 refills | Status: DC

## 2018-02-11 ENCOUNTER — Telehealth: Payer: Self-pay

## 2018-02-11 DIAGNOSIS — Z1379 Encounter for other screening for genetic and chromosomal anomalies: Secondary | ICD-10-CM | POA: Diagnosis not present

## 2018-02-11 DIAGNOSIS — Z801 Family history of malignant neoplasm of trachea, bronchus and lung: Secondary | ICD-10-CM | POA: Diagnosis not present

## 2018-02-11 DIAGNOSIS — Z808 Family history of malignant neoplasm of other organs or systems: Secondary | ICD-10-CM | POA: Diagnosis not present

## 2018-02-11 DIAGNOSIS — Z802 Family history of malignant neoplasm of other respiratory and intrathoracic organs: Secondary | ICD-10-CM | POA: Diagnosis not present

## 2018-02-11 NOTE — Telephone Encounter (Signed)
Needs to speak with a nurse about meds. Please call pt back.  

## 2018-02-11 NOTE — Telephone Encounter (Signed)
Spoke with Dr Nelson ChimesAmin, she has addressed the prednisone order with a separate order. Futher flagyl Rx is not needed.

## 2018-02-11 NOTE — Telephone Encounter (Signed)
Note from 02/06/2018 rerouted to PCP and Attending Pool. Kinnie FeilL. Ducatte, RN, BSN

## 2018-02-13 ENCOUNTER — Other Ambulatory Visit: Payer: Self-pay | Admitting: Internal Medicine

## 2018-02-13 MED ORDER — MICONAZOLE NITRATE 1200 & 2 MG & % VA KIT: 1.0000 | PACK | Freq: Once | VAGINAL | 0 refills | Status: AC

## 2018-02-13 MED ORDER — FLUCONAZOLE 150 MG PO TABS: 150.0000 mg | ORAL_TABLET | Freq: Every day | ORAL | 0 refills | Status: DC

## 2018-02-13 NOTE — Telephone Encounter (Signed)
Call from Dr Nelson ChimesAmin - stated pt needs to be seen, explained her situation w/bed bugs and being closed tomorrow. Stated she will send in a rx for Diflucan but she needs to f/u in the clinic also with GI (referral in process).  Pt called / informed of rx for Diflucan, sent to [facility].   Has n appt w/Dr Nelson ChimesAmin on the 15 th - but her doctor wanted to be seen sooner. Pt stated once the problem w/bedbugs has resolved , she will call for an appt.

## 2018-02-13 NOTE — Telephone Encounter (Signed)
Hannah Young - she has significant liver chem abnormalities so I would use topical monistat cream and not diflucan. Dr GReece Agar

## 2018-02-13 NOTE — Telephone Encounter (Signed)
Patient is on steroid, needs an evaluation in the clinic. If patient cannot come today I am sending a prescription for Diflucan to her pharmacy, please asked the [facility].

## 2018-02-13 NOTE — Telephone Encounter (Signed)
I called the pharmacy and discontinue Diflucan. A prescription for Monistat for 1 dose was sent.

## 2018-02-13 NOTE — Telephone Encounter (Signed)
Call from pt - states she has been calling since last week about yeast infection; now having thick "cottage cheese" - tongue/throat/vagina areas. Difficulty swallowing. States everytime she takes abx she gets a yeast infection. "Feels like I'm on fire". States unable to come in d/t bed bugs- exterminator not coming until Friday.  See previous phone encounters.

## 2018-02-21 ENCOUNTER — Encounter: Payer: Medicare Other | Admitting: Obstetrics and Gynecology

## 2018-02-25 ENCOUNTER — Encounter (INDEPENDENT_AMBULATORY_CARE_PROVIDER_SITE_OTHER): Payer: Self-pay

## 2018-02-25 ENCOUNTER — Encounter: Payer: Self-pay | Admitting: Internal Medicine

## 2018-02-25 ENCOUNTER — Other Ambulatory Visit: Payer: Self-pay

## 2018-02-25 ENCOUNTER — Ambulatory Visit (INDEPENDENT_AMBULATORY_CARE_PROVIDER_SITE_OTHER): Payer: Medicare Other | Admitting: Internal Medicine

## 2018-02-25 VITALS — BP 120/81 | HR 86 | Temp 98.2°F | Ht 61.0 in | Wt 182.0 lb

## 2018-02-25 DIAGNOSIS — J029 Acute pharyngitis, unspecified: Secondary | ICD-10-CM | POA: Diagnosis not present

## 2018-02-25 DIAGNOSIS — N840 Polyp of corpus uteri: Secondary | ICD-10-CM

## 2018-02-25 DIAGNOSIS — E118 Type 2 diabetes mellitus with unspecified complications: Secondary | ICD-10-CM

## 2018-02-25 DIAGNOSIS — R739 Hyperglycemia, unspecified: Secondary | ICD-10-CM

## 2018-02-25 DIAGNOSIS — K754 Autoimmune hepatitis: Secondary | ICD-10-CM | POA: Diagnosis not present

## 2018-02-25 DIAGNOSIS — E1165 Type 2 diabetes mellitus with hyperglycemia: Secondary | ICD-10-CM | POA: Diagnosis not present

## 2018-02-25 DIAGNOSIS — Z794 Long term (current) use of insulin: Secondary | ICD-10-CM | POA: Diagnosis not present

## 2018-02-25 LAB — URINALYSIS, ROUTINE W REFLEX MICROSCOPIC
BACTERIA UA: NONE SEEN
BILIRUBIN URINE: NEGATIVE
Glucose, UA: >=500 mg/dL — AB
HGB URINE DIPSTICK: NEGATIVE
Ketones, ur: 5 mg/dL — AB
Leukocytes, UA: NEGATIVE
Nitrite: NEGATIVE
PROTEIN: NEGATIVE mg/dL
Specific Gravity, Urine: 1.026 (ref 1.005–1.030)
pH: 6 (ref 5.0–8.0)

## 2018-02-25 LAB — GLUCOSE, CAPILLARY
GLUCOSE-CAPILLARY: 512 mg/dL — AB (ref 70–99)
Glucose-Capillary: >600 mg/dL (ref 70–99)

## 2018-02-25 LAB — BASIC METABOLIC PANEL
Anion gap: 12 (ref 5–15)
BUN: 14 mg/dL (ref 6–20)
CHLORIDE: 95 mmol/L — AB (ref 98–111)
CO2: 25 mmol/L (ref 22–32)
Calcium: 9.3 mg/dL (ref 8.9–10.3)
Creatinine, Ser: 0.7 mg/dL (ref 0.44–1.00)
GFR calc Af Amer: >60 mL/min (ref >60)
GFR calc non Af Amer: >60 mL/min (ref >60)
Glucose, Bld: 614 mg/dL (ref 70–99)
POTASSIUM: 4.1 mmol/L (ref 3.5–5.1)
SODIUM: 132 mmol/L — AB (ref 135–145)

## 2018-02-25 MED ORDER — SODIUM CHLORIDE 0.9 % IV BOLUS: 1000.0000 mL | Freq: Once | INTRAVENOUS | Status: DC

## 2018-02-25 MED ORDER — CLINDAMYCIN HCL 300 MG PO CAPS: 300.0000 mg | ORAL_CAPSULE | Freq: Three times a day (TID) | ORAL | 0 refills | Status: AC

## 2018-02-25 MED ORDER — INSULIN ASPART 100 UNIT/ML ~~LOC~~ SOLN
20.0000 [IU] | Freq: Once | SUBCUTANEOUS | Status: AC
  Administered 2018-02-25: 20 [IU] via SUBCUTANEOUS

## 2018-02-25 NOTE — Assessment & Plan Note (Signed)
Her symptoms and exams were more consistent with tonsillitis.  She was provided with a prescription for clindamycin due to her penicillin allergies.

## 2018-02-25 NOTE — Patient Instructions (Addendum)
Thank you for visiting clinic today. I am giving you antibiotics for your sore throat, you will take clindamycin  3 times a day for 5 days. As we discussed that your blood sugar was very high, we are unable to completely take care of that because you have to leave today. I want you to start taking your insulin as it was directed before, check your blood sugar 3-4 times a day and bring your glucometer with you during your next follow-up appointment in the next couple of weeks. It is very important to keep your blood sugar under good control, as you are taking steroid for your liver disease which can increase your blood sugar. Please follow-up in the next 2 to 3 weeks with your glucometer for adjustment in your insulin regimen.

## 2018-02-25 NOTE — Progress Notes (Signed)
   CC: Sore throat, polyuria and polydipsia.  HPI:  [name] is a 52 y.o. with past medical history as listed below came to the clinic today with complaint of polyuria, polydipsia and sore throat for the past week.  Polyuria polydipsia. Patient is having a lot of social stresses and was not checking her blood sugar or taking her insulin for the past few days. She was also having some blurry vision, weight loss, mild nausea no vomiting, becoming more restless and inability to sleep at night, for which she blamed her current social situation.  Weight loss. She has a decreased in her appetite, weight loss of about 13 pounds in 1 month. She denies any change in her bowel habit. She was found to have some endometrial thickening and polyp on a CT abdomen done during previous hospitalization, she to have irregular heavy vaginal bleeding, she was referred to see a gynecologist, she missed her appointment not rescheduled for March 28, 2018.  Sore throat. She was also complaining of sore throat, denies any fever or sick contact.  She denies any postnasal drip or sinus congestion.  No cough.  Please see assessment and plan for her chronic conditions.  Past Medical History:  Diagnosis Date  . Anxiety   . Arthritis   . Asthma   . Bipolar 1 disorder (HCC)   . Breast lump   . Cirrhosis (HCC)   . Depression   . Diabetes mellitus   . FH: chemotherapy   . Gout   . Hepatitis B infection   . High cholesterol   . Hyperglycemia   . Hypertension   . Liver cirrhosis (HCC)   . Lung nodules   . Neuropathy   . Panic disorder   . Vertigo    Review of Systems: Negative except mentioned in HPI.  Physical Exam:  Vitals:   02/25/18 1408  BP: 120/81  Pulse: 86  Temp: 98.2 F (36.8 C)  TempSrc: Oral  SpO2: 100%  Weight: 182 lb (82.6 kg)  Height: 5\' 1"  (1.549 m)    General: Vital signs reviewed.  Patient is well-developed and well-nourished, in no acute distress and cooperative  with exam.  HEENT: Normocephalic and atraumatic. Enlarged tonsils with prominent pustules. Eyes: EOMI, conjunctivae normal, mild scleral icterus.  Cardiovascular: RRR, S1 normal, S2 normal, no murmurs, gallops, or rubs. Pulmonary/Chest: Clear to auscultation bilaterally, no wheezes, rales, or rhonchi. Abdominal: Soft, non-tender, non-distended, BS +, no masses, organomegaly, or guarding present.  Extremities: No lower extremity edema bilaterally,  pulses symmetric and intact bilaterally. No cyanosis or clubbing. Skin: Warm, dry and intact. No rashes or erythema. Psychiatric: Normal mood and affect. speech and behavior is normal. Cognition and memory are normal.  Assessment & Plan:   See Encounters Tab for problem based charting.  Patient discussed with Dr. Heide SparkNarendra.

## 2018-02-25 NOTE — Assessment & Plan Note (Signed)
She missed her appointment with gynecologist. She needs gynecologic evaluation to rule out any uterine malignancy, according to patient she is losing weight, uncontrolled diabetes can cause weight loss, patient is on steroid.  She rescheduled her appointment for next month. She was advised to keep up with that appointment.

## 2018-02-25 NOTE — Progress Notes (Signed)
[facility] pt's glucose is 614; Dr Nelson ChimesAmin was already aware.

## 2018-02-25 NOTE — Assessment & Plan Note (Signed)
Continue to take prednisone 40 mg daily, has not seen a hepatologist yet.  According to patient she never Received any call, referral was provided on June 14, during follow-up, it was sent to a different place.  She was feeling little restless and difficulty falling asleep-may be secondary to steroid use.  Denies any worsening of nausea or vomiting, no pain.  She alone is going to re sent her referral to [facility].

## 2018-02-25 NOTE — Assessment & Plan Note (Signed)
Patient being noncompliant with medication came with this complaint of polyuria and polydipsia and found to have a blood sugar above 600. Stat BMP was without any acidosis or gap.  UA was positive for glucose and 5 ketones. She was having some concerning features for HHS.  Patient was offered an admission in the hospital for bringing her blood sugar down. She declined admission, stating that she has to go home because of some social issues.  We tried giving her bolus in clinic she also, refused to take any IV fluid stating that her son is waiting and she has to go.  She  promised that she will start taking her insulin and start checking her blood sugar from now onward.  She was provided with NovoLog 20 units and on recheck her CBG was 512. She was advised to go to emergency room for nausea get worse or she develops more blurry vision. She was advised to follow-up in clinic with her glucometer in 2 weeks. Patient is on steroid and might need higher doses of insulin for a better control.

## 2018-02-25 NOTE — Assessment & Plan Note (Signed)
BP Readings from Last 3 Encounters:  02/25/18 120/81  01/25/18 121/75  08/15/17 118/87   She was normotensive.  She is not on any antihypertensives at this time.

## 2018-02-27 NOTE — Progress Notes (Signed)
Internal Medicine Clinic Attending  Case discussed with Dr. Amin at the time of the visit.  We reviewed the resident's history and exam and pertinent patient test results.  I agree with the assessment, diagnosis, and plan of care documented in the resident's note.    

## 2018-03-04 DIAGNOSIS — E1165 Type 2 diabetes mellitus with hyperglycemia: Secondary | ICD-10-CM | POA: Diagnosis not present

## 2018-03-05 ENCOUNTER — Telehealth: Payer: Self-pay | Admitting: Internal Medicine

## 2018-03-05 ENCOUNTER — Encounter (HOSPITAL_COMMUNITY): Payer: Self-pay | Admitting: Emergency Medicine

## 2018-03-05 ENCOUNTER — Other Ambulatory Visit: Payer: Self-pay

## 2018-03-05 ENCOUNTER — Emergency Department (HOSPITAL_COMMUNITY)
Admission: EM | Admit: 2018-03-05 | Discharge: 2018-03-05 | Disposition: A | Discharge status: Home / Self Care | Payer: Medicare Other | Attending: Emergency Medicine | Admitting: Emergency Medicine

## 2018-03-05 DIAGNOSIS — Z91040 Latex allergy status: Secondary | ICD-10-CM | POA: Diagnosis not present

## 2018-03-05 DIAGNOSIS — J45909 Unspecified asthma, uncomplicated: Secondary | ICD-10-CM | POA: Diagnosis not present

## 2018-03-05 DIAGNOSIS — Z794 Long term (current) use of insulin: Secondary | ICD-10-CM | POA: Diagnosis not present

## 2018-03-05 DIAGNOSIS — I10 Essential (primary) hypertension: Secondary | ICD-10-CM | POA: Diagnosis not present

## 2018-03-05 DIAGNOSIS — E1165 Type 2 diabetes mellitus with hyperglycemia: Secondary | ICD-10-CM | POA: Diagnosis not present

## 2018-03-05 DIAGNOSIS — E119 Type 2 diabetes mellitus without complications: Secondary | ICD-10-CM | POA: Insufficient documentation

## 2018-03-05 DIAGNOSIS — R739 Hyperglycemia, unspecified: Secondary | ICD-10-CM | POA: Diagnosis not present

## 2018-03-05 DIAGNOSIS — Z79899 Other long term (current) drug therapy: Secondary | ICD-10-CM | POA: Diagnosis not present

## 2018-03-05 DIAGNOSIS — F17210 Nicotine dependence, cigarettes, uncomplicated: Secondary | ICD-10-CM | POA: Insufficient documentation

## 2018-03-05 LAB — URINALYSIS, ROUTINE W REFLEX MICROSCOPIC
BILIRUBIN URINE: NEGATIVE
HGB URINE DIPSTICK: NEGATIVE
KETONES UR: 5 mg/dL — AB
Leukocytes, UA: NEGATIVE
Nitrite: NEGATIVE
PROTEIN: NEGATIVE mg/dL
Specific Gravity, Urine: 1.036 — ABNORMAL HIGH (ref 1.005–1.030)
pH: 5 (ref 5.0–8.0)

## 2018-03-05 LAB — BASIC METABOLIC PANEL
ANION GAP: 15 (ref 5–15)
BUN: 12 mg/dL (ref 6–20)
CALCIUM: 9.7 mg/dL (ref 8.9–10.3)
CHLORIDE: 103 mmol/L (ref 98–111)
CO2: 23 mmol/L (ref 22–32)
Creatinine, Ser: 0.77 mg/dL (ref 0.44–1.00)
GFR calc non Af Amer: >60 mL/min (ref >60)
GLUCOSE: 377 mg/dL — AB (ref 70–99)
POTASSIUM: 3.9 mmol/L (ref 3.5–5.1)
Sodium: 141 mmol/L (ref 135–145)

## 2018-03-05 LAB — CBG MONITORING, ED
GLUCOSE-CAPILLARY: 388 mg/dL — AB (ref 70–99)
Glucose-Capillary: 340 mg/dL — ABNORMAL HIGH (ref 70–99)

## 2018-03-05 LAB — I-STAT BETA HCG BLOOD, ED (MC, WL, AP ONLY): I-stat hCG, quantitative: <5 m[IU]/mL (ref <5)

## 2018-03-05 LAB — CBC
HEMATOCRIT: 33.4 % — AB (ref 36.0–46.0)
HEMOGLOBIN: 10.7 g/dL — AB (ref 12.0–15.0)
MCH: 31.1 pg (ref 26.0–34.0)
MCHC: 32 g/dL (ref 30.0–36.0)
MCV: 97.1 fL (ref 78.0–100.0)
Platelets: 462 10*3/uL — ABNORMAL HIGH (ref 150–400)
RBC: 3.44 MIL/uL — AB (ref 3.87–5.11)
RDW: 18.2 % — ABNORMAL HIGH (ref 11.5–15.5)
WBC: 15.6 10*3/uL — ABNORMAL HIGH (ref 4.0–10.5)
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
## 2018-03-05 NOTE — Telephone Encounter (Signed)
Called the patient with no response.

## 2018-03-05 NOTE — Telephone Encounter (Signed)
Pt is still having problems with her throat, she is wanting to know if the physician can call her some medicine @ [facility], pt contact # [phone]

## 2018-03-05 NOTE — Discharge Instructions (Addendum)
Please contact your physician regarding additional advice managing your elevated blood sugars

## 2018-03-05 NOTE — ED Triage Notes (Signed)
Pt from home. Has been taking her insulin as directed but sugars at home have ranged 280 to 500s.  Tonight with EMS 368.  Has been on prednisone for liver CA and cirrhosis.  Skipped her prednisone tx last week and since then has not been able to control her sugar.  VSS

## 2018-03-06 ENCOUNTER — Other Ambulatory Visit: Payer: Self-pay | Admitting: Internal Medicine

## 2018-03-06 MED ORDER — MICONAZOLE NITRATE 1200 & 2 MG & % VA KIT
1.0000 | PACK | Freq: Once | VAGINAL | 0 refills | Status: AC
Start: 2018-03-06 — End: 2018-03-06

## 2018-03-06 MED ORDER — PREDNISONE 20 MG PO TABS: 40.0000 mg | ORAL_TABLET | Freq: Every day | ORAL | 0 refills | Status: DC

## 2018-03-06 NOTE — Telephone Encounter (Signed)
Pt is calling back stating nobody called her back from yesterday, pt contact # [phone]

## 2018-03-06 NOTE — Telephone Encounter (Signed)
Talked to the patient and send a prescription of prednisone and Monistat. Patient should follow-up in the clinic for further evaluation, according to patient she does not has any transportation at this time and will try to come to the clinic as soon as can get some transportation, most likely next week.

## 2018-03-06 NOTE — Telephone Encounter (Signed)
Needs refills on prednisone and yeast cream, [facility], pt contact # [phone]

## 2018-03-11 ENCOUNTER — Other Ambulatory Visit: Payer: Self-pay | Admitting: Internal Medicine

## 2018-03-11 NOTE — Telephone Encounter (Signed)
Just gave her 1 prescription during her clinic visit last week. She has recurrence of her symptoms she should come to clinic for reevaluation.

## 2018-03-11 NOTE — Telephone Encounter (Signed)
Need refill on yeast infection cream @ [facility]; pt contact # [phone]

## 2018-03-12 NOTE — Telephone Encounter (Signed)
I do not see in her medlist, could you tell me what it is so I can ask when I call her

## 2018-03-12 NOTE — Telephone Encounter (Signed)
I give her Monistat for 1 application.

## 2018-03-13 ENCOUNTER — Ambulatory Visit (HOSPITAL_COMMUNITY)
Admission: EM | Admit: 2018-03-13 | Discharge: 2018-03-13 | Disposition: A | Discharge status: Home / Self Care | Payer: Medicare Other | Attending: Internal Medicine | Admitting: Internal Medicine

## 2018-03-13 ENCOUNTER — Encounter (HOSPITAL_COMMUNITY): Payer: Self-pay | Admitting: Emergency Medicine

## 2018-03-13 DIAGNOSIS — M1711 Unilateral primary osteoarthritis, right knee: Secondary | ICD-10-CM | POA: Insufficient documentation

## 2018-03-13 DIAGNOSIS — F319 Bipolar disorder, unspecified: Secondary | ICD-10-CM | POA: Diagnosis not present

## 2018-03-13 DIAGNOSIS — J45909 Unspecified asthma, uncomplicated: Secondary | ICD-10-CM | POA: Insufficient documentation

## 2018-03-13 DIAGNOSIS — Z880 Allergy status to penicillin: Secondary | ICD-10-CM | POA: Insufficient documentation

## 2018-03-13 DIAGNOSIS — N898 Other specified noninflammatory disorders of vagina: Secondary | ICD-10-CM | POA: Diagnosis not present

## 2018-03-13 DIAGNOSIS — E1140 Type 2 diabetes mellitus with diabetic neuropathy, unspecified: Secondary | ICD-10-CM | POA: Insufficient documentation

## 2018-03-13 DIAGNOSIS — B9689 Other specified bacterial agents as the cause of diseases classified elsewhere: Secondary | ICD-10-CM | POA: Insufficient documentation

## 2018-03-13 DIAGNOSIS — Z113 Encounter for screening for infections with a predominantly sexual mode of transmission: Secondary | ICD-10-CM

## 2018-03-13 DIAGNOSIS — K7460 Unspecified cirrhosis of liver: Secondary | ICD-10-CM | POA: Diagnosis not present

## 2018-03-13 DIAGNOSIS — Z794 Long term (current) use of insulin: Secondary | ICD-10-CM | POA: Insufficient documentation

## 2018-03-13 DIAGNOSIS — Z202 Contact with and (suspected) exposure to infections with a predominantly sexual mode of transmission: Secondary | ICD-10-CM

## 2018-03-13 DIAGNOSIS — Z7952 Long term (current) use of systemic steroids: Secondary | ICD-10-CM | POA: Insufficient documentation

## 2018-03-13 DIAGNOSIS — F17210 Nicotine dependence, cigarettes, uncomplicated: Secondary | ICD-10-CM | POA: Diagnosis not present

## 2018-03-13 DIAGNOSIS — E1165 Type 2 diabetes mellitus with hyperglycemia: Secondary | ICD-10-CM | POA: Diagnosis not present

## 2018-03-13 DIAGNOSIS — Z79899 Other long term (current) drug therapy: Secondary | ICD-10-CM | POA: Insufficient documentation

## 2018-03-13 DIAGNOSIS — J029 Acute pharyngitis, unspecified: Secondary | ICD-10-CM | POA: Diagnosis not present

## 2018-03-13 DIAGNOSIS — F419 Anxiety disorder, unspecified: Secondary | ICD-10-CM | POA: Insufficient documentation

## 2018-03-13 DIAGNOSIS — E7800 Pure hypercholesterolemia, unspecified: Secondary | ICD-10-CM | POA: Diagnosis not present

## 2018-03-13 DIAGNOSIS — I10 Essential (primary) hypertension: Secondary | ICD-10-CM | POA: Diagnosis not present

## 2018-03-13 DIAGNOSIS — Z886 Allergy status to analgesic agent status: Secondary | ICD-10-CM | POA: Diagnosis not present

## 2018-03-13 DIAGNOSIS — N760 Acute vaginitis: Secondary | ICD-10-CM | POA: Insufficient documentation

## 2018-03-13 DIAGNOSIS — M109 Gout, unspecified: Secondary | ICD-10-CM | POA: Insufficient documentation

## 2018-03-13 MED ORDER — LIDOCAINE HCL (PF) 1 % IJ SOLN
INTRAMUSCULAR | Status: AC
  Filled 2018-03-13: qty 2

## 2018-03-13 MED ORDER — METRONIDAZOLE 500 MG PO TABS: 500.0000 mg | ORAL_TABLET | Freq: Two times a day (BID) | ORAL | 0 refills | Status: DC

## 2018-03-13 MED ORDER — CEFTRIAXONE SODIUM 250 MG IJ SOLR
250.0000 mg | Freq: Once | INTRAMUSCULAR | Status: AC
  Administered 2018-03-13: 250 mg via INTRAMUSCULAR

## 2018-03-13 MED ORDER — AZITHROMYCIN 250 MG PO TABS: 1000.0000 mg | ORAL_TABLET | Freq: Once | ORAL | Status: DC

## 2018-03-13 MED ORDER — DOXYCYCLINE HYCLATE 100 MG PO CAPS: 100.0000 mg | ORAL_CAPSULE | Freq: Two times a day (BID) | ORAL | 0 refills | Status: AC

## 2018-03-13 MED ORDER — CEFTRIAXONE SODIUM 250 MG IJ SOLR
INTRAMUSCULAR | Status: AC
  Filled 2018-03-13: qty 250

## 2018-03-13 MED ORDER — FLUCONAZOLE 150 MG PO TABS: 150.0000 mg | ORAL_TABLET | Freq: Once | ORAL | 0 refills | Status: AC

## 2018-03-13 NOTE — Discharge Instructions (Addendum)
We have treated you today for gonorrhea and chlamydia.  We gave you a shot of Rocephin and 4 tablets of azithromycin.  Please refrain from sexual intercourse for the next 7 days.  I have also sent in Diflucan and metronidazole to treat you for yeast and BV.  Take 1 tablet of Diflucan today, may repeat at the end of course of antibiotics.Doxycycline twice daily for syphillis.  We are testing you for Gonorrhea, Chlamydia, Trichomonas, Yeast and Bacterial Vaginosis.We also drew blood to check syphillis. We will call you if anything is positive and let you know if you require any further treatment. Please inform partners of any positive results.   Please return if symptoms not improving with treatment, development of fever, nausea, vomiting, abdominal pain.

## 2018-03-13 NOTE — ED Triage Notes (Signed)
Pt states her partner told her she needed to be checked. Pt c/o vaginal itching and states "we did oral and my throat is sore".

## 2018-03-13 NOTE — Telephone Encounter (Signed)
Pt seen in ED today-empirically treated for  gonorrhea and chlamydia with a Rocephin injection and 4 tablets of azithromycin.  Rx for Diflucan and metronidazole were also given in ED.Kingsley SpittleGoldston, Darlene Cassady7/31/20192:25 PM
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
## 2018-03-14 LAB — RPR: RPR: NONREACTIVE

## 2018-03-15 LAB — CERVICOVAGINAL ANCILLARY ONLY
Bacterial vaginitis: NEGATIVE
Candida vaginitis: POSITIVE — AB
Chlamydia: NEGATIVE
Neisseria Gonorrhea: NEGATIVE
Trichomonas: NEGATIVE

## 2018-03-15 LAB — CULTURE, GROUP A STREP (THRC)

## 2018-03-18 ENCOUNTER — Ambulatory Visit: Payer: Medicare Other

## 2018-03-26 ENCOUNTER — Other Ambulatory Visit (HOSPITAL_COMMUNITY)
Admission: RE | Admit: 2018-03-26 | Discharge: 2018-03-26 | Disposition: A | Discharge status: Home / Self Care | Payer: Medicare Other | Source: Ambulatory Visit | Attending: Internal Medicine | Admitting: Internal Medicine

## 2018-03-26 ENCOUNTER — Ambulatory Visit (INDEPENDENT_AMBULATORY_CARE_PROVIDER_SITE_OTHER): Payer: Medicare Other | Admitting: Internal Medicine

## 2018-03-26 ENCOUNTER — Other Ambulatory Visit: Payer: Self-pay

## 2018-03-26 VITALS — BP 96/66 | HR 99 | Temp 98.9°F | Ht 61.0 in | Wt 178.9 lb

## 2018-03-26 DIAGNOSIS — E118 Type 2 diabetes mellitus with unspecified complications: Secondary | ICD-10-CM

## 2018-03-26 DIAGNOSIS — Z7251 High risk heterosexual behavior: Secondary | ICD-10-CM

## 2018-03-26 DIAGNOSIS — R300 Dysuria: Secondary | ICD-10-CM | POA: Diagnosis not present

## 2018-03-26 DIAGNOSIS — F17210 Nicotine dependence, cigarettes, uncomplicated: Secondary | ICD-10-CM | POA: Diagnosis not present

## 2018-03-26 DIAGNOSIS — Z794 Long term (current) use of insulin: Secondary | ICD-10-CM

## 2018-03-26 DIAGNOSIS — Z8742 Personal history of other diseases of the female genital tract: Secondary | ICD-10-CM

## 2018-03-26 LAB — GLUCOSE, CAPILLARY: Glucose-Capillary: 240 mg/dL — ABNORMAL HIGH (ref 70–99)

## 2018-03-26 NOTE — Patient Instructions (Signed)
Ms. Yetta BarreJones,  It was a pleasure taking care of you here at the clinic.  I am checking you today for gonorrhea, chlamydia, syphilis and urinary tract infection.   I would like for you to return to the clinic in 1 to 2 weeks.   ~Dr. Dortha SchwalbeAgyei

## 2018-03-26 NOTE — Progress Notes (Signed)
   CC: " Check for STD"  HPI:  Hannah Young is a 52 y.o. with medical history as listed below presenting today for STD check after unprotected sexual intercourse.  Patient was engaged in unprotected sexual intercourse 2 weeks ago and presented to urgent care on 03/13/2018 with complaints of vaginal itching.  She was worried that she had an STD and was tested for gonorrhea, chlamydia, BV, trichomoniasis, Candida and syphilis.  She was empirically treated for gonorrhea, chlamydia, syphilis and vaginal candidiasis with Rocephin, azithromycin, Diflucan, metronidazole and doxycycline.  Since that visit patient has had 1 additional unprotected sexual intercourse 3 days ago with the same partner and presenting today at the clinic filament and shows that she might have contracted sexually transmitted disease as she continues to have ongoing vaginal itching and dysuria.  She denies any discharge, fevers or chills.  Dysuria: Reports of unprotected sexual intercourse (vaginal and oral) and is anxious that she has an STD.  After her first unprotected sexual intercourse 2 weeks ago, she presented to the urgent care and was tested for GC, chlamydia, syphilis, BV, trichomoniasis and Candida.  The only positive finding was vaginal candidiasis on wet mount.  Has been empirically treated for gonorrhea, chlamydia, syphilis and vaginal candidiasis with Rocephin, azithromycin, Diflucan, metronidazole and doxycycline.  3 days ago, had unprotected sex with same partner and presented today requesting STD work-up.  On further research, the intubation.  For gonorrhea is 1 to 14 days and Chlamydia 7 to 21 days however given patient's desire and reassurance, I will go ahead and order urine NAAT GC, chlamydia and serum RPR.   -Follow-up urine NAAT GC and chlamydia -Follow-up serum RPR -Follow-up urinalysis -MIGHT require repeat STD screening in 2 weeks (urine, throat, vaginal)    Past Medical History:  Diagnosis Date  .  Anxiety   . Arthritis   . Asthma   . Bipolar 1 disorder ([facility])   . Breast lump   . Cirrhosis ([facility])   . Depression   . Diabetes mellitus   . FH: chemotherapy   . Gout   . Hepatitis B infection   . High cholesterol   . Hyperglycemia   . Hypertension   . Liver cirrhosis ([facility])   . Lung nodules   . Neuropathy   . Panic disorder   . Vertigo    Review of Systems: As per HPI  Physical Exam:  Vitals:   03/26/18 1031  BP: 96/66  Pulse: 99  Temp: 98.9 F (37.2 C)  TempSrc: Oral  SpO2: 100%  Weight: 178 lb 14.4 oz (81.1 kg)  Height: 5\' 1"  (1.549 m)   Constitutional: Anxious appearing female Cardiovascular: Normal S1 and S2, no murmurs, gallops, rubs. Respiratory: Clear to auscultation bilaterally.  No wheezes, crackles, rhonchi Abdomen: Bowel sounds present, nondistended, nontender to palpation  Assessment & Plan:   See Encounters Tab for problem based charting.  Patient discussed with Dr. Rogelia BogaButcher

## 2018-03-26 NOTE — Assessment & Plan Note (Signed)
Dysuria: Reports of unprotected sexual intercourse (vaginal and oral) and is anxious that she has an STD.  After her first unprotected sexual intercourse 2 weeks ago, she presented to the urgent care and was tested for GC, chlamydia, syphilis, BV, trichomoniasis and Candida.  The only positive finding was vaginal candidiasis on wet mount.  Has been empirically treated for gonorrhea, chlamydia, syphilis and vaginal candidiasis with Rocephin, azithromycin, Diflucan, metronidazole and doxycycline.  3 days ago, had unprotected sex with same partner and presented today requesting STD work-up.  On further research, the intubation.  For gonorrhea is 1 to 14 days and Chlamydia 7 to 21 days however given patient's desire and reassurance, I will go ahead and order urine NAAT GC, chlamydia and serum RPR.    -Follow-up urine NAAT GC and chlamydia -Follow-up serum RPR -Follow-up urinalysis -MIGHT require repeat STD screening in 2 weeks (urine, throat, vaginal)

## 2018-03-27 ENCOUNTER — Telehealth: Payer: Self-pay | Admitting: Internal Medicine

## 2018-03-27 LAB — URINALYSIS, COMPLETE
Bilirubin, UA: NEGATIVE
Leukocytes, UA: NEGATIVE
NITRITE UA: NEGATIVE
PROTEIN UA: NEGATIVE
RBC, UA: NEGATIVE
Specific Gravity, UA: >=1.03 — AB (ref 1.005–1.030)
Urobilinogen, Ur: 0.2 mg/dL (ref 0.2–1.0)
pH, UA: 5.5 (ref 5.0–7.5)

## 2018-03-27 LAB — MICROSCOPIC EXAMINATION: Casts: NONE SEEN /lpf

## 2018-03-27 LAB — URINE CYTOLOGY ANCILLARY ONLY
CHLAMYDIA, DNA PROBE: NEGATIVE
Neisseria Gonorrhea: NEGATIVE

## 2018-03-27 LAB — RPR: RPR Ser Ql: NONREACTIVE

## 2018-03-27 NOTE — Progress Notes (Signed)
Internal Medicine Clinic Attending  I saw and evaluated the patient.  I personally confirmed the key portions of the history and exam documented by Dr. Dortha SchwalbeAgyei and I reviewed pertinent patient test results.  The assessment, diagnosis, and plan were formulated together and I agree with the documentation in the resident's note. Ms Yetta BarreJones presents within 1-2 weeks (only 3 days) of unprotected oral and vaginal sex. Guidelines state empirical tx for gonorrhea and chlamydia. However, she had unprotected sex about 2 weeks ago with the same man, was empirically tx at that time, and had negative testing for gonorrhea and chlamydia. The risks of repeat ABX outweigh the benefits. Due to pt anxiety, we will retest (urine). However, as Dr Dortha SchwalbeAgyei noted, the incubation time makes a FP possible. Pt will need re-tested with throat and urine for  gonorrhea and chlamydia and HIV Ab in a couple of weeks.

## 2018-03-27 NOTE — Telephone Encounter (Signed)
I called patient to discuss her lab results.  RPR was negative, UA was positive for calcium oxalate crystals however patient denies hematuria or flank pain with radiation to her groin.  I have advised her to increase her fluid intake and will reassess when she sees me back at the clinic 2 weeks.   She was glad to know the reassuring negative syphilis.  Her urine GC and chlamydia still pending.

## 2018-03-28 ENCOUNTER — Encounter: Payer: Self-pay | Admitting: Family Medicine

## 2018-03-28 ENCOUNTER — Encounter: Payer: Self-pay | Admitting: Obstetrics and Gynecology

## 2018-03-28 ENCOUNTER — Encounter: Payer: Medicare Other | Admitting: Obstetrics and Gynecology

## 2018-03-28 NOTE — Addendum Note (Signed)
Addended by: Neomia DearPOWERS, Merrilee Ancona E on: 03/28/2018 03:43 PM   Modules accepted: Orders

## 2018-03-29 NOTE — Progress Notes (Signed)
Patient did not keep her GYN referral appointment for 03/28/2018.  Hannah Young, Jr MD Attending [facility](Faculty Practice)

## 2018-04-02 ENCOUNTER — Telehealth: Payer: Self-pay | Admitting: Internal Medicine

## 2018-04-02 ENCOUNTER — Other Ambulatory Visit: Payer: Self-pay | Admitting: Internal Medicine

## 2018-04-02 NOTE — Telephone Encounter (Signed)
Pt is wanted Dr Dortha SchwalbeAgyei to call her back regarding GC and Chalymidia results. She is wanting to make her follow-up appt but she is unsure what she is coming in for. Pls call pt [phone]

## 2018-04-03 NOTE — Telephone Encounter (Signed)
Hi Hannah Young,   I called the patient last week to discuss her results but I have called her again this morning.   Thank you

## 2018-04-04 ENCOUNTER — Other Ambulatory Visit: Payer: Self-pay | Admitting: Oncology

## 2018-04-04 ENCOUNTER — Other Ambulatory Visit: Payer: Self-pay | Admitting: Internal Medicine

## 2018-04-04 ENCOUNTER — Other Ambulatory Visit: Payer: Self-pay | Admitting: *Deleted

## 2018-04-04 ENCOUNTER — Telehealth: Payer: Self-pay | Admitting: Internal Medicine

## 2018-04-04 MED ORDER — PREDNISONE 20 MG PO TABS: 20.0000 mg | ORAL_TABLET | Freq: Every day | ORAL | 0 refills | Status: DC

## 2018-04-04 NOTE — Telephone Encounter (Signed)
Needs refill on predniSONE (DELTASONE) 20 MG tablet @ [facility]; pt contact# [phone]

## 2018-04-04 NOTE — Telephone Encounter (Addendum)
GI appt  Scheduled  -APPT WITH [facility] ON 04/25/2018 @ 10AM

## 2018-04-04 NOTE — Telephone Encounter (Signed)
Pt is wanting to have order put in for another HIV test, she have unprotected sex last month. She would like to come in tomorrow. She would like the physician to call back; pt contact# [phone]

## 2018-04-04 NOTE — Telephone Encounter (Signed)
She is coming tomorrow that can be addressed during her visit.

## 2018-04-04 NOTE — Progress Notes (Signed)
52 year old woman with insulin-dependent diabetes admitted to the hospital in June with elevated liver functions felt to be secondary to autoimmune hepatitis.  She was put on prednisone 40 mg daily.  She has had a number of refills with no reevaluation.  No liver functions recorded since June 14.  Now automatic refill is requested. She was supposed to see a gastroenterologist for further evaluation.  She did not follow through. I am reluctant to continue high-dose prednisone and is diabetic.  Recent glucoses noted to be over 600.  Indication for long-term prednisone currently not clear. I am going to cut her dose down to 20 mg daily.  30-day supply.  Note sent to her primary care physician.  She needs to be reevaluated soon.  We will get her an appointment

## 2018-04-04 NOTE — Telephone Encounter (Signed)
What is the status of her referral to GI? This should be managed by GI for her autoimmune hepatitis.

## 2018-04-04 NOTE — Telephone Encounter (Signed)
APPT WITH [facility] ON 04/25/2018 @ 10AM

## 2018-04-05 NOTE — Telephone Encounter (Signed)
[facility]

## 2018-04-05 NOTE — Telephone Encounter (Signed)
Please schedule pt a f/u appt per Dr Nelson ChimesAmin. Thanks

## 2018-04-05 NOTE — Telephone Encounter (Signed)
Prednisone was refilled yesterday.

## 2018-04-05 NOTE — Telephone Encounter (Signed)
Per Epic, pt does not have any upcoming appts here at the clinic.

## 2018-04-10 NOTE — Telephone Encounter (Signed)
Patient has appt with PCP 04/29/2018. Hannah Young. Xiong Haidar, RN, BSN

## 2018-04-29 ENCOUNTER — Other Ambulatory Visit (HOSPITAL_COMMUNITY)
Admission: RE | Admit: 2018-04-29 | Discharge: 2018-04-29 | Disposition: A | Discharge status: Home / Self Care | Payer: Medicare Other | Source: Ambulatory Visit | Attending: Internal Medicine | Admitting: Internal Medicine

## 2018-04-29 ENCOUNTER — Encounter: Payer: Self-pay | Admitting: Internal Medicine

## 2018-04-29 ENCOUNTER — Other Ambulatory Visit: Payer: Self-pay

## 2018-04-29 ENCOUNTER — Ambulatory Visit: Payer: Medicare Other | Admitting: Pharmacist

## 2018-04-29 ENCOUNTER — Ambulatory Visit (INDEPENDENT_AMBULATORY_CARE_PROVIDER_SITE_OTHER): Payer: Medicare Other | Admitting: Internal Medicine

## 2018-04-29 VITALS — BP 127/80 | HR 86 | Temp 98.4°F | Ht 60.0 in | Wt 181.4 lb

## 2018-04-29 DIAGNOSIS — N840 Polyp of corpus uteri: Secondary | ICD-10-CM | POA: Diagnosis not present

## 2018-04-29 DIAGNOSIS — I10 Essential (primary) hypertension: Secondary | ICD-10-CM | POA: Diagnosis not present

## 2018-04-29 DIAGNOSIS — Z7251 High risk heterosexual behavior: Secondary | ICD-10-CM | POA: Diagnosis not present

## 2018-04-29 DIAGNOSIS — Z794 Long term (current) use of insulin: Secondary | ICD-10-CM

## 2018-04-29 DIAGNOSIS — Z8742 Personal history of other diseases of the female genital tract: Secondary | ICD-10-CM

## 2018-04-29 DIAGNOSIS — E118 Type 2 diabetes mellitus with unspecified complications: Secondary | ICD-10-CM | POA: Diagnosis not present

## 2018-04-29 DIAGNOSIS — Z7952 Long term (current) use of systemic steroids: Secondary | ICD-10-CM

## 2018-04-29 DIAGNOSIS — F17211 Nicotine dependence, cigarettes, in remission: Secondary | ICD-10-CM

## 2018-04-29 DIAGNOSIS — Z0000 Encounter for general adult medical examination without abnormal findings: Secondary | ICD-10-CM | POA: Insufficient documentation

## 2018-04-29 DIAGNOSIS — N760 Acute vaginitis: Secondary | ICD-10-CM

## 2018-04-29 DIAGNOSIS — K754 Autoimmune hepatitis: Secondary | ICD-10-CM

## 2018-04-29 DIAGNOSIS — B9689 Other specified bacterial agents as the cause of diseases classified elsewhere: Secondary | ICD-10-CM | POA: Diagnosis not present

## 2018-04-29 DIAGNOSIS — Z124 Encounter for screening for malignant neoplasm of cervix: Secondary | ICD-10-CM | POA: Diagnosis not present

## 2018-04-29 LAB — POCT GLYCOSYLATED HEMOGLOBIN (HGB A1C): Hemoglobin A1C: 12.5 % — AB (ref 4.0–5.6)

## 2018-04-29 LAB — GLUCOSE, CAPILLARY: Glucose-Capillary: 273 mg/dL — ABNORMAL HIGH (ref 70–99)

## 2018-04-29 MED ORDER — ONETOUCH VERIO W/DEVICE KIT: 1.0000 | PACK | Freq: Once | 0 refills | Status: AC

## 2018-04-29 MED ORDER — ONETOUCH VERIO W/DEVICE KIT: 1.0000 | PACK | Freq: Once | 0 refills | Status: DC

## 2018-04-29 MED ORDER — INSULIN DETEMIR 100 UNIT/ML ~~LOC~~ SOLN: 15.0000 [IU] | Freq: Every day | SUBCUTANEOUS | 11 refills | Status: DC

## 2018-04-29 MED ORDER — PREDNISONE 20 MG PO TABS: 20.0000 mg | ORAL_TABLET | Freq: Every day | ORAL | 2 refills | Status: DC

## 2018-04-29 MED ORDER — GLUCOSE BLOOD VI STRP: ORAL_STRIP | 12 refills | Status: DC

## 2018-04-29 MED ORDER — ACCU-CHEK AVIVA PLUS W/DEVICE KIT: 1.0000 | PACK | Freq: Once | 0 refills | Status: DC

## 2018-04-29 NOTE — Progress Notes (Signed)
Freestyle Libre Pro CGM sensor placed and started. Patient was educated about wearing sensor, keeping food, activity and medication log and when to call office. Follow up was arranged with the patient.   

## 2018-04-29 NOTE — Assessment & Plan Note (Signed)
BP Readings from Last 3 Encounters:  04/29/18 127/80  03/26/18 96/66  03/13/18 135/82   Her blood pressure was within goal today without any antihypertensives.  Continue monitoring.

## 2018-04-29 NOTE — Assessment & Plan Note (Addendum)
Patient still has not follow-up with GI, despite being told multiple times. She already missed her appointment 2-3 times, stating that there is a lot going around her in the house and she cannot make up with her appointments. Had a long discussion regarding a sequelae of autoimmune hepatitis and the need to be managed by a specialist.  Patient promised again that she will make an appointment and this time will not miss ???  Her prednisone was decreased to 20 mg daily. She denies any nausea, vomiting.  Occasionally gets right upper quadrant pain.  Refill for prednisone 20 mg daily was provided. -Repeat CMP today.  Addendum.  Transaminitis is improving.

## 2018-04-29 NOTE — Assessment & Plan Note (Signed)
Patient was referred to see a gynecologist for biopsy which she never followed. Patient she is postmenopausal now and no more get her cycle.  Re-advised her to follow-up with gynecologist.

## 2018-04-29 NOTE — Progress Notes (Signed)
Freestyle Libre Pro CGM sensor placed and started. Patient was educated about wearing sensor, keeping food, activity and medication log and when to call office. Follow up was arranged with the patient.   LOT [id number] C SN [id number] 11/12/18

## 2018-04-29 NOTE — Assessment & Plan Note (Signed)
She continued to experience hyperglycemia, as she is on chronic use of steroid and does not follow a diabetic diet. She do miss her insulin quite frequently. A1c today was 12.5, which was increased from 5.6 done in June 2019. She did bring her glucometer and it shows average reading of 425, low of 212 and multiple time of high above 600.  According to patient she is just taking Levemir 10 units at bedtime and missing quite a bit of dosing of NovoLog, whenever she takes it she will take 5 to 6 units. She was reluctant to double up her Levemir dose-which she needs.  -Increase Levemir to 15 units at bedtime. -Continue NovoLog 5 to 6 units with meals 3 times a day. -She was provided with CGM and we will adjust her dosage according to her hyperglycemic pattern. -Follow-up in 1 week for CGM download.

## 2018-04-29 NOTE — Patient Instructions (Addendum)
Please record the time, amount and what food drinks and activities you have while wearing the continuous glucose monitor(CGM) in the folder provided.  Bring the folder with you to follow up appointments  Do not have a CT or an MRI while wearing the CGM.   Please make an appointment for 1 week with me and a doctor for the first of two CGM downloads..   You will also return in 2 weeks to have your second download and the CGM removed.   As we discussed you will continue to take prednisone 20 mg daily. You will increase your Levemir at bedtime to 15 units. We will call you for any abnormal lab results. Please follow-up with your liver doctor and gynecologist. I am also giving you a referral to see a podiatrist. Follow-up in 1 week for your meter read.

## 2018-04-29 NOTE — Assessment & Plan Note (Addendum)
She continued to have unprotected sexual encounters with a female partner who she really thinks is cheating on her and keep demanding checking for STDs during last few clinic visits. She was once treated empirically for STDs in ED. RPR remain negative x3 in the past 1769-month. Does has an history of positive RPR and was treated with penicillin. On pelvic exam she was complaining of bilateral adnexal tenderness on deep palpation, and having a copious amount of yellowish watery discharge.  Addendum.  Her wet mount is positive for Gardnerella vaginalis. A prescription of Flagyl 500 mg twice daily for 7 days was sent to her pharmacy. Called the patient with no response.  Wet mount and testing for gonorrhea and chlamydia was sent. -Ordered HIV testing which never went through and patient left. -We will test her for HIV during next follow-up visit in 1 week.

## 2018-04-29 NOTE — Progress Notes (Signed)
   CC: For follow-up of her diabetes, autoimmune hepatitis.  HPI:  Ms.Hannah Noreene LarssonM Young is a 52 y.o. past medical history as listed below came to the clinic for follow-up of her diabetes and autoimmune hepatitis.  Patient wants to be tested for STDs, HIV and syphilis.  She continued to have unprotected sexual intercourse and really believe that her partner is cheating on her.  She was complaining of some vaginal discharge and itching.  Denies any genital ulceration or swollen lymph glands.  She denies any rash.  She denies any dyspareunia.  She denies any fever or chills. She is being tested for syphilis 3 times in the past 3245-month and found to be negative.  She did had once positive RPR and was treated with penicillin in the past.  Also asking to give her a referral to see a podiatrist-which was provided.  Please see assessment and plan for her chronic conditions.   Past Medical History:  Diagnosis Date  . Anxiety   . Arthritis   . Asthma   . Bipolar 1 disorder (HCC)   . Breast lump   . Cirrhosis (HCC)   . Depression   . Diabetes mellitus   . FH: chemotherapy   . Gout   . Hepatitis B infection   . High cholesterol   . Hyperglycemia   . Hypertension   . Liver cirrhosis (HCC)   . Lung nodules   . Neuropathy   . Panic disorder   . Vaginal discharge 01/25/2017  . Vertigo    Review of Systems: Negative except mentioned in HPI.  Physical Exam:  Vitals:   04/29/18 1336  BP: 127/80  Pulse: 86  Temp: 98.4 F (36.9 C)  TempSrc: Oral  SpO2: 100%  Weight: 181 lb 6.4 oz (82.3 kg)  Height: 5' (1.524 m)   General: Vital signs reviewed.  Patient is well-developed and well-nourished, in no acute distress and cooperative with exam.  Head: Normocephalic and atraumatic. Eyes: EOMI, conjunctivae normal, no scleral icterus.  Cardiovascular: RRR, S1 normal, S2 normal, no murmurs, gallops, or rubs. Pulmonary/Chest: Clear to auscultation bilaterally, no wheezes, rales, or  rhonchi. Abdominal: Soft, non-tender, non-distended, BS +, no masses, organomegaly, or guarding present. GU.  Friable cervix which easily bleeds during Pap smear, yellowish watery vaginal discharge, bilateral adnexal tenderness. Extremities: No lower extremity edema bilaterally,  pulses symmetric and intact bilaterally. No cyanosis or clubbing. Skin: Warm, dry and intact. No rashes or erythema. Psychiatric: Normal mood and affect. speech and behavior is normal. Cognition and memory are normal.  Assessment & Plan:   See Encounters Tab for problem based charting.  Patient discussed with Dr. Criselda PeachesMullen.

## 2018-04-29 NOTE — Assessment & Plan Note (Signed)
Pap smear was done today. 

## 2018-04-30 ENCOUNTER — Other Ambulatory Visit: Payer: Self-pay | Admitting: Internal Medicine

## 2018-04-30 ENCOUNTER — Telehealth: Payer: Self-pay | Admitting: Internal Medicine

## 2018-04-30 DIAGNOSIS — Z7251 High risk heterosexual behavior: Secondary | ICD-10-CM

## 2018-04-30 LAB — CMP14 + ANION GAP
ALT: 38 IU/L — AB (ref 0–32)
AST: 41 IU/L — ABNORMAL HIGH (ref 0–40)
Albumin/Globulin Ratio: 1.5 (ref 1.2–2.2)
Albumin: 4.2 g/dL (ref 3.5–5.5)
Alkaline Phosphatase: 44 IU/L (ref 39–117)
Anion Gap: 15 mmol/L (ref 10.0–18.0)
BILIRUBIN TOTAL: 0.4 mg/dL (ref 0.0–1.2)
BUN/Creatinine Ratio: 21 (ref 9–23)
BUN: 13 mg/dL (ref 6–24)
CALCIUM: 9.2 mg/dL (ref 8.7–10.2)
CHLORIDE: 98 mmol/L (ref 96–106)
CO2: 26 mmol/L (ref 20–29)
Creatinine, Ser: 0.61 mg/dL (ref 0.57–1.00)
GFR calc non Af Amer: 107 mL/min/{1.73_m2} (ref >59)
GFR, EST AFRICAN AMERICAN: 123 mL/min/{1.73_m2} (ref >59)
GLUCOSE: 306 mg/dL — AB (ref 65–99)
Globulin, Total: 2.8 g/dL (ref 1.5–4.5)
Potassium: 4.3 mmol/L (ref 3.5–5.2)
Sodium: 139 mmol/L (ref 134–144)
TOTAL PROTEIN: 7 g/dL (ref 6.0–8.5)

## 2018-04-30 LAB — CERVICOVAGINAL ANCILLARY ONLY
Bacterial vaginitis: POSITIVE — AB
CANDIDA VAGINITIS: NEGATIVE
CHLAMYDIA, DNA PROBE: NEGATIVE
NEISSERIA GONORRHEA: NEGATIVE
Trichomonas: NEGATIVE

## 2018-04-30 MED ORDER — METRONIDAZOLE 500 MG PO TABS: 500.0000 mg | ORAL_TABLET | Freq: Two times a day (BID) | ORAL | 0 refills | Status: AC

## 2018-04-30 NOTE — Addendum Note (Signed)
Addended by: Arnetha CourserAMIN, Elyn Krogh on: 04/30/2018 05:05 PM   Modules accepted: Orders

## 2018-04-30 NOTE — Telephone Encounter (Signed)
Pt seen yesterday in clinic requesting test Results.

## 2018-04-30 NOTE — Telephone Encounter (Signed)
HIV orders are in, she can come anytime for blood draw. She was found to have bacterial vaginosis and a prescription of Flagyl was sent to [facility] pharmacy. Tried calling patient twice with no response.

## 2018-04-30 NOTE — Telephone Encounter (Addendum)
Patient called in. CMP reviewed with patient. Explained other tests not back. Patient asked about HIV test. HIV test canceled yesterday 2/2 separate tube needed to be drawn and patient already left. Patient would like to come back this week for HIV test. Will walk in when able to find transportation. Front Office, Lab and PCP aware. Kinnie FeilL. Gavino Fouch, RN, BSN

## 2018-05-01 ENCOUNTER — Other Ambulatory Visit (INDEPENDENT_AMBULATORY_CARE_PROVIDER_SITE_OTHER): Payer: Medicare Other

## 2018-05-01 DIAGNOSIS — Z7251 High risk heterosexual behavior: Secondary | ICD-10-CM | POA: Diagnosis not present

## 2018-05-01 LAB — CYTOLOGY - PAP: DIAGNOSIS: NEGATIVE

## 2018-05-01 NOTE — Telephone Encounter (Signed)
Per dr Nelson Chimesamin called pt and gave her the message from dr Nelson Chimesamin, pt said ok, thank you, ask if lab appt could be scheduled she said no, ill call back

## 2018-05-01 NOTE — Telephone Encounter (Signed)
Requesting lab results. Please call pt back.  

## 2018-05-02 ENCOUNTER — Telehealth: Payer: Self-pay | Admitting: Internal Medicine

## 2018-05-02 LAB — HIV ANTIBODY (ROUTINE TESTING W REFLEX): HIV Screen 4th Generation wRfx: NONREACTIVE

## 2018-05-02 NOTE — Telephone Encounter (Signed)
Patient made aware of normal pap and STD tests including HIV. States she's already received treatment for BV. Hannah Young. [place], RN, BSN

## 2018-05-02 NOTE — Telephone Encounter (Signed)
PATIENT WOULD LIKE CALL BACK FROM LAB WORK

## 2018-05-03 ENCOUNTER — Telehealth: Payer: Self-pay | Admitting: Internal Medicine

## 2018-05-03 NOTE — Telephone Encounter (Signed)
Pt states she is allergic to Latex and wants to know what else can she use to protect herself during sex.

## 2018-05-03 NOTE — Telephone Encounter (Signed)
I believe there's non-latex condoms but will ask her doctor also.

## 2018-05-06 ENCOUNTER — Encounter: Payer: Medicare Other | Admitting: Internal Medicine

## 2018-05-06 ENCOUNTER — Ambulatory Visit: Payer: Medicare Other

## 2018-05-06 ENCOUNTER — Ambulatory Visit: Payer: Medicare Other | Admitting: Pharmacist

## 2018-05-06 ENCOUNTER — Encounter: Payer: Medicare Other | Admitting: Dietician

## 2018-05-07 NOTE — Progress Notes (Signed)
Internal Medicine Clinic Attending  Case discussed with Dr. Amin at the time of the visit.  We reviewed the resident's history and exam and pertinent patient test results.  I agree with the assessment, diagnosis, and plan of care documented in the resident's note.    

## 2018-05-13 ENCOUNTER — Ambulatory Visit: Payer: Medicare Other

## 2018-05-13 ENCOUNTER — Encounter: Payer: Medicare Other | Admitting: Dietician

## 2018-05-14 ENCOUNTER — Ambulatory Visit: Payer: Medicare Other | Admitting: Pharmacist

## 2018-05-30 ENCOUNTER — Encounter: Payer: Self-pay | Admitting: *Deleted

## 2018-06-17 ENCOUNTER — Other Ambulatory Visit: Payer: Self-pay | Admitting: Internal Medicine

## 2018-06-17 ENCOUNTER — Ambulatory Visit: Payer: Medicare Other | Admitting: Podiatry

## 2018-06-17 DIAGNOSIS — K754 Autoimmune hepatitis: Secondary | ICD-10-CM

## 2018-06-19 ENCOUNTER — Ambulatory Visit: Payer: Medicare Other

## 2018-06-25 ENCOUNTER — Telehealth: Payer: Self-pay | Admitting: Dietician

## 2018-06-25 NOTE — Telephone Encounter (Signed)
Told her that I would put a sample contour next meter at the front desk for her to pick up.

## 2018-06-26 ENCOUNTER — Ambulatory Visit: Payer: Medicare Other

## 2018-06-27 ENCOUNTER — Other Ambulatory Visit: Payer: Self-pay

## 2018-06-27 ENCOUNTER — Encounter: Payer: Self-pay | Admitting: Internal Medicine

## 2018-06-27 ENCOUNTER — Ambulatory Visit (INDEPENDENT_AMBULATORY_CARE_PROVIDER_SITE_OTHER): Payer: Medicare Other | Admitting: Internal Medicine

## 2018-06-27 VITALS — BP 116/82 | HR 82 | Temp 98.0°F | Ht 61.0 in | Wt 179.5 lb

## 2018-06-27 DIAGNOSIS — M170 Bilateral primary osteoarthritis of knee: Secondary | ICD-10-CM | POA: Diagnosis not present

## 2018-06-27 DIAGNOSIS — E119 Type 2 diabetes mellitus without complications: Secondary | ICD-10-CM

## 2018-06-27 DIAGNOSIS — Z720 Tobacco use: Secondary | ICD-10-CM | POA: Diagnosis not present

## 2018-06-27 DIAGNOSIS — K7460 Unspecified cirrhosis of liver: Secondary | ICD-10-CM

## 2018-06-27 DIAGNOSIS — K754 Autoimmune hepatitis: Secondary | ICD-10-CM | POA: Diagnosis not present

## 2018-06-27 DIAGNOSIS — M25561 Pain in right knee: Secondary | ICD-10-CM

## 2018-06-27 DIAGNOSIS — G8929 Other chronic pain: Secondary | ICD-10-CM | POA: Diagnosis not present

## 2018-06-27 DIAGNOSIS — B1920 Unspecified viral hepatitis C without hepatic coma: Secondary | ICD-10-CM | POA: Diagnosis not present

## 2018-06-27 DIAGNOSIS — Z7952 Long term (current) use of systemic steroids: Secondary | ICD-10-CM | POA: Diagnosis not present

## 2018-06-27 DIAGNOSIS — Z794 Long term (current) use of insulin: Secondary | ICD-10-CM | POA: Diagnosis not present

## 2018-06-27 DIAGNOSIS — M25562 Pain in left knee: Secondary | ICD-10-CM

## 2018-06-27 MED ORDER — PREDNISONE 20 MG PO TABS: 20.0000 mg | ORAL_TABLET | Freq: Every day | ORAL | 2 refills | Status: DC

## 2018-06-27 MED ORDER — TRIAMCINOLONE ACETONIDE 40 MG/ML IJ SUSP: 40.0000 mg | Freq: Once | INTRAMUSCULAR | Status: DC

## 2018-06-27 NOTE — Patient Instructions (Signed)
Thank you for allowing us to provide your care today. Today we discussed your knee pain, diabetes, and liver disease    We have given you a steroid shot in your knee, this should take effect later today and should last for a few months.  We have refilled your prednisone, please make sure to follow up with the gastroenterologist to have your liver disease addressed.  Please continue to take your diabetes medications as follows: Levemir 15 units every night, Novolog 5-6 units with meals. Try to eat smaller meals more frequently and don't skin meals.  Check your blood sugars about 4 times a day.  Please follow up in 2 weeks so that we can re-check your diabetes.   Please follow-up in 2 weeks.    Should you have any questions or concerns please call the internal medicine clinic at [phone].

## 2018-06-27 NOTE — Progress Notes (Signed)
CC: Left knee pain  HPI:  [name] is a 52 y.o.  with a PMH listed below presenting for left knee pain. She has a history of osteoarthritis of her knees and has had steroid injections in the past. She reports that the pain is a sharp pain, has a "crunching" sensation, that is worse with standing walking upstairs.  She is in a apartment that has multiple steps which caused her pain.  She reports that she has seen orthopedics in the past and had a steroid injection about 4 to 5 months ago, she reports that she is not able to return to the clinic and needs a referral for another orthopedic surgeon.  She states that she was told that they would not do surgery due to her uncontrolled diabetes.  On exam she does tenderness on the medial aspect of her left knee, as well as some crepitus and tenderness of her right knee.   In regards to her diabetes, she is supposed to be taking 15 units daily and NovoLog 5 to 6 units with meals.  She reports that she does not have a meter at the moment and has not been checking her blood sugar levels, she has been taking her insulin based on how she is feeling. Yesterday she only took 5 units of 1 of the insulins because she did not have an appetite or eat anything later in the day.  Her last A1c was 12.5.  She also reports that his numbness and tingling in her legs.   In regards to her hepatitis she reports that she has an appointment with the GI doctor.  She states that she has transportation issues and should be able to make her appointment.  She reports that she has been having some increased early satiety and decrease in appetite.  She requested a refill on prednisone.  Advised patient of the importance of following up with the GI doctor to have her hepatitis managed.  Please see A&P for status of the patient's chronic medical conditions  Past Medical History:  Diagnosis Date  . Anxiety   . Arthritis   . Asthma   . Bipolar 1 disorder (HCC)   . Breast  lump   . Cirrhosis (HCC)   . Depression   . Diabetes mellitus   . FH: chemotherapy   . Gout   . Hepatitis B infection   . High cholesterol   . Hyperglycemia   . Hypertension   . Liver cirrhosis (HCC)   . Lung nodules   . Neuropathy   . Panic disorder   . Vaginal discharge 01/25/2017  . Vertigo    Review of Systems: Refer to history of present illness and assessment and plans for pertinent review of systems, all others reviewed and negative.  Physical Exam:  There were no vitals filed for this visit.  Physical Exam  Constitutional: She is oriented to person, place, and time and well-developed, well-nourished, and in no distress.  HENT:  Head: Normocephalic and atraumatic.  Eyes: Pupils are equal, round, and reactive to light. Conjunctivae and EOM are normal.  Neck: Normal range of motion. Neck supple. No thyromegaly present.  Cardiovascular: Normal rate, regular rhythm and normal heart sounds. Exam reveals no gallop and no friction rub.  No murmur heard. Pulmonary/Chest: Effort normal and breath sounds normal. No respiratory distress. She has no wheezes.  Abdominal: Soft. Bowel sounds are normal. She exhibits no distension.  Musculoskeletal: Normal range of motion. She exhibits tenderness. She  exhibits no edema.  Left knee: Tenderness on medial aspect of left knee, no erythema or edema noted, no crepitus  Right knee: Minimal tenderness over right knee, no erythema or edema noted, minimal crepitus with extension and flexion  Neurological: She is alert and oriented to person, place, and time. Gait normal.  Skin: Skin is warm and dry. No erythema.  Psychiatric: Mood and affect normal.    Social History   Socioeconomic History  . Marital status: Legally Separated    Spouse name: Not on file  . Number of children: Not on file  . Years of education: Not on file  . Highest education level: Not on file  Occupational History  . Not on file  Social Needs  . Financial resource  strain: Not on file  . Food insecurity:    Worry: Not on file    Inability: Not on file  . Transportation needs:    Medical: Not on file    Non-medical: Not on file  Tobacco Use  . Smoking status: Former Smoker    Packs/day: 0.25    Types: Cigarettes    Last attempt to quit: 02/26/2018    Years since quitting: 0.3  . Smokeless tobacco: Never Used  . Tobacco comment: 4 cigs  per day  Substance and Sexual Activity  . Alcohol use: Not Currently    Alcohol/week: 3.0 standard drinks    Types: 3 Cans of beer per week  . Drug use: No  . Sexual activity: Yes    Birth control/protection: Surgical  Lifestyle  . Physical activity:    Days per week: Not on file    Minutes per session: Not on file  . Stress: Not on file  Relationships  . Social connections:    Talks on phone: Not on file    Gets together: Not on file    Attends religious service: Not on file    Active member of club or organization: Not on file    Attends meetings of clubs or organizations: Not on file    Relationship status: Not on file  . Intimate partner violence:    Fear of current or ex partner: Not on file    Emotionally abused: Not on file    Physically abused: Not on file    Forced sexual activity: Not on file  Other Topics Concern  . Not on file  Social History Narrative  . Not on file    Family History  Problem Relation Age of Onset  . Colon cancer Neg Hx   . Stomach cancer Neg Hx     Assessment & Plan:   See Encounters Tab for problem based charting.  Patient seen with Dr. Criselda PeachesMullen

## 2018-06-28 ENCOUNTER — Encounter: Payer: Medicare Other | Admitting: Family Medicine

## 2018-06-28 NOTE — Assessment & Plan Note (Signed)
In regards to her hepatitis she reports that she has an appointment with the GI doctor.  She states that she has transportation issues and should be able to make her appointment.  She reports that she has been having some increased early satiety and decrease in appetite.  She requested a refill on prednisone.  Advised patient of the importance of following up with the GI doctor to have her hepatitis managed.  -Refill prednisone -Advised patient to follow-up with the GI

## 2018-06-28 NOTE — Assessment & Plan Note (Signed)
In regards to her diabetes, she is supposed to be taking 15 units daily and NovoLog 5 to 6 units with meals.  She reports that she does not have a meter at the moment and has not been checking her blood sugar levels, she has been taking her insulin based on how she is feeling. Yesterday she only took 5 units of 1 of the insulins because she did not have an appetite or eat anything later in the day.  Her last A1c was 12.5.  She also reports that his numbness and tingling in her legs.   Patient had a CGM placed in September and was supposed to follow-up 1 week however she reports that the CGM was knocked out of her arm so she did not follow up.  Discussed the importance of taking her insulin and checking her blood sugar levels especially since she is on chronic steroids significantly elevated A1c on her last visit.  She reported that she is more concerned about her health now and wants to work on getting her blood sugars under control.  Plan: -Patient was given glucometer to check up to twice a day -Advised to always take the Levemir 15 units at night and check sugars before meals to see how much NovoLog she needs, either 5 to 6 units -Advised to consume small meals and to not skip meals

## 2018-06-28 NOTE — Progress Notes (Signed)
Patient ID: Hannah BellowsShirley M Standley, female   DOB: 05-Jan-1966, 52 y.o.   MRN: [id number]  PROCEDURE NOTE  PROCEDURE: left knee joint steroid injection.  PREOPERATIVE DIAGNOSIS: Osteoarthritis of the bilateral left knee.  POSTOPERATIVE DIAGNOSIS: Osteoarthritis of the bilateral knee.  PROCEDURE: The patient was apprised of the risks and the benefits of the procedure and informed consent was obtained, as witnessed by Skip EstimableStacee Chaplin, RN. Time-out procedure was performed, with confirmation of the patient's name, date of birth, and correct identification of the left knee to be injected. The patient's knee was then marked at the appropriate site for injection placement. The knee was sterilely prepped with Betadine. A 40 mg (1 milliliter) solution of Kenalog was drawn up into a 5 mL syringe with a 2 mL of 1% lidocaine. The patient was injected with a 25-gauge needle at the medial aspect of her left flexed knee. There were no complications. The patient tolerated the procedure well. There was minimal bleeding. The patient was instructed to ice her knee upon leaving clinic and refrain from overuse over the next 3 days. The patient was instructed to go to the emergency room with any usual pain, swelling, or redness occurred in the injected area. The patient was given a followup appointment to evaluate response to the injection to his increased range of motion and reduction of pain.  The procedure was supervised by attending physician, Dr. Criselda PeachesMullen.

## 2018-06-28 NOTE — Assessment & Plan Note (Signed)
She reports that the pain is a sharp pain, has a "crunching" sensation, that is worse with standing walking upstairs.  She is in a apartment that has multiple steps which caused her pain.  She reports that she has seen orthopedics in the past and had a steroid injection about 4 to 5 months ago, she reports that she is not able to return to the clinic and needs a referral for another orthopedic surgeon.  She states that she was told that they would not do surgery due to her uncontrolled diabetes.  On exam she does tenderness on the medial aspect of her left knee, as well as some crepitus and tenderness of her right knee.   We discussed that since her diabetes is uncontrolled and that she has Hep C with cirrhosis we should try to get these under control before getting her to an orthopedic surgeon for surgery. We discussed that we can provide steroid injections here.  Plan: -Steroid injection given in left knee -Can repeat injections every 3 months as needed -The need orthopedic referral.

## 2018-07-02 ENCOUNTER — Telehealth: Payer: Self-pay | Admitting: Internal Medicine

## 2018-07-02 ENCOUNTER — Ambulatory Visit: Payer: Medicare Other | Admitting: Podiatry

## 2018-07-02 NOTE — Telephone Encounter (Signed)
Pt would like nurse to call she has a question, [phone]

## 2018-07-02 NOTE — Telephone Encounter (Signed)
Returned call to patient. States she is still having vaginal discharge and [facility] Does not want to get undressed at that appt and is wondering if mouth swab can be done instead of vaginal swab or if she can do vaginal swab herself. states she has done vaginal swab herself in past at urgent care. [facility]. Kinnie FeilL. Elmus Mathes, RN, BSN

## 2018-07-03 NOTE — Progress Notes (Signed)
Internal Medicine Clinic Attending  I saw and evaluated the patient.  I personally confirmed the key portions of the history and exam documented by Dr. Gwyneth RevelsKrienke and I reviewed pertinent patient test results.  The assessment, diagnosis, and plan were formulated together and I agree with the documentation in the resident's note.      I was present for the procedure and assisted Dr. Gwyneth RevelsKrienke.

## 2018-07-04 ENCOUNTER — Ambulatory Visit: Payer: Medicare Other

## 2018-07-04 NOTE — Telephone Encounter (Signed)
Per chart review, [facility] and [facility]21[facility].Criss AlvineGoldston, Claudie Brickhouse Cassady11/21/20199:31 AM

## 2018-07-05 ENCOUNTER — Encounter: Payer: Medicare Other | Admitting: Obstetrics and Gynecology

## 2018-07-14 ENCOUNTER — Ambulatory Visit (HOSPITAL_COMMUNITY)
Admission: EM | Admit: 2018-07-14 | Discharge: 2018-07-14 | Disposition: A | Discharge status: Home / Self Care | Payer: Medicare Other | Attending: Family Medicine | Admitting: Family Medicine

## 2018-07-14 ENCOUNTER — Encounter (HOSPITAL_COMMUNITY): Payer: Self-pay

## 2018-07-14 DIAGNOSIS — M1711 Unilateral primary osteoarthritis, right knee: Secondary | ICD-10-CM | POA: Insufficient documentation

## 2018-07-14 DIAGNOSIS — Z7251 High risk heterosexual behavior: Secondary | ICD-10-CM

## 2018-07-14 DIAGNOSIS — Z881 Allergy status to other antibiotic agents status: Secondary | ICD-10-CM | POA: Diagnosis not present

## 2018-07-14 DIAGNOSIS — F419 Anxiety disorder, unspecified: Secondary | ICD-10-CM | POA: Diagnosis not present

## 2018-07-14 DIAGNOSIS — Z880 Allergy status to penicillin: Secondary | ICD-10-CM | POA: Diagnosis not present

## 2018-07-14 DIAGNOSIS — Z202 Contact with and (suspected) exposure to infections with a predominantly sexual mode of transmission: Secondary | ICD-10-CM | POA: Diagnosis not present

## 2018-07-14 DIAGNOSIS — F319 Bipolar disorder, unspecified: Secondary | ICD-10-CM | POA: Diagnosis not present

## 2018-07-14 DIAGNOSIS — J45909 Unspecified asthma, uncomplicated: Secondary | ICD-10-CM | POA: Diagnosis not present

## 2018-07-14 DIAGNOSIS — N939 Abnormal uterine and vaginal bleeding, unspecified: Secondary | ICD-10-CM | POA: Diagnosis not present

## 2018-07-14 DIAGNOSIS — Z7952 Long term (current) use of systemic steroids: Secondary | ICD-10-CM | POA: Insufficient documentation

## 2018-07-14 DIAGNOSIS — E1140 Type 2 diabetes mellitus with diabetic neuropathy, unspecified: Secondary | ICD-10-CM | POA: Diagnosis not present

## 2018-07-14 DIAGNOSIS — Z794 Long term (current) use of insulin: Secondary | ICD-10-CM | POA: Diagnosis not present

## 2018-07-14 DIAGNOSIS — R300 Dysuria: Secondary | ICD-10-CM

## 2018-07-14 DIAGNOSIS — E11649 Type 2 diabetes mellitus with hypoglycemia without coma: Secondary | ICD-10-CM | POA: Insufficient documentation

## 2018-07-14 DIAGNOSIS — N941 Unspecified dyspareunia: Secondary | ICD-10-CM | POA: Diagnosis not present

## 2018-07-14 DIAGNOSIS — Z886 Allergy status to analgesic agent status: Secondary | ICD-10-CM | POA: Diagnosis not present

## 2018-07-14 DIAGNOSIS — Z711 Person with feared health complaint in whom no diagnosis is made: Secondary | ICD-10-CM

## 2018-07-14 DIAGNOSIS — Z79899 Other long term (current) drug therapy: Secondary | ICD-10-CM | POA: Insufficient documentation

## 2018-07-14 DIAGNOSIS — Z8619 Personal history of other infectious and parasitic diseases: Secondary | ICD-10-CM | POA: Diagnosis not present

## 2018-07-14 DIAGNOSIS — I10 Essential (primary) hypertension: Secondary | ICD-10-CM | POA: Diagnosis not present

## 2018-07-14 DIAGNOSIS — Z87891 Personal history of nicotine dependence: Secondary | ICD-10-CM | POA: Insufficient documentation

## 2018-07-14 LAB — POCT URINALYSIS DIP (DEVICE)
Bilirubin Urine: NEGATIVE
Glucose, UA: >=1000 mg/dL — AB
Ketones, ur: NEGATIVE mg/dL
Nitrite: NEGATIVE
PROTEIN: NEGATIVE mg/dL
SPECIFIC GRAVITY, URINE: 1.02 (ref 1.005–1.030)
Urobilinogen, UA: 0.2 mg/dL (ref 0.0–1.0)
pH: 5.5 (ref 5.0–8.0)

## 2018-07-14 MED ORDER — AZITHROMYCIN 250 MG PO TABS
1000.0000 mg | ORAL_TABLET | Freq: Once | ORAL | Status: AC
  Administered 2018-07-14: 1000 mg via ORAL

## 2018-07-14 MED ORDER — AZITHROMYCIN 250 MG PO TABS
ORAL_TABLET | ORAL | Status: AC
  Filled 2018-07-14: qty 4

## 2018-07-14 MED ORDER — CEFTRIAXONE SODIUM 250 MG IJ SOLR
INTRAMUSCULAR | Status: AC
  Filled 2018-07-14: qty 250

## 2018-07-14 MED ORDER — FLUCONAZOLE 150 MG PO TABS: 150.0000 mg | ORAL_TABLET | Freq: Every day | ORAL | 0 refills | Status: DC

## 2018-07-14 MED ORDER — LIDOCAINE HCL (PF) 1 % IJ SOLN
INTRAMUSCULAR | Status: AC
  Filled 2018-07-14: qty 2

## 2018-07-14 MED ORDER — CEFTRIAXONE SODIUM 250 MG IJ SOLR
250.0000 mg | Freq: Once | INTRAMUSCULAR | Status: AC
  Administered 2018-07-14: 250 mg via INTRAMUSCULAR

## 2018-07-14 NOTE — ED Notes (Signed)
Pt refused to put on a gown, she prefer to do a self swab for STD screening.
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
## 2018-07-14 NOTE — Discharge Instructions (Addendum)
We are testing you for STDs. We will go ahead and treat you in clinic today for possibility of gonorrhea and chlamydia I am sending medicine sent to the pharmacy for yeast infection.  You can go ahead and start that today Your lab results are pending and we will call with any positive results

## 2018-07-14 NOTE — ED Triage Notes (Signed)
Pt present vaginal bleeding for over a month. Pt states it is very painful for them to have intercourse.   The discharge is yellowish/pinky

## 2018-07-15 LAB — CERVICOVAGINAL ANCILLARY ONLY
Bacterial vaginitis: POSITIVE — AB
Candida vaginitis: NEGATIVE
Chlamydia: NEGATIVE
Neisseria Gonorrhea: NEGATIVE
TRICH (WINDOWPATH): POSITIVE — AB

## 2018-07-15 LAB — HIV ANTIBODY (ROUTINE TESTING W REFLEX): HIV Screen 4th Generation wRfx: NONREACTIVE

## 2018-07-16 ENCOUNTER — Ambulatory Visit: Payer: Medicare Other | Admitting: Podiatry

## 2018-07-16 ENCOUNTER — Telehealth (HOSPITAL_COMMUNITY): Payer: Self-pay | Admitting: Emergency Medicine

## 2018-07-16 LAB — RPR: RPR Ser Ql: NONREACTIVE

## 2018-07-16 MED ORDER — METRONIDAZOLE 500 MG PO TABS: 500.0000 mg | ORAL_TABLET | Freq: Two times a day (BID) | ORAL | 0 refills | Status: DC

## 2018-07-16 NOTE — Telephone Encounter (Signed)
Bacterial vaginosis is positive. This was not treated at the urgent care visit.  Flagyl 500 mg BID x 7 days #14 no refills sent to patients pharmacy of choice.    Trichomonas is positive. Rx  for Flagyl 500mg   BID q 7 days, once was sent to the pharmacy of record. Pt needs education to refrain from sexual intercourse for 7 days to give the medicine time to work. Sexual partners need to be notified and tested/treated. Condoms may reduce risk of reinfection. Recheck for further evaluation if symptoms are not improving.  Pt verbalized understanding

## 2018-07-18 ENCOUNTER — Encounter: Payer: Medicare Other | Admitting: Obstetrics and Gynecology

## 2018-07-18 ENCOUNTER — Other Ambulatory Visit: Payer: Self-pay | Admitting: *Deleted

## 2018-07-18 MED ORDER — OMEPRAZOLE 40 MG PO CPDR: 40.0000 mg | DELAYED_RELEASE_CAPSULE | Freq: Every day | ORAL | 2 refills | Status: DC

## 2018-07-23 ENCOUNTER — Ambulatory Visit: Payer: Medicare Other

## 2018-07-24 ENCOUNTER — Ambulatory Visit: Payer: Medicare Other

## 2018-07-24 ENCOUNTER — Encounter: Payer: Self-pay | Admitting: Internal Medicine

## 2018-07-26 ENCOUNTER — Other Ambulatory Visit: Payer: Self-pay

## 2018-07-26 ENCOUNTER — Ambulatory Visit (INDEPENDENT_AMBULATORY_CARE_PROVIDER_SITE_OTHER): Payer: Medicare Other | Admitting: Internal Medicine

## 2018-07-26 ENCOUNTER — Encounter: Payer: Self-pay | Admitting: Internal Medicine

## 2018-07-26 VITALS — BP 113/81 | HR 94 | Temp 98.4°F | Ht 61.0 in | Wt 177.3 lb

## 2018-07-26 DIAGNOSIS — R05 Cough: Secondary | ICD-10-CM

## 2018-07-26 DIAGNOSIS — R059 Cough, unspecified: Secondary | ICD-10-CM | POA: Insufficient documentation

## 2018-07-26 DIAGNOSIS — Z8619 Personal history of other infectious and parasitic diseases: Secondary | ICD-10-CM | POA: Diagnosis not present

## 2018-07-26 DIAGNOSIS — A64 Unspecified sexually transmitted disease: Secondary | ICD-10-CM | POA: Insufficient documentation

## 2018-07-26 DIAGNOSIS — F17211 Nicotine dependence, cigarettes, in remission: Secondary | ICD-10-CM

## 2018-07-26 DIAGNOSIS — N939 Abnormal uterine and vaginal bleeding, unspecified: Secondary | ICD-10-CM | POA: Diagnosis not present

## 2018-07-26 DIAGNOSIS — R6883 Chills (without fever): Secondary | ICD-10-CM | POA: Diagnosis not present

## 2018-07-26 DIAGNOSIS — R093 Abnormal sputum: Secondary | ICD-10-CM

## 2018-07-26 MED ORDER — BENZONATATE 100 MG PO CAPS: 100.0000 mg | ORAL_CAPSULE | Freq: Three times a day (TID) | ORAL | 0 refills | Status: DC | PRN

## 2018-07-26 MED ORDER — DEXTROMETHORPHAN-GUAIFENESIN 10-100 MG/5ML PO LIQD: 5.0000 mL | ORAL | 0 refills | Status: DC | PRN

## 2018-07-26 MED ORDER — OSELTAMIVIR PHOSPHATE 75 MG PO CAPS: 75.0000 mg | ORAL_CAPSULE | Freq: Every day | ORAL | 0 refills | Status: AC

## 2018-07-26 NOTE — Progress Notes (Signed)
   CC: cough  HPI:  [name] is a 52 y.o. female with history noted below that presents to the acute care clinic for cough.  Please see problem based charting for the status of patient's chronic medical conditions.   Past Medical History:  Diagnosis Date  . Anxiety   . Arthritis   . Asthma   . Bipolar 1 disorder (HCC)   . Breast lump   . Cirrhosis (HCC)   . Depression   . Diabetes mellitus   . FH: chemotherapy   . Gout   . Hepatitis B infection   . High cholesterol   . Hyperglycemia   . Hypertension   . Liver cirrhosis (HCC)   . Lung nodules   . Neuropathy   . Panic disorder   . Vaginal discharge 01/25/2017  . Vertigo     Review of Systems:  Review of Systems  Constitutional: Positive for chills. Negative for fever.  Respiratory: Positive for cough and sputum production.      Physical Exam:  Vitals:   07/26/18 1029  BP: 113/81  Pulse: 94  Temp: 98.4 F (36.9 C)  TempSrc: Oral  SpO2: 100%  Weight: 177 lb 4.8 oz (80.4 kg)  Height: 5\' 1"  (1.549 m)   Physical Exam  Constitutional: She is well-developed, well-nourished, and in no distress.  HENT:  Mouth/Throat: No oropharyngeal exudate.  Cardiovascular: Normal rate, regular rhythm and normal heart sounds. Exam reveals no gallop and no friction rub.  No murmur heard. Pulmonary/Chest: Effort normal and breath sounds normal. No respiratory distress. She has no wheezes. She has no rales.  Musculoskeletal:        General: No edema.  Skin: Skin is warm and dry.     Assessment & Plan:   See encounters tab for problem based medical decision making.   Patient discussed with Dr. Cleda DaubE. Daphnie Venturini

## 2018-07-26 NOTE — Patient Instructions (Signed)
[name],  It is a pleasure meeting you today. A referral to OB/GYN has been made for your bleeding. Please start taking Tessalon Perles for your cough

## 2018-07-26 NOTE — Assessment & Plan Note (Signed)
HPI: Patient reports vaginal bleeding on and off for the past 2 months. She reports associated pain.  She states the amount of bleeding waxes and wanes. There are times she spots and at other times she has to use sanitary pads.  She states she continues to have monthly periods but this is different from her usual periods.   Assessment: Vaginal bleeding Patient was seen in the ED for this issue on 12/1 and had STD check and treated prophylactically for gonorrhea and chlamydia.  Results from that encounter showed she was positive for BV and Trichomonas and she completed a one-week course of Flagyl.  Patient needs to be evaluated by OB/GYN for her vaginal bleeding.  Patient had an appointment with OB/GYN on 12/5 and did not show. She states she is going to reschedule. I will place another referral.  I recommended ibuprofen for a short course to help with the pain.  Plan -OB/GYN referral -Ibuprofen for 1 week

## 2018-07-26 NOTE — Assessment & Plan Note (Addendum)
History of present illness:  Patient has has a cough with yellow productive sputum with chills for the past week. She states she's been around sick contacts including people that have been positive for flu. She denies myalgias or fever.  She has not tried anything for her symptoms.  Assessment:  Cough Likely a viral URI.  She is afebrile with clear lung sounds. At this time will provide Tessalon Perles and dextromorphan to help with cough and give Tamiflu for postexposure prophylaxis.  Plan -Tessalon Perles - Robitussin DM -Tamiflu 75 mg once a day for 10 days

## 2018-07-26 NOTE — Assessment & Plan Note (Addendum)
Assessment: History of STI Patient was positive for trichomonas on 07/14/2017 she started her Flagyl course several days after prescription was written.   Recommendations include to retest patients for known trichomonas at least 7 days after antibiotic completion. Patient states that around 12/5 is when she started the medication. She has refrained from intercourse since the start of medication. She can get retested after 12/19 at the earliest.  Plan -retest for trichomonas on follow-up visit

## 2018-07-29 NOTE — Progress Notes (Signed)
Internal Medicine Clinic Attending  Case discussed with Dr. Danijah Noh at the time of the visit.  We reviewed the resident's history and exam and pertinent patient test results.  I agree with the assessment, diagnosis, and plan of care documented in the resident's note.  

## 2018-07-30 ENCOUNTER — Telehealth: Payer: Self-pay

## 2018-07-30 NOTE — Telephone Encounter (Signed)
Requesting to speak with a nurse about med. Please call pt back.  

## 2018-07-30 NOTE — Telephone Encounter (Signed)
Not sure what cough syrup is covered by insurance for medicare.  Dr. Selena BattenKim do you know?

## 2018-07-30 NOTE — Telephone Encounter (Signed)
Returned call to patient. States meds sent on 07/26/2018 were not at [facility]. Spoke with Calton Dachlements at [facility]. States insurance will not cover [employer] Perles or Robitussin. Denies receiving Rx for Tamiflu. [facility]. States PA is required for tessalon but most times PA is denied. Patient notified and is requesting something else for cough that will be covered. Kinnie FeilL. Deigo Alonso, RN, BSN

## 2018-07-31 ENCOUNTER — Telehealth: Payer: Self-pay | Admitting: *Deleted

## 2018-07-31 NOTE — Telephone Encounter (Signed)
Call to [facility] for PA for Benzonatate or an alternative that is covered by the plan.  PA was denied as no medication for Cough is covered by Medicare Part D plans.  Message to be sent to Dr. Mila HomerJ. Hoffman.  Angelina OkGladys Armend Hochstatter, RN 07/31/2018 11:31 AM.

## 2018-08-01 NOTE — Telephone Encounter (Signed)
Hi Dr. Mikey BussingHoffman, sometimes the cough medications are not covered, but I wonder if patient could pay out of pocket. The tessalon is around $8 at [employer] and the Robitussin would be a few dollars OTC. I tried calling patient to discuss, but unable to reach. Please let me know if further help needed.

## 2018-08-01 NOTE — Telephone Encounter (Signed)
Hi Lauren,  Could you please let patient know that tessalon is around $8 at [employer] and the Robitussin would be a few dollars OTC thanks.

## 2018-08-15 NOTE — Addendum Note (Signed)
Addended by: Neomia Dear on: 08/15/2018 05:51 PM   Modules accepted: Orders

## 2018-08-21 ENCOUNTER — Other Ambulatory Visit: Payer: Self-pay | Admitting: Internal Medicine

## 2018-08-21 DIAGNOSIS — K754 Autoimmune hepatitis: Secondary | ICD-10-CM

## 2018-08-22 ENCOUNTER — Encounter: Payer: Self-pay | Admitting: Family Medicine

## 2018-08-22 NOTE — Telephone Encounter (Signed)
I am just refilling for 1 month. Patient needs to be seen by a hepatologist as we advised her multiple time.

## 2018-10-03 ENCOUNTER — Encounter: Payer: Medicare Other | Admitting: Obstetrics & Gynecology

## 2018-10-03 NOTE — Addendum Note (Signed)
Addended by: Neomia Dear on: 10/03/2018 03:43 PM   Modules accepted: Orders

## 2018-10-14 ENCOUNTER — Encounter (HOSPITAL_COMMUNITY): Payer: Self-pay | Admitting: *Deleted

## 2018-10-14 ENCOUNTER — Emergency Department (HOSPITAL_COMMUNITY)
Admission: EM | Admit: 2018-10-14 | Discharge: 2018-10-14 | Disposition: A | Discharge status: Home / Self Care | Payer: No Typology Code available for payment source | Attending: Emergency Medicine | Admitting: Emergency Medicine

## 2018-10-14 DIAGNOSIS — Z794 Long term (current) use of insulin: Secondary | ICD-10-CM | POA: Insufficient documentation

## 2018-10-14 DIAGNOSIS — M791 Myalgia, unspecified site: Secondary | ICD-10-CM | POA: Insufficient documentation

## 2018-10-14 DIAGNOSIS — M542 Cervicalgia: Secondary | ICD-10-CM | POA: Diagnosis not present

## 2018-10-14 DIAGNOSIS — I10 Essential (primary) hypertension: Secondary | ICD-10-CM | POA: Insufficient documentation

## 2018-10-14 DIAGNOSIS — E119 Type 2 diabetes mellitus without complications: Secondary | ICD-10-CM | POA: Insufficient documentation

## 2018-10-14 DIAGNOSIS — Z79899 Other long term (current) drug therapy: Secondary | ICD-10-CM | POA: Insufficient documentation

## 2018-10-14 DIAGNOSIS — M7918 Myalgia, other site: Secondary | ICD-10-CM

## 2018-10-14 DIAGNOSIS — Z87891 Personal history of nicotine dependence: Secondary | ICD-10-CM | POA: Diagnosis not present

## 2018-10-14 DIAGNOSIS — J45909 Unspecified asthma, uncomplicated: Secondary | ICD-10-CM | POA: Insufficient documentation

## 2018-10-14 DIAGNOSIS — M25511 Pain in right shoulder: Secondary | ICD-10-CM | POA: Diagnosis not present

## 2018-10-14 MED ORDER — METHOCARBAMOL 500 MG PO TABS: 500.0000 mg | ORAL_TABLET | Freq: Two times a day (BID) | ORAL | 0 refills | Status: DC
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
## 2018-10-14 NOTE — Discharge Instructions (Signed)
Please read attached information. If you experience any new or worsening signs or symptoms please return to the emergency room for evaluation. Please follow-up with your primary care provider or specialist as discussed. Please use medication prescribed only as directed and discontinue taking if you have any concerning signs or symptoms.   °

## 2018-10-18 ENCOUNTER — Ambulatory Visit: Payer: Medicare Other

## 2018-10-22 ENCOUNTER — Other Ambulatory Visit: Payer: Self-pay | Admitting: Internal Medicine

## 2018-10-22 ENCOUNTER — Ambulatory Visit: Payer: Medicare Other

## 2018-10-31 ENCOUNTER — Other Ambulatory Visit: Payer: Self-pay | Admitting: Internal Medicine

## 2018-10-31 DIAGNOSIS — K754 Autoimmune hepatitis: Secondary | ICD-10-CM

## 2018-11-01 NOTE — Telephone Encounter (Signed)
She needs to follow-up with a specialist which she is not doing. I will refill this time because of current unusual circumstances, I am very uncomfortable to keeping her on this dose of steroid for many months.

## 2018-11-04 ENCOUNTER — Other Ambulatory Visit: Payer: Self-pay | Admitting: Internal Medicine

## 2018-11-04 NOTE — Telephone Encounter (Signed)
Has been sent to pharmacy 

## 2018-11-04 NOTE — Telephone Encounter (Signed)
Refill Request   predniSONE (DELTASONE) 20 MG tablet  [facility] - [place], [place] - [address]

## 2018-11-07 ENCOUNTER — Telehealth: Payer: Self-pay

## 2018-11-07 NOTE — Telephone Encounter (Signed)
Requesting to speak with a nurse about ring worm on the elbow. Please call pt back.

## 2018-11-12 NOTE — Telephone Encounter (Signed)
Called pt - no answer; phone continued to ring, no vm.

## 2018-12-03 ENCOUNTER — Other Ambulatory Visit: Payer: Self-pay | Admitting: Internal Medicine

## 2018-12-03 ENCOUNTER — Encounter: Payer: Self-pay | Admitting: Oncology

## 2018-12-03 ENCOUNTER — Other Ambulatory Visit: Payer: Self-pay

## 2018-12-03 DIAGNOSIS — K754 Autoimmune hepatitis: Secondary | ICD-10-CM

## 2018-12-03 NOTE — Addendum Note (Signed)
Addended by: Hassan Buckler on: 12/03/2018 02:42 PM   Modules accepted: Orders

## 2018-12-03 NOTE — Progress Notes (Signed)
Electronic request for prescription refill on 20 mg of prednisone daily for "autoimmune hepatitis".  I could find no documentation of this diagnosis in the chart looking back many years.  I could not find any lab test that corroborate this diagnosis.  Mild liver functions noted September 2019 not repeated since then.  Last clinic visit December 2019 and this issue was not addressed. I will not refill this prescription.  Patient needs to be reevaluated.  I will copy this note to her primary care physician.

## 2018-12-03 NOTE — Telephone Encounter (Signed)
Mamie confirmed with GI that patient no showed appts on 04/25/2018 and 08/16/2018 to f/u on autoimmune hepatitis. Kinnie Feil, RN, BSN

## 2018-12-03 NOTE — Telephone Encounter (Signed)
predniSONE (DELTASONE) 20 MG tablet, refill request @  [facility] - [place], [place] - [address] [phone] (Phone) [phone] (Fax)

## 2018-12-05 ENCOUNTER — Telehealth: Payer: Self-pay | Admitting: *Deleted

## 2018-12-05 NOTE — Telephone Encounter (Signed)
Diarrhea, 1 week ago, abd pain, h/a x 2 weeks, feet are so dry they are cracking, possible std- 2/11 was last sexual activity Pt stated since her refill of prednisone was refused she would just come in and get checked for everything she could thinks of that she could have wrong with her. She had at first stated she would have to have private transportation provided by the clinic to insure that she would not get "the corona" that she could not ride a bus because she might get "the corona" Does she need a telehealth appt or does she need to come to clinic?

## 2018-12-05 NOTE — Telephone Encounter (Signed)
If we are worried about an STD I would ask her to come in.

## 2018-12-06 ENCOUNTER — Encounter: Payer: Self-pay | Admitting: Internal Medicine

## 2018-12-06 ENCOUNTER — Other Ambulatory Visit: Payer: Self-pay | Admitting: Internal Medicine

## 2018-12-06 DIAGNOSIS — K754 Autoimmune hepatitis: Secondary | ICD-10-CM

## 2018-12-06 NOTE — Progress Notes (Signed)
Please see my letter dated 24 April.  She has 2 weeks to call us from receipt of the letter in order to remain a patient at [facility]

## 2018-12-09 ENCOUNTER — Other Ambulatory Visit: Payer: Self-pay | Admitting: Internal Medicine

## 2018-12-09 DIAGNOSIS — Z794 Long term (current) use of insulin: Secondary | ICD-10-CM

## 2018-12-09 DIAGNOSIS — E118 Type 2 diabetes mellitus with unspecified complications: Secondary | ICD-10-CM

## 2018-12-10 NOTE — Telephone Encounter (Signed)
Called pt - informed if she's concern about having a STD to schedule an appt to be seen; stated she has been waiting on a return call from [place] b/c she wasn't sure if we were seeing pt . She does not want to schedule an appt until she has transportation b/c she does not want to ride the bus d/t covid.

## 2018-12-25 ENCOUNTER — Telehealth: Payer: Self-pay | Admitting: Licensed Clinical Social Worker

## 2018-12-25 NOTE — Telephone Encounter (Signed)
Patient was contacted to provide information for transportation services. Patient did not answer, and a voicemail was left providing a phone number to use in order for the patient to schedule transportation.

## 2019-01-01 ENCOUNTER — Ambulatory Visit: Payer: Medicare Other

## 2019-01-28 ENCOUNTER — Encounter (HOSPITAL_COMMUNITY): Payer: Self-pay | Admitting: Emergency Medicine

## 2019-01-28 ENCOUNTER — Ambulatory Visit (HOSPITAL_COMMUNITY)
Admission: EM | Admit: 2019-01-28 | Discharge: 2019-01-28 | Disposition: A | Discharge status: Home / Self Care | Payer: Medicare Other | Attending: Emergency Medicine | Admitting: Emergency Medicine

## 2019-01-28 ENCOUNTER — Other Ambulatory Visit: Payer: Self-pay | Admitting: Internal Medicine

## 2019-01-28 ENCOUNTER — Other Ambulatory Visit: Payer: Self-pay

## 2019-01-28 DIAGNOSIS — B3731 Acute candidiasis of vulva and vagina: Secondary | ICD-10-CM

## 2019-01-28 DIAGNOSIS — E118 Type 2 diabetes mellitus with unspecified complications: Secondary | ICD-10-CM

## 2019-01-28 DIAGNOSIS — B373 Candidiasis of vulva and vagina: Secondary | ICD-10-CM | POA: Insufficient documentation

## 2019-01-28 DIAGNOSIS — Z794 Long term (current) use of insulin: Secondary | ICD-10-CM

## 2019-01-28 LAB — POCT URINALYSIS DIP (DEVICE)
Bilirubin Urine: NEGATIVE
Glucose, UA: NEGATIVE mg/dL
Hgb urine dipstick: NEGATIVE
Ketones, ur: NEGATIVE mg/dL
Leukocytes,Ua: NEGATIVE
Nitrite: NEGATIVE
Protein, ur: NEGATIVE mg/dL
Specific Gravity, Urine: 1.025 (ref 1.005–1.030)
Urobilinogen, UA: 0.2 mg/dL (ref 0.0–1.0)
pH: 5.5 (ref 5.0–8.0)

## 2019-01-28 MED ORDER — FLUCONAZOLE 200 MG PO TABS: 200.0000 mg | ORAL_TABLET | Freq: Once | ORAL | 0 refills | Status: AC
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
## 2019-01-28 NOTE — ED Triage Notes (Signed)
Pant has vaginal discharge for 1.5 weeks.  Patient is requesting to be checked for std

## 2019-01-28 NOTE — Discharge Instructions (Signed)
Take Diflucan today, may repeat in 3 days if still having symptoms. We will call you with your test results, and treat if indicated.

## 2019-01-28 NOTE — Telephone Encounter (Signed)
Next appt scheduled 6/29 with PCP. 

## 2019-01-29 ENCOUNTER — Telehealth: Payer: Self-pay

## 2019-01-29 LAB — CERVICOVAGINAL ANCILLARY ONLY
Bacterial vaginitis: NEGATIVE
Chlamydia: NEGATIVE
Neisseria Gonorrhea: NEGATIVE
Trichomonas: NEGATIVE

## 2019-01-29 LAB — RPR: RPR Ser Ql: NONREACTIVE

## 2019-01-29 LAB — HIV ANTIBODY (ROUTINE TESTING W REFLEX): HIV Screen 4th Generation wRfx: NONREACTIVE

## 2019-01-29 NOTE — Telephone Encounter (Signed)
Requesting test results. Please call pt back.  

## 2019-01-30 ENCOUNTER — Other Ambulatory Visit: Payer: Self-pay | Admitting: *Deleted

## 2019-01-30 DIAGNOSIS — E118 Type 2 diabetes mellitus with unspecified complications: Secondary | ICD-10-CM

## 2019-01-30 DIAGNOSIS — Z794 Long term (current) use of insulin: Secondary | ICD-10-CM

## 2019-01-30 MED ORDER — INSULIN DETEMIR 100 UNIT/ML ~~LOC~~ SOLN: 15.0000 [IU] | Freq: Every day | SUBCUTANEOUS | 11 refills | Status: DC

## 2019-02-10 ENCOUNTER — Ambulatory Visit (INDEPENDENT_AMBULATORY_CARE_PROVIDER_SITE_OTHER): Payer: Medicare Other | Admitting: Internal Medicine

## 2019-02-10 ENCOUNTER — Encounter: Payer: Self-pay | Admitting: Internal Medicine

## 2019-02-10 ENCOUNTER — Other Ambulatory Visit: Payer: Self-pay

## 2019-02-10 VITALS — BP 137/74 | HR 81 | Temp 98.8°F | Ht 61.0 in | Wt 213.6 lb

## 2019-02-10 DIAGNOSIS — M170 Bilateral primary osteoarthritis of knee: Secondary | ICD-10-CM

## 2019-02-10 DIAGNOSIS — Z79899 Other long term (current) drug therapy: Secondary | ICD-10-CM | POA: Diagnosis not present

## 2019-02-10 DIAGNOSIS — Z87891 Personal history of nicotine dependence: Secondary | ICD-10-CM

## 2019-02-10 DIAGNOSIS — Z23 Encounter for immunization: Secondary | ICD-10-CM | POA: Diagnosis not present

## 2019-02-10 DIAGNOSIS — Z0000 Encounter for general adult medical examination without abnormal findings: Secondary | ICD-10-CM

## 2019-02-10 DIAGNOSIS — I10 Essential (primary) hypertension: Secondary | ICD-10-CM

## 2019-02-10 DIAGNOSIS — Z794 Long term (current) use of insulin: Secondary | ICD-10-CM | POA: Diagnosis not present

## 2019-02-10 DIAGNOSIS — E119 Type 2 diabetes mellitus without complications: Secondary | ICD-10-CM

## 2019-02-10 DIAGNOSIS — K754 Autoimmune hepatitis: Secondary | ICD-10-CM

## 2019-02-10 DIAGNOSIS — Z1239 Encounter for other screening for malignant neoplasm of breast: Secondary | ICD-10-CM

## 2019-02-10 DIAGNOSIS — G8929 Other chronic pain: Secondary | ICD-10-CM | POA: Diagnosis not present

## 2019-02-10 LAB — POCT GLYCOSYLATED HEMOGLOBIN (HGB A1C): Hemoglobin A1C: 6.9 % — AB (ref 4.0–5.6)

## 2019-02-10 LAB — GLUCOSE, CAPILLARY: Glucose-Capillary: 124 mg/dL — ABNORMAL HIGH (ref 70–99)

## 2019-02-10 MED ORDER — LISINOPRIL 10 MG PO TABS: 10.0000 mg | ORAL_TABLET | Freq: Every day | ORAL | 11 refills | Status: DC

## 2019-02-10 MED ORDER — MELOXICAM 7.5 MG PO TABS: 7.5000 mg | ORAL_TABLET | Freq: Every day | ORAL | 2 refills | Status: DC

## 2019-02-10 NOTE — Assessment & Plan Note (Signed)
Her bilateral knee and ankle pain and left hip pain were more consistent with osteoarthritis. POCUS was negative for any joint effusion. She was asking for steroid injections in both of her knees which were provided.  -Give her a prescription of Mobic 7.5 mg daily as needed for joint pains.  Procedure note. Knee steroid injection Procedure Note  Diagnosis: bilateral knees  Indications: Osteoarthritis  Anesthesia: Lidocaine 1% without epinephrine  Procedure Details    Consent was obtained for the procedure. The joint was prepped with Betadine. A 25 gauge needle was inserted into the superior aspect of the joint from a medial approach to access the suprapatellar pouch. 2 ml 1% lidocaine and 1 ml of Triamcinolone was then injected into the joint . The needle was removed and the area cleansed and dressed.  Complications:  None; patient tolerated the procedure well.

## 2019-02-10 NOTE — Assessment & Plan Note (Signed)
Patient has not seen a gastroenterologist yet. She was tapered off from prednisone for about 61-month now.  Denies any nausea, vomiting or abdominal pain.  -Discussed again with her to see her gastroenterologist for further management of her autoimmune hepatitis.

## 2019-02-10 NOTE — Progress Notes (Signed)
Internal Medicine Clinic Attending  I saw and evaluated the patient.  I personally confirmed the key portions of the history and exam documented by Dr. Reesa Chew and I reviewed pertinent patient test results.  The assessment, diagnosis, and plan were formulated together and I agree with the documentation in the resident's note.   I was present for both injections.

## 2019-02-10 NOTE — Patient Instructions (Addendum)
Thank you for visiting clinic today. Please make an follow-up appointment with your gastroenterologist. You are doing well with your diabetes, keep up the good work. I am also restarting lisinopril 10 mg daily today, please take it as instructed and follow-up in 1 month for your blood pressure check. I am also giving you a steroid injection in both of your knees today. Please check your blood sugar regularly as steroid injections can increase your blood sugar. Follow-up in 1 month for your blood pressure check. Bring your glucometer with you during your next follow-up appointment.  Knee Injection A knee injection is a procedure to get medicine into your knee joint to relieve the pain, swelling, and stiffness of arthritis. Your health care provider uses a needle to inject medicine, which may also help to lubricate and cushion your knee joint. You may need more than one injection. Tell a health care provider about:  Any allergies you have.  All medicines you are taking, including vitamins, herbs, eye drops, creams, and over-the-counter medicines.  Any problems you or family members have had with anesthetic medicines.  Any blood disorders you have.  Any surgeries you have had.  Any medical conditions you have.  Whether you are pregnant or may be pregnant. What are the risks? Generally, this is a safe procedure. However, problems may occur, including:  Infection.  Bleeding.  Symptoms that get worse.  Damage to the area around your knee.  Allergic reaction to any of the medicines.  Skin reactions from repeated injections. What happens before the procedure?  Ask your health care provider about changing or stopping your regular medicines. This is especially important if you are taking diabetes medicines or blood thinners.  Plan to have someone take you home from the hospital or clinic. What happens during the procedure?   You will sit or lie down in a position for your knee  to be treated.  The skin over your kneecap will be cleaned with a germ-killing soap.  You will be given a medicine that numbs the area (local anesthetic). You may feel some stinging.  The medicine will be injected into your knee. The needle is carefully placed between your kneecap and your knee. The medicine is injected into the joint space.  The needle will be removed at the end of the procedure.  A bandage (dressing) may be placed over the injection site. The procedure may vary among health care providers and hospitals. What can I expect after the procedure?  Your blood pressure, heart rate, breathing rate, and blood oxygen level will be monitored until you leave the hospital or clinic.  You may have to move your knee through its full range of motion. This helps to get all the medicine into your joint space.  You will be watched to make sure that you do not have a reaction to the injected medicine.  You may feel more pain, swelling, and warmth than you did before the injection. This reaction may last about 1-2 days. Follow these instructions at home: Medicines  Take over-the-counter and prescription medicines only as told by your doctor.  Do not drive or use heavy machinery while taking prescription pain medicine.  Do not take medicines such as aspirin and ibuprofen unless your health care provider tells you to take them. Injection site care  Follow instructions from your health care provider about: ? How to take care of your puncture site. ? When and how you should change your dressing. ? When you should  remove your dressing.  Check your injection area every day for signs of infection. Check for: ? More redness, swelling, or pain after 2 days. ? Fluid or blood. ? Pus or a bad smell. ? Warmth. Managing pain, stiffness, and swelling   If directed, put ice on the injection area: ? Put ice in a plastic bag. ? Place a towel between your skin and the bag. ? Leave the ice  on for 20 minutes, 2-3 times per day.  Do not apply heat to your knee.  Raise (elevate) the injection area above the level of your heart while you are sitting or lying down. General instructions  If you were given a dressing, keep it dry until your health care provider says it can be removed. Ask your health care provider when you can start showering or taking a bath.  Avoid strenuous activities for as long as directed by your health care provider. Ask your health care provider when you can return to your normal activities.  Keep all follow-up visits as told by your health care provider. This is important. You may need more injections. Contact a health care provider if you have:  A fever.  Warmth in your injection area.  Fluid, blood, or pus coming from your injection site.  Symptoms at your injection site that last longer than 2 days after your procedure. Get help right away if:  Your knee: ? Turns very red. ? Becomes very swollen. ? Is in severe pain. Summary  A knee injection is a procedure to get medicine into your knee joint to relieve the pain, swelling, and stiffness of arthritis.  A needle is carefully placed between your kneecap and your knee to inject medicine into the joint space.  Before the procedure, ask your health care provider about changing or stopping your regular medicines, especially if you are taking diabetes medicines or blood thinners.  Contact your health care provider if you have any problems or questions after your procedure. This information is not intended to replace advice given to you by your health care provider. Make sure you discuss any questions you have with your health care provider. Document Released: 10/22/2006 Document Revised: 08/20/2017 Document Reviewed: 08/20/2017 Elsevier Patient Education  2020 [employer]

## 2019-02-10 NOTE — Assessment & Plan Note (Signed)
Patient did not bring her glucometer today, per patient she is compliant with her Levemir 15 units at bedtime and using NovoLog according to sliding scale. She denies any hypoglycemic event.  Her A1c improved to 6.9 today from previous check of 12.5 and CBG was 124 in clinic.  -Congratulated her with better control of her diabetes. Patient has stopped using steroid which help for better control of her diabetes. -She will continue current management with Levemir and NovoLog and asked to bring her glucometer during next follow-up appointment.

## 2019-02-10 NOTE — Assessment & Plan Note (Signed)
BP Readings from Last 3 Encounters:  02/10/19 137/74  01/28/19 (!) 145/91  10/14/18 120/86   Her blood pressure was elevated today. Patient used to take lisinopril 20 mg daily previously which was stopped by her.  She remained normotensive without any antihypertensives for many months.  -Restart lisinopril 10 mg daily as patient is diabetic. -Reevaluate in 1 month she will need CMP at that time.

## 2019-02-10 NOTE — Progress Notes (Signed)
   CC: Bilateral knee, left hip and bilateral ankle pain.  HPI:  Hannah Young is a 53 y.o. with past medical history as listed below came to the clinic with complaint of multiple aches and pains. She was complaining of worsening bilateral knee and ankle pains and recently started left hip pain.  She was asking for a steroid injection in her knees as it was helpful previously.  Please see assessment and plan for her chronic conditions.  Past Medical History:  Diagnosis Date  . Anxiety   . Arthritis   . Asthma   . Bipolar 1 disorder ([place])   . Breast lump   . Cirrhosis ([place])   . Depression   . Diabetes mellitus   . FH: chemotherapy   . Gout   . Hepatitis B infection   . High cholesterol   . Hyperglycemia   . Hypertension   . Liver cirrhosis ([place])   . Lung nodules   . Neuropathy   . Panic disorder   . Vaginal discharge 01/25/2017  . Vertigo    Review of Systems: Negative except mentioned in HPI.  Physical Exam:  Vitals:   02/10/19 1343 02/10/19 1345  BP:  137/74  Pulse:  81  Temp:  98.8 F (37.1 C)  TempSrc:  Oral  SpO2:  100%  Weight: 213 lb 9.6 oz (96.9 kg)   Height: 5\' 1"  (1.549 m)     General: Vital signs reviewed.  Patient is well-developed and well-nourished, in no acute distress and cooperative with exam.  Head: Normocephalic and atraumatic. Eyes: EOMI, conjunctivae normal, no scleral icterus.  Cardiovascular: RRR, S1 normal, S2 normal, no murmurs, gallops, or rubs. Pulmonary/Chest: Clear to auscultation bilaterally, no wheezes, rales, or rhonchi. Abdominal: Soft, non-tender, non-distended, BS +, Musculoskeletal: No joint deformities, erythema, or stiffness, mildly restricted range of motion bilaterally at the knees and left hip with mild tenderness in left groin area. Extremities: No lower extremity edema bilaterally,  pulses symmetric and intact bilaterally. No cyanosis or clubbing. Skin: Warm, dry and intact. No rashes or erythema. Psychiatric:  Normal mood and affect. speech and behavior is normal. Cognition and memory are normal.  Assessment & Plan:   See Encounters Tab for problem based charting.  Patient seen with Dr. Angelia Mould.

## 2019-02-11 LAB — MICROALBUMIN / CREATININE URINE RATIO
Creatinine, Urine: 51.8 mg/dL
Microalb/Creat Ratio: 6 mg/g creat (ref 0–29)
Microalbumin, Urine: 3.1 ug/mL

## 2019-02-12 NOTE — Telephone Encounter (Signed)
Patient was on 15 units levemir according to last note. Please clarify with patient

## 2019-02-13 NOTE — Telephone Encounter (Signed)
Any updates on the insulin dose for this patient?

## 2019-02-13 NOTE — Telephone Encounter (Signed)
The doses on the request are correct, called pt, stated she told the pcp when she was in clinic 6/29

## 2019-02-18 ENCOUNTER — Ambulatory Visit: Payer: Medicare Other

## 2019-02-19 ENCOUNTER — Encounter: Payer: Self-pay | Admitting: Internal Medicine

## 2019-02-19 ENCOUNTER — Encounter: Payer: Self-pay | Admitting: *Deleted

## 2019-02-19 ENCOUNTER — Ambulatory Visit: Payer: Medicare Other

## 2019-02-25 ENCOUNTER — Encounter: Payer: Self-pay | Admitting: *Deleted

## 2019-02-25 ENCOUNTER — Ambulatory Visit: Payer: Medicare Other

## 2019-03-10 ENCOUNTER — Encounter: Payer: Self-pay | Admitting: Internal Medicine

## 2019-03-10 ENCOUNTER — Ambulatory Visit: Payer: Medicare HMO

## 2019-03-27 NOTE — Addendum Note (Signed)
Addended by: Hulan Fray on: 03/27/2019 05:55 PM   Modules accepted: Orders

## 2019-04-23 ENCOUNTER — Encounter: Payer: Self-pay | Admitting: Internal Medicine

## 2019-05-14 NOTE — Addendum Note (Signed)
Addended by: Yvonna Alanis E on: 05/14/2019 12:52 PM   Modules accepted: Orders

## 2019-06-12 ENCOUNTER — Telehealth: Payer: Self-pay | Admitting: Internal Medicine

## 2019-06-12 NOTE — Telephone Encounter (Signed)
Spoke to rep she states that pt had a telephone visit yesterday, pt states her cbg are elevated, she has an open area on her foot that is draining. States she does not want to come to clinic because of COVID but her son will be in town next week and she may come then. Called the # listed for her, person stated they are her daughter and that pt is out with the grandchildren at this time, states she will return at about 37, ask her to let pt know call would be returned at appr [phone]

## 2019-06-12 NOTE — Telephone Encounter (Signed)
Returned call to [phone]. No extension number provided. As patient does not have [employer] there was no way for operator to connect to [place]. Hubbard Hartshorn, BSN, RN-BC

## 2019-06-12 NOTE — Telephone Encounter (Signed)
[employer] wants a callback [phone]

## 2019-06-25 ENCOUNTER — Other Ambulatory Visit: Payer: Self-pay | Admitting: Student in an Organized Health Care Education/Training Program

## 2019-06-25 DIAGNOSIS — Z794 Long term (current) use of insulin: Secondary | ICD-10-CM

## 2019-06-25 DIAGNOSIS — E118 Type 2 diabetes mellitus with unspecified complications: Secondary | ICD-10-CM

## 2019-07-27 ENCOUNTER — Other Ambulatory Visit: Payer: Self-pay

## 2019-07-27 ENCOUNTER — Ambulatory Visit (HOSPITAL_COMMUNITY)
Admission: EM | Admit: 2019-07-27 | Discharge: 2019-07-27 | Disposition: A | Discharge status: Home / Self Care | Payer: Medicare HMO | Attending: Emergency Medicine | Admitting: Emergency Medicine

## 2019-07-27 ENCOUNTER — Encounter (HOSPITAL_COMMUNITY): Payer: Self-pay

## 2019-07-27 DIAGNOSIS — N898 Other specified noninflammatory disorders of vagina: Secondary | ICD-10-CM

## 2019-07-27 DIAGNOSIS — E119 Type 2 diabetes mellitus without complications: Secondary | ICD-10-CM

## 2019-07-27 DIAGNOSIS — Z113 Encounter for screening for infections with a predominantly sexual mode of transmission: Secondary | ICD-10-CM

## 2019-07-27 DIAGNOSIS — M25511 Pain in right shoulder: Secondary | ICD-10-CM

## 2019-07-27 DIAGNOSIS — K7460 Unspecified cirrhosis of liver: Secondary | ICD-10-CM | POA: Diagnosis not present

## 2019-07-27 DIAGNOSIS — R21 Rash and other nonspecific skin eruption: Secondary | ICD-10-CM

## 2019-07-27 MED ORDER — TRIAMCINOLONE ACETONIDE 0.1 % EX CREA: 1.0000 "application " | TOPICAL_CREAM | Freq: Two times a day (BID) | CUTANEOUS | 0 refills | Status: DC

## 2019-07-27 MED ORDER — METRONIDAZOLE 500 MG PO TABS: 500.0000 mg | ORAL_TABLET | Freq: Two times a day (BID) | ORAL | 0 refills | Status: AC

## 2019-07-27 MED ORDER — CETIRIZINE HCL 1 MG/ML PO SOLN: 5.0000 mg | Freq: Every day | ORAL | 0 refills | Status: DC

## 2019-07-27 MED ORDER — DICLOFENAC SODIUM 1 % EX GEL: 2.0000 g | Freq: Four times a day (QID) | CUTANEOUS | 0 refills | Status: DC

## 2019-07-27 NOTE — Discharge Instructions (Addendum)
Shoulder Pain Most likely inflammatory, may have some arthritis Please use voltaren/diclofenac cream every 6 hours to shoulder  Rash Cetrizine in the morning, may supplement with benadryl Triamcinolone cream twice daily in think amount to face  Discharge Begin metronidazole twice daily for 1 week to treat for Bacterial Vaginosis and Trich Swab and Syphillis test pending  Follow up if any symptoms are not improving or worsening
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
## 2019-07-27 NOTE — ED Triage Notes (Signed)
Pt presents with vaginal discharge, rash on face that comes & goes, and right shoulder pain X 2 weeks.  Pt states she did have a fall almost 3 weeks ago.

## 2019-07-28 LAB — RPR
RPR Ser Ql: REACTIVE — AB
RPR Titer: NONREACTIVE

## 2019-07-29 LAB — CERVICOVAGINAL ANCILLARY ONLY
Bacterial vaginitis: POSITIVE — AB
Candida vaginitis: NEGATIVE
Chlamydia: NEGATIVE
Neisseria Gonorrhea: NEGATIVE
Trichomonas: NEGATIVE

## 2019-07-29 LAB — T.PALLIDUM AB, TOTAL: T Pallidum Abs: REACTIVE — AB

## 2019-07-31 ENCOUNTER — Telehealth: Payer: Self-pay | Admitting: Emergency Medicine

## 2019-07-31 ENCOUNTER — Telehealth: Payer: Self-pay | Admitting: Internal Medicine

## 2019-07-31 NOTE — Telephone Encounter (Signed)
Dr Sheppard Coil, will you please call pt and discuss this with her

## 2019-07-31 NOTE — Telephone Encounter (Signed)
Pt requesting a call back about her results given at the Urgent Care and has questions and would like a nurse to call her back.

## 2019-07-31 NOTE — Telephone Encounter (Signed)
Reviewed labs with [facility] de[facility]. Pt had large titer in 1987 of 1:200+ with treatment, had a rash on hands during active infection.  pt states she had a rash on her face now which is healing with the medicine. Per [facility], this is most likely not a syphilis infection however If pt is concerned about syphilis, to return in a few weeks for repeat testing. Pt verbalized understanding.

## 2019-08-01 ENCOUNTER — Telehealth (HOSPITAL_COMMUNITY): Payer: Self-pay | Admitting: Emergency Medicine

## 2019-08-01 NOTE — Telephone Encounter (Signed)
Pt calling back; pls contact [phone]

## 2019-08-01 NOTE — Telephone Encounter (Signed)
Patient has spoken to Urgent Care RN twice about results and no need for treatment at this time. Hubbard Hartshorn, BSN, RN-BC

## 2019-08-01 NOTE — Telephone Encounter (Signed)
Pt called back requesting treatment for "syphilis". Pt informed again that she has multiple RPR titers of 1:1 after her initial treatment in 1987, that this is her baseline. [facility] does not believe this is an active infection. An active infection is a titer greater than 1:8. Pt insistent that she wants treatment, per [facility], we will not treat a titer of 1:1, if patient is concerned about exposure, she will need to return in a few weeks and have repeat lab work done. If repeat lab work shows an increase in the titer, then we will treat. However, no treatment is indicated at this time. Pt verbalized understanding.

## 2019-08-11 ENCOUNTER — Ambulatory Visit (HOSPITAL_COMMUNITY)
Admission: EM | Admit: 2019-08-11 | Discharge: 2019-08-11 | Disposition: A | Discharge status: Home / Self Care | Payer: Medicare HMO | Attending: Family Medicine | Admitting: Family Medicine

## 2019-08-11 ENCOUNTER — Encounter (HOSPITAL_COMMUNITY): Payer: Self-pay

## 2019-08-11 ENCOUNTER — Other Ambulatory Visit: Payer: Self-pay

## 2019-08-11 DIAGNOSIS — R21 Rash and other nonspecific skin eruption: Secondary | ICD-10-CM | POA: Diagnosis not present

## 2019-08-11 DIAGNOSIS — J3489 Other specified disorders of nose and nasal sinuses: Secondary | ICD-10-CM | POA: Insufficient documentation

## 2019-08-11 DIAGNOSIS — N898 Other specified noninflammatory disorders of vagina: Secondary | ICD-10-CM | POA: Diagnosis not present

## 2019-08-11 DIAGNOSIS — Z0189 Encounter for other specified special examinations: Secondary | ICD-10-CM | POA: Diagnosis not present

## 2019-08-11 MED ORDER — MUPIROCIN CALCIUM 2 % NA OINT: TOPICAL_OINTMENT | NASAL | 0 refills | Status: DC

## 2019-08-11 MED ORDER — PERMETHRIN 5 % EX CREA: TOPICAL_CREAM | CUTANEOUS | 1 refills | Status: DC

## 2019-08-11 MED ORDER — FLUCONAZOLE 150 MG PO TABS: 150.0000 mg | ORAL_TABLET | Freq: Every day | ORAL | 0 refills | Status: DC

## 2019-08-11 NOTE — Telephone Encounter (Signed)
Pt is requesting a nurse to callback regarding results, pls contact [phone]

## 2019-08-11 NOTE — Discharge Instructions (Addendum)
Treating you for a yeast infection with Diflucan.  Take medication as prescribed You can apply the Bactroban nasal ointment to the sore in your nose twice a day The permethrin cream is for scabies.  You want to apply this head to toe and leave on for 8 to 12 hours You can then wash off Make sure you wash all your sheets in hot water. If symptoms continue you can repeat this treatment in 2 weeks. You can take Benadryl or Zyrtec for itching Follow up as needed for continued or worsening symptoms

## 2019-08-11 NOTE — Telephone Encounter (Signed)
RTC, pt states she was informed by urgent care she could have her RPR repeated in 2 weeks.  Pt states she has an appt at [facility] on 08/18/2019, but is asking if she comes in tomorrow (Tuesday) for RPR and STD testing, will her results come back the same day?  This nurse informed pt it would more than likely be more than a one day turn around for results and with it being a holiday week, our office closes at noon on Wednesday. RN offered pt appt in [facility], pt denied.  Pt states she will leave her appt as is on 08/18/19, but may call urgent care for repeat testing this week.   SChaplin, RN,BSN

## 2019-08-11 NOTE — ED Triage Notes (Signed)
Pt. Is here with a rash that is on her hand, upper back, she wants STD testing again like she had on her visit for 07/27/2019.

## 2019-08-12 LAB — RPR: RPR Ser Ql: NONREACTIVE

## 2019-08-12 NOTE — Telephone Encounter (Signed)
Pt is calling regarding results [phone]

## 2019-08-12 NOTE — Telephone Encounter (Signed)
I spoke on the phone for about 15 minutes with Hannah Young in regards to her positive RPR test 2 weeks ago in the setting of a new rash and joint pain. Patient states that she went to the urgent care out of concern for an STD, noting that she had a new sexual partner was experiencing vaginal discharge, a pruritic rash and joint pain.  She was prescribed Voltaren gel and triamcinolone ointment for the joint pain and rash, although this did not improve her symptoms.  RPR came back positive, however she has had syphilis in the 1980s. This test was repeated yesterday, and was negative.    As far as the rash is concerned, the patient describes as a pruritic rash mostly in the folds of her fingers and toes.  She states that one of her grandchildren has scabies currently and she thinks she got it from them.  At the urgent care yesterday, she states that she was prescribed permethrin cream but has not taken any yet because she still needs to pick up the medication.  She states that this rash is distinctly different than this rash she experienced when she had syphilis previously.  The patient currently has a appointment with me on 1/4.  I told the patient to keep this appointment so I can evaluate her in office in follow-up to make sure she is progressing in the correct direction.  Patient is in agreement and will keep her appointment.  All questions and concerns were addressed.  Earlene Plater, MD Internal Medicine, PGY1 Pager: [phone]  08/12/2019,1:18 PM

## 2019-08-12 NOTE — Telephone Encounter (Signed)
Called pt - stated she went to urgent care yesterday to repeat tests; requesting test results. Informed her doctor will give her a call when tests are complete.
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
## 2019-08-13 ENCOUNTER — Telehealth: Payer: Self-pay | Admitting: Emergency Medicine

## 2019-08-13 LAB — CERVICOVAGINAL ANCILLARY ONLY
Bacterial vaginitis: POSITIVE — AB
Candida vaginitis: NEGATIVE
Chlamydia: NEGATIVE
Neisseria Gonorrhea: NEGATIVE
Trichomonas: NEGATIVE

## 2019-08-13 MED ORDER — METRONIDAZOLE 500 MG PO TABS: 500.0000 mg | ORAL_TABLET | Freq: Two times a day (BID) | ORAL | 0 refills | Status: AC

## 2019-08-13 NOTE — Telephone Encounter (Signed)
Bacterial vaginosis is positive. This was not treated at the urgent care visit.  Flagyl 500 mg BID x 7 days #14 no refills sent to patients pharmacy of choice.    Patient contacted by phone and made aware of    results. Pt verbalized understanding and had all questions answered.    

## 2019-08-18 ENCOUNTER — Encounter: Payer: Medicare HMO | Admitting: Internal Medicine

## 2019-08-22 ENCOUNTER — Emergency Department (HOSPITAL_COMMUNITY)
Admission: EM | Admit: 2019-08-22 | Discharge: 2019-08-22 | Discharge status: Against Medical Advice | Payer: Medicare HMO | Attending: Emergency Medicine | Admitting: Emergency Medicine

## 2019-08-22 ENCOUNTER — Other Ambulatory Visit: Payer: Self-pay

## 2019-08-22 ENCOUNTER — Encounter (HOSPITAL_COMMUNITY): Payer: Self-pay

## 2019-08-22 ENCOUNTER — Emergency Department (HOSPITAL_COMMUNITY): Payer: Medicare HMO

## 2019-08-22 DIAGNOSIS — S2231XD Fracture of one rib, right side, subsequent encounter for fracture with routine healing: Secondary | ICD-10-CM | POA: Insufficient documentation

## 2019-08-22 DIAGNOSIS — I10 Essential (primary) hypertension: Secondary | ICD-10-CM | POA: Insufficient documentation

## 2019-08-22 DIAGNOSIS — R7989 Other specified abnormal findings of blood chemistry: Secondary | ICD-10-CM | POA: Diagnosis not present

## 2019-08-22 DIAGNOSIS — F419 Anxiety disorder, unspecified: Secondary | ICD-10-CM | POA: Insufficient documentation

## 2019-08-22 DIAGNOSIS — J45909 Unspecified asthma, uncomplicated: Secondary | ICD-10-CM | POA: Diagnosis not present

## 2019-08-22 DIAGNOSIS — Z91040 Latex allergy status: Secondary | ICD-10-CM | POA: Diagnosis not present

## 2019-08-22 DIAGNOSIS — Z794 Long term (current) use of insulin: Secondary | ICD-10-CM | POA: Insufficient documentation

## 2019-08-22 DIAGNOSIS — R531 Weakness: Secondary | ICD-10-CM | POA: Diagnosis not present

## 2019-08-22 DIAGNOSIS — Z79899 Other long term (current) drug therapy: Secondary | ICD-10-CM | POA: Insufficient documentation

## 2019-08-22 DIAGNOSIS — S2231XA Fracture of one rib, right side, initial encounter for closed fracture: Secondary | ICD-10-CM | POA: Diagnosis not present

## 2019-08-22 DIAGNOSIS — Z87891 Personal history of nicotine dependence: Secondary | ICD-10-CM | POA: Insufficient documentation

## 2019-08-22 DIAGNOSIS — R21 Rash and other nonspecific skin eruption: Secondary | ICD-10-CM | POA: Diagnosis not present

## 2019-08-22 DIAGNOSIS — R519 Headache, unspecified: Secondary | ICD-10-CM | POA: Diagnosis not present

## 2019-08-22 DIAGNOSIS — E119 Type 2 diabetes mellitus without complications: Secondary | ICD-10-CM | POA: Insufficient documentation

## 2019-08-22 DIAGNOSIS — S4991XA Unspecified injury of right shoulder and upper arm, initial encounter: Secondary | ICD-10-CM | POA: Diagnosis not present

## 2019-08-22 DIAGNOSIS — Z5320 Procedure and treatment not carried out because of patient's decision for unspecified reasons: Secondary | ICD-10-CM | POA: Diagnosis not present

## 2019-08-22 DIAGNOSIS — M25511 Pain in right shoulder: Secondary | ICD-10-CM | POA: Diagnosis not present

## 2019-08-22 DIAGNOSIS — W19XXXD Unspecified fall, subsequent encounter: Secondary | ICD-10-CM | POA: Insufficient documentation

## 2019-08-22 DIAGNOSIS — M791 Myalgia, unspecified site: Secondary | ICD-10-CM | POA: Diagnosis not present

## 2019-08-22 LAB — COMPREHENSIVE METABOLIC PANEL
ALT: 302 U/L — ABNORMAL HIGH (ref 0–44)
AST: 298 U/L — ABNORMAL HIGH (ref 15–41)
Albumin: 3.7 g/dL (ref 3.5–5.0)
Alkaline Phosphatase: 127 U/L — ABNORMAL HIGH (ref 38–126)
Anion gap: 11 (ref 5–15)
BUN: 12 mg/dL (ref 6–20)
CO2: 22 mmol/L (ref 22–32)
Calcium: 9.8 mg/dL (ref 8.9–10.3)
Chloride: 106 mmol/L (ref 98–111)
Creatinine, Ser: 0.53 mg/dL (ref 0.44–1.00)
GFR calc Af Amer: >60 mL/min (ref >60)
GFR calc non Af Amer: >60 mL/min (ref >60)
Glucose, Bld: 91 mg/dL (ref 70–99)
Potassium: 3.8 mmol/L (ref 3.5–5.1)
Sodium: 139 mmol/L (ref 135–145)
Total Bilirubin: 0.7 mg/dL (ref 0.3–1.2)
Total Protein: 8.2 g/dL — ABNORMAL HIGH (ref 6.5–8.1)

## 2019-08-22 LAB — URINALYSIS, ROUTINE W REFLEX MICROSCOPIC
Bacteria, UA: NONE SEEN
Bilirubin Urine: NEGATIVE
Glucose, UA: NEGATIVE mg/dL
Hgb urine dipstick: NEGATIVE
Ketones, ur: NEGATIVE mg/dL
Nitrite: NEGATIVE
Protein, ur: NEGATIVE mg/dL
Specific Gravity, Urine: 1.021 (ref 1.005–1.030)
pH: 5 (ref 5.0–8.0)

## 2019-08-22 LAB — CBC
HCT: 35.3 % — ABNORMAL LOW (ref 36.0–46.0)
Hemoglobin: 11.4 g/dL — ABNORMAL LOW (ref 12.0–15.0)
MCH: 30.5 pg (ref 26.0–34.0)
MCHC: 32.3 g/dL (ref 30.0–36.0)
MCV: 94.4 fL (ref 80.0–100.0)
Platelets: 493 10*3/uL — ABNORMAL HIGH (ref 150–400)
RBC: 3.74 MIL/uL — ABNORMAL LOW (ref 3.87–5.11)
RDW: 19.9 % — ABNORMAL HIGH (ref 11.5–15.5)
WBC: 9.4 10*3/uL (ref 4.0–10.5)
nRBC: 0 % (ref 0.0–0.2)

## 2019-08-22 LAB — LIPASE, BLOOD: Lipase: 23 U/L (ref 11–51)

## 2019-08-22 LAB — CBG MONITORING, ED: Glucose-Capillary: 83 mg/dL (ref 70–99)

## 2019-08-22 LAB — I-STAT BETA HCG BLOOD, ED (MC, WL, AP ONLY): I-stat hCG, quantitative: <5 m[IU]/mL (ref <5)
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
## 2019-08-22 NOTE — ED Notes (Signed)
Pt loud and disruptive on the phone in her room; pt wishes to [facility]. Discharge instructions provided to pt by Georgiann Hahn RN. Pt ambulated out of ER with a steady gait. No signs of acute distress. Pt alert, oriented, and well appearing. Pt ambulated to the exit of the ED with Bennetta La[place].

## 2019-08-22 NOTE — ED Triage Notes (Signed)
Pt has multiple complaints: fall with LOC 3 weeks ago, pt hit the back of her head on the sidewalk but was not seen by a medical provider. Pt a.o right shoulder and tailbone pain. Pt also reports a rash on her chest, back and arms for the past 3 weeks. Pt also c.o generalized fatigue. Pt has hx of liver cirrhosis and believes her symptoms are related to that. Pt a.o at this time

## 2019-08-25 ENCOUNTER — Encounter: Payer: Self-pay | Admitting: Internal Medicine

## 2019-08-25 ENCOUNTER — Encounter: Payer: Medicare HMO | Admitting: Internal Medicine

## 2019-08-25 NOTE — Assessment & Plan Note (Deleted)
Pt was prescribed Flagyl 500 mg BID on 12/28 after testing positive for BV at an urgent care.

## 2019-08-28 ENCOUNTER — Ambulatory Visit: Payer: Medicare HMO

## 2019-08-28 ENCOUNTER — Encounter: Payer: Self-pay | Admitting: Internal Medicine

## 2019-08-29 ENCOUNTER — Encounter: Payer: Self-pay | Admitting: Internal Medicine

## 2019-09-02 ENCOUNTER — Encounter: Payer: Self-pay | Admitting: Internal Medicine

## 2019-09-02 ENCOUNTER — Other Ambulatory Visit: Payer: Self-pay

## 2019-09-02 ENCOUNTER — Ambulatory Visit (INDEPENDENT_AMBULATORY_CARE_PROVIDER_SITE_OTHER): Payer: Medicare HMO | Admitting: Internal Medicine

## 2019-09-02 VITALS — BP 134/95 | HR 97 | Temp 97.8°F | Ht 61.0 in | Wt 195.2 lb

## 2019-09-02 DIAGNOSIS — R7989 Other specified abnormal findings of blood chemistry: Secondary | ICD-10-CM | POA: Diagnosis not present

## 2019-09-02 DIAGNOSIS — Z9181 History of falling: Secondary | ICD-10-CM | POA: Diagnosis not present

## 2019-09-02 DIAGNOSIS — W19XXXA Unspecified fall, initial encounter: Secondary | ICD-10-CM

## 2019-09-02 DIAGNOSIS — F0781 Postconcussional syndrome: Secondary | ICD-10-CM | POA: Diagnosis not present

## 2019-09-02 DIAGNOSIS — K754 Autoimmune hepatitis: Secondary | ICD-10-CM

## 2019-09-02 LAB — PROTIME-INR
INR: 1 (ref 0.8–1.2)
Prothrombin Time: 12.7 seconds (ref 11.4–15.2)

## 2019-09-02 MED ORDER — CYCLOBENZAPRINE HCL 5 MG PO TABS: 5.0000 mg | ORAL_TABLET | Freq: Every evening | ORAL | 0 refills | Status: DC | PRN

## 2019-09-02 NOTE — Progress Notes (Signed)
   CC: Elevated LFTs, Fall  HPI:  [name] is a 54 y.o. female with PMHx listed below presenting for Elevated LFTs, Fall. Please see the A&P for the status of the patient's chronic medical problems.  Past Medical History:  Diagnosis Date  . Anxiety   . Arthritis   . Asthma   . Bipolar 1 disorder ([facility])   . Breast lump   . Cirrhosis ([facility])   . Depression   . Diabetes mellitus   . FH: chemotherapy   . Gout   . Hepatitis B infection   . High cholesterol   . Hyperglycemia   . Hypertension   . Liver cirrhosis ([facility])   . Lung nodules   . Neuropathy   . Panic disorder   . Vaginal discharge 01/25/2017  . Vertigo    Review of Systems:  Performed and all others negative.  Physical Exam: Vitals:   09/02/19 0852  BP: (!) 134/95  Pulse: 97  Temp: 97.8 F (36.6 C)  TempSrc: Oral  SpO2: 100%  Weight: 195 lb 3.2 oz (88.5 kg)  Height: 5\' 1"  (1.549 m)   General: Well nourished female in no acute distress Pulm: Good air movement with no wheezing or crackles  CV: RRR, no murmurs, no rubs   Assessment & Plan:   See Encounters Tab for problem based charting.  Patient discussed with Dr. 

## 2019-09-02 NOTE — Assessment & Plan Note (Addendum)
Patient sustained mechanical fall in November. Since that time she has had right shoulder, right rib, and left hip pain. She did lose consciousness with this fall. She did strike her head. She has had daily headaches, difficulty concentrating, and difficulties with memory since that time. She has had no photosensitivity. On physical exam she does have pain with internal and external rotation of the femoral head. We will obtain x-rays today for further evaluation. We will also try cyclobenzaprine 5 mg QHS.

## 2019-09-02 NOTE — Patient Instructions (Addendum)
Thank you for allowing [place] to provide your care. Today we are gonna check a lot of labs and get a picture of your hip. I will call you once I have these results.  I am also referring you back to gastroenterology and [facility] who may help with your patient care. Please come back to see [place] in three months or sooner if any issues arise.

## 2019-09-02 NOTE — Assessment & Plan Note (Signed)
Patient with a questionable history of autoimmune hepatitis. Initially diagnosed back in 2011. Biopsy at the time illustrated chronic active hepatitis which could be related to autoimmune hepatitis. She has had a negative ANA test. I was unable to locate testing for anti-smooth muscle antibodies. She is not followed up with G.I. on several occasions. She was previously on chronic steroids however these were discontinued.  After her fall in November she is started drinking. She drinks approximately 2, 12 ounce beers per day and a pint of liquor per week. She is started to notice generalized pruritus and easy bruising. She is not had any changes in her ability to sleep. She has had difficulties and concentration. She has had no changes in bowel movements.  A/P: - Needs to follow-up with GI  - Check CMP, INR, Hep B/C, anti-smooth muscle, and HIV  - May need to restart Prednisone  - Referral to [facility]

## 2019-09-02 NOTE — Assessment & Plan Note (Signed)
Patient presented to the emergency department on 1/8. Workup at the time illustrated elevated LFTs. Please see "autoimmune hepatitis" for further evaluation and management.

## 2019-09-02 NOTE — Patient Outreach (Signed)
[facility]) Care Management  09/02/2019  Hannah Young July 12, 1966 [id number]   Referral received on patient from PCP.  Patient is part of the [facility] SNP program at this time.  MD has been notified that [facility] does not follow [employer] SNP at this time.  Plan: RN CM will close case.   Bary Leriche, RN, MSN [facility] Care Management Care Management Coordinator Direct Line [phone] Cell [phone] Toll Free: [phone]  Fax: [phone]

## 2019-09-02 NOTE — Assessment & Plan Note (Addendum)
Traumatic fall back in November. At that time she did strike her head and lose consciousness. Since that time she has been dealing with daily headaches, memory issues, and difficulty concentrating. Neuro exam is nonfocal. Seems consistent with postconcussive syndrome.

## 2019-09-03 ENCOUNTER — Ambulatory Visit (HOSPITAL_COMMUNITY)
Admission: RE | Admit: 2019-09-03 | Discharge: 2019-09-03 | Disposition: A | Discharge status: Home / Self Care | Payer: Medicare HMO | Source: Ambulatory Visit | Attending: Internal Medicine | Admitting: Internal Medicine

## 2019-09-03 ENCOUNTER — Telehealth: Payer: Self-pay | Admitting: Internal Medicine

## 2019-09-03 DIAGNOSIS — W19XXXA Unspecified fall, initial encounter: Secondary | ICD-10-CM | POA: Diagnosis not present

## 2019-09-03 DIAGNOSIS — M25552 Pain in left hip: Secondary | ICD-10-CM | POA: Diagnosis not present

## 2019-09-03 DIAGNOSIS — S79912A Unspecified injury of left hip, initial encounter: Secondary | ICD-10-CM | POA: Diagnosis not present

## 2019-09-03 LAB — HEPATITIS B SURFACE ANTIBODY,QUALITATIVE: Hep B Surface Ab, Qual: REACTIVE

## 2019-09-03 LAB — CMP14 + ANION GAP
ALT: 270 IU/L — ABNORMAL HIGH (ref 0–32)
AST: 296 IU/L — ABNORMAL HIGH (ref 0–40)
Albumin/Globulin Ratio: 1.1 — ABNORMAL LOW (ref 1.2–2.2)
Albumin: 4.1 g/dL (ref 3.8–4.9)
Alkaline Phosphatase: 141 IU/L — ABNORMAL HIGH (ref 39–117)
Anion Gap: 14 mmol/L (ref 10.0–18.0)
BUN/Creatinine Ratio: 27 — ABNORMAL HIGH (ref 9–23)
BUN: 14 mg/dL (ref 6–24)
Bilirubin Total: 0.5 mg/dL (ref 0.0–1.2)
CO2: 22 mmol/L (ref 20–29)
Calcium: 9.5 mg/dL (ref 8.7–10.2)
Chloride: 104 mmol/L (ref 96–106)
Creatinine, Ser: 0.51 mg/dL — ABNORMAL LOW (ref 0.57–1.00)
GFR calc Af Amer: 129 mL/min/{1.73_m2} (ref >59)
GFR calc non Af Amer: 112 mL/min/{1.73_m2} (ref >59)
Globulin, Total: 3.8 g/dL (ref 1.5–4.5)
Glucose: 112 mg/dL — ABNORMAL HIGH (ref 65–99)
Potassium: 4.4 mmol/L (ref 3.5–5.2)
Sodium: 140 mmol/L (ref 134–144)
Total Protein: 7.9 g/dL (ref 6.0–8.5)

## 2019-09-03 LAB — HEPATITIS B CORE ANTIBODY, TOTAL: Hep B Core Total Ab: NEGATIVE

## 2019-09-03 LAB — ANTI-SMOOTH MUSCLE ANTIBODY, IGG: Smooth Muscle Ab: 17 Units (ref 0–19)

## 2019-09-03 LAB — HEPATITIS C ANTIBODY: Hep C Virus Ab: <0.1 s/co ratio (ref 0.0–0.9)

## 2019-09-03 LAB — HEPATITIS B SURFACE ANTIGEN: Hepatitis B Surface Ag: NEGATIVE

## 2019-09-03 LAB — HIV ANTIBODY (ROUTINE TESTING W REFLEX): HIV Screen 4th Generation wRfx: NONREACTIVE

## 2019-09-03 NOTE — Telephone Encounter (Signed)
Pls said that [facility] will not take her back as a patient, so she is wanting to go to [facility]; pt contact [phone]

## 2019-09-03 NOTE — Progress Notes (Signed)
Internal Medicine Clinic Attending  Case discussed with Dr. Helberg at the time of the visit.  We reviewed the resident's history and exam and pertinent patient test results.  I agree with the assessment, diagnosis, and plan of care documented in the resident's note.    

## 2019-09-05 ENCOUNTER — Telehealth: Payer: Self-pay | Admitting: Internal Medicine

## 2019-09-05 DIAGNOSIS — K754 Autoimmune hepatitis: Secondary | ICD-10-CM

## 2019-09-05 NOTE — Telephone Encounter (Signed)
Requested new Referral to be placed as GI Referral was already closed. Spoke with the patient this morning and a new Referral has been placed by  Dr. Caron Presume.

## 2019-09-05 NOTE — Telephone Encounter (Signed)
Needs referral to GI for possible autoimmune hepatitis.

## 2019-09-05 NOTE — Telephone Encounter (Signed)
Pt missed a call yesterday, pls return call [phone]

## 2019-09-08 ENCOUNTER — Telehealth: Payer: Self-pay | Admitting: Internal Medicine

## 2019-09-08 NOTE — Telephone Encounter (Signed)
I spoke on the phone with Ms. Kantner about her request of her prednisone refill.  After chart review, it appears that Ms. Dehnert had been on prednisone in the past for her chronic autoimmune hepatitis through GI.  However, this medication was discontinued and she has not been prescribed this in quite some time. I told her that we will wait on the current referral to take place first, and let GI determine if this is a medication she should start taking again.  Patient voiced understanding is in agreement.  She states that she is still waiting on getting a phone call to set up an appointment.  Of note, the patient states that all of her joints hurt and that she is in pain and would like some medication for it.  I suggested the patient tried ibuprofen, however she says she cannot take it because she has stomach problems.  Upon further chart review, I do not see any documentation of stomach ulcers, bleeding, kidney disease that would make it contraindicated.  I believe it is safe for her to take ibuprofen as needed for now.  Kirt Boys, MD Internal Medicine, PGY1 Pager: [phone]  09/08/2019,2:59 PM

## 2019-09-08 NOTE — Telephone Encounter (Signed)
Not on med list, please advise 

## 2019-09-08 NOTE — Telephone Encounter (Signed)
Refill Request   Predisone  [facility] - [place], [place] - [address]

## 2019-09-22 ENCOUNTER — Telehealth: Payer: Self-pay | Admitting: Internal Medicine

## 2019-09-22 NOTE — Telephone Encounter (Signed)
rtc to pt, rang and rang, will try again later

## 2019-09-22 NOTE — Telephone Encounter (Signed)
Pt rtn phone call about being in pain.

## 2019-09-22 NOTE — Telephone Encounter (Signed)
Pt requesting a nurse to call back; pt is in a lot of pain; [phone]

## 2019-10-29 ENCOUNTER — Ambulatory Visit: Payer: Medicare HMO

## 2019-11-16 ENCOUNTER — Emergency Department (HOSPITAL_COMMUNITY)
Admission: EM | Admit: 2019-11-16 | Discharge: 2019-11-16 | Disposition: A | Discharge status: Home / Self Care | Payer: Medicare HMO | Attending: Emergency Medicine | Admitting: Emergency Medicine

## 2019-11-16 ENCOUNTER — Encounter (HOSPITAL_COMMUNITY): Payer: Self-pay

## 2019-11-16 ENCOUNTER — Emergency Department (HOSPITAL_COMMUNITY): Payer: Medicare HMO

## 2019-11-16 DIAGNOSIS — Z87891 Personal history of nicotine dependence: Secondary | ICD-10-CM | POA: Insufficient documentation

## 2019-11-16 DIAGNOSIS — I10 Essential (primary) hypertension: Secondary | ICD-10-CM | POA: Diagnosis not present

## 2019-11-16 DIAGNOSIS — Z79899 Other long term (current) drug therapy: Secondary | ICD-10-CM | POA: Diagnosis not present

## 2019-11-16 DIAGNOSIS — E119 Type 2 diabetes mellitus without complications: Secondary | ICD-10-CM | POA: Diagnosis not present

## 2019-11-16 DIAGNOSIS — G8929 Other chronic pain: Secondary | ICD-10-CM

## 2019-11-16 DIAGNOSIS — Z794 Long term (current) use of insulin: Secondary | ICD-10-CM | POA: Insufficient documentation

## 2019-11-16 DIAGNOSIS — R609 Edema, unspecified: Secondary | ICD-10-CM | POA: Diagnosis not present

## 2019-11-16 DIAGNOSIS — M25561 Pain in right knee: Secondary | ICD-10-CM | POA: Diagnosis not present

## 2019-11-16 DIAGNOSIS — J45909 Unspecified asthma, uncomplicated: Secondary | ICD-10-CM | POA: Diagnosis not present

## 2019-11-16 MED ORDER — DICLOFENAC SODIUM 1 % EX GEL: 2.0000 g | Freq: Four times a day (QID) | CUTANEOUS | 0 refills | Status: DC

## 2019-11-16 MED ORDER — IBUPROFEN 600 MG PO TABS: 600.0000 mg | ORAL_TABLET | Freq: Three times a day (TID) | ORAL | 0 refills | Status: DC | PRN
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
## 2019-11-16 NOTE — ED Notes (Addendum)
[facility] going [facility] since [place] can't help her.    Explained discharge instructions with patient and to follow up with orthopedist.   Prescriptions reviewed with patient.  Patient states she can not take ibuprofen because it makes her bleed.   Patient instructed to use topical prescription on right knee.   Knee sleeve applied to right knee. Size medium.

## 2019-11-16 NOTE — ED Notes (Signed)
Patient changed into gown.   Patient refusing to take pants off at this time.  Patient states its hard to get pants back on.  Patient follows all other commands.

## 2019-11-16 NOTE — Discharge Instructions (Signed)
Please follow up with Dr. Roda Shutters your orthopedist for further evaluation of your chronic knee pain Discuss taking Ibuprofen with your PCP however it does appear there is no contraindication for you to take this. I have also prescribed a cream to apply on your knee itself Return to the ED for any worsening symptoms including worsening pain, redness/increased warmth to the joint, fevers > 100.4, or any other concerning symptoms.

## 2019-11-16 NOTE — ED Triage Notes (Signed)
Patient arrived via [facility] from home.   C/O chronic right knee pain   Patient was scheduled to have surgery but patient is diabetic and unable to complete surgery at this time.   Patient reports walking 2 days and felt her knee pop.    No deformities or dislocation noted by ems Swelling noted above right knee cap.    Patient reports her pcp told her not to take tylenol or ibuprofen. Patient reports she has not taken any pain medication  Patient ambulatory with assistance with ems   A/ox4   BP-110/78 CBG-158 P-79 RR-18 97% RA

## 2019-11-16 NOTE — ED Notes (Signed)
Per stating she does not want the PA to come back in her room and she does not want to see the PA again.

## 2019-11-24 ENCOUNTER — Encounter: Payer: Self-pay | Admitting: Dietician

## 2019-11-24 ENCOUNTER — Ambulatory Visit: Payer: Medicare HMO

## 2019-11-24 ENCOUNTER — Ambulatory Visit (INDEPENDENT_AMBULATORY_CARE_PROVIDER_SITE_OTHER): Payer: Medicare HMO | Admitting: Dietician

## 2019-11-24 ENCOUNTER — Encounter: Payer: Self-pay | Admitting: Internal Medicine

## 2019-11-24 ENCOUNTER — Ambulatory Visit (INDEPENDENT_AMBULATORY_CARE_PROVIDER_SITE_OTHER): Payer: Medicare HMO | Admitting: Internal Medicine

## 2019-11-24 VITALS — BP 140/95 | HR 90 | Temp 98.0°F | Ht 61.0 in | Wt 191.4 lb

## 2019-11-24 DIAGNOSIS — R7989 Other specified abnormal findings of blood chemistry: Secondary | ICD-10-CM | POA: Diagnosis not present

## 2019-11-24 DIAGNOSIS — E119 Type 2 diabetes mellitus without complications: Secondary | ICD-10-CM

## 2019-11-24 DIAGNOSIS — Z794 Long term (current) use of insulin: Secondary | ICD-10-CM

## 2019-11-24 DIAGNOSIS — M1711 Unilateral primary osteoarthritis, right knee: Secondary | ICD-10-CM | POA: Diagnosis not present

## 2019-11-24 DIAGNOSIS — R300 Dysuria: Secondary | ICD-10-CM | POA: Diagnosis not present

## 2019-11-24 DIAGNOSIS — A64 Unspecified sexually transmitted disease: Secondary | ICD-10-CM | POA: Diagnosis not present

## 2019-11-24 LAB — POCT GLYCOSYLATED HEMOGLOBIN (HGB A1C): Hemoglobin A1C: 6.8 % — AB (ref 4.0–5.6)

## 2019-11-24 LAB — GLUCOSE, CAPILLARY: Glucose-Capillary: 222 mg/dL — ABNORMAL HIGH (ref 70–99)

## 2019-11-24 LAB — HM DIABETES EYE EXAM

## 2019-11-24 MED ORDER — ACCU-CHEK SOFTCLIX LANCETS MISC: 12 refills | Status: DC

## 2019-11-24 MED ORDER — MELOXICAM 7.5 MG PO TABS: 7.5000 mg | ORAL_TABLET | Freq: Every day | ORAL | 0 refills | Status: DC

## 2019-11-24 NOTE — Patient Instructions (Signed)
  You had photos taken of your retinas (part of your eyes).    These photos were sent to eye doctors in [place] to see if diabetes is affecting the back of your eye.    If the doctor in [place] tells us that you should see an eye doctor, we will call you.   If the doctor in [place] says to return in 1 year. You will get a copy of the report in the mail.    Thank you for allowing us to help with your diabetes and vision care today!   Sincerely,  Christan Ciccarelli [phone]  

## 2019-11-24 NOTE — Patient Instructions (Signed)
Thank you for visiting [place] in clinic today. Below is a summary of what we discussed.  1. Diabetes -Your blood sugars are under good control. Continue taking your insulin as prescribed. If you need more, let us know so we can refill the medication.  2. Knee arthritis - We have ordered a referral to Physical Therapy to teach you exercises for your knee. If you don't hear from them within 1-2 weeks, let us know so we can help set it up for you. - Try taking Mobic 7.5 mg once a day as needed for pain. Take this medication with food. - We have also referred you to a nutritionist to help with choosing good foods to eat with your diabetes and also to help you lose weight  3. Follow up  - Please schedule an appointment to see me again in 1 month  If you have any questions or concerns, please feel free to reach out to the clinic.

## 2019-11-24 NOTE — Assessment & Plan Note (Signed)
Pt had an ED visit on 4/4 for R knee pain. Xrays demonstrate joint space narrowing of the R medial knee, consitent with osteoarthritis. Today, she has joint line tenderness in the R medial knee. We discussed how weight loss and physical therapy will help the pain.  -PT referral placed -Nutritionist consult placed -Mobic 7.5 mg daily ordered, instructed to take w/ food -F/u in one month

## 2019-11-24 NOTE — Assessment & Plan Note (Addendum)
Pt requesting syphilis test. She states that she thinks she is getting it from her husband. The patient had a test 2 months ago that showed positive antibodies. Upon chart review, she frequently requests STD testing and goes to urgent cares to get them done and are frequently negative. I offered to examine the patient's genitourinary areas to access the need for testing, but she declined. -Consider testing at next clinic visit

## 2019-11-24 NOTE — Assessment & Plan Note (Signed)
Pt has chronic LFT elevations from autoimmune hepatitis. She has been followed by GI in the outpatient setting. She says that she was supposed to schedule a f/u appointment with them, but hasn't done it yet. She states by our next visit next month, she will follow up with GI.  -CMP today

## 2019-11-24 NOTE — Assessment & Plan Note (Addendum)
Pt states that she takes her insulin as prescribed. She uses Levimir 15 U nightly and Novolog 5U TID with meals. A1c 6.8 today. 9 months ago, it was 6.9. Pt states that she is amendable to talking with the nutritionist about food choices. She says she needs more accu check lancets.  -Accu check lancets refilled -Referal to nutritionist   -Pt needs to talk with Lupita Leash at next visit -Due for retinal scan, performed today. -A1c in 3 months

## 2019-11-24 NOTE — Progress Notes (Signed)
Retinal images were done and transmitted today.  

## 2019-11-24 NOTE — Progress Notes (Signed)
   CC: Diabetes follow up  HPI:  [name] is a 54 y.o.  F with a hx as noted below who presents for diabetes follow up and R knee pain. Please refer to the problem based charting for details.  Past Medical History:  Diagnosis Date  . Anxiety   . Arthritis   . Asthma   . Bipolar 1 disorder (HCC)   . Breast lump   . Cirrhosis (HCC)   . Depression   . Diabetes mellitus   . FH: chemotherapy   . Gout   . Hepatitis B infection   . High cholesterol   . Hyperglycemia   . Hypertension   . Liver cirrhosis (HCC)   . Lung nodules   . Neuropathy   . Panic disorder   . Vaginal discharge 01/25/2017  . Vertigo    Review of Systems:  All systems reviewed and are negative unless mentioned in the HPI.  Physical Exam:   Vitals:   11/24/19 1553  BP: (!) 140/95  Pulse: 90  Temp: 98 F (36.7 C)  SpO2: 100%  Weight: 191 lb 6.4 oz (86.8 kg)  Height: 5\' 1"  (1.549 m)   Physical Exam Constitutional:      Appearance: She is obese.  HENT:     Head: Normocephalic and atraumatic.  Eyes:     Extraocular Movements: Extraocular movements intact.  Cardiovascular:     Comments: Warm and well perfused Pulmonary:     Effort: Pulmonary effort is normal.  Musculoskeletal:     Right lower leg: No edema.     Left lower leg: No edema.     Comments: R knee medial join line tenderness to palpation. Negative anterior drawer test, posterior drawer test, McMurray's maneuver   Skin:    General: Skin is warm.  Neurological:     General: No focal deficit present.     Mental Status: She is alert and oriented to person, place, and time.    Assessment & Plan:   See Encounters Tab for problem based charting.  Patient discussed with Dr. 

## 2019-11-25 ENCOUNTER — Ambulatory Visit: Payer: Medicare HMO

## 2019-11-25 LAB — CMP14 + ANION GAP
ALT: 358 IU/L — ABNORMAL HIGH (ref 0–32)
AST: 343 IU/L — ABNORMAL HIGH (ref 0–40)
Albumin/Globulin Ratio: 0.8 — ABNORMAL LOW (ref 1.2–2.2)
Albumin: 3.5 g/dL — ABNORMAL LOW (ref 3.8–4.9)
Alkaline Phosphatase: 136 IU/L — ABNORMAL HIGH (ref 39–117)
Anion Gap: 14 mmol/L (ref 10.0–18.0)
BUN/Creatinine Ratio: 14 (ref 9–23)
BUN: 9 mg/dL (ref 6–24)
Bilirubin Total: 0.9 mg/dL (ref 0.0–1.2)
CO2: 23 mmol/L (ref 20–29)
Calcium: 9.6 mg/dL (ref 8.7–10.2)
Chloride: 101 mmol/L (ref 96–106)
Creatinine, Ser: 0.64 mg/dL (ref 0.57–1.00)
GFR calc Af Amer: 119 mL/min/{1.73_m2} (ref >59)
GFR calc non Af Amer: 104 mL/min/{1.73_m2} (ref >59)
Globulin, Total: 4.3 g/dL (ref 1.5–4.5)
Glucose: 218 mg/dL — ABNORMAL HIGH (ref 65–99)
Potassium: 4.2 mmol/L (ref 3.5–5.2)
Sodium: 138 mmol/L (ref 134–144)
Total Protein: 7.8 g/dL (ref 6.0–8.5)

## 2019-11-26 ENCOUNTER — Other Ambulatory Visit: Payer: Self-pay | Admitting: Internal Medicine

## 2019-11-26 NOTE — Telephone Encounter (Signed)
Called pt to see which medication she needs a refill . She stated pharmacy did not receive rx for med ordered on Monday. Meloxicam was sent to [facility] 4/12.  I called [facility], spoke to [place] - stated it was sent out via delivery.  Called pt back - stated she has not received any med yet and she was told the rx was not received when she called the pharmacy. Informed her I talked to [place]- stated she will call the pharm herself.

## 2019-11-26 NOTE — Telephone Encounter (Signed)
Pt is calling regarding pain medicine, pls contact pt [phone]   Medicine needs to go to [facility] [place], [place] - [address]

## 2019-11-30 ENCOUNTER — Other Ambulatory Visit: Payer: Self-pay

## 2019-11-30 ENCOUNTER — Ambulatory Visit (HOSPITAL_COMMUNITY)
Admission: EM | Admit: 2019-11-30 | Discharge: 2019-11-30 | Disposition: A | Discharge status: Home / Self Care | Payer: Medicare HMO | Attending: Family Medicine | Admitting: Family Medicine

## 2019-11-30 ENCOUNTER — Encounter (HOSPITAL_COMMUNITY): Payer: Self-pay

## 2019-11-30 DIAGNOSIS — N760 Acute vaginitis: Secondary | ICD-10-CM | POA: Insufficient documentation

## 2019-11-30 DIAGNOSIS — S0031XS Abrasion of nose, sequela: Secondary | ICD-10-CM

## 2019-11-30 LAB — POCT URINALYSIS DIP (DEVICE)
Glucose, UA: 250 mg/dL — AB
Hgb urine dipstick: NEGATIVE
Ketones, ur: NEGATIVE mg/dL
Leukocytes,Ua: NEGATIVE
Nitrite: NEGATIVE
Protein, ur: NEGATIVE mg/dL
Specific Gravity, Urine: >=1.03 (ref 1.005–1.030)
Urobilinogen, UA: 2 mg/dL — ABNORMAL HIGH (ref 0.0–1.0)
pH: 5.5 (ref 5.0–8.0)

## 2019-11-30 MED ORDER — FLUCONAZOLE 150 MG PO TABS: 150.0000 mg | ORAL_TABLET | Freq: Once | ORAL | 0 refills | Status: AC

## 2019-11-30 MED ORDER — MUPIROCIN 2 % EX OINT: 1.0000 "application " | TOPICAL_OINTMENT | Freq: Three times a day (TID) | CUTANEOUS | 1 refills | Status: DC

## 2019-11-30 NOTE — ED Triage Notes (Signed)
C/o vaginal irritation. Requesting STI testing, except for HIV. Also complaining of nose irritation believed to be related to pollen/allergies.

## 2019-11-30 NOTE — Discharge Instructions (Addendum)
The nose should heal in a week.  If not, return for recheck  We'll have STD tests back in a day  The urine shows mild elevation of sugar which often leads to a yeast infection.  Be careful not to eat foods high in sugar.
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
## 2019-12-01 ENCOUNTER — Ambulatory Visit (INDEPENDENT_AMBULATORY_CARE_PROVIDER_SITE_OTHER): Payer: Medicare HMO | Admitting: Internal Medicine

## 2019-12-01 ENCOUNTER — Telehealth (HOSPITAL_COMMUNITY): Payer: Self-pay

## 2019-12-01 DIAGNOSIS — N760 Acute vaginitis: Secondary | ICD-10-CM | POA: Diagnosis not present

## 2019-12-01 DIAGNOSIS — B9689 Other specified bacterial agents as the cause of diseases classified elsewhere: Secondary | ICD-10-CM

## 2019-12-01 DIAGNOSIS — M1711 Unilateral primary osteoarthritis, right knee: Secondary | ICD-10-CM

## 2019-12-01 LAB — RPR: RPR Ser Ql: NONREACTIVE

## 2019-12-01 LAB — CERVICOVAGINAL ANCILLARY ONLY
Bacterial Vaginitis (gardnerella): POSITIVE — AB
Candida Glabrata: NEGATIVE
Candida Vaginitis: NEGATIVE
Chlamydia: NEGATIVE
Comment: NEGATIVE
Comment: NEGATIVE
Comment: NEGATIVE
Comment: NEGATIVE
Comment: NEGATIVE
Comment: NORMAL
Neisseria Gonorrhea: NEGATIVE
Trichomonas: NEGATIVE

## 2019-12-01 MED ORDER — METRONIDAZOLE 500 MG PO TABS: 500.0000 mg | ORAL_TABLET | Freq: Two times a day (BID) | ORAL | 0 refills | Status: AC

## 2019-12-01 MED ORDER — METRONIDAZOLE 500 MG PO TABS: 500.0000 mg | ORAL_TABLET | Freq: Two times a day (BID) | ORAL | 0 refills | Status: DC

## 2019-12-01 MED ORDER — CAPSAICIN 0.025 % EX GEL: 4.0000 "application " | Freq: Three times a day (TID) | CUTANEOUS | 3 refills | Status: DC

## 2019-12-01 NOTE — Progress Notes (Signed)
  [facility] Internal [facility] Residency Telephone Encounter Continuity Care Appointment  HPI:   This telephone encounter was created for [name] on 12/01/2019 for the following purpose/cc knee pain.    Past Medical History:  Past Medical History:  Diagnosis Date  . Anxiety   . Arthritis   . Asthma   . Bipolar 1 disorder ([facility])   . Breast lump   . Cirrhosis ([facility])   . Depression   . Diabetes mellitus   . FH: chemotherapy   . Gout   . Hepatitis B infection   . High cholesterol   . Hyperglycemia   . Hypertension   . Liver cirrhosis ([facility])   . Lung nodules   . Neuropathy   . Panic disorder   . Vaginal discharge 01/25/2017  . Vertigo       ROS:  Review of Systems  Constitutional: Negative for chills, fever, malaise/fatigue and weight loss.  Cardiovascular: Negative for chest pain, palpitations, orthopnea and claudication.  Gastrointestinal: Negative for abdominal pain, constipation, diarrhea, nausea and vomiting.  Musculoskeletal: Positive for joint pain.       Right knee is warm, painful     Assessment / Plan / Recommendations:   Please see A&P under problem oriented charting for assessment of the patient's acute and chronic medical conditions.   As always, pt is advised that if symptoms worsen or new symptoms arise, they should go to an urgent care facility or to to ER for further evaluation.   Consent and Medical Decision Making:   Patient discussed with Dr. Heide Spark  This is a telephone encounter between Hannah Young and Dolan Amen on 12/01/2019 for knee pain. The visit was conducted with the patient located at home and Dolan Amen at [facility]. The patient's identity was confirmed using their DOB and current address. The patient has consented to being evaluated through a telephone encounter and understands the associated risks (an examination cannot be done and the patient may need to come in for an appointment) / benefits (allows the patient to remain  at home, decreasing exposure to coronavirus). I personally spent 14 minutes on medical discussion.

## 2019-12-03 ENCOUNTER — Encounter: Payer: Self-pay | Admitting: Internal Medicine

## 2019-12-03 ENCOUNTER — Ambulatory Visit: Payer: Medicare HMO

## 2019-12-03 NOTE — Assessment & Plan Note (Signed)
Patient went to the ED for STI testing, found to have positive BV on results.   Plan:  - Metronidazole 500 BID

## 2019-12-03 NOTE — Assessment & Plan Note (Signed)
Patient with Hx of osteoarthritis of the R knee presents for a telehealth visit. She states that the mobic that she she uses has not alleviated her pain. She states that she takes the medication as directed, and will occasionally double her mobic dose, to no effect.   Plan - Schedule patient for appointment 12/03/2019 for evaluation for knee injection.  - Capsaicin cream

## 2019-12-04 ENCOUNTER — Ambulatory Visit: Payer: Medicare HMO

## 2019-12-04 ENCOUNTER — Encounter (INDEPENDENT_AMBULATORY_CARE_PROVIDER_SITE_OTHER): Payer: Medicare HMO | Admitting: Dietician

## 2019-12-04 ENCOUNTER — Telehealth: Payer: Self-pay | Admitting: *Deleted

## 2019-12-04 DIAGNOSIS — E119 Type 2 diabetes mellitus without complications: Secondary | ICD-10-CM | POA: Diagnosis not present

## 2019-12-04 DIAGNOSIS — Z794 Long term (current) use of insulin: Secondary | ICD-10-CM

## 2019-12-04 NOTE — Telephone Encounter (Addendum)
Call to patient about missed appointment.  Stated that daughter had to take her child to the doctor and was unable to bring patient in. Patient stated that she does have transportation issues.  Will need a 3 day notice to get transportation scheduled.   Attempts to reschedule patient.  Will need to have patient call back for an appointment when the Attending Physicians who do knee injections are scheduled.  Angelina Ok, RN 12/04/2019 2:51 PM.

## 2019-12-09 ENCOUNTER — Other Ambulatory Visit: Payer: Self-pay

## 2019-12-09 NOTE — Telephone Encounter (Signed)
Requesting to speak with a nurse about getting pain med, please call pt back.  

## 2019-12-09 NOTE — Telephone Encounter (Signed)
We last discussed her headaches in January 2021.  The headache started after a fall in November and were felt to be a postconcussive syndrome.  No physician has discussed her headaches since then.  If she is still having headaches, I agree that she needs to be seen for an examination to determine if imaging or referral is needed.  We cannot call in pain medicines without knowing why she continues to have headaches.

## 2019-12-09 NOTE — Telephone Encounter (Signed)
Called pt back and she stated she needed to talk to someone else on the phone, she would call when she was ready to come in

## 2019-12-09 NOTE — Telephone Encounter (Signed)
rtc to pt, she was offered an appt everday this week and also 5/4 with her pcp. She refused all and became angry"why do I have to keep coming up there to have them do something and they dont. I also need an xray of my head cause I keep having these h/a's", again offered appt in [facility] or w/pcp. Refused again. Informed pt I would send this request to pcp and last md she saw. Dr winters is on vacation. Dr Lyn Hollingshead please advise very soon or call pt with plan

## 2019-12-10 NOTE — Progress Notes (Signed)
Internal Medicine Clinic Attending  Case discussed with Dr. Winters at the time of the visit.  We reviewed the resident's history and exam and pertinent patient test results.  I agree with the assessment, diagnosis, and plan of care documented in the resident's note.  

## 2019-12-11 NOTE — Progress Notes (Signed)
Internal Medicine Clinic Attending  Case discussed with Dr. Wade Sigala at the time of the visit.  We reviewed the resident's history and exam and pertinent patient test results.  I agree with the assessment, diagnosis, and plan of care documented in the resident's note.  Marbella Markgraf, M.D., Ph.D.  

## 2019-12-15 ENCOUNTER — Emergency Department (HOSPITAL_COMMUNITY): Payer: Medicare HMO

## 2019-12-15 ENCOUNTER — Other Ambulatory Visit: Payer: Self-pay

## 2019-12-15 ENCOUNTER — Emergency Department (HOSPITAL_COMMUNITY)
Admission: EM | Admit: 2019-12-15 | Discharge: 2019-12-15 | Disposition: A | Discharge status: Home / Self Care | Payer: Medicare HMO | Attending: Emergency Medicine | Admitting: Emergency Medicine

## 2019-12-15 ENCOUNTER — Ambulatory Visit: Payer: Medicare HMO | Attending: Internal Medicine

## 2019-12-15 ENCOUNTER — Encounter (HOSPITAL_COMMUNITY): Payer: Self-pay | Admitting: Emergency Medicine

## 2019-12-15 DIAGNOSIS — R1030 Lower abdominal pain, unspecified: Secondary | ICD-10-CM | POA: Diagnosis not present

## 2019-12-15 DIAGNOSIS — R519 Headache, unspecified: Secondary | ICD-10-CM | POA: Diagnosis not present

## 2019-12-15 DIAGNOSIS — J45909 Unspecified asthma, uncomplicated: Secondary | ICD-10-CM | POA: Insufficient documentation

## 2019-12-15 DIAGNOSIS — I10 Essential (primary) hypertension: Secondary | ICD-10-CM | POA: Diagnosis not present

## 2019-12-15 DIAGNOSIS — B001 Herpesviral vesicular dermatitis: Secondary | ICD-10-CM | POA: Insufficient documentation

## 2019-12-15 DIAGNOSIS — Z91040 Latex allergy status: Secondary | ICD-10-CM | POA: Insufficient documentation

## 2019-12-15 DIAGNOSIS — R300 Dysuria: Secondary | ICD-10-CM | POA: Insufficient documentation

## 2019-12-15 DIAGNOSIS — E119 Type 2 diabetes mellitus without complications: Secondary | ICD-10-CM | POA: Diagnosis not present

## 2019-12-15 DIAGNOSIS — R42 Dizziness and giddiness: Secondary | ICD-10-CM | POA: Insufficient documentation

## 2019-12-15 DIAGNOSIS — N390 Urinary tract infection, site not specified: Secondary | ICD-10-CM | POA: Diagnosis not present

## 2019-12-15 DIAGNOSIS — Z79899 Other long term (current) drug therapy: Secondary | ICD-10-CM | POA: Insufficient documentation

## 2019-12-15 DIAGNOSIS — Z794 Long term (current) use of insulin: Secondary | ICD-10-CM | POA: Insufficient documentation

## 2019-12-15 DIAGNOSIS — G4489 Other headache syndrome: Secondary | ICD-10-CM | POA: Diagnosis not present

## 2019-12-15 DIAGNOSIS — Z87891 Personal history of nicotine dependence: Secondary | ICD-10-CM | POA: Insufficient documentation

## 2019-12-15 LAB — URINALYSIS, ROUTINE W REFLEX MICROSCOPIC
Bilirubin Urine: NEGATIVE
Glucose, UA: NEGATIVE mg/dL
Hgb urine dipstick: NEGATIVE
Ketones, ur: NEGATIVE mg/dL
Nitrite: NEGATIVE
Protein, ur: NEGATIVE mg/dL
Specific Gravity, Urine: 1.021 (ref 1.005–1.030)
WBC, UA: >50 WBC/hpf — ABNORMAL HIGH (ref 0–5)
pH: 5 (ref 5.0–8.0)

## 2019-12-15 LAB — I-STAT CHEM 8, ED
BUN: 17 mg/dL (ref 6–20)
Calcium, Ion: 1.25 mmol/L (ref 1.15–1.40)
Chloride: 101 mmol/L (ref 98–111)
Creatinine, Ser: 0.5 mg/dL (ref 0.44–1.00)
Glucose, Bld: 236 mg/dL — ABNORMAL HIGH (ref 70–99)
HCT: 40 % (ref 36.0–46.0)
Hemoglobin: 13.6 g/dL (ref 12.0–15.0)
Potassium: 4.5 mmol/L (ref 3.5–5.1)
Sodium: 138 mmol/L (ref 135–145)
TCO2: 28 mmol/L (ref 22–32)

## 2019-12-15 LAB — I-STAT BETA HCG BLOOD, ED (MC, WL, AP ONLY): I-stat hCG, quantitative: <5 m[IU]/mL (ref <5)

## 2019-12-15 MED ORDER — CEPHALEXIN 500 MG PO CAPS: 500.0000 mg | ORAL_CAPSULE | Freq: Two times a day (BID) | ORAL | 0 refills | Status: AC

## 2019-12-15 MED ORDER — DIPHENHYDRAMINE HCL 50 MG/ML IJ SOLN
12.5000 mg | Freq: Once | INTRAMUSCULAR | Status: AC
  Administered 2019-12-15: 12.5 mg via INTRAVENOUS
  Filled 2019-12-15: qty 1

## 2019-12-15 MED ORDER — PROCHLORPERAZINE EDISYLATE 10 MG/2ML IJ SOLN
10.0000 mg | Freq: Once | INTRAMUSCULAR | Status: AC
  Administered 2019-12-15: 10 mg via INTRAVENOUS
  Filled 2019-12-15: qty 2

## 2019-12-15 MED ORDER — VALACYCLOVIR HCL 1 G PO TABS: 2000.0000 mg | ORAL_TABLET | Freq: Two times a day (BID) | ORAL | 0 refills | Status: AC

## 2019-12-15 MED ORDER — KETOROLAC TROMETHAMINE 15 MG/ML IJ SOLN
15.0000 mg | Freq: Once | INTRAMUSCULAR | Status: AC
  Administered 2019-12-15: 14:00:00 15 mg via INTRAVENOUS
  Filled 2019-12-15: qty 1

## 2019-12-15 NOTE — ED Triage Notes (Signed)
Pt here with general aches and pain , pt was switched to mobic for the arthritis pain but she states that it is not working
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
## 2019-12-15 NOTE — Addendum Note (Signed)
Addended by: Neomia Dear on: 12/15/2019 06:16 PM   Modules accepted: Orders

## 2019-12-15 NOTE — ED Notes (Signed)
Discharge instructions discussed with patient, patient verbalized understanding and left department NAD

## 2019-12-15 NOTE — Discharge Instructions (Signed)
You were given a prescription for antibiotics. Please take the antibiotic prescription fully.   You were given a prescription for valacyclovir for the cold sore on your lip.  In regards to your headache you were given an ambulatory referral to neurology.  The neurologist will reach out to you to schedule appointment for follow-up.  Please make an appointment with your regular doctor to be rechecked in the next week.  Return the emergency department for any new or worsening symptoms in the meantime.

## 2019-12-22 ENCOUNTER — Encounter: Payer: Self-pay | Admitting: *Deleted

## 2020-01-27 ENCOUNTER — Other Ambulatory Visit: Payer: Self-pay | Admitting: Internal Medicine

## 2020-01-27 ENCOUNTER — Other Ambulatory Visit: Payer: Self-pay | Admitting: *Deleted

## 2020-01-27 DIAGNOSIS — E118 Type 2 diabetes mellitus with unspecified complications: Secondary | ICD-10-CM

## 2020-01-27 DIAGNOSIS — Z794 Long term (current) use of insulin: Secondary | ICD-10-CM

## 2020-01-27 MED ORDER — INSULIN ASPART 100 UNIT/ML ~~LOC~~ SOLN: SUBCUTANEOUS | 1 refills | Status: DC

## 2020-01-28 ENCOUNTER — Ambulatory Visit: Payer: Medicare Other | Admitting: Neurology

## 2020-01-29 ENCOUNTER — Other Ambulatory Visit: Payer: Self-pay | Admitting: *Deleted

## 2020-01-29 DIAGNOSIS — I10 Essential (primary) hypertension: Secondary | ICD-10-CM

## 2020-01-29 MED ORDER — LISINOPRIL 10 MG PO TABS: 10.0000 mg | ORAL_TABLET | Freq: Every day | ORAL | 1 refills | Status: DC

## 2020-02-12 ENCOUNTER — Encounter: Payer: Self-pay | Admitting: Internal Medicine

## 2020-02-12 ENCOUNTER — Ambulatory Visit (INDEPENDENT_AMBULATORY_CARE_PROVIDER_SITE_OTHER): Payer: Medicare HMO | Admitting: Internal Medicine

## 2020-02-12 ENCOUNTER — Other Ambulatory Visit (HOSPITAL_COMMUNITY)
Admission: RE | Admit: 2020-02-12 | Discharge: 2020-02-12 | Disposition: A | Discharge status: Home / Self Care | Payer: Medicare HMO | Source: Ambulatory Visit | Attending: Internal Medicine | Admitting: Internal Medicine

## 2020-02-12 ENCOUNTER — Telehealth: Payer: Self-pay | Admitting: Internal Medicine

## 2020-02-12 ENCOUNTER — Other Ambulatory Visit: Payer: Self-pay

## 2020-02-12 VITALS — BP 123/93 | HR 90 | Temp 98.3°F | Ht 62.0 in | Wt 187.3 lb

## 2020-02-12 DIAGNOSIS — E119 Type 2 diabetes mellitus without complications: Secondary | ICD-10-CM

## 2020-02-12 DIAGNOSIS — R300 Dysuria: Secondary | ICD-10-CM | POA: Diagnosis not present

## 2020-02-12 DIAGNOSIS — M8949 Other hypertrophic osteoarthropathy, multiple sites: Secondary | ICD-10-CM

## 2020-02-12 DIAGNOSIS — M159 Polyosteoarthritis, unspecified: Secondary | ICD-10-CM

## 2020-02-12 DIAGNOSIS — Z794 Long term (current) use of insulin: Secondary | ICD-10-CM | POA: Diagnosis not present

## 2020-02-12 DIAGNOSIS — B9689 Other specified bacterial agents as the cause of diseases classified elsewhere: Secondary | ICD-10-CM | POA: Diagnosis not present

## 2020-02-12 DIAGNOSIS — M1711 Unilateral primary osteoarthritis, right knee: Secondary | ICD-10-CM

## 2020-02-12 DIAGNOSIS — A64 Unspecified sexually transmitted disease: Secondary | ICD-10-CM | POA: Diagnosis not present

## 2020-02-12 DIAGNOSIS — N760 Acute vaginitis: Secondary | ICD-10-CM | POA: Insufficient documentation

## 2020-02-12 DIAGNOSIS — I10 Essential (primary) hypertension: Secondary | ICD-10-CM

## 2020-02-12 DIAGNOSIS — M1990 Unspecified osteoarthritis, unspecified site: Secondary | ICD-10-CM | POA: Insufficient documentation

## 2020-02-12 LAB — GLUCOSE, CAPILLARY: Glucose-Capillary: 210 mg/dL — ABNORMAL HIGH (ref 70–99)

## 2020-02-12 LAB — POCT GLYCOSYLATED HEMOGLOBIN (HGB A1C): Hemoglobin A1C: 5.9 % — AB (ref 4.0–5.6)

## 2020-02-12 MED ORDER — IBUPROFEN 600 MG PO TABS: 600.0000 mg | ORAL_TABLET | Freq: Three times a day (TID) | ORAL | 0 refills | Status: DC | PRN

## 2020-02-12 NOTE — Assessment & Plan Note (Signed)
Patient endorsing dysuria for 3 weeks duration.  She also endorses few episodes of urinary incontinence.  He denies any fevers or chills.  Plan Urinalysis

## 2020-02-12 NOTE — Patient Instructions (Addendum)
Hannah Young,  It was a pleasure seeing you in clinic. Today we discussed:   Vaginal discharge: Your physical exam was normal. We did a pap smear.  I will call you and update you with the results and any medications that you may need.   Urinary symptoms: We did a urine test and I will call you with the results.   Knee and shoulder pain: I have sent in a prescription for ibuprofen 600mg  every 8 hours as needed. You may schedule an appointment in one week for injections in your knee and shoulder.   If you have any questions or concerns, please call our clinic at [phone] between 9am-5pm and after hours call [phone] and ask for the internal medicine resident on call. If you feel you are having a medical emergency please call 911.   Thank you, we look forward to helping you remain healthy!

## 2020-02-12 NOTE — Assessment & Plan Note (Signed)
BP 123/93 at this visit. Patient is on lisinopril 10mg  daily and denies any issues with adherence. She denies any chest pain, shortness of breath, headaches or vision changes. Renal function stable on CMP in April 2021.   Plan: Continue lisinopril 10mg  daily  F/u in 3 months for BP and BMP monitoring

## 2020-02-12 NOTE — Assessment & Plan Note (Addendum)
Patient presenting with right shoulder and right knee pain that have been chronic but persistent. She notes that the shoulder pain is causing her significant distress and is waking her from sleep. She has tried ibuprofen for this which provides some relief; she notes minimal relief with voltaren gel. She also endorses ongoing right knee pain that is worse with walking.   On examination, minimal tenderness to palpation of the right shoulder, more significant at the humeral head.  No warmth or tenderness appreciated.  Limited range of motion, especially with abduction above 90 degrees.  Right knee with significant tenderness to palpation at the distal patella.  No effusion noted.  No significant limited ROM.  Overall, gait is normal.  We will continue conservative management at this point with ibuprofen 600 mg as needed and Voltaren gel.  Patient may benefit from steroid injection.  She is agreeable with this.  Patient is requesting a handicap placard and rolling walker to assist with ambulation.  Plan: Ibuprofen 600mg  q8h prn RTC 1 week for possible shoulder and knee injection

## 2020-02-12 NOTE — Telephone Encounter (Signed)
Pt is wanting something for  menopause [phone]
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
## 2020-02-12 NOTE — Telephone Encounter (Signed)
Pt requesting a call back.  Pt states she was seen this morning by Dr. Mcarthur Rossetti and has questions about her Medications.

## 2020-02-12 NOTE — Progress Notes (Signed)
   CC: R knee and shoulder pain, vaginal discharge and dysuria   HPI:  [name] is a 54 y.o. female with PMHx as listed below presenting with ongoing right knee and shoulder pain that appears to be stable but continues to cause significant distress and limits her daily activities. She also endorses vaginal discharge that she describes as cottage cheese and dysuria that has been present for the past 3 weeks. Denies any fevers or chills. Please see problem based charting for complete assessment and plan.   Past Medical History:  Diagnosis Date  . Anxiety   . Arthritis   . Asthma   . Bipolar 1 disorder (HCC)   . Breast lump   . Cirrhosis (HCC)   . Depression   . Diabetes mellitus   . FH: chemotherapy   . Gout   . Hepatitis B infection   . High cholesterol   . Hyperglycemia   . Hypertension   . Liver cirrhosis (HCC)   . Lung nodules   . Neuropathy   . Panic disorder   . Vaginal discharge 01/25/2017  . Vertigo    Review of Systems:  Negative except as stated in HPI.  Physical Exam:  Vitals:   02/12/20 0831  BP: (!) 123/93  Pulse: 90  Temp: 98.3 F (36.8 C)  TempSrc: Oral  SpO2: 100%  Weight: 187 lb 4.8 oz (85 kg)  Height: 5\' 2"  (1.575 m)   Physical Exam  Constitutional: Appears well-developed and well-nourished. No distress.  Head: Normocephalic and atraumatic. EOMI, MMM, conjunctivae nl Cardiovascular: Normal rate, regular rhythm and normal heart sounds, no m/r/g, distal pulses intact Respiratory: No respiratory distress; effort normal; no wheezing, rhonchi or rales on auscultation GI: Soft. Bowel sounds are normal. No distension. Mild suprapubic tenderness to deep palpation Musculoskeletal: Normal bulk and tone; no edema. Right shoulder with minimal tenderness on examination, abduction above 90 degrees limited by pain; no warmth or erythema Right knee with tenderness to palpation at distal patella; no effusions noted; no warmth or erythema. ROM wnl, gait  intact  Neurological: Is alert and oriented x4; no apparent focal deficits noted  Skin: Warm and dry; no lesions, erythema or rashes noted  Gynecologic: no lesions noted at the labia majora or minora; no significant adnexal tenderness on vaginal exam; cervix visualized during pap smear - without any abnormal discharge or lesions noted.  Psychiatric: Normal mood and affect. Behavior is normal. Judgment and thought content normal.    Assessment & Plan:   See Encounters Tab for problem based charting.  Patient discussed with Dr. 

## 2020-02-12 NOTE — Telephone Encounter (Signed)
RTC to patient. Discussed that she would need to be evaluated by GI for her autoimmune hepatitis prior to hormone therapy for the menopause. She expresses understanding.

## 2020-02-13 ENCOUNTER — Telehealth: Payer: Self-pay

## 2020-02-13 LAB — URINALYSIS, ROUTINE W REFLEX MICROSCOPIC
Bilirubin, UA: NEGATIVE
Glucose, UA: NEGATIVE
Ketones, UA: NEGATIVE
Leukocytes,UA: NEGATIVE
Nitrite, UA: NEGATIVE
Protein,UA: NEGATIVE
RBC, UA: NEGATIVE
Specific Gravity, UA: 1.023 (ref 1.005–1.030)
Urobilinogen, Ur: 1 mg/dL (ref 0.2–1.0)
pH, UA: 6 (ref 5.0–7.5)

## 2020-02-13 LAB — CERVICOVAGINAL ANCILLARY ONLY
Bacterial Vaginitis (gardnerella): NEGATIVE
Candida Glabrata: NEGATIVE
Candida Vaginitis: NEGATIVE
Chlamydia: NEGATIVE
Comment: NEGATIVE
Comment: NEGATIVE
Comment: NEGATIVE
Comment: NEGATIVE
Comment: NEGATIVE
Comment: NORMAL
Neisseria Gonorrhea: NEGATIVE
Trichomonas: NEGATIVE

## 2020-02-13 LAB — HIV ANTIBODY (ROUTINE TESTING W REFLEX): HIV Screen 4th Generation wRfx: NONREACTIVE

## 2020-02-13 LAB — RPR: RPR Ser Ql: NONREACTIVE

## 2020-02-13 NOTE — Progress Notes (Signed)
Internal Medicine Clinic Attending ° °Case discussed with Dr. Aslam  At the time of the visit.  We reviewed the resident’s history and exam and pertinent patient test results.  I agree with the assessment, diagnosis, and plan of care documented in the resident’s note.  °

## 2020-02-13 NOTE — Telephone Encounter (Signed)
Requesting test results, please call pt back.  

## 2020-02-16 ENCOUNTER — Telehealth (HOSPITAL_COMMUNITY): Payer: Self-pay

## 2020-02-16 NOTE — Telephone Encounter (Signed)
Pt phoned asking results fo RPR, cervical cytology and u/a. Advised pt all results "negative or WNL" and to follow up with her PMD as needed. Pt verbalized understanding.

## 2020-02-17 ENCOUNTER — Telehealth (HOSPITAL_COMMUNITY): Payer: Self-pay

## 2020-02-17 ENCOUNTER — Telehealth: Payer: Self-pay | Admitting: Internal Medicine

## 2020-02-17 NOTE — Telephone Encounter (Signed)
Pls contact pt regarding injections in knee/nw

## 2020-02-17 NOTE — Telephone Encounter (Signed)
Anytime Thursday and Friday is fine for [place]. Lots of attendings available.

## 2020-02-17 NOTE — Telephone Encounter (Signed)
Pt wants to come in for a knee injection, dr's Mullen,Vincent and Hoffman are the attendings that do knee injections. Will send to them for scheduling. Pt wants it done this week but she does not have transportation available and needs 3 days notice for medicare transportation. She states that her md told her last week the clinic could do it this week, pt wants [facility] to provide transportation, told her it may not happen til next week. Please advise best time for attendings and charsetta, doris, can we do transportation? Sending to dr's mullen, vincent, hoffman and doris, charsetta

## 2020-02-17 NOTE — Telephone Encounter (Signed)
Per charsetta, she will arrange transportation for fri  appt 1345, informed pt to be ready at 1130 for transport to clinic, she is agreeable

## 2020-02-19 NOTE — Telephone Encounter (Signed)
Called patient to discuss results. Patient made aware that although HIV and RPR were negative, pap smear is positive for high-risk HPV and LSIL. Discussed that will follow up on this with genotype testing. Patient advised to use condom during sexual activity and will follow up on this and for need to refer to gynecology.

## 2020-02-20 ENCOUNTER — Encounter: Payer: Self-pay | Admitting: Internal Medicine

## 2020-02-20 ENCOUNTER — Ambulatory Visit (INDEPENDENT_AMBULATORY_CARE_PROVIDER_SITE_OTHER): Payer: Medicare HMO | Admitting: Internal Medicine

## 2020-02-20 VITALS — BP 133/92 | HR 95 | Temp 98.9°F | Ht 62.0 in | Wt 189.7 lb

## 2020-02-20 DIAGNOSIS — M1711 Unilateral primary osteoarthritis, right knee: Secondary | ICD-10-CM | POA: Diagnosis not present

## 2020-02-20 DIAGNOSIS — M7521 Bicipital tendinitis, right shoulder: Secondary | ICD-10-CM | POA: Diagnosis not present

## 2020-02-20 LAB — SYNOVIAL CELL COUNT + DIFF, W/ CRYSTALS
Crystals, Fluid: NONE SEEN
WBC, Synovial: UNDETERMINED /mm3 (ref 0–200)

## 2020-02-20 NOTE — Progress Notes (Signed)
Internal Medicine Clinic Attending  I saw and evaluated the patient.  I personally confirmed the key portions of the history and exam documented by Dr. Lee and I reviewed pertinent patient test results.  The assessment, diagnosis, and plan were formulated together and I agree with the documentation in the resident's note.  

## 2020-02-20 NOTE — Assessment & Plan Note (Addendum)
Mrs.Thurmond is a 54 yo F w/ PMH of HTN, Hepatitis, DM, DJD presenting to [facility] w/ complaint of chronic knee pain. She mentions that she has had chronic pain of her knee for years due to arthritis. She states previous X-ray has show significant degeneration of joint and she is planning on pursuing knee replacement surgery once her blood sugar is better controlled as she was refused surgery in the past. She states she received a knee injection 3 months prior and had significant relief with the injection. About a week ago her chronic pain returned and she has been managing her pain with ibuprofen since. She requests another injection to alleviate the pain.  A/P Presents w/ exacerbation of chronic knee pain due to osteoarthritis. Prior X-ray w/ narrowing joint space. She does have hx of gout. Drainable effusion noted on point of care ultrasound. Will perform arthrocentesis and provide kenalog injection to joint space.  - R knee arthrocentesis + injection - Cell count w/crystal for synovial fluid  Consent obtained and verified. Time-out conducted. Noted no overlying erythema, induration, or other signs of local infection. Skin prepped in a sterile fashion. Topical analgesic spray: Ethyl chloride. 1% lidocaine introduced from skin to joint space 45ml of serosanguinous synovial fluid drained for lab 40mg  kenalog injected into space without difficulty  Joint: R knee Needle: 21 gauge Meds: 1% Lidocaine 2cc, kenalog 40mg   Advised to call if fevers/chills, erythema, induration, drainage, or persistent bleeding.

## 2020-02-20 NOTE — Assessment & Plan Note (Signed)
Hannah Young presents w/ subacute pain of her right shoulder. She was in her usual state of health until 2 months ago when she developed acute onset pain of her right shoulder without inciting event. She mentions having stabbing 10/10 intermittent pain of her right shoulder. Mentions comes and goes without obvious exacerbating factors. Ibuprofen provides mild relief. Mentions full range of motion intact and denies any fevers, chills, nausea, vomiting.  A/P Presents w/ subacute shoulder pain. Anterior tenderness to palpation. Intact range of motion on physical exam without evidence of impingement or rotator cuff injury. Point of care ultrasound confirm long head tendonitis at proximal biceps. Discussed risk of tendon rupture with steroid injections. Hannah Young expressed understanding.  - NSAIDS prn - Advised on shoulder stretches

## 2020-02-20 NOTE — Progress Notes (Signed)
CC: Knee pain  HPI: [name] is a 54 y.o. with PMH listed below presenting with complaint of knee pain. Please see problem based assessment and plan for further details.  Past Medical History:  Diagnosis Date  . Anxiety   . Arthritis   . Asthma   . Bipolar 1 disorder (HCC)   . Breast lump   . Cirrhosis (HCC)   . Depression   . Diabetes mellitus   . Fall 09/02/2019  . FH: chemotherapy   . Gout   . Hepatitis B infection   . High cholesterol   . Hyperglycemia   . Hypertension   . Liver cirrhosis (HCC)   . Lung nodules   . Neuropathy   . Panic disorder   . Vaginal discharge 01/25/2017  . Vertigo     Review of Systems: Review of Systems  Constitutional: Negative for chills, fever and malaise/fatigue.  Eyes: Negative for blurred vision.  Respiratory: Negative for shortness of breath.   Cardiovascular: Negative for chest pain.  Gastrointestinal: Negative for nausea and vomiting.  Musculoskeletal: Positive for back pain and joint pain.  All other systems reviewed and are negative.   Physical Exam: Vitals:   02/20/20 1317  BP: (!) 133/92  Pulse: 95  Temp: 98.9 F (37.2 C)  TempSrc: Oral  SpO2: 100%  Weight: 189 lb 11.2 oz (86 kg)  Height: 5\' 2"  (1.575 m)    Physical Exam Constitutional:      General: She is not in acute distress.    Appearance: She is normal weight.  HENT:     Head: Normocephalic and atraumatic.     Mouth/Throat:     Mouth: Mucous membranes are moist.     Pharynx: Oropharynx is clear.  Eyes:     Conjunctiva/sclera: Conjunctivae normal.  Cardiovascular:     Rate and Rhythm: Normal rate and regular rhythm.     Pulses: Normal pulses.     Heart sounds: No murmur heard.   Musculoskeletal:        General: Swelling (R knee tenderness at joint line with palpable effusion and warmth to touch without erythema. Active/Passive range of motion intact. Crepitus on extension) and tenderness (R shoulder tenderness to deep palpation on anterior  aspect. Active/Passive range of motion intact. Negative empty can test. ) present.  Skin:    General: Skin is warm and dry.  Neurological:     Mental Status: She is alert.      Assessment & Plan:   Primary osteoarthritis of right knee Hannah Young is a 54 yo F w/ PMH of HTN, Hepatitis, DM, DJD presenting to [facility] w/ complaint of chronic knee pain. She mentions that she has had chronic pain of her knee for years due to arthritis. She states previous X-ray has show significant degeneration of joint and she is planning on pursuing knee replacement surgery once her blood sugar is better controlled as she was refused surgery in the past. She states she received a knee injection 3 months prior and had significant relief with the injection. About a week ago her chronic pain returned and she has been managing her pain with ibuprofen since. She requests another injection to alleviate the pain.  A/P Presents w/ exacerbation of chronic knee pain due to osteoarthritis. Prior X-ray w/ narrowing joint space. She does have hx of gout. Drainable effusion noted on point of care ultrasound. Will perform arthrocentesis and provide kenalog injection to joint space.  - R knee arthrocentesis + injection - Cell  count w/crystal for synovial fluid  Consent obtained and verified. Time-out conducted. Noted no overlying erythema, induration, or other signs of local infection. Skin prepped in a sterile fashion. Topical analgesic spray: Ethyl chloride. 1% lidocaine introduced from skin to joint space 49ml of serosanguinous synovial fluid drained for lab 40mg  kenalog injected into space without difficulty  Joint: R knee Needle: 21 gauge Meds: 1% Lidocaine 2cc, kenalog 40mg   Advised to call if fevers/chills, erythema, induration, drainage, or persistent bleeding.  Biceps tendinitis of right shoulder Hannah Young presents w/ subacute pain of her right shoulder. She was in her usual state of health until 2 months ago when  she developed acute onset pain of her right shoulder without inciting event. She mentions having stabbing 10/10 intermittent pain of her right shoulder. Mentions comes and goes without obvious exacerbating factors. Ibuprofen provides mild relief. Mentions full range of motion intact and denies any fevers, chills, nausea, vomiting.  A/P Presents w/ subacute shoulder pain. Anterior tenderness to palpation. Intact range of motion on physical exam without evidence of impingement or rotator cuff injury. Point of care ultrasound confirm long head tendonitis at proximal biceps. Discussed risk of tendon rupture with steroid injections. Hannah Young expressed understanding.  - NSAIDS prn - Advised on shoulder stretches    Patient seen with Dr.  - , PGY3 [facility] Internal Medicine Pager: [phone]

## 2020-02-20 NOTE — Patient Instructions (Signed)
Thank you for allowing [place] to provide your care today. Today we discussed your knee and shoulder pain    I have ordered gout labs for you. I will call if any are abnormal.    Today we made no changes to your medications.    Please follow-up in 3 months.    Should you have any questions or concerns please call the internal medicine clinic at [phone].     Proximal Biceps Tendinitis and Tenosynovitis  The proximal biceps tendon is a strong cord of tissue that connects the biceps muscle on the front of the upper arm to the shoulder blade. Tendinitis is inflammation of a tendon. Tenosynovitis is inflammation of the lining around the tendon (tendon sheath). These conditions often occur at the same time, and they can interfere with the ability to bend the elbow and turn the palm of the hand up. Proximal biceps tendinitis and tenosynovitis are usually caused by overusing the shoulder joint and the biceps muscle. These conditions usually heal within 6 weeks. Proximal biceps tendinitis may include a grade 1 or grade 2 strain of the tendon.  A grade 1 strain is mild, and it involves a slight pull of the tendon without any stretching or noticeable tearing of the tendon. There is usually no loss of biceps muscle strength.  A grade 2 strain is moderate, and it involves a small tear in the tendon. The tendon is stretched, and biceps strength is usually decreased. What are the causes? This condition may be caused by:  A sudden increase in frequency or intensity of activity that involves the shoulder and the biceps muscle.  Overuse of the biceps muscle. This can happen when you do the same movements over and over, such as: ? Turning the palm of the hand up. ? Forceful straightening (hyperextension) of the elbow. ? Bending the elbow.  A direct, forceful hit or injury to the elbow. This is rare. What increases the risk? The following factors may make you more likely to develop this  condition:  Playing contact sports.  Playing sports that involve throwing and overhead movements, including racket sports, gymnastics, weight lifting, or bodybuilding.  Doing physical labor.  Having poor strength and flexibility of the arm and shoulder. What are the signs or symptoms? Symptoms of this condition may include:  Pain and inflammation in the front of the shoulder.  A feeling of warmth in the front of the shoulder.  Limited range of motion of the shoulder and the elbow.  A crackling sound (crepitation) when you move or touch the shoulder or the upper arm. In some cases, symptoms may return after treatment, and they may be long-lasting (chronic). How is this diagnosed? This condition is diagnosed based on:  Your symptoms.  Your medical history.  Physical exam.  X-ray or MRI, if needed. How is this treated? Treatment for this condition depends on the severity of your injury. It may include:  Resting the injured arm.  Icing the injured area.  Doing physical therapy. Your health care provider may also use:  Medicines to treat pain and inflammation.  Sound waves to treat the injured muscle (ultrasound therapy).  Medicines that are injected to the muscle (corticosteroids).  Medicines that numb the area (local anesthetics).  Surgery. This is done if other treatments have not worked. Follow these instructions at home: Managing pain, stiffness, and swelling      If directed, put ice on the injured area. ? Put ice in a plastic bag. ? Place  a towel between your skin and the bag. ? Leave the ice on for 20 minutes, 2-3 times a day.  If directed, apply heat to the affected area before you exercise. Use the heat source that your health care provider recommends, such as a moist heat pack or a heating pad. ? Place a towel between your skin and the heat source. ? Leave the heat on for 20-30 minutes. ? Remove the heat if your skin turns bright red. This is  especially important if you are unable to feel pain, heat, or cold. You may have a greater risk of getting burned.  Move your fingers often to reduce stiffness and swelling.  Raise (elevate) the injured area above the level of your heart while you are lying down. Activity  Do not lift anything that is heavier than 10 lb (4.5 kg), or the limit that you are told, until your health care provider says that it is safe.  Avoid activities that cause pain or make your condition worse.  Return to your normal activities as told by your health care provider. Ask your health care provider what activities are safe for you.  Do exercises as told by your health care provider. General instructions  Take over-the-counter and prescription medicines only as told by your health care provider.  Do not use any products that contain nicotine or tobacco, such as cigarettes, e-cigarettes, and chewing tobacco. These can delay healing. If you need help quitting, ask your health care provider.  Keep all follow-up visits as told by your health care provider. This is important. How is this prevented?  Warm up and stretch before being active.  Cool down and stretch after being active.  Give your body time to rest between periods of activity.  Make sure any equipment that you use is fitted to you.  Be safe and responsible while being active to avoid falls.  Maintain physical fitness, including: ? Strength. ? Flexibility. ? Heart health (cardiovascular fitness). ? The ability to use muscles for a long time (endurance). Contact a health care provider if:  You have symptoms that get worse or do not get better after 2 weeks of treatment.  You develop new symptoms. Get help right away if:  You develop severe pain. Summary  Tendinitis is inflammation of the biceps tendon. Tenosynovitis is inflammation of the lining around the biceps tendon. These conditions often occur at the same time.  These conditions  are usually caused by overusing the shoulder joint and biceps muscle.  Symptoms include pain, warmth in the shoulder, and limited range of motion.  The two conditions are treated with rest, ice, medicines, and surgery (rare). This information is not intended to replace advice given to you by your health care provider. Make sure you discuss any questions you have with your health care provider. Document Revised: 11/26/2018 Document Reviewed: 09/26/2018 Elsevier Patient Education  2020 [employer].

## 2020-02-23 ENCOUNTER — Telehealth: Payer: Self-pay | Admitting: Internal Medicine

## 2020-02-23 ENCOUNTER — Telehealth: Payer: Self-pay

## 2020-02-23 DIAGNOSIS — A64 Unspecified sexually transmitted disease: Secondary | ICD-10-CM

## 2020-02-23 DIAGNOSIS — M1711 Unilateral primary osteoarthritis, right knee: Secondary | ICD-10-CM

## 2020-02-23 NOTE — Telephone Encounter (Signed)
Spoke with [name] regarding her synovial fluid results. Reassured her that her pain appears to be due to osteoarthritis with no evidence of gout or septic arthritis. [name] expressed understanding but states her pain has not improved with the steroid injection and requesting alternative therapies. Discussed re-establishing care with ortho for further evaluation as she had been seen by Dr.Xu in the past. As she has failed conservative therapy with NSAIDs and Steroid injections, [name] agrees with the plan.
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
## 2020-02-23 NOTE — Telephone Encounter (Signed)
Requesting test results, please call back.  

## 2020-02-24 ENCOUNTER — Other Ambulatory Visit: Payer: Self-pay | Admitting: Internal Medicine

## 2020-02-24 DIAGNOSIS — E118 Type 2 diabetes mellitus with unspecified complications: Secondary | ICD-10-CM

## 2020-02-24 DIAGNOSIS — Z794 Long term (current) use of insulin: Secondary | ICD-10-CM

## 2020-02-24 DIAGNOSIS — M8949 Other hypertrophic osteoarthropathy, multiple sites: Secondary | ICD-10-CM

## 2020-02-24 DIAGNOSIS — M159 Polyosteoarthritis, unspecified: Secondary | ICD-10-CM

## 2020-02-24 LAB — CYTOLOGY - PAP
Adequacy: ABSENT
Comment: NEGATIVE
Comment: NEGATIVE
HPV 16: NEGATIVE
HPV 18 / 45: NEGATIVE
High risk HPV: POSITIVE — AB

## 2020-02-24 MED ORDER — IBUPROFEN 600 MG PO TABS: 600.0000 mg | ORAL_TABLET | Freq: Three times a day (TID) | ORAL | 0 refills | Status: DC | PRN

## 2020-02-24 NOTE — Telephone Encounter (Signed)
Refill Request-Pt requesting a call back   insulin detemir (LEVEMIR) 100 UNIT/ML injection  ibuprofen (ADVIL) 600 MG tablet  [facility] - [place], [place] - [address]

## 2020-02-25 ENCOUNTER — Telehealth: Payer: Self-pay | Admitting: Internal Medicine

## 2020-02-27 ENCOUNTER — Encounter: Payer: Self-pay | Admitting: Orthopaedic Surgery

## 2020-02-27 ENCOUNTER — Ambulatory Visit (INDEPENDENT_AMBULATORY_CARE_PROVIDER_SITE_OTHER): Payer: Medicare HMO | Admitting: Orthopaedic Surgery

## 2020-02-27 ENCOUNTER — Ambulatory Visit: Payer: Self-pay

## 2020-02-27 VITALS — Ht 61.0 in | Wt 187.8 lb

## 2020-02-27 DIAGNOSIS — M1711 Unilateral primary osteoarthritis, right knee: Secondary | ICD-10-CM
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
## 2020-03-03 ENCOUNTER — Telehealth: Payer: Self-pay

## 2020-03-03 ENCOUNTER — Telehealth: Payer: Self-pay | Admitting: Orthopaedic Surgery

## 2020-03-03 NOTE — Telephone Encounter (Signed)
Completed and signed Request for Independent Assessment for Personal Care Services Attestation of Medical Need faxed to [facility] at [phone]. Fax confirmation receipt received. L. Ruchi Stoney, BSN, RN-BC      

## 2020-03-03 NOTE — Telephone Encounter (Signed)
Patient called.   She said she was told to call if she wanted to proceed with surgery and she does.   Call back: [phone]

## 2020-03-03 NOTE — Telephone Encounter (Signed)
Returned call to patient. States she needs help with light cleaning, bathing, and meal prep 2/2 knee pain. Patient was seen on 02/20/2020 by Dr. Nedra Hai. Will forward request for PCS to [employer]. Kinnie Feil, BSN, RN-BC

## 2020-03-03 NOTE — Telephone Encounter (Signed)
Requesting PCS, please call pt back.  

## 2020-03-03 NOTE — Telephone Encounter (Signed)
Left blue sheet for you

## 2020-03-04 NOTE — Telephone Encounter (Signed)
Attempted to contact pt to make her aware that PCS forms has been faxed to [facility]. CMA also wanted to give pt [facility] contact info (ph# [phone] answer, unable to leave message on recorder.Kingsley Spittle Cassady7/22/20214:10 PM

## 2020-03-08 ENCOUNTER — Encounter: Payer: Medicare HMO | Admitting: Internal Medicine

## 2020-03-09 ENCOUNTER — Ambulatory Visit: Payer: Medicare HMO | Admitting: Physical Therapy

## 2020-03-11 ENCOUNTER — Encounter: Payer: Medicare HMO | Admitting: Internal Medicine

## 2020-03-11 NOTE — Telephone Encounter (Signed)
I called patient and no answer, no voice mail.

## 2020-03-18 ENCOUNTER — Encounter: Payer: Self-pay | Admitting: Obstetrics & Gynecology

## 2020-03-18 ENCOUNTER — Other Ambulatory Visit (HOSPITAL_COMMUNITY)
Admission: RE | Admit: 2020-03-18 | Discharge: 2020-03-18 | Disposition: A | Discharge status: Home / Self Care | Payer: Medicare HMO | Source: Ambulatory Visit | Attending: Obstetrics & Gynecology | Admitting: Obstetrics & Gynecology

## 2020-03-18 ENCOUNTER — Inpatient Hospital Stay (HOSPITAL_COMMUNITY): Admit: 2020-03-18 | Payer: Medicare HMO

## 2020-03-18 ENCOUNTER — Other Ambulatory Visit: Payer: Self-pay

## 2020-03-18 ENCOUNTER — Telehealth: Payer: Self-pay | Admitting: *Deleted

## 2020-03-18 ENCOUNTER — Ambulatory Visit (INDEPENDENT_AMBULATORY_CARE_PROVIDER_SITE_OTHER): Payer: Medicare HMO | Admitting: Obstetrics & Gynecology

## 2020-03-18 VITALS — BP 136/88 | HR 88 | Wt 188.0 lb

## 2020-03-18 DIAGNOSIS — R87612 Low grade squamous intraepithelial lesion on cytologic smear of cervix (LGSIL): Secondary | ICD-10-CM | POA: Insufficient documentation

## 2020-03-18 DIAGNOSIS — N870 Mild cervical dysplasia: Secondary | ICD-10-CM | POA: Diagnosis not present

## 2020-03-18 NOTE — Patient Instructions (Signed)
Colposcopy, Care After This sheet gives you information about how to care for yourself after your procedure. Your doctor may also give you more specific instructions. If you have problems or questions, contact your doctor. What can I expect after the procedure? If you did not have a tissue sample removed (did not have a biopsy), you may only have some spotting for a few days. You can go back to your normal activities. If you had a tissue sample removed, it is common to have:  Soreness and pain. This may last for a few days.  Light-headedness.  Mild bleeding from your vagina or dark-colored, grainy discharge from your vagina. This may last for a few days. You may need to wear a sanitary pad.  Spotting for at least 48 hours after the procedure. Follow these instructions at home:   Take over-the-counter and prescription medicines only as told by your doctor. Ask your doctor what medicines you can start taking again. This is very important if you take blood-thinning medicine.  Do not drive or use heavy machinery while taking prescription pain medicine.  For 3 days, or as long as your doctor tells you, avoid: ? Douching. ? Using tampons. ? Having sex.  If you use birth control (contraception), keep using it.  Limit activity for the first day after the procedure. Ask your doctor what activities are safe for you.  It is up to you to get the results of your procedure. Ask your doctor when your results will be ready.  Keep all follow-up visits as told by your doctor. This is important. Contact a doctor if:  You get a skin rash. Get help right away if:  You are bleeding a lot from your vagina. It is a lot of bleeding if you are using more than one pad an hour for 2 hours in a row.  You have clumps of blood (blood clots) coming from your vagina.  You have a fever.  You have chills  You have pain in your lower belly (pelvic area).  You have signs of infection, such as vaginal  discharge that is: ? Different than usual. ? Yellow. ? Bad-smelling.  You have very pain or cramps in your lower belly that do not get better with medicine.  You feel light-headed.  You feel dizzy.  You pass out (faint). Summary  If you did not have a tissue sample removed (did not have a biopsy), you may only have some spotting for a few days. You can go back to your normal activities.  If you had a tissue sample removed, it is common to have mild pain and spotting for 48 hours.  For 3 days, or as long as your doctor tells you, avoid douching, using tampons and having sex.  Get help right away if you have bleeding, very bad pain, or signs of infection. This information is not intended to replace advice given to you by your health care provider. Make sure you discuss any questions you have with your health care provider. Document Revised: 07/13/2017 Document Reviewed: 04/19/2016 Elsevier Patient Education  2020 Elsevier Inc.  

## 2020-03-18 NOTE — Progress Notes (Signed)
Pt is in office for Pap results, pt has had abn in the past.  Pt states she has not had any previous procedures on cervix.  UPT in office today is negative.  Pt states she would like repeat cultures with RPR today. Pt was instructed and preformed self swab. Pt states she has had recent intercourse with broken condom.

## 2020-03-18 NOTE — Telephone Encounter (Signed)
I agree with appointment to be evaluated in person. Thank you!

## 2020-03-18 NOTE — Telephone Encounter (Signed)
Pt states her shoulders have gone bad now, pain is 10/10, "down in the bone". I got to have something done for this pain" appt at pt choosing 8/10 at [facility]

## 2020-03-18 NOTE — Progress Notes (Signed)
Patient ID: Hannah Young, female   DOB: 07/09/1966, 54 y.o.   MRN: [id number]  Chief Complaint  Patient presents with  . New Patient (Initial Visit)    HPI Hannah Young is a 54 y.o. female.  [id number], No LMP recorded. (Menstrual status: Perimenopausal).   HPI  Indications: Pap smear on July 2021 showed: low-grade squamous intraepithelial neoplasia (LGSIL - encompassing HPV,mild dysplasia,CIN I). Previous colposcopy: unsure of result,. Prior cervical treatment: no treatment.  Past Medical History:  Diagnosis Date  . Anxiety   . Arthritis   . Asthma   . Bipolar 1 disorder (HCC)   . Breast lump   . Cirrhosis (HCC)   . Depression   . Diabetes mellitus   . Fall 09/02/2019  . FH: chemotherapy   . Gout   . Hepatitis B infection   . High cholesterol   . Hyperglycemia   . Hypertension   . Liver cirrhosis (HCC)   . Lung nodules   . Neuropathy   . Panic disorder   . Vaginal discharge 01/25/2017  . Vertigo     Past Surgical History:  Procedure Laterality Date  . Biopsy of liver    . CESAREAN SECTION    . LAPAROSCOPY    . TUBAL LIGATION      Family History  Problem Relation Age of Onset  . Liver disease Mother   . Kidney failure Father   . Colon cancer Neg Hx   . Stomach cancer Neg Hx     Social History Social History   Tobacco Use  . Smoking status: Current Every Day Smoker    Packs/day: 0.30    Types: Cigarettes    Last attempt to quit: 12/27/2017    Years since quitting: 2.2  . Smokeless tobacco: Never Used  . Tobacco comment: 2-3 cigs/day  Vaping Use  . Vaping Use: Never used  Substance Use Topics  . Alcohol use: Not Currently    Alcohol/week: 3.0 standard drinks    Types: 3 Cans of beer per week  . Drug use: No    Allergies  Allergen Reactions  . Augmentin [Amoxicillin-Pot Clavulanate] Anaphylaxis    Has patient had a PCN reaction causing immediate rash, facial/tongue/throat swelling, SOB or lightheadedness with hypotension: Yes Has patient had  a PCN reaction causing severe rash involving mucus membranes or skin necrosis: No Has patient had a PCN reaction that required hospitalization: No Has patient had a PCN reaction occurring within the last 10 years: Yes If all of the above answers are "NO", then may proceed with Cephalosporin use.   . Tylenol [Acetaminophen] Other (See Comments)    Patient has liver disease, MD has stated that SHE CANNOT TAKE ANY TYLENOL EVER.   . Latex Rash    Current Outpatient Medications  Medication Sig Dispense Refill  . diphenhydrAMINE (BENADRYL) 25 mg capsule Take 25 mg by mouth every 6 (six) hours as needed.    Marland Kitchen ibuprofen (ADVIL) 600 MG tablet Take 1 tablet (600 mg total) by mouth every 8 (eight) hours as needed. 30 tablet 0  . insulin aspart (NOVOLOG) 100 UNIT/ML injection Inject 5 Units into the skin 3 (three) times daily with meals. 30 mL 1  . LEVEMIR 100 UNIT/ML injection Inject 0.15 mLs (15 Units total) into the skin at bedtime. 10 mL 11  . lisinopril (ZESTRIL) 10 MG tablet Take 1 tablet (10 mg total) by mouth daily. 90 tablet 1  . meloxicam (MOBIC) 7.5 MG tablet Take 1 tablet (7.5 mg  total) by mouth daily. 30 tablet 0  . Accu-Chek Softclix Lancets lancets Use as instructed 100 each 12  . Capsaicin 0.025 % GEL Apply 4 application topically 3 (three) times daily. 60 g 3  . fluticasone (FLONASE) 50 MCG/ACT nasal spray Place 2 sprays into both nostrils daily as needed for allergies or rhinitis.     Marland Kitchen glucose blood (CONTOUR NEXT TEST) test strip Check blood sugar 3 times a day 100 each 12  . Insulin Pen Needle (ADVOCATE INSULIN PEN NEEDLES) 31G X 5 MM MISC 100 applicators by Does not apply route 4 (four) times daily. 100 each 12  . mupirocin ointment (BACTROBAN) 2 % Apply 1 application topically 3 (three) times daily. 22 g 1  . SURE COMFORT INSULIN SYRINGE 31G X 5/16" 0.3 ML MISC USE TO INJECT INSULIN TWICE DAILY 100 each 2  . triamcinolone cream (KENALOG) 0.1 % Apply 1 application topically 2 (two)  times daily. Thin amount to face for rash/itching (Patient taking differently: Apply 1 application topically 2 (two) times daily as needed (feet). Thin amount to face for rash/itching) 30 g 0  . VENTOLIN HFA 108 (90 Base) MCG/ACT inhaler INHALE ONE TO TWO puffs into THE lungs EVERY SIX HOURS AS NEEDED SHORTNESS OF BREATH (Patient taking differently: Inhale 1-2 puffs into the lungs every 6 (six) hours as needed for shortness of breath. ) 18 g 12   Current Facility-Administered Medications  Medication Dose Route Frequency Provider Last Rate Last Admin  . sodium chloride 0.9 % bolus 1,000 mL  1,000 mL Intravenous Once Arnetha Courser, MD      . triamcinolone acetonide (KENALOG-40) injection 40 mg  40 mg Intra-articular Once Claudean Severance, MD        Review of Systems Review of Systems  Blood pressure 136/88, pulse 88, weight 188 lb (85.3 kg).  Physical Exam Physical Exam  Data Reviewed Pap result  Assessment    Procedure Details  The risks and benefits of the procedure and Written informed consent obtained.  Speculum placed in vagina and excellent visualization of cervix achieved, cervix swabbed x 3 with acetic acid solution. Patient given informed consent, signed copy in the chart, time out was performed.  Placed in lithotomy position. Cervix viewed with speculum and colposcope after application of acetic acid.   Colposcopy adequate?  yes Acetowhite lesions? no Punctation? no Mosaicism?  no Abnormal vasculature?  no Biopsies? yes ECC? yes  COMMENTS:  Patient was given post procedure instructions.  Scheryl Darter, MD  Specimens: ECC, Bx 1200  Complications: none.     Plan    Specimens labelled and sent to Pathology. Discuss Pathology results in 2 weeks.      Scheryl Darter 03/18/2020, 3:49 PM

## 2020-03-19 LAB — CERVICOVAGINAL ANCILLARY ONLY
Chlamydia: NEGATIVE
Comment: NEGATIVE
Comment: NORMAL
Neisseria Gonorrhea: NEGATIVE

## 2020-03-22 ENCOUNTER — Other Ambulatory Visit: Payer: Self-pay

## 2020-03-22 ENCOUNTER — Ambulatory Visit: Payer: Medicare HMO | Attending: Internal Medicine | Admitting: Physical Therapy

## 2020-03-22 ENCOUNTER — Encounter: Payer: Self-pay | Admitting: Physical Therapy

## 2020-03-22 DIAGNOSIS — R2689 Other abnormalities of gait and mobility: Secondary | ICD-10-CM | POA: Diagnosis not present

## 2020-03-22 DIAGNOSIS — G8929 Other chronic pain: Secondary | ICD-10-CM | POA: Diagnosis not present

## 2020-03-22 DIAGNOSIS — M25561 Pain in right knee: Secondary | ICD-10-CM | POA: Insufficient documentation

## 2020-03-22 DIAGNOSIS — R600 Localized edema: Secondary | ICD-10-CM | POA: Diagnosis not present

## 2020-03-22 DIAGNOSIS — M6281 Muscle weakness (generalized): Secondary | ICD-10-CM | POA: Insufficient documentation

## 2020-03-22 LAB — SURGICAL PATHOLOGY
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
## 2020-03-23 ENCOUNTER — Encounter: Payer: Medicare HMO | Admitting: Internal Medicine

## 2020-03-23 ENCOUNTER — Telehealth: Payer: Self-pay

## 2020-03-23 DIAGNOSIS — I10 Essential (primary) hypertension: Secondary | ICD-10-CM | POA: Diagnosis not present

## 2020-03-23 DIAGNOSIS — R945 Abnormal results of liver function studies: Secondary | ICD-10-CM | POA: Diagnosis not present

## 2020-03-23 DIAGNOSIS — D689 Coagulation defect, unspecified: Secondary | ICD-10-CM | POA: Diagnosis not present

## 2020-03-23 DIAGNOSIS — E119 Type 2 diabetes mellitus without complications: Secondary | ICD-10-CM | POA: Diagnosis not present

## 2020-03-23 DIAGNOSIS — K754 Autoimmune hepatitis: Secondary | ICD-10-CM | POA: Diagnosis not present

## 2020-03-23 DIAGNOSIS — Z79899 Other long term (current) drug therapy: Secondary | ICD-10-CM | POA: Diagnosis not present

## 2020-03-23 NOTE — Telephone Encounter (Signed)
Spoke with patient and discussed results. Pt would like rx to help with menopause symptoms. Advised I would route to provider for review.

## 2020-03-25 ENCOUNTER — Ambulatory Visit (INDEPENDENT_AMBULATORY_CARE_PROVIDER_SITE_OTHER): Payer: Medicare HMO | Admitting: Internal Medicine

## 2020-03-25 VITALS — BP 114/69 | HR 95 | Temp 98.4°F | Wt 187.2 lb

## 2020-03-25 DIAGNOSIS — M25562 Pain in left knee: Secondary | ICD-10-CM | POA: Diagnosis not present

## 2020-03-25 DIAGNOSIS — M25511 Pain in right shoulder: Secondary | ICD-10-CM

## 2020-03-25 DIAGNOSIS — M25561 Pain in right knee: Secondary | ICD-10-CM

## 2020-03-25 DIAGNOSIS — G8929 Other chronic pain: Secondary | ICD-10-CM

## 2020-03-25 DIAGNOSIS — F332 Major depressive disorder, recurrent severe without psychotic features: Secondary | ICD-10-CM

## 2020-03-25 DIAGNOSIS — M2550 Pain in unspecified joint: Secondary | ICD-10-CM

## 2020-03-25 DIAGNOSIS — K7460 Unspecified cirrhosis of liver: Secondary | ICD-10-CM | POA: Diagnosis not present

## 2020-03-25 DIAGNOSIS — F319 Bipolar disorder, unspecified: Secondary | ICD-10-CM | POA: Diagnosis not present

## 2020-03-25 NOTE — Patient Instructions (Addendum)
It was nice seeing you today! Thank you for choosing [facility] for your Primary Care.    Today we talked about:   1. Knee Pain: Please contact your Orthopedic Surgeon's office for additional options, including different injections.   2. Depression: Our counselor, Ms Lysle Rubens, will give you a call. Let's hold off on medication and follow up in 2 weeks via telephone

## 2020-03-26 ENCOUNTER — Telehealth: Payer: Self-pay

## 2020-03-26 NOTE — Telephone Encounter (Signed)
Contacted patient to discuss results. Discussed inflammatory markers are elevated but still waiting on anti-CCP for additional information. Patient expressed understanding.

## 2020-03-26 NOTE — Progress Notes (Signed)
   CC: Joint pain  HPI:  Hannah Young is a 54 y.o. with a PMHx as listed below who presents to the clinic for joint pain.   Please see the Encounters tab for problem-based Assessment & Plan regarding status of patient's acute and chronic conditions.  Past Medical History:  Diagnosis Date  . Anxiety   . Arthritis   . Asthma   . Bipolar 1 disorder (HCC)   . Breast lump   . Cirrhosis (HCC)   . Depression   . Diabetes mellitus   . Fall 09/02/2019  . FH: chemotherapy   . Gout   . Hepatitis B infection   . High cholesterol   . Hyperglycemia   . Hypertension   . Liver cirrhosis (HCC)   . Lung nodules   . Neuropathy   . Panic disorder   . Vaginal discharge 01/25/2017  . Vertigo    Review of Systems: Review of Systems  Constitutional: Negative for chills and fever.  Gastrointestinal: Negative for abdominal pain, diarrhea, nausea and vomiting.  Musculoskeletal: Positive for joint pain and myalgias.  Neurological: Negative for dizziness, focal weakness, weakness and headaches.  Psychiatric/Behavioral: Positive for depression. Negative for suicidal ideas.   Physical Exam:  Vitals:   03/25/20 0917  BP: 114/69  Pulse: 95  Temp: 98.4 F (36.9 C)  TempSrc: Oral  SpO2: 100%  Weight: 187 lb 3.2 oz (84.9 kg)   Physical Exam Constitutional:      General: She is not in acute distress.    Appearance: She is normal weight.  Cardiovascular:     Rate and Rhythm: Normal rate and regular rhythm.  Pulmonary:     Effort: Pulmonary effort is normal. No respiratory distress.     Breath sounds: No wheezing or rales.  Abdominal:     General: There is no distension.     Palpations: Abdomen is soft. There is no hepatomegaly.  Musculoskeletal:     Right lower leg: No edema.     Left lower leg: No edema.     Comments: Right shoulder: No gross abnormalities on inspection. Tenderness to palpation along anterior joint line and overlying the AC joint. Full ROM intact although with pain.  Negative empty can test.   Right knee pain: No gross abnormalities on inspection. No effusion. Tenderness along inferior joint line. Full ROM intact. No laxity noted.   Skin:    General: Skin is warm and dry.     Coloration: Skin is not jaundiced.  Neurological:     General: No focal deficit present.     Mental Status: She is alert and oriented to person, place, and time. Mental status is at baseline.  Psychiatric:        Attention and Perception: Attention normal.        Mood and Affect: Mood is depressed. Affect is tearful.        Speech: Speech normal.        Behavior: Behavior normal. Behavior is cooperative.    Assessment & Plan:   See Encounters Tab for problem based charting.  Patient discussed with Dr. Sandre Kitty

## 2020-03-26 NOTE — Telephone Encounter (Signed)
Requesting to speak with a nurse about test results, please call pt back.  ?

## 2020-03-27 LAB — RHEUMATOID FACTOR: Rheumatoid fact SerPl-aCnc: 12.3 IU/mL (ref 0.0–13.9)

## 2020-03-27 LAB — CYCLIC CITRUL PEPTIDE ANTIBODY, IGG/IGA: Cyclic Citrullin Peptide Ab: 15 units (ref 0–19)

## 2020-03-27 LAB — SEDIMENTATION RATE: Sed Rate: 82 mm/hr — ABNORMAL HIGH (ref 0–40)

## 2020-03-27 LAB — C-REACTIVE PROTEIN: CRP: 12 mg/L — ABNORMAL HIGH (ref 0–10)

## 2020-03-29 DIAGNOSIS — M2550 Pain in unspecified joint: Secondary | ICD-10-CM | POA: Insufficient documentation

## 2020-03-29 NOTE — Assessment & Plan Note (Addendum)
Hannah Young states she has chronic right shoulder and bilateral knee pain that has been ongoing for months now. She denies any acute worsening but is feeling frustrated at the lack of improvement. She endorses pain is exacerbated by movement. She has started PT but has not felt an improvement yet. She denies any erythema or swelling of the associated joints. She denies any fever, chills, myalgias, fatigue.   Assessment/Plan:  Right shoulder pain previously attributed to tendonitis. Bilateral knee pain secondary to osteoarthritis. While certainly may be separate etiologies, given patient's history of autoimmune hepatitis, evaluation for rheumatological causes of joint pain initiated. Two previous ANA have been negative. At this visit, anti-CCP and RF were both negative. However, ESR and CRP are both elevated. This may be secondary to her chronic liver disease though and unrelated to her joint pain. She has recently followed up with [facility]; will need to obtain those records.   - Continue to follow up joint pain clinically at future visits - Recommended she contact [facility] for follow up office visit; provided telephone number  - Consider repeating inflammatory factors and/or referral to rheumatology if patient fails conservative management with PT
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
## 2020-03-30 ENCOUNTER — Telehealth: Payer: Self-pay

## 2020-03-30 NOTE — Telephone Encounter (Signed)
Requesting meds for menopause, please call pt back.

## 2020-03-30 NOTE — Telephone Encounter (Signed)
Returned call, pt still requesting rx to help with menopausal symptoms, vaginal dryness/irritation. Routed to provider.

## 2020-03-30 NOTE — Telephone Encounter (Signed)
I attempted to contact patient this afternoon (no answer, unable to leave VM) to update that no evidence of rheumatoid arthritis on lab work but did show elevated inflammatory markers. I recommended she discuss these results with her GI as may be from liver disease.   I have not received any records from [facility]

## 2020-03-30 NOTE — Telephone Encounter (Signed)
Returned call to patient. C/o hot flashes at night and the inability to rest 2/2 this and shoulder pain. Explained this would best be directed to the GYN she was referred to. Gave contact info:  [facility] [address] [place],  [place]  [address]  Main: [phone]  Also wanted to know if Dr. Huel Cote received any other lab results from [facility].

## 2020-03-31 NOTE — Progress Notes (Signed)
Internal Medicine Clinic Attending  Case discussed with Dr. Basaraba at the time of the visit.  We reviewed the resident's history and exam and pertinent patient test results.  I agree with the assessment, diagnosis, and plan of care documented in the resident's note.  Raizy Auzenne, M.D., Ph.D.  

## 2020-03-31 NOTE — Telephone Encounter (Signed)
Pt called back, gave her dr basaraba's message, she states she has GI appt tomorrow

## 2020-03-31 NOTE — Telephone Encounter (Signed)
Thank you Helen 

## 2020-04-01 DIAGNOSIS — K754 Autoimmune hepatitis: Secondary | ICD-10-CM | POA: Diagnosis not present

## 2020-04-01 DIAGNOSIS — R1013 Epigastric pain: Secondary | ICD-10-CM | POA: Diagnosis not present

## 2020-04-01 DIAGNOSIS — R7989 Other specified abnormal findings of blood chemistry: Secondary | ICD-10-CM | POA: Diagnosis not present

## 2020-04-06 ENCOUNTER — Ambulatory Visit: Payer: Medicare HMO | Admitting: Physical Therapy

## 2020-04-08 ENCOUNTER — Ambulatory Visit: Payer: Medicare HMO | Admitting: Physical Therapy

## 2020-04-12 ENCOUNTER — Encounter: Payer: Medicare HMO | Admitting: Physical Therapy

## 2020-04-13 ENCOUNTER — Telehealth: Payer: Self-pay | Admitting: Internal Medicine

## 2020-04-13 ENCOUNTER — Other Ambulatory Visit: Payer: Self-pay | Admitting: Internal Medicine

## 2020-04-13 ENCOUNTER — Telehealth: Payer: Self-pay | Admitting: Physical Therapy

## 2020-04-13 MED ORDER — ACCU-CHEK GUIDE ME W/DEVICE KIT: PACK | 0 refills | Status: DC

## 2020-04-13 MED ORDER — ACCU-CHEK GUIDE VI STRP: ORAL_STRIP | 12 refills | Status: DC

## 2020-04-13 NOTE — Telephone Encounter (Signed)
Pt requesting a call back  Pt has lost her Diabetic Meter and is requesting a new one be sent to  [facility]  Address: [address] [place], [place], [place] [address] Phone: [phone]

## 2020-04-13 NOTE — Telephone Encounter (Signed)
Hannah Young, can you assist patient with this, please. Thank you!

## 2020-04-13 NOTE — Telephone Encounter (Signed)
Accu-chek guide me with strips sent to pharmacy requested

## 2020-04-13 NOTE — Telephone Encounter (Signed)
Spoke with pt checking up to see how she was doing due to multiple cancelled appointment and had not been seen since her evaluation over 4 weeks ago. She stated she had been helping her grandson with school and had not had any time to come in. I discussed if she is too busy that perhaps PT isn't appropriate at this time which she agreed and opted to be dishcarged. I noted if she were to come back she would need an updated referral which she verbalized understanding.   Kendra Grissett PT, DPT, LAT, ATC  04/13/20  3:05 PM

## 2020-04-14 ENCOUNTER — Encounter: Payer: Self-pay | Admitting: Licensed Clinical Social Worker

## 2020-04-14 ENCOUNTER — Ambulatory Visit: Payer: Medicare HMO | Admitting: Physical Therapy

## 2020-04-20 ENCOUNTER — Encounter: Payer: Medicare HMO | Admitting: Physical Therapy

## 2020-04-22 ENCOUNTER — Encounter: Payer: Medicare HMO | Admitting: Physical Therapy

## 2020-04-26 ENCOUNTER — Encounter: Payer: Medicare HMO | Admitting: Physical Therapy

## 2020-04-28 ENCOUNTER — Encounter: Payer: Medicare HMO | Admitting: Physical Therapy

## 2020-04-30 ENCOUNTER — Encounter: Payer: Self-pay | Admitting: Neurology

## 2020-04-30 ENCOUNTER — Ambulatory Visit (INDEPENDENT_AMBULATORY_CARE_PROVIDER_SITE_OTHER): Payer: Medicare HMO | Admitting: Neurology

## 2020-04-30 VITALS — BP 131/86 | HR 80 | Ht 61.0 in | Wt 190.0 lb

## 2020-04-30 DIAGNOSIS — M5481 Occipital neuralgia: Secondary | ICD-10-CM

## 2020-04-30 DIAGNOSIS — G8929 Other chronic pain: Secondary | ICD-10-CM

## 2020-04-30 DIAGNOSIS — M4722 Other spondylosis with radiculopathy, cervical region: Secondary | ICD-10-CM | POA: Diagnosis not present

## 2020-04-30 DIAGNOSIS — M25511 Pain in right shoulder: Secondary | ICD-10-CM | POA: Diagnosis not present

## 2020-04-30 DIAGNOSIS — M79601 Pain in right arm: Secondary | ICD-10-CM

## 2020-04-30 DIAGNOSIS — M542 Cervicalgia: Secondary | ICD-10-CM
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
## 2020-04-30 NOTE — Patient Instructions (Signed)
MRI of the neck     Occipital Neuralgia  Occipital neuralgia is a type of headache that causes brief episodes of very bad pain in the back of your head. Pain from occipital neuralgia may spread (radiate) to other parts of your head. These headaches may be caused by irritation of the nerves that leave your spinal cord high up in your neck, just below the base of your skull (occipital nerves). Your occipital nerves transmit sensations from the back of your head, the top of your head, and the areas behind your ears. What are the causes? This condition can occur without any known cause (primary headache syndrome). In other cases, this condition is caused by pressure on or irritation of one of the two occipital nerves. Pressure and irritation may be due to:  Muscle spasm in the neck.  Neck injury.  Wear and tear of the vertebrae in the neck (osteoarthritis).  Disease of the disks that separate the vertebrae.  Swollen blood vessels that put pressure on the occipital nerves.  Infections.  Tumors.  Diabetes. What are the signs or symptoms? This condition causes brief burning, stabbing, electric, shocking, or shooting pain which can radiate to the top of the head. It can happen on one side or both sides of the head. It can also cause:  Pain behind the eye.  Pain triggered by neck movement or hair brushing.  Scalp tenderness.  Aching in the back of the head between episodes of very bad pain.  Pain gets worse with exposure to bright lights. How is this diagnosed? There is no test that diagnoses this condition. Your health care provider may diagnose this condition based on a physical exam and your symptoms. Other tests may be done, such as:  Imaging studies of the brain and neck (cervical spine), such as an MRI or CT scan. These look for causes of pinched nerves.  Applying pressure to the nerves in the neck to try to re-create the pain.  Injection of numbing medicine into the  occipital nerve areas to see if pain goes away (diagnostic nerve block). How is this treated? Treatment for this condition may begin with simple measures, such as:  Rest.  Massage.  Applying heat or cold on the area.  Over-the-counter pain relievers. If these measures do not work, you may need other treatments, including:  Medicines, such as: ? Prescription-strength anti-inflammatory medicines. ? Muscle relaxants. ? Anti-seizure medicines, which can relieve pain. ? Antidepressants, which can relieve pain. ? Injected medicines, such as medicines that numb the area (local anesthetic) and steroids.  Pulsed radiofrequency ablation. This is when wires are implanted to deliver electrical impulses that block pain signals from the occipital nerve.  Surgery to relieve nerve pressure.  Physical therapy. Follow these instructions at home: Pain management      Avoid any activities that cause pain.  Rest when you have an attack of pain.  Try gentle massage to relieve pain.  Try a different pillow or sleeping position.  If directed, apply heat to the affected area as told by your health care provider. Use the heat source that your health care provider recommends, such as a moist heat pack or a heating pad. ? Place a towel between your skin and the heat source. ? Leave the heat on for 20-30 minutes. ? Remove the heat if your skin turns bright red. This is especially important if you are unable to feel pain, heat, or cold. You may have a greater risk of getting  burned.  If directed, apply ice to the back of the head and neck area as told by your health care provider. ? Put ice in a plastic bag. ? Place a towel between your skin and the bag. ? Leave the ice on for 20 minutes, 2-3 times per day. General instructions  Take over-the-counter and prescription medicines only as told by your health care provider.  Avoid things that make your symptoms worse, such as bright lights.  Try  to stay active. Get regular exercise that does not cause pain. Ask your health care provider to suggest safe exercises for you.  Work with a physical therapist to learn stretching exercises you can do at home.  Practice good posture.  Keep all follow-up visits as told by your health care provider. This is important. Contact a health care provider if:  Your medicine is not working.  You have new or worsening symptoms. Get help right away if:  You have very bad head pain that does not go away.  You have a sudden change in vision, balance, or speech. Summary  Occipital neuralgia is a type of headache that causes brief episodes of very bad pain in the back of your head.  Pain from occipital neuralgia may spread (radiate) to other parts of your head.  Treatment for this condition includes rest, massage, and medicines. This information is not intended to replace advice given to you by your health care provider. Make sure you discuss any questions you have with your health care provider. Document Revised: 07/17/2017 Document Reviewed: 10/05/2016 Elsevier Patient Education  2020 [employer].

## 2020-05-01 ENCOUNTER — Encounter: Payer: Self-pay | Admitting: Neurology

## 2020-05-01 DIAGNOSIS — M5481 Occipital neuralgia: Secondary | ICD-10-CM | POA: Insufficient documentation

## 2020-05-01 DIAGNOSIS — G8929 Other chronic pain: Secondary | ICD-10-CM | POA: Insufficient documentation

## 2020-05-03 ENCOUNTER — Telehealth: Payer: Self-pay | Admitting: Neurology

## 2020-05-03 ENCOUNTER — Telehealth: Payer: Self-pay | Admitting: Internal Medicine

## 2020-05-03 NOTE — Telephone Encounter (Signed)
Francine Graven Berkley Harvey: [id number] (exp. 05/03/20 to 06/02/20)/medicaid order sent to GI. They will reach out to the patient to schedule.

## 2020-05-03 NOTE — Telephone Encounter (Signed)
Return pt's call - requesting something for right shoulder pain; which has been ongoing x 2 months. Unable to sleep. She has been taking Ibuprofen which hurts her stomach. Agreeable to schedule telehealth appt - call transferred to front office. Appt scheduled 9/22 with Dr Cleaster Corin.

## 2020-05-03 NOTE — Telephone Encounter (Signed)
Thank you for letting us know.  

## 2020-05-03 NOTE — Telephone Encounter (Signed)
Please call patient back about pain medication.  Pt also unsure of what Insulin medication she is taking.

## 2020-05-05 ENCOUNTER — Other Ambulatory Visit: Payer: Self-pay

## 2020-05-05 ENCOUNTER — Ambulatory Visit (INDEPENDENT_AMBULATORY_CARE_PROVIDER_SITE_OTHER): Payer: Medicare HMO | Admitting: Internal Medicine

## 2020-05-05 DIAGNOSIS — M7521 Bicipital tendinitis, right shoulder: Secondary | ICD-10-CM | POA: Diagnosis not present

## 2020-05-05 NOTE — Assessment & Plan Note (Addendum)
Unable to evaluate due to telehealth visit, but with increasing pain and endorsed weakness of flexion, will refer back to ortho and order MRI.

## 2020-05-05 NOTE — Progress Notes (Signed)
   CC: right shoulder pain  This is a telephone encounter between [name] and Versie Starks on 05/05/2020 for right shoulder pain. The visit was conducted with the patient located at home and Versie Starks at [facility]. The patient's identity was confirmed using their DOB and current address. The patient has consented to being evaluated through a telephone encounter and understands the associated risks (an examination cannot be done and the patient may need to come in for an appointment) / benefits (allows the patient to remain at home, decreasing exposure to coronavirus). I personally spent 16 minutes on medical discussion.   HPI:   [name] is a 54 y.o. with PMH as below presenting via telehealth visit with anterior shoulder pain. She states she was called about doing physical therapy and is upset because she is in so much pain with movement of her arm that she does not think physical therapy is feasible. She is having trouble sleeping at night due to the pain. The pain is worse with forward flexion of her arm and in the front of her shoulder. She endorses weakness as well.    Please see A&P for assessment of the patient's acute and chronic medical conditions.    Past Medical History:  Diagnosis Date  . Anxiety   . Arthritis   . Asthma   . Bipolar 1 disorder (HCC)   . Breast lump   . Cirrhosis (HCC)   . Depression   . Diabetes mellitus   . Fall 09/02/2019  . FH: chemotherapy   . Gout   . Hepatitis B infection   . High cholesterol   . Hyperglycemia   . Hypertension   . Liver cirrhosis (HCC)   . Lung nodules   . Neuropathy   . Panic disorder   . Vaginal discharge 01/25/2017  . Vertigo    Review of Systems:    Review of Systems  Musculoskeletal: Positive for myalgias. Negative for back pain and neck pain.       Pain in anterior right shoulder with weakness in flexion       Assessment & Plan:   See Encounters Tab for problem based charting.  Patient  discussed with Dr. Heide Spark

## 2020-05-07 ENCOUNTER — Ambulatory Visit: Payer: Medicare HMO | Admitting: Physical Therapy

## 2020-05-08 ENCOUNTER — Ambulatory Visit
Admission: RE | Admit: 2020-05-08 | Discharge: 2020-05-08 | Disposition: A | Discharge status: Home / Self Care | Payer: Medicare HMO | Source: Ambulatory Visit | Attending: Neurology | Admitting: Neurology

## 2020-05-08 DIAGNOSIS — M5481 Occipital neuralgia: Secondary | ICD-10-CM

## 2020-05-08 DIAGNOSIS — M79601 Pain in right arm: Secondary | ICD-10-CM

## 2020-05-08 DIAGNOSIS — M4722 Other spondylosis with radiculopathy, cervical region: Secondary | ICD-10-CM

## 2020-05-08 NOTE — Progress Notes (Signed)
[place]: Patient does have arthritis in the neck which can be contributing to neck pain and headaches but no nerve root compression or cervical stenosis. We have referred her to Dr. Alvester Morin for evaluation of her occipital neuralgia for possible procedures on the occipital nerve  or other injections into the neck. She did extremely well (pain went from 7/10 to 0/10) with the occipital nerve blocks. I am going to cc Dr. Alvester Morin here just to make sure he is ok with the referral.  thanks

## 2020-05-10 ENCOUNTER — Ambulatory Visit: Payer: Medicare HMO | Admitting: Physical Therapy

## 2020-05-11 ENCOUNTER — Telehealth: Payer: Self-pay | Admitting: Neurology

## 2020-05-11 NOTE — Telephone Encounter (Signed)
I spoke with the pt and discussed the MRI results and recommendations from Dr Lucia Gaskins. She verbalized understanding and is agreeable to the plan. She is aware Dr Amasa Blas office had tried to call her 9/21. I provided their number for her to call them back. Pt had declined PT at this time. She will consider trying it now that she has her results. She was provided with the number to call and schedule. Pt verbalized appreciation for the call.

## 2020-05-11 NOTE — Telephone Encounter (Signed)
Anson Fret, MD  05/08/2020  2:48 PM EDT Back to Top    Patient does have arthritis in the neck which can be contributing to neck pain and headaches but no nerve root compression or cervical stenosis. We have referred her to Dr. Alvester Morin for evaluation of her occipital neuralgia for possible procedures on the occipital nerve  or other injections into the neck. She did extremely well (pain went from 7/10 to 0/10) with the occipital nerve blocks. I am going to cc Dr. Alvester Morin here just to make sure he is ok with the referral.  thanks

## 2020-05-11 NOTE — Telephone Encounter (Signed)
Pt called wanting to know if her MRI results have come in. Please advise. 

## 2020-05-14 ENCOUNTER — Telehealth: Payer: Self-pay

## 2020-05-14 ENCOUNTER — Other Ambulatory Visit: Payer: Self-pay

## 2020-05-14 ENCOUNTER — Ambulatory Visit (HOSPITAL_COMMUNITY)
Admission: EM | Admit: 2020-05-14 | Discharge: 2020-05-14 | Disposition: A | Discharge status: Home / Self Care | Payer: Medicare HMO | Attending: Family Medicine | Admitting: Family Medicine

## 2020-05-14 ENCOUNTER — Encounter (HOSPITAL_COMMUNITY): Payer: Self-pay

## 2020-05-14 DIAGNOSIS — N898 Other specified noninflammatory disorders of vagina: Secondary | ICD-10-CM | POA: Diagnosis not present

## 2020-05-14 DIAGNOSIS — Z113 Encounter for screening for infections with a predominantly sexual mode of transmission: Secondary | ICD-10-CM | POA: Diagnosis not present

## 2020-05-14 DIAGNOSIS — N760 Acute vaginitis: Secondary | ICD-10-CM | POA: Diagnosis not present

## 2020-05-14 LAB — POCT URINALYSIS DIPSTICK, ED / UC
Bilirubin Urine: NEGATIVE
Glucose, UA: NEGATIVE mg/dL
Hgb urine dipstick: NEGATIVE
Ketones, ur: NEGATIVE mg/dL
Leukocytes,Ua: NEGATIVE
Nitrite: NEGATIVE
Protein, ur: NEGATIVE mg/dL
Specific Gravity, Urine: 1.01 (ref 1.005–1.030)
Urobilinogen, UA: 0.2 mg/dL (ref 0.0–1.0)
pH: 6.5 (ref 5.0–8.0)

## 2020-05-14 LAB — HIV ANTIBODY (ROUTINE TESTING W REFLEX): HIV Screen 4th Generation wRfx: NONREACTIVE

## 2020-05-14 MED ORDER — CEFTRIAXONE SODIUM 500 MG IJ SOLR
500.0000 mg | Freq: Once | INTRAMUSCULAR | Status: AC
  Administered 2020-05-14: 500 mg via INTRAMUSCULAR

## 2020-05-14 MED ORDER — DOXYCYCLINE HYCLATE 100 MG PO CAPS: 100.0000 mg | ORAL_CAPSULE | Freq: Two times a day (BID) | ORAL | 0 refills | Status: DC

## 2020-05-14 MED ORDER — LIDOCAINE HCL (PF) 1 % IJ SOLN
INTRAMUSCULAR | Status: AC
  Filled 2020-05-14: qty 2

## 2020-05-14 MED ORDER — CEFTRIAXONE SODIUM 500 MG IJ SOLR
INTRAMUSCULAR | Status: AC
  Filled 2020-05-14: qty 500

## 2020-05-14 NOTE — ED Triage Notes (Signed)
Pt presents with vaginal irritation and discharge the past few days.

## 2020-05-14 NOTE — Discharge Instructions (Addendum)

## 2020-05-14 NOTE — Telephone Encounter (Signed)
Pt would like test results from urgent care.

## 2020-05-14 NOTE — ED Provider Notes (Signed)
[facility]   [id number] 05/14/20 Arrival Time: 4098  ASSESSMENT & PLAN:  1. Vaginal discharge   2. Screening for STDs (sexually transmitted diseases)       Discharge Instructions     You have been given the following today for treatment of suspected gonorrhea and/or chlamydia:  cefTRIAXone (ROCEPHIN) injection 500 mg  Please pick up your prescription for doxycycline 100 mg and begin taking twice daily for the next seven (7) days.  Even though we have treated you today, we have sent testing for sexually transmitted infections. We will notify you of any positive results once they are received. If required, we will prescribe any medications you might need.  Please refrain from all sexual activity for at least the next seven days.     Without s/s of PID.  Labs Reviewed  RPR  HIV ANTIBODY (ROUTINE TESTING W REFLEX)  POCT URINALYSIS DIPSTICK, ED / UC  CERVICOVAGINAL ANCILLARY ONLY    Pending: Unresulted Labs (From admission, onward)          Start     Ordered   05/14/20 1157  RPR  (STI Exposure)  Once,   STAT        05/14/20 1158   05/14/20 1157  HIV antibody  (STI Exposure)  Once,   STAT        05/14/20 1158           Will notify of any positive results. Instructed to refrain from sexual activity for at least seven days.  Reviewed expectations re: course of current medical issues. Questions answered. Outlined signs and symptoms indicating need for more acute intervention. Patient verbalized understanding. After Visit Summary given.   SUBJECTIVE:  Hannah Young is a 54 y.o. female who presents with complaint of vaginal discharge. Onset gradual. First noticed few d ago. Describes discharge as thick and opaque; without odor. No specific aggravating or alleviating factors reported. Denies: urinary frequency, dysuria and gross hematuria. Afebrile. No abdominal or pelvic pain. Normal PO intake wihout n/v. No genital rashes or lesions. Reports that she  is sexually active with single female partner.  No LMP recorded. (Menstrual status: Perimenopausal).   OBJECTIVE:  Vitals:   05/14/20 1110  BP: (!) 124/96  Pulse: 84  Resp: 17  Temp: 98.1 F (36.7 C)  TempSrc: Oral  SpO2: 96%     General appearance: alert, cooperative, appears stated age and no distress Lungs: unlabored respirations; speaks full sentences without difficulty Back: no CVA tenderness; FROM at waist Abdomen: soft, non-tender GU: deferred Skin: warm and dry Psychological: alert and cooperative; normal mood and affect.  Results for orders placed or performed during the hospital encounter of 05/14/20  POC Urinalysis dipstick  Result Value Ref Range   Glucose, UA NEGATIVE NEGATIVE mg/dL   Bilirubin Urine NEGATIVE NEGATIVE   Ketones, ur NEGATIVE NEGATIVE mg/dL   Specific Gravity, Urine 1.010 1.005 - 1.030   Hgb urine dipstick NEGATIVE NEGATIVE   pH 6.5 5.0 - 8.0   Protein, ur NEGATIVE NEGATIVE mg/dL   Urobilinogen, UA 0.2 0.0 - 1.0 mg/dL   Nitrite NEGATIVE NEGATIVE   Leukocytes,Ua NEGATIVE NEGATIVE    Labs Reviewed  RPR  HIV ANTIBODY (ROUTINE TESTING W REFLEX)  POCT URINALYSIS DIPSTICK, ED / UC  CERVICOVAGINAL ANCILLARY ONLY    Allergies  Allergen Reactions  . Augmentin [Amoxicillin-Pot Clavulanate] Anaphylaxis    Has patient had a PCN reaction causing immediate rash, facial/tongue/throat swelling, SOB or lightheadedness with hypotension: Yes Has patient had  a PCN reaction causing severe rash involving mucus membranes or skin necrosis: No Has patient had a PCN reaction that required hospitalization: No Has patient had a PCN reaction occurring within the last 10 years: Yes If all of the above answers are "NO", then may proceed with Cephalosporin use.   . Tylenol [Acetaminophen] Other (See Comments)    Patient has liver disease, MD has stated that SHE CANNOT TAKE ANY TYLENOL EVER.   . Latex Rash    Past Medical History:  Diagnosis Date  . Anxiety     . Arthritis   . Asthma   . Bipolar 1 disorder (HCC)   . Breast lump   . Cirrhosis (HCC)   . Depression   . Diabetes mellitus   . Fall 09/02/2019  . FH: chemotherapy   . Gout   . Hepatitis B infection   . High cholesterol   . Hyperglycemia   . Hypertension   . Liver cirrhosis (HCC)   . Lung nodules   . Neuropathy   . Panic disorder   . Vaginal discharge 01/25/2017  . Vertigo    Family History  Problem Relation Age of Onset  . Liver disease Mother   . Kidney failure Father   . Colon cancer Neg Hx   . Stomach cancer Neg Hx   . Migraines Neg Hx   . Headache Neg Hx    Social History   Socioeconomic History  . Marital status: Widowed    Spouse name: Not on file  . Number of children: 4  . Years of education: Not on file  . Highest education level: Not on file  Occupational History  . Not on file  Tobacco Use  . Smoking status: Current Every Day Smoker    Packs/day: 0.30    Types: Cigarettes    Last attempt to quit: 12/27/2017    Years since quitting: 2.3  . Smokeless tobacco: Never Used  . Tobacco comment: 2-3 cigs/day  Vaping Use  . Vaping Use: Never used  Substance and Sexual Activity  . Alcohol use: Not Currently    Alcohol/week: 3.0 standard drinks    Types: 3 Cans of beer per week  . Drug use: No  . Sexual activity: Yes    Birth control/protection: Surgical  Other Topics Concern  . Not on file  Social History Narrative   Lives at home. Two of her grandchildren live with her.    Right handed   Caffeine: none    Social Determinants of Health   Financial Resource Strain:   . Difficulty of Paying Living Expenses: Not on file  Food Insecurity:   . Worried About Programme researcher, broadcasting/film/video in the Last Year: Not on file  . Ran Out of Food in the Last Year: Not on file  Transportation Needs:   . Lack of Transportation (Medical): Not on file  . Lack of Transportation (Non-Medical): Not on file  Physical Activity:   . Days of Exercise per Week: Not on file  .  Minutes of Exercise per Session: Not on file  Stress:   . Feeling of Stress : Not on file  Social Connections:   . Frequency of Communication with Friends and Family: Not on file  . Frequency of Social Gatherings with Friends and Family: Not on file  . Attends Religious Services: Not on file  . Active Member of Clubs or Organizations: Not on file  . Attends Banker Meetings: Not on file  . Marital Status:  Not on file  Intimate Partner Violence:   . Fear of Current or Ex-Partner: Not on file  . Emotionally Abused: Not on file  . Physically Abused: Not on file  . Sexually Abused: Not on file          Mardella Layman, MD 05/14/20 1201

## 2020-05-15 LAB — RPR: RPR Ser Ql: NONREACTIVE

## 2020-05-17 LAB — CERVICOVAGINAL ANCILLARY ONLY
Bacterial Vaginitis (gardnerella): NEGATIVE
Candida Glabrata: NEGATIVE
Candida Vaginitis: NEGATIVE
Chlamydia: NEGATIVE
Comment: NEGATIVE
Comment: NEGATIVE
Comment: NEGATIVE
Comment: NEGATIVE
Comment: NEGATIVE
Comment: NORMAL
Neisseria Gonorrhea: NEGATIVE
Trichomonas: NEGATIVE

## 2020-05-17 NOTE — Telephone Encounter (Signed)
Called patient back with HIV, RPR and Urine dipstick results. Patient reports that she has some vaginal irritation for which she wanted to get tested. Recent sexual activity with female partner who she suspects of sleeping with other women. Advised patient to use barrier protection to prevent STDs. Also advised that if vaginal irritation persistent, can be further evaluated in clinic. She expresses understanding.

## 2020-05-19 NOTE — Progress Notes (Signed)
Internal Medicine Clinic Attending  Case discussed with Dr. Seawell  At the time of the visit.  We reviewed the resident's history and exam and pertinent patient test results.  I agree with the assessment, diagnosis, and plan of care documented in the resident's note.  

## 2020-05-21 ENCOUNTER — Telehealth: Payer: Self-pay | Admitting: *Deleted

## 2020-05-21 ENCOUNTER — Encounter: Payer: Self-pay | Admitting: *Deleted

## 2020-05-21 NOTE — Telephone Encounter (Signed)
Patient called in c/o nausea x 2 days. Denies vomiting. Thinks it might be r/t her acid reflux. Has not been eating "like I should and still taking my meds." Advised to drink plenty of fluids esp water. States she only has juice and tea at her house. Advised to dilute juice. She states she will ask her sister to bring her water. Also, Discussed BRAT diet. She states she cannot come to clinic but may go to ED and wanted Provider to be aware. Advised to go to UC instead of ED  if no improvement. Kinnie Feil, BSN, RN-BC

## 2020-05-21 NOTE — Progress Notes (Signed)

## 2020-05-21 NOTE — Telephone Encounter (Signed)
Thank you for talking with her. Agree with your recommendations.

## 2020-05-24 ENCOUNTER — Telehealth: Payer: Self-pay | Admitting: Internal Medicine

## 2020-05-24 NOTE — Telephone Encounter (Unsigned)
This encounter was created in error - please disregard.

## 2020-05-24 NOTE — Progress Notes (Signed)
Things That May Be Affecting Your Health:  Alcohol  Hearing loss  Pain    Depression  Home Safety Y Sexual Health  Y Diabetes  Lack of physical activity  Stress   Difficulty with daily activities  Loneliness  Tiredness   Drug use  Medicines  Tobacco use   Falls  Motor Vehicle Safety  Weight   Food choices  Oral Health  Other    YOUR PERSONALIZED HEALTH PLAN : 1. Schedule your next subsequent Medicare Wellness visit in one year 2. Attend all of your regular appointments to address your medical issues 3. Complete the preventative screenings and services   Annual Wellness Visit   Medicare Covered Preventative Screenings and Services  Services & Screenings Men and Women Who How Often Need? Date of Last Service Action  Abdominal Aortic Aneurysm Adults with AAA risk factors Once     Alcohol Misuse and Counseling All Adults Screening once a year if no alcohol misuse. Counseling up to 4 face to face sessions.     Bone Density Measurement  Adults at risk for osteoporosis Once every 2 yrs     Lipid Panel Z13.6 All adults without CV disease Once every 5 yrs YES 12/2009   Colorectal Cancer   Stool sample or  Colonoscopy All adults 50 and older   Once every year  Every 10 years YES Never   Depression All Adults Once a year YES Today   Diabetes Screening Blood glucose, post glucose load, or GTT Z13.1  All adults at risk  Pre-diabetics  Once per year  Twice per year     Diabetes  Self-Management Training All adults Diabetics 10 hrs first year; 2 hours subsequent years. Requires Copay     Glaucoma  Diabetics  Family history of glaucoma  African Americans 50 yrs +  Hispanic Americans 65 yrs + Annually - requires coppay YES    Hepatitis C Z72.89 or F19.20  High Risk for HCV  Born between 1945 and 1965  Annually  Once     HIV Z11.4 All adults based on risk  Annually btw ages 34 & 66 regardless of risk  Annually > 65 yrs if at increased risk     Lung Cancer Screening  Asymptomatic adults aged 32-77 with 30 pack yr history and current smoker OR quit within the last 15 yrs Annually Must have counseling and shared decision making documentation before first screen     Medical Nutrition Therapy Adults with   Diabetes  Renal disease  Kidney transplant within past 3 yrs 3 hours first year; 2 hours subsequent years YES    Obesity and Counseling All adults Screening once a year Counseling if BMI 30 or higher  Today   Tobacco Use Counseling Adults who use tobacco  Up to 8 visits in one year     Vaccines Z23  Hepatitis B  Influenza   Pneumonia  Adults   Once  Once every flu season  Two different vaccines separated by one year YES    Next Annual Wellness Visit People with Medicare Every year  Today     Services & Screenings Women Who How Often Need  Date of Last Service Action  Mammogram  Z12.31 Women over 40 One baseline ages 41-39. Annually ager 40 yrs+     Pap tests All women Annually if high risk. Every 2 yrs for normal risk women     Screening for cervical cancer with   Pap (Z01.419 nl or Z01.411abnl) &  HPV Z11.51 Women aged 33 to 73 Once every 5 yrs     Screening pelvic and breast exams All women Annually if high risk. Every 2 yrs for normal risk women     Sexually Transmitted Diseases  Chlamydia  Gonorrhea  Syphilis All at risk adults Annually for non pregnant females at increased risk YES        Services & Screenings Men Who How Ofter Need  Date of Last Service Action  Prostate Cancer - DRE & PSA Men over 50 Annually.  DRE might require a copay.     Sexually Transmitted Diseases  Syphilis All at risk adults Annually for men at increased risk

## 2020-06-01 ENCOUNTER — Encounter: Payer: Self-pay | Admitting: Physical Medicine and Rehabilitation

## 2020-06-01 ENCOUNTER — Other Ambulatory Visit: Payer: Self-pay

## 2020-06-01 ENCOUNTER — Ambulatory Visit (INDEPENDENT_AMBULATORY_CARE_PROVIDER_SITE_OTHER): Payer: Medicare HMO | Admitting: Physical Medicine and Rehabilitation

## 2020-06-01 VITALS — BP 108/78 | HR 93 | Wt 189.6 lb

## 2020-06-01 DIAGNOSIS — M5010 Cervical disc disorder with radiculopathy, unspecified cervical region: Secondary | ICD-10-CM | POA: Diagnosis not present

## 2020-06-01 DIAGNOSIS — M5481 Occipital neuralgia: Secondary | ICD-10-CM | POA: Diagnosis not present

## 2020-06-01 DIAGNOSIS — G8929 Other chronic pain: Secondary | ICD-10-CM

## 2020-06-01 DIAGNOSIS — M25511 Pain in right shoulder: Secondary | ICD-10-CM

## 2020-06-01 DIAGNOSIS — M542 Cervicalgia: Secondary | ICD-10-CM | POA: Diagnosis not present

## 2020-06-01 DIAGNOSIS — M47812 Spondylosis without myelopathy or radiculopathy, cervical region: Secondary | ICD-10-CM | POA: Diagnosis not present
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
## 2020-06-01 NOTE — Progress Notes (Signed)
Fell and hit head and has been having headaches. Has been having shots in neck and head for this. Right shoulder pain since January.  Numeric Pain Rating Scale and Functional Assessment Average Pain 10 Pain Right Now 6 My pain is constant, dull and aching Pain is worse with: some activites Pain improves with: nothing   In the last MONTH (on 0-10 scale) has pain interfered with the following?  1. General activity like being  able to carry out your everyday physical activities such as walking, climbing stairs, carrying groceries, or moving a chair?  Rating(10)  2. Relation with others like being able to carry out your usual social activities and roles such as  activities at home, at work and in your community. Rating(10)  3. Enjoyment of life such that you have  been bothered by emotional problems such as feeling anxious, depressed or irritable?  Rating(10)

## 2020-06-02 ENCOUNTER — Encounter: Payer: Self-pay | Admitting: Physical Medicine and Rehabilitation

## 2020-06-02 DIAGNOSIS — M47812 Spondylosis without myelopathy or radiculopathy, cervical region: Secondary | ICD-10-CM | POA: Insufficient documentation

## 2020-06-02 DIAGNOSIS — M25511 Pain in right shoulder: Secondary | ICD-10-CM | POA: Insufficient documentation

## 2020-06-02 DIAGNOSIS — G8929 Other chronic pain: Secondary | ICD-10-CM | POA: Insufficient documentation

## 2020-06-09 ENCOUNTER — Telehealth: Payer: Self-pay | Admitting: Physical Medicine and Rehabilitation

## 2020-06-09 NOTE — Telephone Encounter (Signed)
If upper neck/occipital then c2-3 ? Valium

## 2020-06-09 NOTE — Telephone Encounter (Signed)
Patient is scheduled for injection on 11/4. She would like Valium prior. Pharmacy is correct.

## 2020-06-09 NOTE — Telephone Encounter (Signed)
Needs auth for 82800- bilateral C2-3 nerve block. Scheduled for 11/4.

## 2020-06-09 NOTE — Telephone Encounter (Signed)
Ok to schedule bilateral C2-3 and third occipital nerve block mentioned in 10/19 OV note? Cervical ESI is also mentioned, but patient will not see Dr. Roda Shutters about shoulder until 11/2 and she wanted that before [facility].

## 2020-06-09 NOTE — Telephone Encounter (Signed)
Patient called needing an appointment with Dr Alvester Morin for her neck and back. The number to contact patient is [phone]

## 2020-06-10 ENCOUNTER — Other Ambulatory Visit: Payer: Self-pay | Admitting: Physical Medicine and Rehabilitation

## 2020-06-10 DIAGNOSIS — F411 Generalized anxiety disorder: Secondary | ICD-10-CM

## 2020-06-10 MED ORDER — DIAZEPAM 5 MG PO TABS
ORAL_TABLET | ORAL | 0 refills | Status: DC
Start: 2020-06-10 — End: 2020-09-09

## 2020-06-10 NOTE — Telephone Encounter (Signed)
Done

## 2020-06-10 NOTE — Progress Notes (Signed)
Pre-procedure diazepam ordered for pre-operative anxiety.  

## 2020-06-10 NOTE — Telephone Encounter (Signed)
Pt has medicare part B, no Auth# needed.

## 2020-06-15 ENCOUNTER — Ambulatory Visit: Payer: Medicare HMO | Admitting: Orthopaedic Surgery

## 2020-06-17 ENCOUNTER — Ambulatory Visit: Payer: Medicare HMO | Admitting: Physical Medicine and Rehabilitation

## 2020-06-17 ENCOUNTER — Encounter: Payer: Medicare HMO | Admitting: Internal Medicine

## 2020-06-18 ENCOUNTER — Encounter: Payer: Medicare HMO | Admitting: Internal Medicine

## 2020-06-25 DIAGNOSIS — K731 Chronic lobular hepatitis, not elsewhere classified: Secondary | ICD-10-CM | POA: Diagnosis not present

## 2020-06-25 DIAGNOSIS — K7689 Other specified diseases of liver: Secondary | ICD-10-CM | POA: Diagnosis not present

## 2020-06-25 DIAGNOSIS — K754 Autoimmune hepatitis: Secondary | ICD-10-CM | POA: Diagnosis not present

## 2020-07-02 ENCOUNTER — Telehealth: Payer: Self-pay

## 2020-07-02 NOTE — Telephone Encounter (Signed)
RTC, No answer, no VM. SChaplin, RN,BSN

## 2020-07-02 NOTE — Telephone Encounter (Signed)
Requesting liver biopsy test results, please call pt back.

## 2020-07-05 ENCOUNTER — Encounter (HOSPITAL_COMMUNITY): Payer: Self-pay | Admitting: Emergency Medicine

## 2020-07-05 ENCOUNTER — Ambulatory Visit (HOSPITAL_COMMUNITY)
Admission: EM | Admit: 2020-07-05 | Discharge: 2020-07-05 | Disposition: A | Discharge status: Home / Self Care | Payer: Medicare HMO | Attending: Family Medicine | Admitting: Family Medicine

## 2020-07-05 ENCOUNTER — Other Ambulatory Visit: Payer: Self-pay

## 2020-07-05 ENCOUNTER — Telehealth: Payer: Self-pay

## 2020-07-05 DIAGNOSIS — R300 Dysuria: Secondary | ICD-10-CM | POA: Insufficient documentation

## 2020-07-05 DIAGNOSIS — Z113 Encounter for screening for infections with a predominantly sexual mode of transmission: Secondary | ICD-10-CM | POA: Insufficient documentation

## 2020-07-05 DIAGNOSIS — N898 Other specified noninflammatory disorders of vagina: Secondary | ICD-10-CM | POA: Diagnosis present

## 2020-07-05 DIAGNOSIS — L989 Disorder of the skin and subcutaneous tissue, unspecified: Secondary | ICD-10-CM | POA: Diagnosis not present

## 2020-07-05 DIAGNOSIS — N760 Acute vaginitis: Secondary | ICD-10-CM | POA: Insufficient documentation

## 2020-07-05 DIAGNOSIS — L739 Follicular disorder, unspecified: Secondary | ICD-10-CM | POA: Insufficient documentation

## 2020-07-05 LAB — POCT URINALYSIS DIPSTICK, ED / UC
Bilirubin Urine: NEGATIVE
Glucose, UA: NEGATIVE mg/dL
Hgb urine dipstick: NEGATIVE
Ketones, ur: NEGATIVE mg/dL
Leukocytes,Ua: NEGATIVE
Nitrite: NEGATIVE
Protein, ur: NEGATIVE mg/dL
Specific Gravity, Urine: 1.02 (ref 1.005–1.030)
Urobilinogen, UA: 0.2 mg/dL (ref 0.0–1.0)
pH: 6 (ref 5.0–8.0)

## 2020-07-05 LAB — RPR: RPR Ser Ql: NONREACTIVE

## 2020-07-05 LAB — HIV ANTIBODY (ROUTINE TESTING W REFLEX): HIV Screen 4th Generation wRfx: NONREACTIVE

## 2020-07-05 MED ORDER — MUPIROCIN 2 % EX OINT: 1.0000 "application " | TOPICAL_OINTMENT | Freq: Two times a day (BID) | CUTANEOUS | 1 refills | Status: DC | PRN

## 2020-07-05 MED ORDER — TRIAMCINOLONE ACETONIDE 0.1 % EX CREA: 1.0000 "application " | TOPICAL_CREAM | Freq: Two times a day (BID) | CUTANEOUS | 0 refills | Status: DC
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
## 2020-07-05 NOTE — Telephone Encounter (Signed)
Requesting test results from urgent care. Please call pt back.

## 2020-07-05 NOTE — Telephone Encounter (Signed)
Called patient and informed her that her urgent care tests for HIV, RPR, and urinalysis came back negative. Pending vaginal swab. All questions answered, she had no other questions at this time.

## 2020-07-05 NOTE — Telephone Encounter (Signed)
RTC to patient at [phone] (DIL's cell number).  Patient asking about Liver Bx results.  RN asked patient who ordered the BX?  Patient states "[facility] and they have already called me and told me that it wasn't good and I needed to come in person to talk about this.  I have an appt set up on 07/13/20.  I just thought my regular Dr. Eulah Citizen have also called me."  RN explained to patient that test results go to ordering physician and ordering physician releases results to patients.   RN informed patient that message will be sent to PCP team to make them aware that she has an upcoming appt on 11/30 w/ [facility] to discuss Liver Bx results. SChaplin, RN,BSN

## 2020-07-05 NOTE — Telephone Encounter (Signed)
Requesting test results, please call pt back.  

## 2020-07-05 NOTE — ED Triage Notes (Signed)
Noticed bumps in genital area,.  Patient requesting to swab herself-deferred to provider.  Reports fine, fine bumps to right hand.  Denies vaginal discharge, responded "im irritated

## 2020-07-06 LAB — CERVICOVAGINAL ANCILLARY ONLY
Bacterial Vaginitis (gardnerella): POSITIVE — AB
Candida Glabrata: NEGATIVE
Candida Vaginitis: NEGATIVE
Chlamydia: NEGATIVE
Comment: NEGATIVE
Comment: NEGATIVE
Comment: NEGATIVE
Comment: NEGATIVE
Comment: NEGATIVE
Comment: NORMAL
Neisseria Gonorrhea: NEGATIVE
Trichomonas: NEGATIVE

## 2020-07-07 ENCOUNTER — Telehealth (HOSPITAL_COMMUNITY): Payer: Self-pay | Admitting: Emergency Medicine

## 2020-07-07 MED ORDER — METRONIDAZOLE 500 MG PO TABS: 500.0000 mg | ORAL_TABLET | Freq: Two times a day (BID) | ORAL | 0 refills | Status: DC

## 2020-07-13 DIAGNOSIS — R7989 Other specified abnormal findings of blood chemistry: Secondary | ICD-10-CM | POA: Diagnosis not present

## 2020-07-13 DIAGNOSIS — K754 Autoimmune hepatitis: Secondary | ICD-10-CM | POA: Diagnosis not present

## 2020-07-13 DIAGNOSIS — Z7901 Long term (current) use of anticoagulants: Secondary | ICD-10-CM | POA: Diagnosis not present

## 2020-07-15 ENCOUNTER — Telehealth: Payer: Self-pay | Admitting: Physical Medicine and Rehabilitation

## 2020-07-15 NOTE — Telephone Encounter (Signed)
Pt called stating she would like to R/S her appt that was set for 06/17/20   [phone]

## 2020-07-16 ENCOUNTER — Telehealth: Payer: Self-pay

## 2020-07-16 DIAGNOSIS — E119 Type 2 diabetes mellitus without complications: Secondary | ICD-10-CM

## 2020-07-16 MED ORDER — ACCU-CHEK GUIDE ME W/DEVICE KIT: PACK | 0 refills | Status: DC

## 2020-07-16 MED ORDER — ACCU-CHEK GUIDE VI STRP: ORAL_STRIP | 3 refills | Status: DC

## 2020-07-16 MED ORDER — ACCU-CHEK SOFTCLIX LANCETS MISC: 3 refills | Status: DC

## 2020-07-16 MED ORDER — "INSULIN SYRINGE-NEEDLE U-100 31G X 15/64"" 0.3 ML MISC": 4 refills | Status: DC

## 2020-07-16 NOTE — Telephone Encounter (Signed)
PT IS REQUEST NEW METER. PL CALL PT BACK

## 2020-07-16 NOTE — Telephone Encounter (Signed)
Lupita Leash, Can you please call this patient back about her meter? Thank you, Kaslyn Richburg

## 2020-07-16 NOTE — Telephone Encounter (Signed)
Rescheduled

## 2020-07-16 NOTE — Telephone Encounter (Signed)
Call to patient: she Needs meter, strips, lancets  for testing 5 times a day and syringes. She says she is willing to have [place] order supplies for 3x a day so she can be sure to get is as soon as possible. She will call to let us know when she wants to change to insulin pens and 5 times a day testing.

## 2020-07-19 ENCOUNTER — Encounter: Payer: Self-pay | Admitting: Internal Medicine

## 2020-07-19 ENCOUNTER — Other Ambulatory Visit: Payer: Self-pay

## 2020-07-19 ENCOUNTER — Ambulatory Visit (INDEPENDENT_AMBULATORY_CARE_PROVIDER_SITE_OTHER): Payer: Medicare HMO | Admitting: Internal Medicine

## 2020-07-19 ENCOUNTER — Telehealth: Payer: Self-pay | Admitting: *Deleted

## 2020-07-19 DIAGNOSIS — F3289 Other specified depressive episodes: Secondary | ICD-10-CM | POA: Diagnosis not present

## 2020-07-19 DIAGNOSIS — Z794 Long term (current) use of insulin: Secondary | ICD-10-CM | POA: Diagnosis not present

## 2020-07-19 DIAGNOSIS — Z0000 Encounter for general adult medical examination without abnormal findings: Secondary | ICD-10-CM | POA: Diagnosis not present

## 2020-07-19 DIAGNOSIS — E119 Type 2 diabetes mellitus without complications: Secondary | ICD-10-CM | POA: Diagnosis not present

## 2020-07-19 DIAGNOSIS — M8949 Other hypertrophic osteoarthropathy, multiple sites: Secondary | ICD-10-CM

## 2020-07-19 DIAGNOSIS — Z1231 Encounter for screening mammogram for malignant neoplasm of breast: Secondary | ICD-10-CM

## 2020-07-19 DIAGNOSIS — M159 Polyosteoarthritis, unspecified: Secondary | ICD-10-CM

## 2020-07-19 NOTE — Progress Notes (Signed)
This AWV is being conducted by TELEHEALTH - AUDIO only. The patient was located at home and I was located in [facility]. The patient's identity was confirmed using their DOB and current address. The patient or his/her legal guardian has consented to being evaluated through a telephone encounter and understands the associated risks (an examination cannot be done and the patient may need to come in for an appointment) / benefits (allows the patient to remain at home, decreasing exposure to coronavirus). I personally spent 50 minutes conducting the AWV.  Subjective:   Hannah Young is a 54 y.o. female who presents for a Medicare Annual Wellness Visit.  The following items have been reviewed and updated today in the appropriate area in the EMR.   Health Risk Assessment  Height, weight, BMI, and BP Visual acuity if needed Depression screen Fall risk / safety level Advance directive discussion Medical and family history were reviewed and updated Updating list of other providers & suppliers Medication reconciliation, including over the counter medicines Cognitive screen Written screening schedule Risk Factor list Personalized health advice, risky behaviors, and treatment advice  Social History   Social History Narrative   Current Social History 07/19/2020        Patient lives 2 Grandchildren in a home which is 1 story. There are steps up to the entrance the patient uses.       Patient's method of transportation is her "aide".      The highest level of education was high school diploma.      The patient currently disabled.      Identified important Relationships are Children and Grandchildren       Pets : yes       Interests / Fun: "nothing        Current Stressors: health, isolation             Objective:    Vitals: There were no vitals taken for this visit. Vitals are unable to obtained due to COVID-19 public health emergency  Activities of Daily Living In your present  state of health, do you have any difficulty performing the following activities: 03/25/2020 02/12/2020  Hearing? N N  Vision? N N  Comment - -  Difficulty concentrating or making decisions? N N  Comment - -  Walking or climbing stairs? Y Y  Dressing or bathing? Y Y  Doing errands, shopping? Y Y  Comment - -  Some recent data might be hidden    Goals Goals    .  Increase physical activity (pt-stated)      Increase how much I am able to walk.         Fall Risk Fall Risk  07/19/2020 03/25/2020 02/12/2020 09/02/2019 02/10/2019  Falls in the past year? 1 1 1 1 1   Number falls in past yr: 1 0 0 1 1  Comment - - - - 4  Injury with Fall? 1 1 1 1 1   Comment - - - - scraped knees  Risk for fall due to : History of fall(s);Impaired mobility History of fall(s);Impaired balance/gait;Impaired mobility Impaired mobility;Other (Comment) History of fall(s);Impaired balance/gait;Impaired mobility History of fall(s);Impaired balance/gait;Impaired mobility  Risk for fall due to: Comment - - - blanked out and legs gave out -  Follow up Falls evaluation completed Falls evaluation completed Falls prevention discussed Falls prevention discussed Falls prevention discussed    Depression Screen PHQ 2/9 Scores 07/19/2020 03/25/2020 02/12/2020 11/24/2019  PHQ - 2 Score 2 4 4  3  PHQ- 9 Score 11 19 14 19      Cognitive Testing Six-Item Cognitive Screener   "I would like to ask you some questions that ask you to use your memory. I am going to name three objects. Please wait until I say all three words, then repeat them. Remember what they are  because I am going to ask you to name them again in a few minutes. Please repeat these words for me: APPLE--TABLE--PENNY." (Interviewer may repeat names 3 times if necessary but repetition not scored.)  Did patient correctly repeat all three words? Yes - may proceed with screen  What year is this? Correct What month is this? Correct What day of the week is this?  Correct  What were the three objects I asked you to remember? . Apple Correct . Table Incorrect . Penny Correct  Score one point for each incorrect answer.  A score of 2 or more points warrants additional investigation.  Patient's score 1   PHQ9=11     Assessment and Plan:    During the course of the visit the patient was educated and counseled about appropriate screening and preventive services as documented in the assessment and plan.  The patient scored 11 on her PHQ9, she denied any suicidal or homicidal ideation.  She has a history of depression and states she is overwhelmed with "all of her current health problems".  A referral was placed today for Behavioral Health Therapy, as well as, CCM.  The patient is due for a follow up visit, she is requesting that she see her PCP and BHT on the same day due to transportation issues.  A message was sent to the front desk to assist with scheduling these appointments.  She is currently seeing [facility] and was reminded to speak with them at her next scheduled office visit regarding need for screening colonoscopy which she verbalized understanding.  The printed AVS was given to the patient and included an updated screening schedule, a list of risk factors, and personalized health advice.        , RN  07/19/2020

## 2020-07-19 NOTE — Chronic Care Management (AMB) (Signed)
  Chronic Care Management   Note  07/19/2020 Name: Hannah Young MRN: [id number] DOB: 12-Jun-1966  Hannah Young is a 54 y.o. year old female who is a primary care patient of Aslam, Loralyn Freshwater, MD. I reached out to Donalynn Furlong by phone today in response to a referral sent by Hannah Young's PCP, Harvie Heck, MD.      Hannah Young was given information about Chronic Care Management services today including:  1. CCM service includes personalized support from designated clinical staff supervised by her physician, including individualized plan of care and coordination with other care providers 2. 24/7 contact phone numbers for assistance for urgent and routine care needs. 3. Service will only be billed when office clinical staff spend 20 minutes or more in a month to coordinate care. 4. Only one practitioner may furnish and bill the service in a calendar month. 5. The patient may stop CCM services at any time (effective at the end of the month) by phone call to the office staff. 6. The patient will be responsible for cost sharing (co-pay) of up to 20% of the service fee (after annual deductible is met).  Patient agreed to services and verbal consent obtained.   Follow up plan: Telephone appointment with care management team member scheduled for:07/27/2020  [place] [facility]

## 2020-07-19 NOTE — Patient Instructions (Addendum)
Things That May Be Affecting Your Health:  Alcohol  Hearing loss  Pain    Depression  Home Safety Y Sexual Health  Y Diabetes  Lack of physical activity  Stress   Difficulty with daily activities  Loneliness  Tiredness   Drug use  Medicines  Tobacco use   Falls  Motor Vehicle Safety  Weight   Food choices  Oral Health  Other    YOUR PERSONALIZED HEALTH PLAN : 1. Schedule your next subsequent Medicare Wellness visit in one year 2. Attend all of your regular appointments to address your medical issues 3. Please call [facility] at [phone] to schedule your mammogram, an order for this test has been placed. 4.  A referral for Chronic Care Management has been placed today, someone from our office will be reaching out to you for scheduling. 5.  A referral to Dr. Monna Fam, Internal Medicine [facility] [place] Therapist, has been placed today.  Someone from our office will be reaching out to you for scheduling. 6.  Someone from scheduling will be reaching out to you to assist you in scheduling your return visit with Dr. Mcarthur Rossetti on the same day as Dr. Monna Fam. 7.  Let us know if you receive the flu vaccine elsewhere, otherwise we will give it to you at your next visit Begin seated and standing exercises with the enclosed exercise band.     Annual Wellness Visit                       Medicare Covered Preventative Screenings and Services  Services & Screenings Men and Women Who How Often Need? Date of Last Service Action  Abdominal Aortic Aneurysm Adults with AAA risk factors Once     Alcohol Misuse and Counseling All Adults Screening once a year if no alcohol misuse. Counseling up to 4 face to face sessions.     Bone Density Measurement  Adults at risk for osteoporosis Once every 2 yrs     Lipid Panel Z13.6 All adults without CV disease Once every 5 yrs YES 12/2009   Colorectal Cancer   Stool sample or  Colonoscopy All adults 50 and older    Once every year  Every 10 years YES Never   Depression All Adults Once a year YES Today   Diabetes Screening Blood glucose, post glucose load, or GTT Z13.1  All adults at risk  Pre-diabetics  Once per year  Twice per year     Diabetes  Self-Management Training All adults Diabetics 10 hrs first year; 2 hours subsequent years. Requires Copay     Glaucoma  Diabetics  Family history of glaucoma  African Americans 50 yrs +  Hispanic Americans 65 yrs + Annually - requires coppay YES    Hepatitis C Z72.89 or F19.20  High Risk for HCV  Born between 1945 and 1965  Annually  Once     HIV Z11.4 All adults based on risk  Annually btw ages 36 & 19 regardless of risk  Annually > 65 yrs if at increased risk     Lung Cancer Screening Asymptomatic adults aged 39-77 with 30 pack yr history and current smoker OR quit within the last 15 yrs Annually Must have counseling and shared decision making documentation before first screen     Medical Nutrition Therapy Adults with   Diabetes  Renal disease  Kidney transplant within past 3 yrs 3 hours first year; 2 hours subsequent years YES  Obesity and Counseling All adults Screening once a year Counseling if BMI 30 or higher  Today   Tobacco Use Counseling Adults who use tobacco  Up to 8 visits in one year     Vaccines Z23  Hepatitis B  Influenza   Pneumonia  Adults   Once  Once every flu season  Two different vaccines separated by one year YES    Next Annual Wellness Visit People with Medicare Every year  Today     Services & Screenings Women Who How Often Need  Date of Last Service Action  Mammogram  Z12.31 Women over 40 One baseline ages 25-39. Annually ager 40 yrs+     Pap tests All women Annually if high risk. Every 2 yrs for normal risk women     Screening for cervical cancer with   Pap (Z01.419 nl or Z01.411abnl) &  HPV Z11.51 Women aged 4 to 65 Once every 5 yrs      Screening pelvic and breast exams All women Annually if high risk. Every 2 yrs for normal risk women     Sexually Transmitted Diseases  Chlamydia  Gonorrhea  Syphilis All at risk adults Annually for non pregnant females at increased risk YES        Services & Screenings Men Who How Ofter Need  Date of Last Service Action  Prostate Cancer - DRE & PSA Men over 50 Annually.  DRE might require a copay.     Sexually Transmitted Diseases  Syphilis All at risk adults Annually for men at increased risk        Diabetes Mellitus and Foot Care Foot care is an important part of your health, especially when you have diabetes. Diabetes may cause you to have problems because of poor blood flow (circulation) to your feet and legs, which can cause your skin to:  Become thinner and drier.  Break more easily.  Heal more slowly.  Peel and crack. You may also have nerve damage (neuropathy) in your legs and feet, causing decreased feeling in them. This means that you may not notice minor injuries to your feet that could lead to more serious problems. Noticing and addressing any potential problems early is the best way to prevent future foot problems. How to care for your feet Foot hygiene  Wash your feet daily with warm water and mild soap. Do not use hot water. Then, pat your feet and the areas between your toes until they are completely dry. Do not soak your feet as this can dry your skin.  Trim your toenails straight across. Do not dig under them or around the cuticle. File the edges of your nails with an emery board or nail file.  Apply a moisturizing lotion or petroleum jelly to the skin on your feet and to dry, brittle toenails. Use lotion that does not contain alcohol and is unscented. Do not apply lotion between your toes. Shoes and socks  Wear clean socks or stockings every day. Make sure they are not too tight. Do not wear knee-high stockings since they may decrease  blood flow to your legs.  Wear shoes that fit properly and have enough cushioning. Always look in your shoes before you put them on to be sure there are no objects inside.  To break in new shoes, wear them for just a few hours a day. This prevents injuries on your feet. Wounds, scrapes, corns, and calluses  Check your feet daily for blisters, cuts, bruises, sores, and redness.  If you cannot see the bottom of your feet, use a mirror or ask someone for help.  Do not cut corns or calluses or try to remove them with medicine.  If you find a minor scrape, cut, or break in the skin on your feet, keep it and the skin around it clean and dry. You may clean these areas with mild soap and water. Do not clean the area with peroxide, alcohol, or iodine.  If you have a wound, scrape, corn, or callus on your foot, look at it several times a day to make sure it is healing and not infected. Check for: ? Redness, swelling, or pain. ? Fluid or blood. ? Warmth. ? Pus or a bad smell. General instructions  Do not cross your legs. This may decrease blood flow to your feet.  Do not use heating pads or hot water bottles on your feet. They may burn your skin. If you have lost feeling in your feet or legs, you may not know this is happening until it is too late.  Protect your feet from hot and cold by wearing shoes, such as at the beach or on hot pavement.  Schedule a complete foot exam at least once a year (annually) or more often if you have foot problems. If you have foot problems, report any cuts, sores, or bruises to your health care provider immediately. Contact a health care provider if:  You have a medical condition that increases your risk of infection and you have any cuts, sores, or bruises on your feet.  You have an injury that is not healing.  You have redness on your legs or feet.  You feel burning or tingling in your legs or feet.  You have pain or cramps in your legs and feet.  Your legs  or feet are numb.  Your feet always feel cold.  You have pain around a toenail. Get help right away if:  You have a wound, scrape, corn, or callus on your foot and: ? You have pain, swelling, or redness that gets worse. ? You have fluid or blood coming from the wound, scrape, corn, or callus. ? Your wound, scrape, corn, or callus feels warm to the touch. ? You have pus or a bad smell coming from the wound, scrape, corn, or callus. ? You have a fever. ? You have a red line going up your leg. Summary  Check your feet every day for cuts, sores, red spots, swelling, and blisters.  Moisturize feet and legs daily.  Wear shoes that fit properly and have enough cushioning.  If you have foot problems, report any cuts, sores, or bruises to your health care provider immediately.  Schedule a complete foot exam at least once a year (annually) or more often if you have foot problems. This information is not intended to replace advice given to you by your health care provider. Make sure you discuss any questions you have with your health care provider. Document Revised: 04/23/2019 Document Reviewed: 09/01/2016 Elsevier Patient Education  2020 [employer].   Fall Prevention in the Home, Adult Falls can cause injuries. They can happen to people of all ages. There are many things you can do to make your home safe and to help prevent falls. Ask for help when making these changes, if needed. What actions can I take to prevent falls? General Instructions  Use good lighting in all rooms. Replace any light bulbs that burn out.  Turn on the lights when  you go into a dark area. Use night-lights.  Keep items that you use often in easy-to-reach places. Lower the shelves around your home if necessary.  Set up your furniture so you have a clear path. Avoid moving your furniture around.  Do not have throw rugs and other things on the floor that can make you trip.  Avoid walking on wet floors.  If  any of your floors are uneven, fix them.  Add color or contrast paint or tape to clearly mark and help you see: ? Any grab bars or handrails. ? First and last steps of stairways. ? Where the edge of each step is.  If you use a stepladder: ? Make sure that it is fully opened. Do not climb a closed stepladder. ? Make sure that both sides of the stepladder are locked into place. ? Ask someone to hold the stepladder for you while you use it.  If there are any pets around you, be aware of where they are. What can I do in the bathroom?      Keep the floor dry. Clean up any water that spills onto the floor as soon as it happens.  Remove soap buildup in the tub or shower regularly.  Use non-skid mats or decals on the floor of the tub or shower.  Attach bath mats securely with double-sided, non-slip rug tape.  If you need to sit down in the shower, use a plastic, non-slip stool.  Install grab bars by the toilet and in the tub and shower. Do not use towel bars as grab bars. What can I do in the bedroom?  Make sure that you have a light by your bed that is easy to reach.  Do not use any sheets or blankets that are too big for your bed. They should not hang down onto the floor.  Have a firm chair that has side arms. You can use this for support while you get dressed. What can I do in the kitchen?  Clean up any spills right away.  If you need to reach something above you, use a strong step stool that has a grab bar.  Keep electrical cords out of the way.  Do not use floor polish or wax that makes floors slippery. If you must use wax, use non-skid floor wax. What can I do with my stairs?  Do not leave any items on the stairs.  Make sure that you have a light switch at the top of the stairs and the bottom of the stairs. If you do not have them, ask someone to add them for you.  Make sure that there are handrails on both sides of the stairs, and use them. Fix handrails that are  broken or loose. Make sure that handrails are as long as the stairways.  Install non-slip stair treads on all stairs in your home.  Avoid having throw rugs at the top or bottom of the stairs. If you do have throw rugs, attach them to the floor with carpet tape.  Choose a carpet that does not hide the edge of the steps on the stairway.  Check any carpeting to make sure that it is firmly attached to the stairs. Fix any carpet that is loose or worn. What can I do on the outside of my home?  Use bright outdoor lighting.  Regularly fix the edges of walkways and driveways and fix any cracks.  Remove anything that might make you trip as you walk through  a door, such as a raised step or threshold.  Trim any bushes or trees on the path to your home.  Regularly check to see if handrails are loose or broken. Make sure that both sides of any steps have handrails.  Install guardrails along the edges of any raised decks and porches.  Clear walking paths of anything that might make someone trip, such as tools or rocks.  Have any leaves, snow, or ice cleared regularly.  Use sand or salt on walking paths during winter.  Clean up any spills in your garage right away. This includes grease or oil spills. What other actions can I take?  Wear shoes that: ? Have a low heel. Do not wear high heels. ? Have rubber bottoms. ? Are comfortable and fit you well. ? Are closed at the toe. Do not wear open-toe sandals.  Use tools that help you move around (mobility aids) if they are needed. These include: ? Canes. ? Walkers. ? Scooters. ? Crutches.  Review your medicines with your doctor. Some medicines can make you feel dizzy. This can increase your chance of falling. Ask your doctor what other things you can do to help prevent falls. Where to find more information  Centers for Disease Control and Prevention, [employer]: HealthcareCounselor.com.pt  [employer] on Aging: RingConnections.si Contact  a doctor if:  You are afraid of falling at home.  You feel weak, drowsy, or dizzy at home.  You fall at home. Summary  There are many simple things that you can do to make your home safe and to help prevent falls.  Ways to make your home safe include removing tripping hazards and installing grab bars in the bathroom.  Ask for help when making these changes in your home. This information is not intended to replace advice given to you by your health care provider. Make sure you discuss any questions you have with your health care provider. Document Revised: 11/21/2018 Document Reviewed: 03/15/2017 Elsevier Patient Education  2020 [employer].   Health Maintenance, Female Adopting a healthy lifestyle and getting preventive care are important in promoting health and wellness. Ask your health care provider about:  The right schedule for you to have regular tests and exams.  Things you can do on your own to prevent diseases and keep yourself healthy. What should I know about diet, weight, and exercise? Eat a healthy diet   Eat a diet that includes plenty of vegetables, fruits, low-fat dairy products, and lean protein.  Do not eat a lot of foods that are high in solid fats, added sugars, or sodium. Maintain a healthy weight Body mass index (BMI) is used to identify weight problems. It estimates body fat based on height and weight. Your health care provider can help determine your BMI and help you achieve or maintain a healthy weight. Get regular exercise Get regular exercise. This is one of the most important things you can do for your health. Most adults should:  Exercise for at least 150 minutes each week. The exercise should increase your heart rate and make you sweat (moderate-intensity exercise).  Do strengthening exercises at least twice a week. This is in addition to the moderate-intensity exercise.  Spend less time sitting. Even light physical activity can be  beneficial. Watch cholesterol and blood lipids Have your blood tested for lipids and cholesterol at 52 years of age, then have this test every 5 years. Have your cholesterol levels checked more often if:  Your lipid or cholesterol levels  are high.  You are older than 52 years of age.  You are at high risk for heart disease. What should I know about cancer screening? Depending on your health history and family history, you may need to have cancer screening at various ages. This may include screening for:  Breast cancer.  Cervical cancer.  Colorectal cancer.  Skin cancer.  Lung cancer. What should I know about heart disease, diabetes, and high blood pressure? Blood pressure and heart disease  High blood pressure causes heart disease and increases the risk of stroke. This is more likely to develop in people who have high blood pressure readings, are of African descent, or are overweight.  Have your blood pressure checked: ? Every 3-5 years if you are 72-52 years of age. ? Every year if you are 65 years old or older. Diabetes Have regular diabetes screenings. This checks your fasting blood sugar level. Have the screening done:  Once every three years after age 32 if you are at a normal weight and have a low risk for diabetes.  More often and at a younger age if you are overweight or have a high risk for diabetes. What should I know about preventing infection? Hepatitis B If you have a higher risk for hepatitis B, you should be screened for this virus. Talk with your health care provider to find out if you are at risk for hepatitis B infection. Hepatitis C Testing is recommended for:  Everyone born from [birth year] through 1963.  Anyone with known risk factors for hepatitis C. Sexually transmitted infections (STIs)  Get screened for STIs, including gonorrhea and chlamydia, if: ? You are sexually active and are younger than 52 years of age. ? You are older than 52 years of age and  your health care provider tells you that you are at risk for this type of infection. ? Your sexual activity has changed since you were last screened, and you are at increased risk for chlamydia or gonorrhea. Ask your health care provider if you are at risk.  Ask your health care provider about whether you are at high risk for HIV. Your health care provider may recommend a prescription medicine to help prevent HIV infection. If you choose to take medicine to prevent HIV, you should first get tested for HIV. You should then be tested every 3 months for as long as you are taking the medicine. Pregnancy  If you are about to stop having your period (premenopausal) and you may become pregnant, seek counseling before you get pregnant.  Take 400 to 800 micrograms (mcg) of folic acid every day if you become pregnant.  Ask for birth control (contraception) if you want to prevent pregnancy. Osteoporosis and menopause Osteoporosis is a disease in which the bones lose minerals and strength with aging. This can result in bone fractures. If you are 30 years old or older, or if you are at risk for osteoporosis and fractures, ask your health care provider if you should:  Be screened for bone loss.  Take a calcium or vitamin D supplement to lower your risk of fractures.  Be given hormone replacement therapy (HRT) to treat symptoms of menopause. Follow these instructions at home: Lifestyle  Do not use any products that contain nicotine or tobacco, such as cigarettes, e-cigarettes, and chewing tobacco. If you need help quitting, ask your health care provider.  Do not use street drugs.  Do not share needles.  Ask your health care provider for help if you  need support or information about quitting drugs. Alcohol use  Do not drink alcohol if: ? Your health care provider tells you not to drink. ? You are pregnant, may be pregnant, or are planning to become pregnant.  If you drink alcohol: ? Limit how  much you use to 0-1 drink a day. ? Limit intake if you are breastfeeding.  Be aware of how much alcohol is in your drink. In the [place] one drink equals one 12 oz bottle of beer (355 mL), one 5 oz glass of wine (148 mL), or one 1 oz glass of hard liquor (44 mL). General instructions  Schedule regular health, dental, and eye exams.  Stay current with your vaccines.  Tell your health care provider if: ? You often feel depressed. ? You have ever been abused or do not feel safe at home. Summary  Adopting a healthy lifestyle and getting preventive care are important in promoting health and wellness.  Follow your health care provider's instructions about healthy diet, exercising, and getting tested or screened for diseases.  Follow your health care provider's instructions on monitoring your cholesterol and blood pressure. This information is not intended to replace advice given to you by your health care provider. Make sure you discuss any questions you have with your health care provider. Document Revised: 07/24/2018 Document Reviewed: 07/24/2018 Elsevier Patient Education  2020 [employer].   Mammogram A mammogram is a low energy X-ray of the breasts that is done to check for abnormal changes. This procedure can screen for and detect any changes that may indicate breast cancer. Mammograms are regularly done on women. A man may have a mammogram if he has a lump or swelling in his breast. A mammogram can also identify other changes and variations in the breast, such as:  Inflammation of the breast tissue (mastitis).  An infected area that contains a collection of pus (abscess).  A fluid-filled sac (cyst).  Fibrocystic changes. This is when breast tissue becomes denser, which can make the tissue feel rope-like or uneven under the skin.  Tumors that are not cancerous (benign). Tell a health care provider:  About any allergies you have.  If you have breast implants.  If you have  had previous breast disease, biopsy, or surgery.  If you are breastfeeding.  If you are younger than age 54.  If you have a family history of breast cancer.  Whether you are pregnant or may be pregnant. What are the risks? Generally, this is a safe procedure. However, problems may occur, including:  Exposure to radiation. Radiation levels are very low with this test.  The results being misinterpreted.  The need for further tests.  The inability of the mammogram to detect certain cancers. What happens before the procedure?  Schedule your test about 1-2 weeks after your menstrual period if you are still menstruating. This is usually when your breasts are the least tender.  If you have had a mammogram done at a different facility in the past, get the mammogram X-rays or have them sent to your current exam facility. The new and old images will be compared.  Wash your breasts and underarms on the day of the test.  Do not wear deodorants, perfumes, lotions, or powders anywhere on your body on the day of the test.  Remove any jewelry from your neck.  Wear clothes that you can change into and out of easily. What happens during the procedure?   You will undress from the waist up  and put on a gown that opens in the front.  You will stand in front of the X-ray machine.  Each breast will be placed between two plastic or glass plates. The plates will compress your breast for a few seconds. Try to stay as relaxed as possible during the procedure. This does not cause any harm to your breasts and any discomfort you feel will be very brief.  X-rays will be taken from different angles of each breast. The procedure may vary among health care providers and hospitals. What happens after the procedure?  The mammogram will be examined by a specialist (radiologist).  You may need to repeat certain parts of the test, depending on the quality of the images. This is commonly done if the  radiologist needs a better view of the breast tissue.  You may resume your normal activities.  It is up to you to get the results of your procedure. Ask your health care provider, or the department that is doing the procedure, when your results will be ready. Summary  A mammogram is a low energy X-ray of the breasts that is done to check for abnormal changes. A man may have a mammogram if he has a lump or swelling in his breast.  If you have had a mammogram done at a different facility in the past, get the mammogram X-rays or have them sent to your current exam facility in order to compare them.  Schedule your test about 1-2 weeks after your menstrual period if you are still menstruating.  For this test, each breast will be placed between two plastic or glass plates. The plates will compress your breast for a few seconds.  Ask when your test results will be ready. Make sure you get your test results. This information is not intended to replace advice given to you by your health care provider. Make sure you discuss any questions you have with your health care provider. Document Revised: 03/21/2018 Document Reviewed: 03/21/2018 Elsevier Patient Education  2020 [employer].

## 2020-07-22 DIAGNOSIS — R1013 Epigastric pain: Secondary | ICD-10-CM | POA: Diagnosis not present

## 2020-07-23 ENCOUNTER — Other Ambulatory Visit: Payer: Self-pay

## 2020-07-23 ENCOUNTER — Emergency Department (HOSPITAL_COMMUNITY): Payer: Medicare HMO

## 2020-07-23 ENCOUNTER — Ambulatory Visit: Payer: Medicare HMO | Admitting: Orthopaedic Surgery

## 2020-07-23 ENCOUNTER — Emergency Department (HOSPITAL_COMMUNITY)
Admission: EM | Admit: 2020-07-23 | Discharge: 2020-07-23 | Disposition: A | Discharge status: Home / Self Care | Payer: Medicare HMO | Attending: Emergency Medicine | Admitting: Emergency Medicine

## 2020-07-23 DIAGNOSIS — Z8541 Personal history of malignant neoplasm of cervix uteri: Secondary | ICD-10-CM | POA: Insufficient documentation

## 2020-07-23 DIAGNOSIS — F17210 Nicotine dependence, cigarettes, uncomplicated: Secondary | ICD-10-CM | POA: Insufficient documentation

## 2020-07-23 DIAGNOSIS — I10 Essential (primary) hypertension: Secondary | ICD-10-CM | POA: Insufficient documentation

## 2020-07-23 DIAGNOSIS — J45909 Unspecified asthma, uncomplicated: Secondary | ICD-10-CM | POA: Insufficient documentation

## 2020-07-23 DIAGNOSIS — Z79899 Other long term (current) drug therapy: Secondary | ICD-10-CM | POA: Diagnosis not present

## 2020-07-23 DIAGNOSIS — Z794 Long term (current) use of insulin: Secondary | ICD-10-CM | POA: Diagnosis not present

## 2020-07-23 DIAGNOSIS — E1165 Type 2 diabetes mellitus with hyperglycemia: Secondary | ICD-10-CM | POA: Insufficient documentation

## 2020-07-23 DIAGNOSIS — Z20822 Contact with and (suspected) exposure to covid-19: Secondary | ICD-10-CM | POA: Diagnosis not present

## 2020-07-23 DIAGNOSIS — R739 Hyperglycemia, unspecified: Secondary | ICD-10-CM

## 2020-07-23 DIAGNOSIS — R0602 Shortness of breath: Secondary | ICD-10-CM | POA: Diagnosis not present

## 2020-07-23 DIAGNOSIS — Z91040 Latex allergy status: Secondary | ICD-10-CM | POA: Diagnosis not present

## 2020-07-23 LAB — HEPATIC FUNCTION PANEL
ALT: 149 U/L — ABNORMAL HIGH (ref 0–44)
AST: 83 U/L — ABNORMAL HIGH (ref 15–41)
Albumin: 2.8 g/dL — ABNORMAL LOW (ref 3.5–5.0)
Alkaline Phosphatase: 126 U/L (ref 38–126)
Bilirubin, Direct: 0.3 mg/dL — ABNORMAL HIGH (ref 0.0–0.2)
Indirect Bilirubin: 0.7 mg/dL (ref 0.3–0.9)
Total Bilirubin: 1 mg/dL (ref 0.3–1.2)
Total Protein: 7 g/dL (ref 6.5–8.1)

## 2020-07-23 LAB — URINALYSIS, ROUTINE W REFLEX MICROSCOPIC
Bacteria, UA: NONE SEEN
Bilirubin Urine: NEGATIVE
Glucose, UA: >=500 mg/dL — AB
Hgb urine dipstick: NEGATIVE
Ketones, ur: NEGATIVE mg/dL
Leukocytes,Ua: NEGATIVE
Nitrite: NEGATIVE
Protein, ur: NEGATIVE mg/dL
Specific Gravity, Urine: 1.028 (ref 1.005–1.030)
pH: 7 (ref 5.0–8.0)

## 2020-07-23 LAB — CBC WITH DIFFERENTIAL/PLATELET
Abs Immature Granulocytes: 0.12 10*3/uL — ABNORMAL HIGH (ref 0.00–0.07)
Basophils Absolute: 0 10*3/uL (ref 0.0–0.1)
Basophils Relative: 0 %
Eosinophils Absolute: 0 10*3/uL (ref 0.0–0.5)
Eosinophils Relative: 0 %
HCT: 35.1 % — ABNORMAL LOW (ref 36.0–46.0)
Hemoglobin: 11.5 g/dL — ABNORMAL LOW (ref 12.0–15.0)
Immature Granulocytes: 1 %
Lymphocytes Relative: 13 %
Lymphs Abs: 1.6 10*3/uL (ref 0.7–4.0)
MCH: 34.2 pg — ABNORMAL HIGH (ref 26.0–34.0)
MCHC: 32.8 g/dL (ref 30.0–36.0)
MCV: 104.5 fL — ABNORMAL HIGH (ref 80.0–100.0)
Monocytes Absolute: 0.9 10*3/uL (ref 0.1–1.0)
Monocytes Relative: 7 %
Neutro Abs: 9.5 10*3/uL — ABNORMAL HIGH (ref 1.7–7.7)
Neutrophils Relative %: 79 %
Platelets: 423 10*3/uL — ABNORMAL HIGH (ref 150–400)
RBC: 3.36 MIL/uL — ABNORMAL LOW (ref 3.87–5.11)
RDW: 12.8 % (ref 11.5–15.5)
WBC: 12.1 10*3/uL — ABNORMAL HIGH (ref 4.0–10.5)
nRBC: 0 % (ref 0.0–0.2)

## 2020-07-23 LAB — I-STAT VENOUS BLOOD GAS, ED
Acid-Base Excess: 3 mmol/L — ABNORMAL HIGH (ref 0.0–2.0)
Bicarbonate: 27.8 mmol/L (ref 20.0–28.0)
Calcium, Ion: 1.13 mmol/L — ABNORMAL LOW (ref 1.15–1.40)
HCT: 36 % (ref 36.0–46.0)
Hemoglobin: 12.2 g/dL (ref 12.0–15.0)
O2 Saturation: 99 %
Patient temperature: 37
Potassium: 4.9 mmol/L (ref 3.5–5.1)
Sodium: 130 mmol/L — ABNORMAL LOW (ref 135–145)
TCO2: 29 mmol/L (ref 22–32)
pCO2, Ven: 40.4 mmHg — ABNORMAL LOW (ref 44.0–60.0)
pH, Ven: 7.445 — ABNORMAL HIGH (ref 7.250–7.430)
pO2, Ven: 139 mmHg — ABNORMAL HIGH (ref 32.0–45.0)

## 2020-07-23 LAB — BASIC METABOLIC PANEL
Anion gap: 12 (ref 5–15)
BUN: 21 mg/dL — ABNORMAL HIGH (ref 6–20)
CO2: 21 mmol/L — ABNORMAL LOW (ref 22–32)
Calcium: 8.8 mg/dL — ABNORMAL LOW (ref 8.9–10.3)
Chloride: 94 mmol/L — ABNORMAL LOW (ref 98–111)
Creatinine, Ser: 0.78 mg/dL (ref 0.44–1.00)
GFR, Estimated: >60 mL/min (ref >60)
Glucose, Bld: 603 mg/dL (ref 70–99)
Potassium: 5.2 mmol/L — ABNORMAL HIGH (ref 3.5–5.1)
Sodium: 127 mmol/L — ABNORMAL LOW (ref 135–145)

## 2020-07-23 LAB — CBG MONITORING, ED
Glucose-Capillary: >600 mg/dL (ref 70–99)
Glucose-Capillary: 269 mg/dL — ABNORMAL HIGH (ref 70–99)

## 2020-07-23 LAB — RESP PANEL BY RT-PCR (FLU A&B, COVID) ARPGX2
Influenza A by PCR: NEGATIVE
Influenza B by PCR: NEGATIVE
SARS Coronavirus 2 by RT PCR: NEGATIVE

## 2020-07-23 LAB — AMMONIA: Ammonia: 26 umol/L (ref 9–35)

## 2020-07-23 MED ORDER — INSULIN ASPART 100 UNIT/ML ~~LOC~~ SOLN
10.0000 [IU] | Freq: Once | SUBCUTANEOUS | Status: AC
  Administered 2020-07-23: 10 [IU] via INTRAVENOUS

## 2020-07-23 MED ORDER — SODIUM CHLORIDE 0.9 % IV BOLUS
1000.0000 mL | Freq: Once | INTRAVENOUS | Status: AC
  Administered 2020-07-23: 1000 mL via INTRAVENOUS

## 2020-07-23 NOTE — Discharge Instructions (Signed)
Please call to follow-up with your primary care physician this week. Your liver enzymes are much lower than our previous set of enzymes.  Your ammonia level is within normal limits.  Please continue to do your best to manage her blood sugar so it does not get significantly higher. Get help right away if: You have difficulty breathing. You have a change in how you think, feel, or act (mental status). You have nausea or vomiting that does not go away

## 2020-07-23 NOTE — ED Triage Notes (Signed)
Patient c/o high blood sugar. "high" with EMS glucometer, "high" in ED. Pt was started prednisone 2 weeks ago for liver cirrhosis. Also reports she is on chemo for cirrhosis, she reports she is due to go to [place] for biopsy, unsure if cancerous at this point but she has started chemo. Patient AAOx4, respirations even and nonlabored, skin warm and dry.

## 2020-07-23 NOTE — ED Notes (Signed)
Date and time results received: 07/23/20 0539  Test: Glucose Critical Value: 603  Name of Provider Notified: Dahlia Client, [place] already aware. Orders written.

## 2020-07-23 NOTE — ED Provider Notes (Signed)
Patient taken in sign out from [facility] Hannah Young patient came in today because her meter was reading "high."  She also states that her "chest has been feeling funny."  She denies pain or palpitations.  She states that her chest symptoms feel this way when her liver enzymes get high and she has a history of cirrhosis.  I have added on hepatic function panel and chest x-ray. Physical Exam  BP 112/76 (BP Location: Right Arm)   Pulse 62   Temp (!) 97.5 F (36.4 C) (Oral)   Resp 20   Ht 5\' 1"  (1.549 m)   Wt 81.6 kg   SpO2 100%   BMI 34.01 kg/m   Physical Exam Constitutional:      Appearance: She is not ill-appearing.  HENT:     Head: Normocephalic and atraumatic.     Mouth/Throat:     Mouth: Mucous membranes are moist.  Eyes:     General: No scleral icterus.    Extraocular Movements: Extraocular movements intact.     Conjunctiva/sclera: Conjunctivae normal.  Pulmonary:     Effort: Pulmonary effort is normal.  Abdominal:     General: There is no distension.  Musculoskeletal:        General: Normal range of motion.     Cervical back: Normal range of motion.  Neurological:     Mental Status: She is alert and oriented to person, place, and time.  Psychiatric:        Mood and Affect: Mood normal.        Behavior: Behavior normal.     ED Course/Procedures   Clinical Course as of 07/23/20 1111  Fri Jul 23, 2020  0722 ECG is not crossing over to epic.  I personally reviewed the printout, showing a NSR with HR 60, Qtc 411, no STEMI, no acute ischemic findings. [MT]    Clinical Course User Index [MT] Jul 25, 2020, MD   EKG shows sinus rhythm at a rate of 58 with early repole. Procedures  MDM  I have reviewed the patient's labs.  Patient given fluids and subcutaneous insulin with improvement in her blood sugar down to 269 hepatic function panel shows slightly elevated ALT AST, low albumin and mildly elevated bilirubin of insignificant value.  She does not appear to have any acute  elevation in her liver enzymes.  I personally ordered and reviewed her chest x-ray which shows no acute abnormalities.Terald Sleeper, Tiburcio Pea, PA-C 07/23/20 1112    14/10/21, DO 07/24/20 0120
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
## 2020-07-26 NOTE — Progress Notes (Addendum)
Internal Medicine Clinic Attending  Case discussed with Dr.  Christian  at the time of the visit.  We reviewed the AWV findings.  I agree with the assessment, diagnosis, and plan of care documented in the AWV note.     

## 2020-07-26 NOTE — Progress Notes (Signed)
I discussed the AWV findings with the RN who conducted the visit. I was present in the office suite and immediately available to provide assistance and direction throughout the time the service was provided.  Elige Radon, MD Internal Medicine Resident PGY-2 [facility] Pager: [phone] 07/26/2020 9:23 AM

## 2020-07-27 ENCOUNTER — Telehealth: Payer: Self-pay

## 2020-07-27 ENCOUNTER — Telehealth: Payer: Medicare HMO

## 2020-07-27 NOTE — Telephone Encounter (Signed)
  Chronic Care Management   Outreach Note  07/27/2020 Name: Hannah Young MRN: [id number] DOB: [birth year]  Referred by: Hannah Bottom, MD Reason for referral : Care Coordination   Hannah Young is enrolled in a Managed Medicaid Health Plan: No  Patient did not answer for scheduled assessment today.  Phone rang numerous times with no answer or option to leave message.    Hannah Young, BSW Embedded Care Coordination Social Worker [facility] [phone]

## 2020-07-30 ENCOUNTER — Other Ambulatory Visit: Payer: Self-pay

## 2020-07-30 ENCOUNTER — Encounter: Payer: Self-pay | Admitting: Orthopaedic Surgery

## 2020-07-30 ENCOUNTER — Ambulatory Visit (INDEPENDENT_AMBULATORY_CARE_PROVIDER_SITE_OTHER): Payer: Medicare HMO | Admitting: Orthopaedic Surgery

## 2020-07-30 DIAGNOSIS — M1711 Unilateral primary osteoarthritis, right knee: Secondary | ICD-10-CM

## 2020-07-30 DIAGNOSIS — M25511 Pain in right shoulder: Secondary | ICD-10-CM | POA: Diagnosis not present

## 2020-07-30 DIAGNOSIS — G8929 Other chronic pain: Secondary | ICD-10-CM
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
## 2020-08-04 ENCOUNTER — Telehealth: Payer: Medicare HMO

## 2020-08-04 ENCOUNTER — Telehealth: Payer: Self-pay

## 2020-08-04 NOTE — Telephone Encounter (Addendum)
  Chronic Care Management   Outreach Note  08/04/2020 Name: ASHLAY ALTIERI MRN: [id number] DOB: 1966-02-21  Referred by: Eliezer Bottom, MD Reason for referral : Care Coordination   [name] is enrolled in a Managed Medicaid Health Plan: No  Second unsuccessful attempt to complete SW assessment today.   Phone rang numerous times with no answer or option to leave message.    Malachy Chamber, BSW Embedded Care Coordination Social Worker [facility] [phone]

## 2020-08-16 ENCOUNTER — Ambulatory Visit: Payer: Medicare HMO | Admitting: Physical Medicine and Rehabilitation

## 2020-08-25 ENCOUNTER — Institutional Professional Consult (permissible substitution): Payer: Medicare HMO | Admitting: Behavioral Health

## 2020-08-25 ENCOUNTER — Encounter: Payer: Medicare HMO | Admitting: Internal Medicine

## 2020-08-31 ENCOUNTER — Ambulatory Visit: Payer: Medicare HMO | Admitting: Orthopaedic Surgery

## 2020-09-03 ENCOUNTER — Ambulatory Visit: Payer: Medicare HMO | Admitting: Family Medicine

## 2020-09-07 ENCOUNTER — Telehealth: Payer: Self-pay | Admitting: *Deleted

## 2020-09-07 NOTE — Chronic Care Management (AMB) (Signed)
  Care Management   Note  09/07/2020 Name: Hannah Young MRN: [id number] DOB: February 25, 1966  Hannah Young is a 55 y.o. year old female who is a primary care patient of As[place], Leanna Sato, MD and is actively engaged with the care management team. I reached out to [name] by phone today to assist with re-scheduling an initial visit with the RN Case Manager  Follow up plan: Telephone appointment with care management team member scheduled for:09/20/2020  [facility] Guide, Embedded Care Coordination [facility]  Care Management  Direct Dial: [phone]

## 2020-09-09 ENCOUNTER — Encounter (HOSPITAL_COMMUNITY): Payer: Self-pay

## 2020-09-09 ENCOUNTER — Ambulatory Visit (HOSPITAL_COMMUNITY)
Admission: EM | Admit: 2020-09-09 | Discharge: 2020-09-09 | Disposition: A | Discharge status: Home / Self Care | Payer: Medicare HMO | Attending: Family Medicine | Admitting: Family Medicine

## 2020-09-09 DIAGNOSIS — R059 Cough, unspecified: Secondary | ICD-10-CM | POA: Diagnosis not present

## 2020-09-09 DIAGNOSIS — J45909 Unspecified asthma, uncomplicated: Secondary | ICD-10-CM | POA: Insufficient documentation

## 2020-09-09 DIAGNOSIS — Z113 Encounter for screening for infections with a predominantly sexual mode of transmission: Secondary | ICD-10-CM

## 2020-09-09 DIAGNOSIS — R030 Elevated blood-pressure reading, without diagnosis of hypertension: Secondary | ICD-10-CM

## 2020-09-09 DIAGNOSIS — J069 Acute upper respiratory infection, unspecified: Secondary | ICD-10-CM

## 2020-09-09 DIAGNOSIS — Z7251 High risk heterosexual behavior: Secondary | ICD-10-CM | POA: Diagnosis not present

## 2020-09-09 DIAGNOSIS — I10 Essential (primary) hypertension: Secondary | ICD-10-CM | POA: Diagnosis not present

## 2020-09-09 DIAGNOSIS — U071 COVID-19: Secondary | ICD-10-CM | POA: Insufficient documentation

## 2020-09-09 DIAGNOSIS — F17210 Nicotine dependence, cigarettes, uncomplicated: Secondary | ICD-10-CM | POA: Insufficient documentation

## 2020-09-09 LAB — SARS CORONAVIRUS 2 (TAT 6-24 HRS): SARS Coronavirus 2: POSITIVE — AB

## 2020-09-09 LAB — HIV ANTIBODY (ROUTINE TESTING W REFLEX): HIV Screen 4th Generation wRfx: NONREACTIVE

## 2020-09-09 NOTE — ED Triage Notes (Signed)
Patient presents to Urgent Care with complaints of vaginal discomfort, productive cough and chest discomfort with coughing x 1.5 weeks. she is also here for STD testing.   Denies fever, n/v, diarrhea.

## 2020-09-09 NOTE — ED Provider Notes (Signed)
[place]    CSN: [id number] Arrival date & time: 09/09/20  0856      History   Chief Complaint Chief Complaint  Patient presents with  . Cough    HPI Hannah Young is a 55 y.o. female.   HPI   Patient states she had a cough for about a week.  Its been productive.  She states that she started to feel tired.  She would like to be tested for Covid. Patient states she also would like to be tested for STDs.  She has had unprotected sexual relations.  She is not having any vaginal symptoms, discharge, or abdominal pain Patient states she has not had the Covid vaccinations She has not had any known exposure to Covid She does have underlying lung disease, and states that she continues to smoke cigarettes  Past Medical History:  Diagnosis Date  . Anxiety   . Arthritis   . Asthma   . Bipolar 1 disorder ([place])   . Breast lump   . Cirrhosis ([place])   . Depression   . Diabetes mellitus   . Fall 09/02/2019  . FH: chemotherapy   . Gout   . Hepatitis B infection   . High cholesterol   . Hyperglycemia   . Hypertension   . Liver cirrhosis ([place])   . Lung nodules   . Neuropathy   . Panic disorder   . Vaginal discharge 01/25/2017  . Vertigo     Patient Active Problem List   Diagnosis Date Noted  . Chronic right shoulder pain 06/02/2020  . Cervical spondylosis without myelopathy 06/02/2020  . Bilateral occipital neuralgia 05/01/2020  . Chronic neck pain 05/01/2020  . Polyarthralgia 03/29/2020  . Biceps tendinitis of right shoulder 02/20/2020  . Osteoarthritis 02/12/2020  . Dysuria 02/12/2020  . Post concussion syndrome 09/02/2019  . Cough 07/26/2018  . Vaginal bleeding 07/26/2018  . STI (sexually transmitted infection) 07/26/2018  . Screening for cervical cancer 04/29/2018  . Healthcare maintenance 04/29/2018  . Unprotected sexual intercourse 03/26/2018  . Endometrial polyp 01/25/2018  . BV (bacterial vaginosis)   . Elevated LFTs 01/16/2018  . Primary  osteoarthritis of right knee 02/03/2017  . Multiple lung nodules on CT 01/25/2017  . Bilateral chronic knee pain 01/25/2017  . Depression 01/25/2017  . Autoimmune hepatitis ([place]) 01/30/2012  . Diabetes mellitus ([place]) 01/30/2012  . Gout 01/30/2012  . Asthma 01/30/2012  . Hypertension 01/30/2012    Past Surgical History:  Procedure Laterality Date  . Biopsy of liver    . CESAREAN SECTION    . LAPAROSCOPY    . TUBAL LIGATION      OB History    Gravida  6   Para  4   Term  0   Preterm  4   AB  2   Living  4     SAB  2   IAB  0   Ectopic      Multiple  0   Live Births  3            Home Medications    Prior to Admission medications   Medication Sig Start Date End Date Taking? Authorizing Provider  Accu-Chek Softclix Lancets lancets Use as instructed to check blood sugar 3x a day 07/16/20   Harvie Heck, MD  Blood Glucose Monitoring Suppl (ACCU-CHEK GUIDE ME) w/Device KIT Use 3 times per day to check your blood sugar. 07/16/20   Harvie Heck, MD  Capsaicin 0.025 % GEL Apply  4 application topically 3 (three) times daily. 12/01/19   Maudie Mercury, MD  diphenhydrAMINE (BENADRYL) 25 mg capsule Take 25 mg by mouth every 6 (six) hours as needed for itching.    [provider]  fluticasone (FLONASE) 50 MCG/ACT nasal spray Place 2 sprays into both nostrils daily as needed for allergies or rhinitis.     [provider]  glucose blood (ACCU-CHEK GUIDE) test strip Use as instructed to check blood sugar 3 times a day 07/16/20   Harvie Heck, MD  insulin aspart (NOVOLOG) 100 UNIT/ML injection Inject 5 Units into the skin 3 (three) times daily with meals. 01/27/20   Earlene Plater, MD  Insulin Pen Needle (ADVOCATE INSULIN PEN NEEDLES) 31G X 5 MM MISC 650 applicators by Does not apply route 4 (four) times daily. 01/18/18   Katherine Roan, MD  Insulin Syringe-Needle U-100 31G X 15/64" 0.3 ML MISC Use to inject insulin up to 4 times a day 07/16/20   Harvie Heck, MD  LEVEMIR 100 UNIT/ML injection Inject 0.15 mLs (15 Units total) into the skin at bedtime. Patient taking differently: Inject 15 Units into the skin at bedtime. 02/24/20   Mosetta Anis, MD  lisinopril (ZESTRIL) 10 MG tablet Take 1 tablet (10 mg total) by mouth daily. 01/29/20   Earlene Plater, MD  mercaptopurine (PURINETHOL) 50 MG tablet Take 50 mg by mouth daily. Give on an empty stomach 1 hour before or 2 hours after meals. Caution: Chemotherapy.    [provider]  omeprazole (PRILOSEC) 40 MG capsule Take 40 mg by mouth daily. 06/28/20   [provider]  predniSONE (DELTASONE) 20 MG tablet Take 40 mg by mouth daily with breakfast.    [provider]  triamcinolone (KENALOG) 0.1 % Apply 1 application topically 2 (two) times daily. Thin amount to face for rash/itching 07/05/20   Volney American, PA-C  VENTOLIN HFA 108 (90 Base) MCG/ACT inhaler INHALE ONE TO TWO puffs into THE lungs EVERY SIX HOURS AS NEEDED SHORTNESS OF BREATH Patient taking differently: Inhale 1-2 puffs into the lungs every 6 (six) hours as needed for wheezing or shortness of breath. 02/04/19   [name], MD    Family History Family History  Problem Relation Age of Onset  . Liver disease Mother   . Kidney failure Father   . Heart disease Sister   . Liver disease Daughter   . Anxiety disorder Daughter   . Heart disease Son   . Colon cancer Neg Hx   . Stomach cancer Neg Hx   . Migraines Neg Hx   . Headache Neg Hx     Social History Social History   Tobacco Use  . Smoking status: Current Every Day Smoker    Packs/day: 0.30    Types: Cigarettes    Last attempt to quit: 12/27/2017    Years since quitting: 2.7  . Smokeless tobacco: Never Used  . Tobacco comment: 2-3 cigs/day  Vaping Use  . Vaping Use: Never used  Substance Use Topics  . Alcohol use: Not Currently    Alcohol/week: 3.0 standard drinks    Types: 3 Cans of beer per week  . Drug use: No      Allergies   Amoxicillin, Augmentin [amoxicillin-pot clavulanate], Tylenol [acetaminophen], and Latex   Review of Systems Review of Systems See HPI  Physical Exam Triage Vital Signs ED Triage Vitals  Enc Vitals Group     BP 09/09/20 0900 (S) (!) 132/93  Pulse Rate 09/09/20 0900 80     Resp 09/09/20 0900 16     Temp 09/09/20 0900 98.8 F (37.1 C)     Temp src --      SpO2 09/09/20 0900 100 %     Weight --      Height --      Head Circumference --      Peak Flow --      Pain Score 09/09/20 0904 0     Pain Loc --      Pain Edu? --      Excl. in [place]? --    No data found.  Updated Vital Signs BP (S) (!) 132/93 (BP Location: Right Arm) Comment: Pt did not take BP meds.  Pulse 80   Temp 98.8 F (37.1 C)   Resp 16   SpO2 100%     Physical Exam Constitutional:      General: She is not in acute distress.    Appearance: Normal appearance. She is well-developed and well-nourished.     Comments: No acute distress.  Mask is in place  HENT:     Head: Normocephalic and atraumatic.     Mouth/Throat:     Mouth: Oropharynx is clear and moist.  Eyes:     Conjunctiva/sclera: Conjunctivae normal.     Pupils: Pupils are equal, round, and reactive to light.  Cardiovascular:     Rate and Rhythm: Normal rate.  Pulmonary:     Effort: Pulmonary effort is normal. No respiratory distress.     Comments: Heart and lung exam are normal Abdominal:     General: There is no distension.     Palpations: Abdomen is soft.  Musculoskeletal:        General: No edema. Normal range of motion.     Cervical back: Normal range of motion.  Skin:    General: Skin is warm and dry.  Neurological:     Mental Status: She is alert.  Psychiatric:        Behavior: Behavior normal.      UC Treatments / Results  Labs (all labs ordered are listed, but only abnormal results are displayed) Labs Reviewed  SARS CORONAVIRUS 2 (TAT 6-24 HRS)  RPR  HIV ANTIBODY (ROUTINE TESTING W REFLEX)   CERVICOVAGINAL ANCILLARY ONLY    EKG   Radiology No results found.  Procedures Procedures (including critical care time)  Medications Ordered in UC Medications - No data to display  Initial Impression / Assessment and Plan / UC Course  I have reviewed the triage vital signs and the nursing notes.  Pertinent labs & imaging results that were available during my care of the patient were reviewed by me and considered in my medical decision making (see chart for details).     Testing is done for STD patient patient request.  Coronavirus testing was done as well.  Vaccinations are recommended. Final Clinical Impressions(s) / UC Diagnoses   Final diagnoses:  Cough  Viral URI with cough  History of unprotected sex  Routine screening for STI (sexually transmitted infection)  Elevated blood pressure reading     Discharge Instructions     Check my chart for your test results Take the blood pressure medicines every day May take OTC mucinex DM for the cough Drink plenty of water Followup with your PCP   ED Prescriptions    None     PDMP not reviewed this encounter.   Raylene Everts, MD 09/09/20 [phone]

## 2020-09-09 NOTE — Discharge Instructions (Addendum)
Check my chart for your test results Take the blood pressure medicines every day May take OTC mucinex DM for the cough Drink plenty of water Followup with your PCP

## 2020-09-10 ENCOUNTER — Telehealth (HOSPITAL_COMMUNITY): Payer: Self-pay | Admitting: Emergency Medicine

## 2020-09-10 ENCOUNTER — Telehealth: Payer: Self-pay | Admitting: Physician Assistant

## 2020-09-10 LAB — CERVICOVAGINAL ANCILLARY ONLY
Bacterial Vaginitis (gardnerella): NEGATIVE
Candida Glabrata: NEGATIVE
Candida Vaginitis: NEGATIVE
Chlamydia: NEGATIVE
Comment: NEGATIVE
Comment: NEGATIVE
Comment: NEGATIVE
Comment: NEGATIVE
Comment: NEGATIVE
Comment: NORMAL
Neisseria Gonorrhea: NEGATIVE
Trichomonas: NEGATIVE

## 2020-09-10 LAB — RPR: RPR Ser Ql: NONREACTIVE

## 2020-09-10 MED ORDER — BENZONATATE 100 MG PO CAPS: 200.0000 mg | ORAL_CAPSULE | Freq: Three times a day (TID) | ORAL | 0 refills | Status: DC

## 2020-09-10 MED ORDER — IBUPROFEN 800 MG PO TABS: 800.0000 mg | ORAL_TABLET | Freq: Three times a day (TID) | ORAL | 0 refills | Status: DC

## 2020-09-10 NOTE — Telephone Encounter (Signed)
Called to discuss with patient about COVID-19 symptoms and the use of one of the available treatments for those with mild to moderate Covid symptoms and at a high risk of hospitalization.  Pt appears to qualify for outpatient treatment due to co-morbid conditions and/or a member of an at-risk group in accordance with the FDA Emergency Use Authorization.    Symptom onset: 7 days ago per ER notes Vaccinated: no Booster? no Immunocompromised? no Qualifiers: BMI > 25, Cardiovascular disease or hypertension, Chronic Lung Disease and Other high risk medical condition per CDC:  high SVI   Unable to reach pt - phone just rings and rings.   Cline Crock

## 2020-09-10 NOTE — Telephone Encounter (Signed)
Called to discuss with Hannah Young about Covid symptoms and the use of sotrovimab, remdisivir or oral therapies for those with mild to moderate Covid symptoms and at a high risk of hospitalization.     Pt does not qualify as pt's symptoms first presented > 10 days prior to timing of infusion. Symptoms tier reviewed as well as criteria for ending isolation. Preventative practices reviewed. Patient verbalized understanding  Patient Active Problem List   Diagnosis Date Noted  . Chronic right shoulder pain 06/02/2020  . Cervical spondylosis without myelopathy 06/02/2020  . Bilateral occipital neuralgia 05/01/2020  . Chronic neck pain 05/01/2020  . Polyarthralgia 03/29/2020  . Biceps tendinitis of right shoulder 02/20/2020  . Osteoarthritis 02/12/2020  . Dysuria 02/12/2020  . Post concussion syndrome 09/02/2019  . Cough 07/26/2018  . Vaginal bleeding 07/26/2018  . STI (sexually transmitted infection) 07/26/2018  . Screening for cervical cancer 04/29/2018  . Healthcare maintenance 04/29/2018  . Unprotected sexual intercourse 03/26/2018  . Endometrial polyp 01/25/2018  . BV (bacterial vaginosis)   . Elevated LFTs 01/16/2018  . Primary osteoarthritis of right knee 02/03/2017  . Multiple lung nodules on CT 01/25/2017  . Bilateral chronic knee pain 01/25/2017  . Depression 01/25/2017  . Autoimmune hepatitis ([facility]) 01/30/2012  . Diabetes mellitus ([facility]) 01/30/2012  . Gout 01/30/2012  . Asthma 01/30/2012  . Hypertension 01/30/2012    Cline Crock PA-C

## 2020-09-10 NOTE — Telephone Encounter (Signed)
Patient requested cough and pain medicine, verbals per Dr. Delton See

## 2020-09-13 ENCOUNTER — Telehealth: Payer: Self-pay

## 2020-09-13 NOTE — Telephone Encounter (Signed)
Unable to reach pt, want to informed pt disability parking placard is ready for pick up.

## 2020-09-20 ENCOUNTER — Ambulatory Visit: Payer: Medicare HMO | Admitting: *Deleted

## 2020-09-20 DIAGNOSIS — K754 Autoimmune hepatitis: Secondary | ICD-10-CM

## 2020-09-20 DIAGNOSIS — M159 Polyosteoarthritis, unspecified: Secondary | ICD-10-CM

## 2020-09-20 DIAGNOSIS — M8949 Other hypertrophic osteoarthropathy, multiple sites: Secondary | ICD-10-CM

## 2020-09-20 DIAGNOSIS — E119 Type 2 diabetes mellitus without complications: Secondary | ICD-10-CM

## 2020-09-20 DIAGNOSIS — M2550 Pain in unspecified joint: Secondary | ICD-10-CM

## 2020-09-20 DIAGNOSIS — Z794 Long term (current) use of insulin: Secondary | ICD-10-CM

## 2020-09-20 DIAGNOSIS — I10 Essential (primary) hypertension: Secondary | ICD-10-CM

## 2020-09-20 DIAGNOSIS — F0781 Postconcussional syndrome: Secondary | ICD-10-CM

## 2020-09-20 NOTE — Chronic Care Management (AMB) (Signed)
   09/20/2020  [name] 21-Apr-1966 [id number]  Reached patient via phone to complete initial assessment for enrollment into chronic care management program. When patient was advised the initial assessment can take up to one hour to complete;  she asked to reschedule the appointment to 09/24/20 at 9:00 am. Appointment rescheulded per patient request.   Cranford Mon RN, CCM, CDCES CCM Clinic RN Care Manager [phone]

## 2020-09-21 ENCOUNTER — Other Ambulatory Visit: Payer: Self-pay | Admitting: *Deleted

## 2020-09-21 DIAGNOSIS — Z794 Long term (current) use of insulin: Secondary | ICD-10-CM

## 2020-09-21 DIAGNOSIS — E118 Type 2 diabetes mellitus with unspecified complications: Secondary | ICD-10-CM

## 2020-09-21 MED ORDER — INSULIN ASPART 100 UNIT/ML ~~LOC~~ SOLN
SUBCUTANEOUS | 1 refills | Status: DC
Start: 2020-09-21 — End: 2021-09-29

## 2020-09-24 ENCOUNTER — Ambulatory Visit: Payer: Medicare HMO | Admitting: *Deleted

## 2020-09-24 ENCOUNTER — Telehealth: Payer: Self-pay | Admitting: *Deleted

## 2020-09-24 DIAGNOSIS — I10 Essential (primary) hypertension: Secondary | ICD-10-CM

## 2020-09-24 DIAGNOSIS — M159 Polyosteoarthritis, unspecified: Secondary | ICD-10-CM

## 2020-09-24 DIAGNOSIS — E119 Type 2 diabetes mellitus without complications: Secondary | ICD-10-CM

## 2020-09-24 DIAGNOSIS — M8949 Other hypertrophic osteoarthropathy, multiple sites: Secondary | ICD-10-CM

## 2020-09-24 DIAGNOSIS — Z794 Long term (current) use of insulin: Secondary | ICD-10-CM

## 2020-09-24 NOTE — Telephone Encounter (Signed)
  Chronic Care Management   Note  09/24/2020 Name: JHADA RISK MRN: [id number] DOB: 1966-07-01   Unsuccessful outreach to patient to complete initial assessment. NO answer at contact number and no option to leave voice mail.   Follow up plan: No further follow up required: closing to CCM as unable to contact patient to complete initial assessment despite multiple attempts.  Cranford Mon RN, CCM, CDCES CCM Clinic RN Care Manager [phone]

## 2020-09-24 NOTE — Progress Notes (Signed)
Internal Medicine Clinic Resident  I have personally reviewed this encounter including the documentation in this note and/or discussed this patient with the care management provider. Agree with plan to close referral.  Ames Dura, MD 09/24/2020

## 2020-09-24 NOTE — Progress Notes (Signed)
Encounter reviewed, case referral closure noted due to inability to contact patient.

## 2020-09-24 NOTE — Chronic Care Management (AMB) (Signed)
  Chronic Care Management   Outreach Note  09/24/2020 Name: Hannah Young MRN: [id number] DOB: 11-Apr-1966  Referred by: Eliezer Bottom, MD Reason for referral : Chronic Care Management (IDDM, HTN, ASthma, OA, depression and bipolar, chronic pain in neck, back and  knees, polyarthralgia, Covid + 09/09/20, post concussion syndrom)   Third unsuccessful telephone outreach was attempted today. The patient was referred to the case management team for assistance with care management and care coordination. The patient's primary care provider has been notified of our unsuccessful attempts to make or maintain contact with the patient. The care management team is pleased to engage with this patient at any time in the future should he/she be interested in assistance from the care management team.     Follow Up Plan: No further follow up required: closing to CCM servcies as unable to reach to complete initial assessment.   Cranford Mon RN, CCM, CDCES CCM Clinic RN Care Manager [phone]

## 2020-10-12 ENCOUNTER — Telehealth: Payer: Self-pay

## 2020-10-12 NOTE — Telephone Encounter (Signed)
Called pt to schedule an appt per Dr Nedra Hai - she's unable to talk at this time (she has to talk to her grand son's teacher) but stated she will call back.

## 2020-10-12 NOTE — Telephone Encounter (Signed)
Stacee, Per our workflow, I closed this patient to CCM services due to unable to maintain contact. CCM will need will need another referral from provider. Marylu Lund

## 2020-10-12 NOTE — Telephone Encounter (Signed)
I agree that she needs an appointment prior to referral to ensure she is directed to the appropriate resource. No referral until appointment thank you.

## 2020-10-12 NOTE — Telephone Encounter (Signed)
RTC, patient states "What's wrong with me?  I know I am dying.  The nurse has been trying to get in touch with me for a while.  Can you tell me what's wrong with my liver"?  Per chart review, JHauser, CCM has tried several times, unsuccessfully, to reach patient and CCM closed case.  Pt informed and states she doesn't answer her phone.  Pt states she was sent for a Liver Bx at [facility], but has "blown off follow up appt's because she knows she's dying" (see telephone note from 07/02/20).  She is asking if CCM can reach out to her again.  Pt encouraged to call [facility] regarding testing and f/u, she states she will think about it.  Pt encouraged to make an appt with yellow team, both in-person and virtual offered, she declined.  Will forward to CCM and yellow team. Penne Lash, RN,BSN

## 2020-10-12 NOTE — Telephone Encounter (Signed)
Pls contact pt [phone]

## 2020-10-13 NOTE — Telephone Encounter (Signed)
Called pt to schedule an appt . Pt wants to come Wed of next week - appt schedule Wed 3/9 @ 0845 AM with Dr Marijo Conception.

## 2020-10-13 NOTE — Telephone Encounter (Signed)
Thank you Dr. Nedra Hai for your direction on this. Marylu Lund

## 2020-10-20 ENCOUNTER — Other Ambulatory Visit: Payer: Self-pay

## 2020-10-20 ENCOUNTER — Ambulatory Visit (INDEPENDENT_AMBULATORY_CARE_PROVIDER_SITE_OTHER): Payer: Medicare HMO | Admitting: Student

## 2020-10-20 ENCOUNTER — Encounter: Payer: Self-pay | Admitting: Student

## 2020-10-20 VITALS — BP 115/93 | HR 88 | Temp 98.6°F | Ht 61.0 in | Wt 184.6 lb

## 2020-10-20 DIAGNOSIS — K754 Autoimmune hepatitis: Secondary | ICD-10-CM | POA: Diagnosis not present

## 2020-10-20 DIAGNOSIS — R519 Headache, unspecified: Secondary | ICD-10-CM

## 2020-10-20 DIAGNOSIS — Z794 Long term (current) use of insulin: Secondary | ICD-10-CM

## 2020-10-20 DIAGNOSIS — F319 Bipolar disorder, unspecified: Secondary | ICD-10-CM | POA: Diagnosis not present

## 2020-10-20 DIAGNOSIS — L659 Nonscarring hair loss, unspecified: Secondary | ICD-10-CM | POA: Diagnosis not present

## 2020-10-20 DIAGNOSIS — J4520 Mild intermittent asthma, uncomplicated: Secondary | ICD-10-CM

## 2020-10-20 DIAGNOSIS — E119 Type 2 diabetes mellitus without complications: Secondary | ICD-10-CM | POA: Diagnosis not present

## 2020-10-20 DIAGNOSIS — I10 Essential (primary) hypertension: Secondary | ICD-10-CM | POA: Diagnosis not present

## 2020-10-20 DIAGNOSIS — K7460 Unspecified cirrhosis of liver: Secondary | ICD-10-CM | POA: Diagnosis not present

## 2020-10-20 LAB — POCT GLYCOSYLATED HEMOGLOBIN (HGB A1C): Hemoglobin A1C: 9.2 % — AB (ref 4.0–5.6)

## 2020-10-20 LAB — GLUCOSE, CAPILLARY: Glucose-Capillary: 213 mg/dL — ABNORMAL HIGH (ref 70–99)

## 2020-10-20 NOTE — Progress Notes (Signed)
   CC: hair loss, diabetes  HPI:  Hannah Young is a 55 y.o. with autoimmune hepatitis, type II diabetes mellitus, hypertension, hyperlipidemia presenting to [facility] for diabetes follow-up and hair loss.  Please see problem-based list for further details, assessments, and plans.   Past Medical History:  Diagnosis Date  . Anxiety   . Arthritis   . Asthma   . Autoimmune hepatitis ([facility])   . Bipolar 1 disorder ([facility])   . Breast lump   . Depression   . Diabetes mellitus   . FH: chemotherapy   . Gout   . High cholesterol   . Hypertension   . Liver cirrhosis ([facility])   . Lung nodules   . Neuropathy   . Panic disorder   . Vaginal discharge 01/25/2017  . Vertigo    Review of Systems:  As per HPI  Physical Exam:  Vitals:   10/20/20 0836  BP: (!) 115/93  Pulse: 88  Temp: 98.6 F (37 C)  TempSrc: Oral  SpO2: 99%  Weight: 184 lb 9.6 oz (83.7 kg)  Height: 5\' 1"  (1.549 m)   General: Pleasant, sitting in chair comfortably, no acute distress CV: Regular rate, rhythm. No m/r/g appreciated Pulm: Clear to auscultation bilaterally. No wheezing, rales, rhonchi. GI: Soft, non-tender, non-distended. No hepatomegaly appreciated. Negative fluid wave. Normoactive bowel sounds. Neuro: Awake, alert, oriented x4. No focal deficits.   Assessment & Plan:   See Encounters Tab for problem based charting.  Patient discussed with Dr. 

## 2020-10-20 NOTE — Patient Instructions (Signed)
Hannah Young,  It was a pleasure seeing you today!  Today we discussed your liver biopsy results, diabetes, and hair loss.  Please make sure to make an appointment with Hannah Young to see your liver doctor.  We are checking an A1c today. Depending on the results, we might switch some of your medications. I will call you tomorrow with the results.  For your hair loss, we are checking you thyroid. I will call you tomorrow with the results.  We look forward to seeing you next time. Please call our clinic at [phone] if you have any questions or concerns. The best time to call is Monday-Friday from 9am-4pm, but there is someone available 24/7 at the same number. If you need medication refills, please notify your pharmacy one week in advance and they will send [place] a request.  Thank you for letting us take part in your care. Wishing you the best!  Thank you, Dr. Evlyn Kanner, MD

## 2020-10-21 ENCOUNTER — Telehealth: Payer: Self-pay

## 2020-10-21 DIAGNOSIS — L659 Nonscarring hair loss, unspecified: Secondary | ICD-10-CM

## 2020-10-21 HISTORY — DX: Nonscarring hair loss, unspecified: L65.9

## 2020-10-21 LAB — TSH: TSH: 1.37 u[IU]/mL (ref 0.450–4.500)

## 2020-10-21 MED ORDER — SOLIQUA 100-33 UNT-MCG/ML ~~LOC~~ SOPN: 15.0000 [IU] | PEN_INJECTOR | Freq: Every day | SUBCUTANEOUS | 0 refills | Status: DC

## 2020-10-21 MED ORDER — NAPROXEN 500 MG PO TABS: 500.0000 mg | ORAL_TABLET | Freq: Two times a day (BID) | ORAL | 0 refills | Status: AC

## 2020-10-21 MED ORDER — SOLIQUA 100-33 UNT-MCG/ML ~~LOC~~ SOPN: 15.0000 [IU] | PEN_INJECTOR | Freq: Every day | SUBCUTANEOUS | 2 refills | Status: DC

## 2020-10-21 MED ORDER — ALBUTEROL SULFATE HFA 108 (90 BASE) MCG/ACT IN AERS: INHALATION_SPRAY | RESPIRATORY_TRACT | 12 refills | Status: DC

## 2020-10-21 NOTE — Telephone Encounter (Signed)
Hannah [facility] with [facility] requesting to speak with a nurse about  Insulin Glargine-Lixisenatide (SOLIQUA) 100-33 UNT-MCG/ML SOPN. Please call back.

## 2020-10-21 NOTE — Assessment & Plan Note (Addendum)
Patient reports compliance with her insulin. Discussed that she will need tighter glycemic control for surgery on her knees. She has not seen the eye doctor in "awhile." She does check her sugars daily, mentioning they are often elevated, sometimes where the glucometer will not result in a reading. Denies changes in chronic polyuria, decreased sensation in extremities, or neuropathic pain.   A/P: Will follow-up A1c today. Possible that patient could benefit from GLP-1 for diabetes and weight loss. Will follow-up and discuss with patient. - A1c today - Consider switching to GLP-1 pending A1c - Ophthalmology referral - F/u A1c in 3 mo  ADDENDUM: A1c 9.2. Discussed with patient need for better glycemic control given possible surgery for her knees. Will switch to [place] at this time. Explained to patient that she will need to stop her insulin tomorrow and start taking Soliqua int he morning about an hour prior to her first meal. Explained this is to be taken once daily. Also explained that she should inject 15 units daily and can increase her dose by 2 units every 2 weeks if sugars remain above 200. Discussed that goal blood sugars are 80-120. She expressed understanding, no further questions at this time. She will need close follow-up to ensure compliance and understanding. - Stop insulin - Start Soliqua 15u daily

## 2020-10-21 NOTE — Addendum Note (Signed)
Addended byEvlyn Kanner on: 10/21/2020 11:16 AM   Modules accepted: Orders

## 2020-10-21 NOTE — Progress Notes (Signed)
Internal Medicine Clinic Attending ? ?Case discussed with Dr. Braswell  At the time of the visit.  We reviewed the resident?s history and exam and pertinent patient test results.  I agree with the assessment, diagnosis, and plan of care documented in the resident?s note.  ?

## 2020-10-21 NOTE — Assessment & Plan Note (Signed)
BP Readings from Last 3 Encounters:  10/20/20 (!) 115/93  09/09/20 (S) (!) 132/93  07/23/20 112/76   BP at goal, will continue with current regimen. Denies chest pain, dyspnea, leg swelling. - Continue lisinopril 10mg  qd.

## 2020-10-21 NOTE — Telephone Encounter (Signed)
Returned call to [facility] and spoke with pharmacist, Doristine Mango.  He states the [employer] which was sent yesterday was sent to "dispense 3 ml, but comes in 69ml"  Asking if the RX can be resent.  Sending to Dr. Marijo Conception. Thanks, SChaplin, RN,BSN

## 2020-10-21 NOTE — Addendum Note (Signed)
Addended byEvlyn Kanner on: 10/21/2020 12:43 PM   Modules accepted: Orders

## 2020-10-21 NOTE — Telephone Encounter (Signed)
Just sent the rx in! Thanks Stacee!

## 2020-10-21 NOTE — Assessment & Plan Note (Addendum)
Patient is seen by [facility] and [facility] for her autoimmune hepatitis. Records were obtained for further characterization of patient's condition. Please see "Overview" for further details.   Of note, she states she is currently not interested in liver transplant. When asked why, she says she doesn't want to take someone's organs. Encouraged patient to continue discuss this with her GI physician. Tolerating prednisone and mercaptopurine without issues. - C/w prednisone, mercaptopurine per GI - F/u with GI at [facility]

## 2020-10-21 NOTE — Assessment & Plan Note (Addendum)
Patient says she has experienced progressive hair loss within the last four months. Mentions she has a sister with similar hair loss, but no other family member has similar issue. Denies having tight braids in her hair. Denies thinning of hair, skin changes, heat or cold intolerance.  A/P: Possibly age-related hair loss vs hypothyroidism. Will check TSH today. On exam, thyroid without nodules or tenderness. - TSH today  ADDENDUM: TSH normal. Discussed with patient that hair loss possibly 2/2 her chemo drug. Patient quite adamant that "chemo has nothing to do with it," although her chemo pill was recently started back. She has not discussed this with her GI physician, although I encouraged her to do so. Patient also describing headaches that occur daily, describing it as "feeling as though something is moving throughout my head," including behind her eyes. She is requesting CT scan, she feels this is related to her hair loss. Discussed with patient she is not showing any focal neurological signs or symptoms consistent with structural brain disease, and CT scan would be inappropriate at this time. Explained hair loss still could be 2/2 chemo pill or age-related hair loss. She is requesting medication for headache. Will send in 10 days of naproxen. Encouraged patient to return to clinic for further evaluation if headaches continue. - Naproxen 500mg  BID PRN for headaches

## 2020-10-29 ENCOUNTER — Ambulatory Visit: Payer: Medicare HMO | Admitting: Family Medicine

## 2020-10-29 LAB — PROTIME-INR

## 2020-11-01 ENCOUNTER — Telehealth: Payer: Self-pay

## 2020-11-03 ENCOUNTER — Ambulatory Visit: Payer: Medicare HMO | Admitting: Orthopaedic Surgery

## 2020-11-05 ENCOUNTER — Ambulatory Visit (HOSPITAL_COMMUNITY)
Admission: RE | Admit: 2020-11-05 | Discharge status: Absent without leave | Payer: Medicare HMO | Source: Ambulatory Visit | Attending: Internal Medicine | Admitting: Internal Medicine

## 2020-11-09 ENCOUNTER — Ambulatory Visit: Payer: Medicare HMO | Admitting: Orthopaedic Surgery

## 2020-11-10 ENCOUNTER — Ambulatory Visit: Payer: Medicare HMO | Admitting: Family Medicine

## 2020-11-11 ENCOUNTER — Ambulatory Visit (HOSPITAL_COMMUNITY)
Admission: EM | Admit: 2020-11-11 | Discharge: 2020-11-11 | Disposition: A | Discharge status: Home / Self Care | Payer: Medicare HMO | Attending: Family Medicine | Admitting: Family Medicine

## 2020-11-11 ENCOUNTER — Other Ambulatory Visit: Payer: Self-pay

## 2020-11-11 ENCOUNTER — Encounter (HOSPITAL_COMMUNITY): Payer: Self-pay

## 2020-11-11 DIAGNOSIS — N760 Acute vaginitis: Secondary | ICD-10-CM | POA: Diagnosis not present

## 2020-11-11 DIAGNOSIS — Z880 Allergy status to penicillin: Secondary | ICD-10-CM | POA: Insufficient documentation

## 2020-11-11 DIAGNOSIS — Z87891 Personal history of nicotine dependence: Secondary | ICD-10-CM | POA: Insufficient documentation

## 2020-11-11 DIAGNOSIS — Z881 Allergy status to other antibiotic agents status: Secondary | ICD-10-CM | POA: Diagnosis not present

## 2020-11-11 DIAGNOSIS — Z711 Person with feared health complaint in whom no diagnosis is made: Secondary | ICD-10-CM | POA: Diagnosis not present

## 2020-11-11 DIAGNOSIS — N898 Other specified noninflammatory disorders of vagina: Secondary | ICD-10-CM | POA: Diagnosis present

## 2020-11-11 DIAGNOSIS — Z113 Encounter for screening for infections with a predominantly sexual mode of transmission: Secondary | ICD-10-CM | POA: Insufficient documentation

## 2020-11-11 DIAGNOSIS — Z886 Allergy status to analgesic agent status: Secondary | ICD-10-CM | POA: Diagnosis not present

## 2020-11-11 LAB — HIV ANTIBODY (ROUTINE TESTING W REFLEX): HIV Screen 4th Generation wRfx: NONREACTIVE

## 2020-11-11 NOTE — ED Triage Notes (Signed)
Pt c/o vaginal irritation and foul vaginal odor X 2 weeks. Pt states she has been having vaginal discharge.

## 2020-11-11 NOTE — ED Provider Notes (Signed)
[place]    CSN: [id number] Arrival date & time: 11/11/20  8891      History   Chief Complaint Chief Complaint  Patient presents with  . Vaginal Discharge  . Vaginal Itching    HPI Hannah Young is a 55 y.o. female.   Presenting today with 2-week history of vaginal discharge, odor, itching and irritation.  She states that she has a latex allergy and also a history of BV, has been using latex condoms recently aware that she needs to find a latex free brand.  She is not tried anything over-the-counter for symptoms.  Requesting full panel of STD screening today.  Denies nausea, vomiting, dysuria, hematuria, fever, chills, flank pain.     Past Medical History:  Diagnosis Date  . Anxiety   . Arthritis   . Asthma   . Autoimmune hepatitis ([place])   . Bipolar 1 disorder ([place])   . Breast lump   . Depression   . Diabetes mellitus   . FH: chemotherapy   . Gout   . High cholesterol   . Hypertension   . Liver cirrhosis ([place])   . Lung nodules   . Neuropathy   . Panic disorder   . Vaginal discharge 01/25/2017  . Vertigo     Patient Active Problem List   Diagnosis Date Noted  . Hair loss 10/21/2020  . Chronic right shoulder pain 06/02/2020  . Cervical spondylosis without myelopathy 06/02/2020  . Bilateral occipital neuralgia 05/01/2020  . Chronic neck pain 05/01/2020  . Polyarthralgia 03/29/2020  . Biceps tendinitis of right shoulder 02/20/2020  . Osteoarthritis 02/12/2020  . Dysuria 02/12/2020  . Post concussion syndrome 09/02/2019  . Cough 07/26/2018  . Vaginal bleeding 07/26/2018  . STI (sexually transmitted infection) 07/26/2018  . Screening for cervical cancer 04/29/2018  . Healthcare maintenance 04/29/2018  . Unprotected sexual intercourse 03/26/2018  . Endometrial polyp 01/25/2018  . BV (bacterial vaginosis)   . Elevated LFTs 01/16/2018  . Primary osteoarthritis of right knee 02/03/2017  . Multiple lung nodules on CT 01/25/2017  . Bilateral  chronic knee pain 01/25/2017  . Depression 01/25/2017  . Autoimmune hepatitis ([place]) 01/30/2012  . Diabetes mellitus ([place]) 01/30/2012  . Gout 01/30/2012  . Asthma 01/30/2012  . Hypertension 01/30/2012    Past Surgical History:  Procedure Laterality Date  . Biopsy of liver    . CESAREAN SECTION    . LAPAROSCOPY    . TUBAL LIGATION      OB History    Gravida  6   Para  4   Term  0   Preterm  4   AB  2   Living  4     SAB  2   IAB  0   Ectopic      Multiple  0   Live Births  3            Home Medications    Prior to Admission medications   Medication Sig Start Date End Date Taking? Authorizing Provider  Accu-Chek Softclix Lancets lancets Use as instructed to check blood sugar 3x a day 07/16/20   Aslam, Loralyn Freshwater, MD  albuterol (VENTOLIN HFA) 108 (90 Base) MCG/ACT inhaler INHALE ONE TO TWO puffs into THE lungs EVERY SIX HOURS AS NEEDED SHORTNESS OF BREATH 10/21/20   Sanjuan Dame, MD  benzonatate (TESSALON) 100 MG capsule Take 2 capsules (200 mg total) by mouth 3 (three) times daily. 09/10/20   Raylene Everts, MD  Blood Glucose  Monitoring Suppl (ACCU-CHEK GUIDE ME) w/Device KIT Use 3 times per day to check your blood sugar. 07/16/20   Harvie Heck, MD  Capsaicin 0.025 % GEL Apply 4 application topically 3 (three) times daily. 12/01/19   Maudie Mercury, MD  diphenhydrAMINE (BENADRYL) 25 mg capsule Take 25 mg by mouth every 6 (six) hours as needed for itching.    [provider]  fluticasone (FLONASE) 50 MCG/ACT nasal spray Place 2 sprays into both nostrils daily as needed for allergies or rhinitis.     [provider]  glucose blood (ACCU-CHEK GUIDE) test strip Use as instructed to check blood sugar 3 times a day 07/16/20   Harvie Heck, MD  insulin aspart (NOVOLOG) 100 UNIT/ML injection Inject 5 Units into the skin 3 (three) times daily with meals. 09/21/20   Jose Persia, MD  Insulin Glargine-Lixisenatide (SOLIQUA) 100-33 UNT-MCG/ML SOPN  Inject 15 Units into the skin daily. 10/21/20   Sanjuan Dame, MD  lisinopril (ZESTRIL) 10 MG tablet Take 1 tablet (10 mg total) by mouth daily. 01/29/20   Earlene Plater, MD  mercaptopurine (PURINETHOL) 50 MG tablet Take 50 mg by mouth daily. Give on an empty stomach 1 hour before or 2 hours after meals. Caution: Chemotherapy.    [provider]  omeprazole (PRILOSEC) 40 MG capsule Take 40 mg by mouth daily. 06/28/20   [provider]  predniSONE (DELTASONE) 20 MG tablet Take 40 mg by mouth daily with breakfast.    [provider]  triamcinolone (KENALOG) 0.1 % Apply 1 application topically 2 (two) times daily. Thin amount to face for rash/itching 07/05/20   Volney American, PA-C    Family History Family History  Problem Relation Age of Onset  . Liver disease Mother   . Kidney failure Father   . Heart disease Sister   . Liver disease Daughter   . Anxiety disorder Daughter   . Heart disease Son   . Colon cancer Neg Hx   . Stomach cancer Neg Hx   . Migraines Neg Hx   . Headache Neg Hx     Social History Social History   Tobacco Use  . Smoking status: Former Smoker    Packs/day: 0.30    Types: Cigarettes    Quit date: 08/22/2020    Years since quitting: 0.2  . Smokeless tobacco: Never Used  . Tobacco comment: 2-3 cigs/day  Vaping Use  . Vaping Use: Never used  Substance Use Topics  . Alcohol use: Not Currently    Alcohol/week: 3.0 standard drinks    Types: 3 Cans of beer per week  . Drug use: No     Allergies   Amoxicillin, Augmentin [amoxicillin-pot clavulanate], Tylenol [acetaminophen], and Latex   Review of Systems Review of Systems Per HPI Physical Exam Triage Vital Signs ED Triage Vitals  Enc Vitals Group     BP 11/11/20 0841 115/84     Pulse Rate 11/11/20 0841 96     Resp 11/11/20 0841 18     Temp 11/11/20 0841 98 F (36.7 C)     Temp Source 11/11/20 0841 Oral     SpO2 11/11/20 0841 98 %     Weight --      Height  --      Head Circumference --      Peak Flow --      Pain Score 11/11/20 0840 0     Pain Loc --      Pain Edu? --  Excl. in GC? --    No data found.  Updated Vital Signs BP 115/84 (BP Location: Right Arm)   Pulse 96   Temp 98 F (36.7 C) (Oral)   Resp 18   SpO2 98%   Visual Acuity Right Eye Distance:   Left Eye Distance:   Bilateral Distance:    Right Eye Near:   Left Eye Near:    Bilateral Near:     Physical Exam Vitals and nursing note reviewed.  Constitutional:      Appearance: Normal appearance. She is not ill-appearing.  HENT:     Head: Atraumatic.     Mouth/Throat:     Mouth: Mucous membranes are moist.     Pharynx: Oropharynx is clear.  Eyes:     Extraocular Movements: Extraocular movements intact.     Conjunctiva/sclera: Conjunctivae normal.  Cardiovascular:     Rate and Rhythm: Normal rate and regular rhythm.     Heart sounds: Normal heart sounds.  Pulmonary:     Effort: Pulmonary effort is normal.     Breath sounds: Normal breath sounds.  Abdominal:     General: Bowel sounds are normal. There is no distension.     Palpations: Abdomen is soft.     Tenderness: There is no abdominal tenderness. There is no right CVA tenderness, left CVA tenderness or guarding.  Genitourinary:    Comments: GU exam deferred, self swab performed Musculoskeletal:        General: Normal range of motion.     Cervical back: Normal range of motion and neck supple.  Skin:    General: Skin is warm and dry.  Neurological:     Mental Status: She is alert and oriented to person, place, and time.  Psychiatric:        Mood and Affect: Mood normal.        Thought Content: Thought content normal.        Judgment: Judgment normal.      UC Treatments / Results  Labs (all labs ordered are listed, but only abnormal results are displayed) Labs Reviewed  HIV ANTIBODY (ROUTINE TESTING W REFLEX)  RPR  CERVICOVAGINAL ANCILLARY ONLY    EKG   Radiology No results  found.  Procedures Procedures (including critical care time)  Medications Ordered in UC Medications - No data to display  Initial Impression / Assessment and Plan / UC Course  I have reviewed the triage vital signs and the nursing notes.  Pertinent labs & imaging results that were available during my care of the patient were reviewed by me and considered in my medical decision making (see chart for details).     Vaginal swab, syphilis and HIV testing all pending.  Will treat based on these results.  Find a latex free brand of condoms to use as needed.  Safe sexual practices and good vaginal hygiene practices reviewed.  Return for acutely worsening symptoms.  Final Clinical Impressions(s) / UC Diagnoses   Final diagnoses:  Acute vaginitis  Concern about STD in female without diagnosis   Discharge Instructions   None    ED Prescriptions    None     PDMP not reviewed this encounter.   Merrie Roof [place], [place] 11/11/20 [phone]

## 2020-11-12 LAB — CERVICOVAGINAL ANCILLARY ONLY
Bacterial Vaginitis (gardnerella): NEGATIVE
Candida Glabrata: NEGATIVE
Candida Vaginitis: NEGATIVE
Chlamydia: NEGATIVE
Comment: NEGATIVE
Comment: NEGATIVE
Comment: NEGATIVE
Comment: NEGATIVE
Comment: NEGATIVE
Comment: NORMAL
Neisseria Gonorrhea: NEGATIVE
Trichomonas: NEGATIVE

## 2020-11-12 LAB — RPR: RPR Ser Ql: NONREACTIVE

## 2020-11-16 ENCOUNTER — Telehealth: Payer: Self-pay

## 2020-11-16 NOTE — Telephone Encounter (Signed)
Return call to pt who stated she's planning to move into a new apartment; she's unable to climb steps so needs a letter stating this. Address "To whom it may concern" Thanks

## 2020-11-16 NOTE — Telephone Encounter (Signed)
Requesting a letter stating she can not climb the steps. Please call pt back.

## 2020-11-17 ENCOUNTER — Encounter: Payer: Self-pay | Admitting: Internal Medicine

## 2020-11-17 NOTE — Telephone Encounter (Signed)
Appears that patient has history of degenerative joint disease on imaging from 2021. She is being evaluated for possible knee surgery. Letter is ready under communications.

## 2020-11-17 NOTE — Telephone Encounter (Signed)
Pt was called / informed letter ready for pick up.

## 2020-11-23 DIAGNOSIS — Z1152 Encounter for screening for COVID-19: Secondary | ICD-10-CM | POA: Diagnosis not present

## 2020-11-24 ENCOUNTER — Telehealth: Payer: Self-pay

## 2020-11-24 NOTE — Telephone Encounter (Signed)
Return pt's call - no answer; constance ringing-unable to leave a message.

## 2020-11-24 NOTE — Telephone Encounter (Signed)
Pt is requesting a call back ... pt stated she needs to speak to the nurse about the paper  That is to be filled out about what is wrong with her but  She is requesting a specific part to be left off

## 2020-12-02 ENCOUNTER — Telehealth: Payer: Self-pay

## 2020-12-02 ENCOUNTER — Ambulatory Visit (HOSPITAL_COMMUNITY): Payer: Medicare HMO

## 2020-12-02 NOTE — Telephone Encounter (Signed)
Pt is requesting a call back she is in need of a letter  Stating what she is being treated for from our clinic

## 2020-12-02 NOTE — Telephone Encounter (Signed)
I'll leave the letter at front. Also available under communications

## 2020-12-02 NOTE — Telephone Encounter (Signed)
Patient called stating that she needs her current office notes.  Stated that she will pick them up.  Cb# [phone].  Please advise.  Thank you.

## 2020-12-02 NOTE — Telephone Encounter (Signed)
IC,spoke with patient. She needs all her records. She will going to another office to pickup 4/27 and would to pick up at time. She is disabled. I advised will have records ready and can sign release at time she picks up.

## 2020-12-02 NOTE — Telephone Encounter (Signed)
Patient notified letter will be at front desk to pick up. SChaplin, RN,BSN

## 2020-12-02 NOTE — Telephone Encounter (Signed)
RTC, Patient is requesting a letter which states what she is being treated for and which medications are being prescribed to treat her conditions.  States the letter can be addressed "To Whom It May Concern".  She states it is for her attorney and she would like to pick this up next week.  SChaplin, RN,BSN

## 2020-12-02 NOTE — Telephone Encounter (Signed)
Can you please help with this ?

## 2020-12-07 ENCOUNTER — Telehealth: Payer: Self-pay

## 2020-12-07 NOTE — Telephone Encounter (Signed)
Return call to pt who stated passing blood thru her rectum x 4 days. Stated it has been light until today which is heavier. Stated she has mold throughout her house; having "bad headaches". And feces in her basement d/t bad pipes. Stated she "felled out" twice; I asked if she fainted; she stated she loss her balance.C/o dizziness, hair loss.  Continues to talk about the mold and how bad she feels. Stated she needs to be admitted to the hospital so they can run tests. Offered pt an appt this week, stated she needs to be admitted - informed she needs to be evaluated. Informed if she does not want an appt, she needs to go to the ED. Stated she prefers to go to the ED. Stated she will get her daughter to take her; informed to go today.

## 2020-12-07 NOTE — Telephone Encounter (Signed)
Thank you for the update. Agree that she probably needs more urgent evaluation if she is having heavy bleeding from her rectum.

## 2020-12-07 NOTE — Telephone Encounter (Signed)
Requesting to speak with a nurse about passing blood in the stool. Please call pt back.

## 2020-12-13 ENCOUNTER — Emergency Department (HOSPITAL_COMMUNITY): Payer: Medicare HMO

## 2020-12-13 ENCOUNTER — Encounter (HOSPITAL_COMMUNITY): Payer: Self-pay | Admitting: Pharmacy Technician

## 2020-12-13 ENCOUNTER — Other Ambulatory Visit: Payer: Self-pay

## 2020-12-13 ENCOUNTER — Emergency Department (HOSPITAL_COMMUNITY)
Admission: EM | Admit: 2020-12-13 | Discharge: 2020-12-13 | Disposition: A | Discharge status: Home / Self Care | Payer: Medicare HMO | Attending: Emergency Medicine | Admitting: Emergency Medicine

## 2020-12-13 DIAGNOSIS — Z794 Long term (current) use of insulin: Secondary | ICD-10-CM | POA: Insufficient documentation

## 2020-12-13 DIAGNOSIS — Z87891 Personal history of nicotine dependence: Secondary | ICD-10-CM | POA: Insufficient documentation

## 2020-12-13 DIAGNOSIS — L292 Pruritus vulvae: Secondary | ICD-10-CM | POA: Diagnosis not present

## 2020-12-13 DIAGNOSIS — I10 Essential (primary) hypertension: Secondary | ICD-10-CM | POA: Diagnosis not present

## 2020-12-13 DIAGNOSIS — J45909 Unspecified asthma, uncomplicated: Secondary | ICD-10-CM | POA: Diagnosis not present

## 2020-12-13 DIAGNOSIS — E1140 Type 2 diabetes mellitus with diabetic neuropathy, unspecified: Secondary | ICD-10-CM | POA: Diagnosis not present

## 2020-12-13 DIAGNOSIS — N898 Other specified noninflammatory disorders of vagina: Secondary | ICD-10-CM

## 2020-12-13 DIAGNOSIS — Z91040 Latex allergy status: Secondary | ICD-10-CM | POA: Diagnosis not present

## 2020-12-13 DIAGNOSIS — Z79899 Other long term (current) drug therapy: Secondary | ICD-10-CM | POA: Diagnosis not present

## 2020-12-13 DIAGNOSIS — R55 Syncope and collapse: Secondary | ICD-10-CM | POA: Diagnosis not present

## 2020-12-13 LAB — URINALYSIS, COMPLETE (UACMP) WITH MICROSCOPIC
Bacteria, UA: NONE SEEN
Bilirubin Urine: NEGATIVE
Glucose, UA: NEGATIVE mg/dL
Hgb urine dipstick: NEGATIVE
Ketones, ur: NEGATIVE mg/dL
Leukocytes,Ua: NEGATIVE
Nitrite: NEGATIVE
Protein, ur: NEGATIVE mg/dL
Specific Gravity, Urine: 1.012 (ref 1.005–1.030)
pH: 7 (ref 5.0–8.0)

## 2020-12-13 LAB — CBC WITH DIFFERENTIAL/PLATELET
Abs Immature Granulocytes: 0.03 10*3/uL (ref 0.00–0.07)
Basophils Absolute: 0 10*3/uL (ref 0.0–0.1)
Basophils Relative: 0 %
Eosinophils Absolute: 0.2 10*3/uL (ref 0.0–0.5)
Eosinophils Relative: 2 %
HCT: 38.5 % (ref 36.0–46.0)
Hemoglobin: 12.7 g/dL (ref 12.0–15.0)
Immature Granulocytes: 0 %
Lymphocytes Relative: 19 %
Lymphs Abs: 1.5 10*3/uL (ref 0.7–4.0)
MCH: 35.2 pg — ABNORMAL HIGH (ref 26.0–34.0)
MCHC: 33 g/dL (ref 30.0–36.0)
MCV: 106.6 fL — ABNORMAL HIGH (ref 80.0–100.0)
Monocytes Absolute: 0.6 10*3/uL (ref 0.1–1.0)
Monocytes Relative: 8 %
Neutro Abs: 5.3 10*3/uL (ref 1.7–7.7)
Neutrophils Relative %: 71 %
Platelets: 300 10*3/uL (ref 150–400)
RBC: 3.61 MIL/uL — ABNORMAL LOW (ref 3.87–5.11)
RDW: 13.9 % (ref 11.5–15.5)
WBC: 7.5 10*3/uL (ref 4.0–10.5)
nRBC: 0 % (ref 0.0–0.2)

## 2020-12-13 LAB — COMPREHENSIVE METABOLIC PANEL
ALT: 76 U/L — ABNORMAL HIGH (ref 0–44)
AST: 94 U/L — ABNORMAL HIGH (ref 15–41)
Albumin: 3.1 g/dL — ABNORMAL LOW (ref 3.5–5.0)
Alkaline Phosphatase: 92 U/L (ref 38–126)
Anion gap: 7 (ref 5–15)
BUN: 6 mg/dL (ref 6–20)
CO2: 29 mmol/L (ref 22–32)
Calcium: 9.2 mg/dL (ref 8.9–10.3)
Chloride: 100 mmol/L (ref 98–111)
Creatinine, Ser: 0.5 mg/dL (ref 0.44–1.00)
GFR, Estimated: >60 mL/min (ref >60)
Glucose, Bld: 183 mg/dL — ABNORMAL HIGH (ref 70–99)
Potassium: 4.1 mmol/L (ref 3.5–5.1)
Sodium: 136 mmol/L (ref 135–145)
Total Bilirubin: 0.8 mg/dL (ref 0.3–1.2)
Total Protein: 7.1 g/dL (ref 6.5–8.1)

## 2020-12-13 LAB — AMMONIA: Ammonia: 32 umol/L (ref 9–35)

## 2020-12-13 LAB — PREGNANCY, URINE: Preg Test, Ur: NEGATIVE

## 2020-12-13 LAB — TROPONIN I (HIGH SENSITIVITY): Troponin I (High Sensitivity): <2 ng/L (ref <18)

## 2020-12-13 LAB — LIPASE, BLOOD: Lipase: 34 U/L (ref 11–51)

## 2020-12-13 MED ORDER — METRONIDAZOLE 500 MG PO TABS: 500.0000 mg | ORAL_TABLET | Freq: Two times a day (BID) | ORAL | 0 refills | Status: DC

## 2020-12-13 MED ORDER — FLUCONAZOLE 200 MG PO TABS: 200.0000 mg | ORAL_TABLET | ORAL | 0 refills | Status: DC

## 2020-12-13 MED ORDER — FLUCONAZOLE 200 MG PO TABS: 200.0000 mg | ORAL_TABLET | ORAL | 0 refills | Status: AC

## 2020-12-13 NOTE — ED Provider Notes (Signed)
[name]PART[facility] Provider Note   CSN: [id number] Arrival date & time: 12/13/20  6720     History No chief complaint on file.   Hannah Young is a 55 y.o. female.  HPI  P/w vaginal pain, itching, dysuria, thick vaginal discharge, abdominal pain.  Vaginal sx: 1wk in duration. Gradual in onset. Last had sex a couple of weeks ago and is worried about STDs vs yeast infection. Says her discharge is thick like cottage cheese. Constant itching that is painful/uncomfortable. Had heavy bleeding a week ago, irregular periods, tapered off 3d ago, now only occasional small amounts of blood.  Dysuria: feels pain every time she urinates, unsure if this is from her vagina or truly with urination. Sees a small amount of blood that she believes is from her vagina.   Abdominal pain: low in her abdomen where she suspects she feels it from her vagina. Also feels it generalized, like a vague soreness. Eating well. No nausea or vomiting. No fevers. Normal BM. Finally, c/o new bump in her abdomen in LLQ. Recently noticed, unsure duration, feels it is tender.  On RoS, states she "fell out" twice in the past 1-2 weeks. Both times did not sustain trauma. Has a history of this. Hasn't been evaluated for it recently. Brief episodes, describes total loss of consciousness. No associated palpitations, CP, SOB, nausea, diaphoresis. Was with someone during each episode and returned home and felt better both times.   Medical history autoimmune hepatitis: currently on 31m prednisone, 6-MP. Has not generally followed up with her GI doctor.      Past Medical History:  Diagnosis Date  . Anxiety   . Arthritis   . Asthma   . [place]immune hepatitis ([place]   . Bipolar 1 disorder ([place]   . Breast lump   . Depression   . Diabetes mellitus   . FH: chemotherapy   . Gout   . High cholesterol   . Hypertension   . Liver cirrhosis ([place]   . Lung nodules   . Neuropathy   . Panic disorder    . Vaginal discharge 01/25/2017  . Vertigo     Patient Active Problem List   Diagnosis Date Noted  . Chronic right shoulder pain 06/02/2020  . Bilateral occipital neuralgia 05/01/2020  . Chronic neck pain 05/01/2020  . Osteoarthritis 02/12/2020  . Healthcare maintenance 04/29/2018  . Multiple lung nodules on CT 01/25/2017  . Depression 01/25/2017  . Autoimmune hepatitis ([place] 01/30/2012  . Diabetes mellitus ([place] 01/30/2012  . Asthma 01/30/2012  . Hypertension 01/30/2012    Past Surgical History:  Procedure Laterality Date  . Biopsy of liver    . CESAREAN SECTION    . LAPAROSCOPY    . TUBAL LIGATION       OB History    Gravida  6   Para  4   Term  0   Preterm  4   AB  2   Living  4     SAB  2   IAB  0   Ectopic      Multiple  0   Live Births  3           Family History  Problem Relation Age of Onset  . Liver disease Mother   . Kidney failure Father   . Heart disease Sister   . Liver disease Daughter   . Anxiety disorder Daughter   . Heart disease Son   . Colon cancer Neg  Hx   . Stomach cancer Neg Hx   . Migraines Neg Hx   . Headache Neg Hx     Social History   Tobacco Use  . Smoking status: Former Smoker    Packs/day: 0.30    Types: Cigarettes    Quit date: 08/22/2020    Years since quitting: 0.3  . Smokeless tobacco: Never Used  . Tobacco comment: 2-3 cigs/day  Vaping Use  . Vaping Use: Never used  Substance Use Topics  . Alcohol use: Not Currently    Alcohol/week: 3.0 standard drinks    Types: 3 Cans of beer per week  . Drug use: No    Home Medications Prior to Admission medications   Medication Sig Start Date End Date Taking? Authorizing Provider  Accu-Chek Softclix Lancets lancets Use as instructed to check blood sugar 3x a day 07/16/20   Aslam, Loralyn Freshwater, MD  albuterol (VENTOLIN HFA) 108 (90 Base) MCG/ACT inhaler INHALE ONE TO TWO puffs into THE lungs EVERY SIX HOURS AS NEEDED SHORTNESS OF BREATH 10/21/20   Sanjuan Dame,  MD  benzonatate (TESSALON) 100 MG capsule Take 2 capsules (200 mg total) by mouth 3 (three) times daily. 09/10/20   Raylene Everts, MD  Blood Glucose Monitoring Suppl (ACCU-CHEK GUIDE ME) w/Device KIT Use 3 times per day to check your blood sugar. 07/16/20   Harvie Heck, MD  Capsaicin 0.025 % GEL Apply 4 application topically 3 (three) times daily. 12/01/19   Maudie Mercury, MD  diphenhydrAMINE (BENADRYL) 25 mg capsule Take 25 mg by mouth every 6 (six) hours as needed for itching.    [provider]  fluconazole (DIFLUCAN) 200 MG tablet Take 1 tablet (200 mg total) by mouth every 3 (three) days for 2 doses. 12/13/20 12/17/20  Aris Lot, MD  fluticasone (FLONASE) 50 MCG/ACT nasal spray Place 2 sprays into both nostrils daily as needed for allergies or rhinitis.     [provider]  glucose blood (ACCU-CHEK GUIDE) test strip Use as instructed to check blood sugar 3 times a day 07/16/20   Harvie Heck, MD  insulin aspart (NOVOLOG) 100 UNIT/ML injection Inject 5 Units into the skin 3 (three) times daily with meals. 09/21/20   Jose Persia, MD  Insulin Glargine-Lixisenatide (SOLIQUA) 100-33 UNT-MCG/ML SOPN Inject 15 Units into the skin daily. 10/21/20   Sanjuan Dame, MD  lisinopril (ZESTRIL) 10 MG tablet Take 1 tablet (10 mg total) by mouth daily. 01/29/20   Earlene Plater, MD  mercaptopurine (PURINETHOL) 50 MG tablet Take 50 mg by mouth daily. Give on an empty stomach 1 hour before or 2 hours after meals. Caution: Chemotherapy.    [provider]  metroNIDAZOLE (FLAGYL) 500 MG tablet Take 1 tablet (500 mg total) by mouth 2 (two) times daily. 12/13/20   Aris Lot, MD  omeprazole (PRILOSEC) 40 MG capsule Take 40 mg by mouth daily. 06/28/20   [provider]  predniSONE (DELTASONE) 20 MG tablet Take 40 mg by mouth daily with breakfast.    [provider]  triamcinolone (KENALOG) 0.1 % Apply 1 application topically 2 (two) times daily. Thin  amount to face for rash/itching 07/05/20   Volney American, PA-C    Allergies    Amoxicillin, Augmentin [amoxicillin-pot clavulanate], Tylenol [acetaminophen], and Latex  Review of Systems   Review of Systems  Constitutional: Negative for chills and fever.  HENT: Negative for ear pain and sore throat.   Eyes: Negative for pain and visual disturbance.  Respiratory: Negative for cough  and shortness of breath.   Cardiovascular: Negative for chest pain and palpitations.  Gastrointestinal: Positive for abdominal pain. Negative for constipation, diarrhea, nausea and vomiting.  Genitourinary: Positive for dysuria, vaginal bleeding, vaginal discharge and vaginal pain. Negative for hematuria.  Musculoskeletal: Negative for arthralgias and back pain.  Skin: Negative for color change and rash.  Neurological: Negative for seizures and syncope.  All other systems reviewed and are negative.   Physical Exam Updated Vital Signs BP (!) 147/87 (BP Location: Right Arm)   Pulse 64   Temp 98 F (36.7 C) (Oral)   Resp 15   Ht _0  (1.549 m)   Wt 81.6 kg   SpO2 100%   BMI 34.01 kg/m   Physical Exam Vitals and nursing note reviewed.  Constitutional:      General: She is not in acute distress.    Appearance: She is well-developed.  HENT:     Head: Normocephalic and atraumatic.  Eyes:     Conjunctiva/sclera: Conjunctivae normal.  Cardiovascular:     Rate and Rhythm: Normal rate and regular rhythm.     Heart sounds: No murmur heard.   Pulmonary:     Effort: Pulmonary effort is normal. No respiratory distress.     Breath sounds: Normal breath sounds.  Abdominal:     General: There is no distension.     Palpations: Abdomen is soft.     Tenderness: There is abdominal tenderness (LLQ and suprapubic. Very small nodular area in subcu LLQ that is tender to palpation; regular borders on palpation, no induration, erythema, warmth). There is no guarding or rebound.  Musculoskeletal:      Cervical back: Neck supple.     Right lower leg: No edema.     Left lower leg: No edema.  Skin:    General: Skin is warm and dry.  Neurological:     Mental Status: She is alert.     Comments:   MENTAL STATUS: AAOx3   LANG/SPEECH: Fluent, intact medical history with good comprehension   CRANIAL NERVES:   II: Pupils equal and reactive   III, IV, VI: EOM intact, no gaze preference or deviation   V: normal   VII: no facial asymmetry   VIII: normal hearing to speech   MOTOR: 5/5 in both upper and lower extremities   SENSORY: Equal and symmetric in bilateral upper and lower extremities   COORD: Normal finger to nose, no tremor, no dysmetria  Psychiatric:        Behavior: Behavior normal.        Thought Content: Thought content normal.     ED Results / Procedures / Treatments   Labs (all labs ordered are listed, but only abnormal results are displayed) Labs Reviewed  CBC WITH DIFFERENTIAL/PLATELET - Abnormal; Notable for the following components:      Result Value   RBC 3.61 (*)    MCV 106.6 (*)    MCH 35.2 (*)    All other components within normal limits  COMPREHENSIVE METABOLIC PANEL - Abnormal; Notable for the following components:   Glucose, Bld 183 (*)    Albumin 3.1 (*)    AST 94 (*)    ALT 76 (*)    All other components within normal limits  URINE CULTURE  URINALYSIS, COMPLETE (UACMP) WITH MICROSCOPIC  LIPASE, BLOOD  AMMONIA  PREGNANCY, URINE  RPR  GC/CHLAMYDIA PROBE AMP ([place]) NOT AT [facility]  WET PREP  (BD AFFIRM) ([place])  WET PREP  (BD AFFIRM) (  [place])  TROPONIN I (HIGH SENSITIVITY)    EKG None  Radiology DG Chest 2 View  Result Date: 12/13/2020 CLINICAL DATA:  55 year old female with vaginal bleeding and syncope. EXAM: CHEST - 2 VIEW COMPARISON:  Chest radiographs 07/23/2020 and earlier. FINDINGS: Hair artifact over the upper chest. Lung volumes and mediastinal contours remain normal. Visualized tracheal air column is within normal limits.  The lungs appear stable since 2017 and clear with no pneumothorax or pleural effusion. No acute osseous abnormality identified. Negative visible bowel gas pattern. IMPRESSION: No acute cardiopulmonary abnormality. Electronically Signed   By: Genevie Ann M.D.   On: 12/13/2020 09:54    Procedures Procedures   Medications Ordered in ED Medications - No data to display  ED Course  I have reviewed the triage vital signs and the nursing notes.  Pertinent labs & imaging results that were available during my care of the patient were reviewed by me and considered in my medical decision making (see chart for details).    MDM Rules/Calculators/A&P                          48yoM PMH above p/w vaginal pain primarily; also abd pain and two episodes of syncope. PMH and meds reviewed.  Vaginal pain: concern for STDs; ddx cervicitis, STI, yeast infection, UTI, PID. Counseled patient on pelvic exam but she declines and wants to self swab.  Abdominal pain: lack of other GI sx (nausea, vomiting, diarrhea, fever) with reassuring overall exam, small cyst palpated in LLQ; CT scan not emergently indicated. Possibly r/t pelvic pain. No signs/sx biliary pathology, no RUQ ttp, no signs/sx appendicitis, diverticulitis unlikely with above H&P  Syncope: pt seems unconcerned, detected on RoS. No cardiopulmonary sx. Will risk stratify with ECG, labs, single Tn.  Will obtain lab studies incl self swab studies. Pt wants syphilis test and agrees to f/u. HIV not indicated due to recent intercourse, would not be positive, pt understands, will f/u PCP.  Labs reviewed; reassuring. ECG with normal intervals no focal ischemic TWI no ST changes CXR without any acute cardiopulmonary abnormality on my review heart border wnl no focal consolidations or edema Tn wnl Wet prep unfortunately could not be resulted after multiple attempts. Pt is concerned about h/o trich and fungal infex and would like empiric tx with is reasonable;  agrees to f/u PCP regaridng remainder results. Discharged in stable condition with no acute event.     Final Clinical Impression(s) / ED Diagnoses Final diagnoses:  Vaginal itching    Rx / DC Orders ED Discharge Orders         Ordered    fluconazole (DIFLUCAN) 200 MG tablet  Every 3 DAYS,   Status:  Discontinued        12/13/20 1448    metroNIDAZOLE (FLAGYL) 500 MG tablet  2 times daily,   Status:  Discontinued        12/13/20 1448    fluconazole (DIFLUCAN) 200 MG tablet  Every 3 DAYS        12/13/20 1504    metroNIDAZOLE (FLAGYL) 500 MG tablet  2 times daily        12/13/20 1504           Aris Lot, MD 12/13/20 1705    Luna Fuse, MD 12/15/20 1428

## 2020-12-13 NOTE — ED Triage Notes (Signed)
Pt here with reports of vaginal bleeding and irritation, trouble breathing, and hair falling out. Pt states she contributes this to the mold in her house. Wants to get evaluated. Pt also concerned for STI.

## 2020-12-13 NOTE — ED Notes (Signed)
Contacted lab about wet prep. I was informed specimen was lost. Contact provider for new order.

## 2020-12-13 NOTE — ED Notes (Signed)
Provider at bedside

## 2020-12-13 NOTE — ED Notes (Signed)
Patient transported to X-ray 

## 2020-12-13 NOTE — ED Notes (Signed)
Pt gone to restroom

## 2020-12-13 NOTE — ED Notes (Signed)
Patient transported from xray

## 2020-12-13 NOTE — ED Notes (Signed)
Discharge paperwork given to pt along with prescriptions. Pt agreeable to discharge and understands instructions. Esignature pad not working. 

## 2020-12-13 NOTE — ED Notes (Signed)
Called to have culture add on spoke with [facility]

## 2020-12-13 NOTE — ED Notes (Signed)
Pt back from restroom

## 2020-12-13 NOTE — Discharge Instructions (Addendum)
Follow-up with your primary care doctor about your STD checks Take the fluconazole and flagyl as discussed. Follow-up with your doctor about the passing out spells Take the second dose of fluconazole if you are still having vaginal discharge

## 2020-12-14 LAB — RPR: RPR Ser Ql: NONREACTIVE

## 2020-12-14 LAB — URINE CULTURE: Culture: NO GROWTH

## 2020-12-14 LAB — GC/CHLAMYDIA PROBE AMP (~~LOC~~) NOT AT ARMC
Chlamydia: NEGATIVE
Comment: NEGATIVE
Comment: NORMAL
Neisseria Gonorrhea: NEGATIVE

## 2020-12-16 ENCOUNTER — Telehealth: Payer: Self-pay | Admitting: Internal Medicine

## 2020-12-16 NOTE — Telephone Encounter (Signed)
I called Hannah Young and updated her of her findings.  She had no other questions or concerns at the time of our discussion.  I instructed her to follow-up with her OB/GYN as well as return to the emergency department if she has increased vaginal bleeding.  Also instructed her that if her OB/GYN cannot see her within the next week or so that she should call our clinic and schedule an appointment to be seen here.

## 2020-12-16 NOTE — Telephone Encounter (Signed)
Pt requesting a call back about her  Most recent test results.

## 2020-12-17 ENCOUNTER — Other Ambulatory Visit: Payer: Self-pay | Admitting: Internal Medicine

## 2020-12-17 ENCOUNTER — Telehealth (HOSPITAL_COMMUNITY): Payer: Self-pay

## 2020-12-17 DIAGNOSIS — Z1231 Encounter for screening mammogram for malignant neoplasm of breast: Secondary | ICD-10-CM

## 2020-12-24 ENCOUNTER — Ambulatory Visit: Payer: Medicare HMO | Admitting: Obstetrics

## 2020-12-31 ENCOUNTER — Other Ambulatory Visit: Payer: Self-pay | Admitting: Student

## 2020-12-31 DIAGNOSIS — E119 Type 2 diabetes mellitus without complications: Secondary | ICD-10-CM

## 2021-01-03 ENCOUNTER — Encounter: Payer: Self-pay | Admitting: Obstetrics

## 2021-01-03 ENCOUNTER — Other Ambulatory Visit: Payer: Self-pay

## 2021-01-03 ENCOUNTER — Other Ambulatory Visit (HOSPITAL_COMMUNITY)
Admission: RE | Admit: 2021-01-03 | Discharge: 2021-01-03 | Disposition: A | Discharge status: Home / Self Care | Payer: Medicare HMO | Source: Ambulatory Visit | Attending: Obstetrics | Admitting: Obstetrics

## 2021-01-03 ENCOUNTER — Ambulatory Visit (INDEPENDENT_AMBULATORY_CARE_PROVIDER_SITE_OTHER): Payer: Medicare HMO | Admitting: Obstetrics

## 2021-01-03 ENCOUNTER — Telehealth: Payer: Self-pay | Admitting: Physical Medicine and Rehabilitation

## 2021-01-03 VITALS — BP 138/88 | HR 80 | Wt 188.0 lb

## 2021-01-03 DIAGNOSIS — R300 Dysuria: Secondary | ICD-10-CM | POA: Diagnosis not present

## 2021-01-03 DIAGNOSIS — N950 Postmenopausal bleeding: Secondary | ICD-10-CM

## 2021-01-03 DIAGNOSIS — N898 Other specified noninflammatory disorders of vagina: Secondary | ICD-10-CM

## 2021-01-03 DIAGNOSIS — R102 Pelvic and perineal pain: Secondary | ICD-10-CM | POA: Diagnosis not present

## 2021-01-03 DIAGNOSIS — K769 Liver disease, unspecified: Secondary | ICD-10-CM | POA: Diagnosis not present

## 2021-01-03 DIAGNOSIS — Z113 Encounter for screening for infections with a predominantly sexual mode of transmission: Secondary | ICD-10-CM | POA: Diagnosis not present

## 2021-01-03 LAB — POCT URINALYSIS DIPSTICK
Bilirubin, UA: NEGATIVE
Glucose, UA: NEGATIVE
Ketones, UA: NEGATIVE
Leukocytes, UA: NEGATIVE
Nitrite, UA: NEGATIVE
Odor: NEGATIVE
Protein, UA: NEGATIVE
Spec Grav, UA: 1.015 (ref 1.010–1.025)
Urobilinogen, UA: 0.2 E.U./dL
pH, UA: 7 (ref 5.0–8.0)

## 2021-01-03 NOTE — Progress Notes (Addendum)
Patient ID: Hannah Young, female   DOB: Oct 24, 1965, 55 y.o.   MRN: [id number]  No chief complaint on file.   HPI Hannah Young is a 55 y.o. female.  No period since 2020, then onset of period-like vaginal bleeding starting in April of this year.  Has had significant cramping associated with the bleeding.  Also c/o urinary frequency and burning.  PMH significant for chronic liver disease. HPI  Past Medical History:  Diagnosis Date   Anxiety    Arthritis    Asthma    Autoimmune hepatitis ([place])    Bipolar 1 disorder ([place])    Breast lump    Depression    Diabetes mellitus    FH: chemotherapy    Gout    High cholesterol    Hypertension    Liver cirrhosis ([facility])    Lung nodules    Neuropathy    Panic disorder    Vaginal discharge 01/25/2017   Vertigo     Past Surgical History:  Procedure Laterality Date   Biopsy of liver     CESAREAN SECTION     LAPAROSCOPY     TUBAL LIGATION      Family History  Problem Relation Age of Onset   Liver disease Mother    Kidney failure Father    Heart disease Sister    Liver disease Daughter    Anxiety disorder Daughter    Heart disease Son    Colon cancer Neg Hx    Stomach cancer Neg Hx    Migraines Neg Hx    Headache Neg Hx     Social History Social History   Tobacco Use   Smoking status: Former Smoker    Packs/day: 0.30    Types: Cigarettes    Quit date: 08/22/2020    Years since quitting: 0.3   Smokeless tobacco: Never Used   Tobacco comment: 2-3 cigs/day  Vaping Use   Vaping Use: Never used  Substance Use Topics   Alcohol use: Not Currently    Alcohol/week: 3.0 standard drinks    Types: 3 Cans of beer per week   Drug use: No    Allergies  Allergen Reactions   Amoxicillin Rash, Shortness Of Breath and Swelling   Augmentin [Amoxicillin-Pot Clavulanate] Anaphylaxis    Has patient had a PCN reaction causing immediate rash, facial/tongue/throat swelling, SOB or lightheadedness with hypotension: Yes Has patient had  a PCN reaction causing severe rash involving mucus membranes or skin necrosis: No Has patient had a PCN reaction that required hospitalization: No Has patient had a PCN reaction occurring within the last 10 years: Yes If all of the above answers are "NO", then may proceed with Cephalosporin use.    Tylenol [Acetaminophen] Other (See Comments)    Patient has liver disease, MD has stated that [place].    Latex Rash    Current Outpatient Medications  Medication Sig Dispense Refill   Accu-Chek Softclix Lancets lancets Use as instructed to check blood sugar 3x a day 300 each 3   albuterol (VENTOLIN HFA) 108 (90 Base) MCG/ACT inhaler INHALE ONE TO TWO puffs into THE lungs EVERY SIX HOURS AS NEEDED SHORTNESS OF BREATH 18 g 12   Blood Glucose Monitoring Suppl (ACCU-CHEK GUIDE ME) w/Device KIT Use 3 times per day to check your blood sugar. 1 kit 0   Capsaicin 0.025 % GEL Apply 4 application topically 3 (three) times daily. 60 g 3   diphenhydrAMINE (BENADRYL) 25 mg capsule  Take 25 mg by mouth every 6 (six) hours as needed for itching.     fluticasone (FLONASE) 50 MCG/ACT nasal spray Place 2 sprays into both nostrils daily as needed for allergies or rhinitis.      glucose blood (ACCU-CHEK GUIDE) test strip Use as instructed to check blood sugar 3 times a day 300 each 3   insulin aspart (NOVOLOG) 100 UNIT/ML injection Inject 5 Units into the skin 3 (three) times daily with meals. 30 mL 1   lisinopril (ZESTRIL) 10 MG tablet Take 1 tablet (10 mg total) by mouth daily. 90 tablet 1   mercaptopurine (PURINETHOL) 50 MG tablet Take 50 mg by mouth daily. Give on an empty stomach 1 hour before or 2 hours after meals. Caution: Chemotherapy.     omeprazole (PRILOSEC) 40 MG capsule Take 40 mg by mouth daily.     predniSONE (DELTASONE) 20 MG tablet Take 40 mg by mouth daily with breakfast.     SOLIQUA 100-33 UNT-MCG/ML SOPN Inject 15 Units into the skin daily. 15 mL 0   triamcinolone  (KENALOG) 0.1 % Apply 1 application topically 2 (two) times daily. Thin amount to face for rash/itching 30 g 0   benzonatate (TESSALON) 100 MG capsule Take 2 capsules (200 mg total) by mouth 3 (three) times daily. (Patient not taking: Reported on 01/03/2021) 20 capsule 0   metroNIDAZOLE (FLAGYL) 500 MG tablet Take 1 tablet (500 mg total) by mouth 2 (two) times daily. (Patient not taking: Reported on 01/03/2021) 14 tablet 0   No current facility-administered medications for this visit.    Review of Systems Review of Systems Constitutional: negative for fatigue and weight loss Respiratory: negative for cough and wheezing Cardiovascular: negative for chest pain, fatigue and palpitations Gastrointestinal: negative for abdominal pain and change in bowel habits Genitourinary: positive for pelvic pain vaginal bleeding, and vaginal discharge and irritation.  Also severe hot flushes Integument/breast: negative for nipple discharge Musculoskeletal:negative for myalgias Neurological: negative for gait problems and tremors Behavioral/Psych: negative for abusive relationship, depression Endocrine: negative for temperature intolerance      Blood pressure 138/88, pulse 80, weight 188 lb (85.3 kg), last menstrual period 12/16/2020.  Physical Exam Physical Exam General:   alert and no distress  Skin:   no rash or abnormalities  Lungs:   clear to auscultation bilaterally  Heart:   regular rate and rhythm, S1, S2 normal, no murmur, click, rub or gallop  Breasts:   normal without suspicious masses, skin or nipple changes or axillary nodes  Abdomen:  normal findings: no organomegaly, soft, non-tender and no hernia  Pelvis:  External genitalia: normal general appearance Urinary system: urethral meatus normal and bladder without fullness, nontender Vaginal: normal without tenderness, induration or masses Cervix: normal appearance Adnexa: normal bimanual exam Uterus: anteverted and non-tender, normal size     I have spent a total of 20 minutes of face-to-face and time, excluding clinical staff time, reviewing notes and preparing to see patient, ordering tests and/or medications, and counseling the patient.  Data Reviewed Labs ER visit notes and findings  Assessment       1. Postmenopausal bleeding  2. Pelvic pain Rx: - POCT Urinalysis Dipstick - US PELVIC COMPLETE WITH TRANSVAGINAL; Future  3. Dysuria Rx: - Urine Culture  4. Vaginal discharge Rx: - Cervicovaginal ancillary only  5. Screen for STD (sexually transmitted disease) Rx: - Hepatitis B surface antigen - Hepatitis C antibody - HIV Antibody (routine testing w rflx)  6. Chronic liver disease -  followed by PCP  7. Severe Hot Flashes - estrogen would be contraindicated with history of chronic liver disease - may be a candidate for non-hormonal treatments   Plan   Follow up in 2 weeks for Endometrial Biopsy.  Educational brochure dispensed.  Orders Placed This Encounter  Procedures   Urine Culture   US PELVIC COMPLETE WITH TRANSVAGINAL    Standing Status:   Future    Standing Expiration Date:   01/03/2022    Order Specific Question:   Reason for Exam (SYMPTOM  OR DIAGNOSIS REQUIRED)    Answer:   Postmenopausal bleeding.    Order Specific Question:   Preferred imaging location?    Answer:   [facility]   Hepatitis B surface antigen   Hepatitis C antibody   HIV Antibody (routine testing w rflx)   POCT Urinalysis Dipstick     Shelly Bombard, MD 01/03/2021 12:28 PM

## 2021-01-03 NOTE — Progress Notes (Signed)
Pt reports vaginal irritation, vaginal discharge, and dysuria.  She also reports vaginal bleeding - hasn't had a period in 2 yrs. Completed Flagyl and Diflucan a few weeks ago without relief She requests all STD testing LSIL +HPV pap on 02/12/2020 LSIL colposcopy on 03/18/2020 RPR negative on 12/13/20

## 2021-01-03 NOTE — Patient Instructions (Signed)
Abnormal Uterine Bleeding Abnormal uterine bleeding means bleeding more than usual from your womb (uterus). It can include:  Bleeding between menstrual periods.  Bleeding after sex.  Bleeding that is heavier than normal.  Menstrual periods that last longer than usual.  Bleeding after you have stopped having your menstrual period (menopause). There are many problems that may cause this. You should see a doctor for any kind of bleeding that is not normal. Treatment depends on the cause of the bleeding. Follow these instructions at home: Medicines  Take over-the-counter and prescription medicines only as told by your doctor.  Tell your doctor about other medicines that you take. ? If told by your doctor, stop taking aspirin or medicines that have aspirin in them. These medicines can make you bleed more.  You may be given iron pills to replace iron that your body loses because of this condition. Take them as told by your doctor. Managing constipation If you are taking iron pills, you may have trouble pooping (constipation). To prevent or treat trouble pooping, you may need to:  Drink enough fluid to keep your pee (urine) pale yellow.  Take over-the-counter or prescription medicines.  Eat foods that are high in fiber. These include beans, whole grains, and fresh fruits and vegetables.  Limit foods that are high in fat and sugar. These include fried or sweet foods. General instructions  Watch your condition for any changes.  Do not use tampons, douche, or have sex, if your doctor tells you not to.  Change your pads often.  Get regular exams. This includes pelvic exams and cervical cancer screenings. ? It is up to you to get the results of any tests that are done. Ask your doctor, or the department that is doing the tests, when your results will be ready.  Keep all follow-up visits as told by your doctor. This is important. Contact a doctor if:  The bleeding lasts more than 1  week.  You feel dizzy at times.  You feel like you may vomit (nausea).  You vomit.  You feel light-headed or weak.  Your symptoms get worse. Get help right away if:  You pass out.  You have to change pads every hour.  You have pain in your belly.  You have a fever or chills.  You get sweaty.  You get weak.  You pass large blood clots from your vagina. Summary  Abnormal uterine bleeding means bleeding more than usual from your womb (uterus).  Any kind of bleeding that is not normal should be checked by a doctor.  Treatment depends on the cause of the bleeding.  Get help right away if you pass out, you have to change pads every hour, or you pass large blood clots from your vagina. This information is not intended to replace advice given to you by your health care provider. Make sure you discuss any questions you have with your health care provider. Document Revised: 06/03/2019 Document Reviewed: 06/03/2019 Elsevier Patient Education  2021 Elsevier Inc.  Postmenopausal Bleeding Postmenopausal bleeding is any bleeding that a woman has after she has entered menopause. Menopause is the end of a woman's fertile years. After menopause, a woman no longer ovulates and does not have menstrual periods. Therefore, she should no longer have bleeding from her vagina. Postmenopausal bleeding may have various causes, including:  Menopausal hormone therapy (MHT).  Endometrial atrophy. After menopause, low estrogen hormone levels cause the membrane that lines the uterus (endometrium) to become thin. You may have  bleeding as the endometrium thins.  Endometrial hyperplasia. This condition is caused by excess estrogen hormones and low levels of progesterone hormones. The excess estrogen causes the endometrium to thicken, which can lead to bleeding. In some cases, this can lead to cancer of the uterus.  Endometrial cancer.  Noncancerous growths (polyps) on the endometrium, the lining of  the uterus, or the cervix.  Uterine fibroids. These are noncancerous growths in or around the uterus muscle tissue that can cause heavy bleeding. Any type of postmenopausal bleeding, even if it appears to be a typical menstrual period, should be checked by your health care provider. Treatment will depend on the cause of the bleeding. Follow these instructions at home:  Pay attention to any changes in your symptoms. Let your health care provider know about them.  Avoid using tampons and douches as told by your health care provider.  Change your pads regularly.  Get regular pelvic exams, including Pap tests, as told by your health care provider.  Take iron supplements as told by your health care provider.  Take over-the-counter and prescription medicines only as told by your health care provider.  Keep all follow-up visits. This is important.   Contact a health care provider if:  You have new bleeding from the vagina after menopause.  You have pain in your abdomen. Get help right away if:  You have a fever or chills.  You have severe pain with bleeding.  You are passing blood clots.  You have heavy bleeding, need more than 1 pad an hour, and have never experienced this before.  You have headaches or feel faint or dizzy. Summary  Postmenopausal bleeding is any bleeding that a woman has after she has entered into menopause.  Postmenopausal bleeding may have various causes. Treatment will depend on the cause of the bleeding.  Any type of postmenopausal bleeding, even if it appears to be a typical menstrual period, should be checked by your health care provider.  Be sure to pay attention to any changes in your symptoms and keep all follow-up visits. This information is not intended to replace advice given to you by your health care provider. Make sure you discuss any questions you have with your health care provider. Document Revised: 01/15/2020 Document Reviewed:  01/15/2020 Elsevier Patient Education  2021 Elsevier Inc.  Endometrial Biopsy  An endometrial biopsy is a procedure to remove tissue samples from the endometrium, which is the lining of the uterus. The tissue that is removed can then be checked under a microscope for disease. This procedure is used to diagnose conditions such as endometrial cancer, endometrial tuberculosis, polyps, or other inflammatory conditions. This procedure may also be used to investigate uterine bleeding to determine where you are in your menstrual cycle or how your hormone levels are affecting the lining of the uterus. Tell a health care provider about:  Any allergies you have.  All medicines you are taking, including vitamins, herbs, eye drops, creams, and over-the-counter medicines.  Any problems you or family members have had with anesthetic medicines.  Any blood disorders you have.  Any surgeries you have had.  Any medical conditions you have.  Whether you are pregnant or may be pregnant. What are the risks? Generally, this is a safe procedure. However, problems may occur, including:  Bleeding.  Pelvic infection.  Puncture of the wall of the uterus with the biopsy device (rare).  Allergic reactions to medicines. What happens before the procedure?  Keep a record of your menstrual  cycles as told by your health care provider. You may need to schedule your procedure for a specific time in your cycle.  You may want to bring a sanitary pad to wear after the procedure.  Plan to have someone take you home from the hospital or clinic.  Ask your health care provider about: ? Changing or stopping your regular medicines. This is especially important if you are taking diabetes medicines, arthritis medicines, or blood thinners. ? Taking medicines such as aspirin and ibuprofen. These medicines can thin your blood. Do not take these medicines unless your health care provider tells you to take them. ? Taking  over-the-counter medicines, vitamins, herbs, and supplements. What happens during the procedure?  You will lie on an exam table with your feet and legs supported as in a pelvic exam.  Your health care provider will insert an instrument (speculum) into your vagina to see your cervix.  Your cervix will be cleansed with an antiseptic solution.  A medicine (local anesthetic) will be used to numb the cervix.  A forceps instrument (tenaculum) will be used to hold your cervix steady for the biopsy.  A thin, rod-like instrument (uterine sound) will be inserted through your cervix to determine the length of your uterus and the location where the biopsy sample will be removed.  A thin, flexible tube (catheter) will be inserted through your cervix and into the uterus. The catheter will be used to collect the biopsy sample from your endometrial tissue.  The catheter and speculum will then be removed, and the tissue sample will be sent to a lab for examination. The procedure may vary among health care providers and hospitals. What can I expect after procedure?  You will rest in a recovery area until you are ready to go home.  You may have mild cramping and a small amount of vaginal bleeding. This is normal.  You may have a small amount of vaginal bleeding for a few days. This is normal.  It is up to you to get the results of your procedure. Ask your health care provider, or the department that is doing the procedure, when your results will be ready. Follow these instructions at home:  Take over-the-counter and prescription medicines only as told by your health care provider.  Do not douche, use tampons, or have sexual intercourse until your health care provider approves.  Return to your normal activities as told by your health care provider. Ask your health care provider what activities are safe for you.  Follow instructions from your health care provider about any activity restrictions, such  as restrictions on strenuous exercise or heavy lifting.  Keep all follow-up visits. This is important. Contact a health care provider:  You have heavy bleeding, or bleed for longer than 2 days after the procedure.  You have bad smelling discharge from your vagina.  You have a fever or chills.  You have a burning sensation when urinating or you have difficulty urinating.  You have severe pain in your lower abdomen. Get help right away if you:  You have severe cramps in your stomach or back.  You pass large blood clots.  Your bleeding increases.  You become weak or light-headed, or you faint or lose consciousness. Summary  An endometrial biopsy is a procedure to remove tissue samples is taken from the endometrium, which is the lining of the uterus.  The tissue sample that is removed will be checked under a microscope for disease.  This procedure is  used to diagnose conditions such as endometrial cancer, endometrial tuberculosis, polyps, or other inflammatory conditions.  After the procedure, it is common to have mild cramping and a small amount of vaginal bleeding for a few days.  Do not douche, use tampons, or have sexual intercourse until your health care provider approves. Ask your health care provider which activities are safe for you. This information is not intended to replace advice given to you by your health care provider. Make sure you discuss any questions you have with your health care provider. Document Revised: 02/23/2020 Document Reviewed: 02/23/2020 Elsevier Patient Education  2021 [employer].

## 2021-01-03 NOTE — Telephone Encounter (Signed)
Patient called requesting a call back to set an appt for back pains with Dr. Alvester Morin. Please call patient about this matter at [phone].

## 2021-01-03 NOTE — Telephone Encounter (Signed)
Called patient to schedule office visit with Dr. Alvester Morin. No answer and no voicemail after multiple rings.

## 2021-01-04 LAB — CERVICOVAGINAL ANCILLARY ONLY
Bacterial Vaginitis (gardnerella): NEGATIVE
Candida Glabrata: NEGATIVE
Candida Vaginitis: NEGATIVE
Chlamydia: NEGATIVE
Comment: NEGATIVE
Comment: NEGATIVE
Comment: NEGATIVE
Comment: NEGATIVE
Comment: NEGATIVE
Comment: NORMAL
Neisseria Gonorrhea: NEGATIVE
Trichomonas: NEGATIVE

## 2021-01-04 LAB — HIV ANTIBODY (ROUTINE TESTING W REFLEX): HIV Screen 4th Generation wRfx: NONREACTIVE

## 2021-01-04 LAB — HEPATITIS C ANTIBODY: Hep C Virus Ab: <0.1 s/co ratio (ref 0.0–0.9)

## 2021-01-04 LAB — HEPATITIS B SURFACE ANTIGEN: Hepatitis B Surface Ag: NEGATIVE

## 2021-01-04 NOTE — Telephone Encounter (Signed)
Called patient again. No answer and no voicemail.

## 2021-01-05 DIAGNOSIS — F4310 Post-traumatic stress disorder, unspecified: Secondary | ICD-10-CM | POA: Diagnosis not present

## 2021-01-05 DIAGNOSIS — E119 Type 2 diabetes mellitus without complications: Secondary | ICD-10-CM | POA: Diagnosis not present

## 2021-01-05 DIAGNOSIS — F329 Major depressive disorder, single episode, unspecified: Secondary | ICD-10-CM | POA: Diagnosis not present

## 2021-01-05 LAB — URINE CULTURE

## 2021-01-06 ENCOUNTER — Telehealth: Payer: Self-pay

## 2021-01-06 NOTE — Telephone Encounter (Signed)
RTC to Pt regarding results  Pt voiced understanding.

## 2021-01-07 ENCOUNTER — Telehealth: Payer: Self-pay

## 2021-01-07 ENCOUNTER — Encounter: Payer: Self-pay | Admitting: Orthopaedic Surgery

## 2021-01-07 ENCOUNTER — Ambulatory Visit (INDEPENDENT_AMBULATORY_CARE_PROVIDER_SITE_OTHER): Payer: Medicare HMO | Admitting: Orthopaedic Surgery

## 2021-01-07 VITALS — Ht 60.0 in | Wt 191.8 lb

## 2021-01-07 DIAGNOSIS — M25511 Pain in right shoulder: Secondary | ICD-10-CM | POA: Diagnosis not present

## 2021-01-07 DIAGNOSIS — M1711 Unilateral primary osteoarthritis, right knee: Secondary | ICD-10-CM | POA: Diagnosis not present

## 2021-01-07 DIAGNOSIS — M1712 Unilateral primary osteoarthritis, left knee: Secondary | ICD-10-CM | POA: Diagnosis not present

## 2021-01-07 DIAGNOSIS — G8929 Other chronic pain: Secondary | ICD-10-CM | POA: Diagnosis not present

## 2021-01-07 DIAGNOSIS — M170 Bilateral primary osteoarthritis of knee: Secondary | ICD-10-CM

## 2021-01-07 NOTE — Telephone Encounter (Signed)
Noted  

## 2021-01-07 NOTE — Telephone Encounter (Signed)
Please submit for BIL knee gel injs.

## 2021-01-07 NOTE — Progress Notes (Signed)
Office Visit Note   Patient: Hannah Young           Date of Birth: 22-Apr-1966           MRN: [id number] Visit Date: 01/07/2021              Requested by: Hannah Bottom, MD [address] [place],  [place] [address] PCP: Hannah Bottom, MD   Assessment & Plan: Visit Diagnoses:  1. Chronic right shoulder pain   2. Primary osteoarthritis of right knee   3. Primary osteoarthritis of left knee     Plan: In terms of the knees she has end-stage DJD and we will get approval for Visco injections.  For the right shoulder given failure of conservative treatments we will need to assess for structural abnormalities with MR arthrogram.  Follow-up after the MRI and for Visco injections.  Follow-Up Instructions: Return for Follow-up for Visco injections.   Orders:  Orders Placed This Encounter  Procedures  . MR SHOULDER RIGHT W CONTRAST  . Arthrogram   No orders of the defined types were placed in this encounter.     Procedures: No procedures performed   Clinical Data: No additional findings.   Subjective: Chief Complaint  Patient presents with  . Right Knee - Pain  . Right Shoulder - Pain    Hannah Young returns today for chronic bilateral knee pain due to DJD as well as continued right shoulder pain.  She continues to deal with liver cancer and she is currently taking prednisone and chemo.  Her A1c is 9.2.  She has had tough time managing her diabetes because of the chronic prednisone that she has to take.  She did physical therapy for the right shoulder in the past without any significant relief.  She has deep-seated shoulder pain with any range of motion.  Bilateral knees continue to be symptomatic due to end-stage DJD.  She has not had any Visco injections in the past and she is interested in trying these.   Review of Systems  Constitutional: Negative.   HENT: Negative.   Eyes: Negative.   Respiratory: Negative.   Cardiovascular: Negative.   Endocrine: Negative.    Musculoskeletal: Negative.   Neurological: Negative.   Hematological: Negative.   Psychiatric/Behavioral: Negative.   All other systems reviewed and are negative.    Objective: Vital Signs: Ht 5' (1.524 m)   Wt 191 lb 12.8 oz (87 kg)   LMP 12/16/2020   BMI 37.46 kg/m   Physical Exam Vitals and nursing note reviewed.  Constitutional:      Appearance: She is well-developed.  Pulmonary:     Effort: Pulmonary effort is normal.  Skin:    General: Skin is warm.     Capillary Refill: Capillary refill takes less than 2 seconds.  Neurological:     Mental Status: She is alert and oriented to person, place, and time.  Psychiatric:        Behavior: Behavior normal.        Thought Content: Thought content normal.        Judgment: Judgment normal.     Ortho Exam Bilateral knee exams are unchanged.  Pain and crepitus with range of motion. Right shoulder shows pain with abduction past 90 degrees.  Positive Hawkins.  Strength is grossly intact and normal to manual muscle testing. Specialty Comments:  No specialty comments available.  Imaging: No results found.   PMFS History: Patient Active Problem List   Diagnosis Date  Noted  . Chronic right shoulder pain 06/02/2020  . Bilateral occipital neuralgia 05/01/2020  . Chronic neck pain 05/01/2020  . Osteoarthritis 02/12/2020  . Healthcare maintenance 04/29/2018  . Multiple lung nodules on CT 01/25/2017  . Depression 01/25/2017  . Autoimmune hepatitis (HCC) 01/30/2012  . Diabetes mellitus (HCC) 01/30/2012  . Asthma 01/30/2012  . Hypertension 01/30/2012   Past Medical History:  Diagnosis Date  . Anxiety   . Arthritis   . Asthma   . Autoimmune hepatitis (HCC)   . Bipolar 1 disorder (HCC)   . Breast lump   . Depression   . Diabetes mellitus   . FH: chemotherapy   . Gout   . High cholesterol   . Hypertension   . Liver cirrhosis (HCC)   . Lung nodules   . Neuropathy   . Panic disorder   . Vaginal discharge 01/25/2017   . Vertigo     Family History  Problem Relation Age of Onset  . Liver disease Mother   . Kidney failure Father   . Heart disease Sister   . Liver disease Daughter   . Anxiety disorder Daughter   . Heart disease Son   . Colon cancer Neg Hx   . Stomach cancer Neg Hx   . Migraines Neg Hx   . Headache Neg Hx     Past Surgical History:  Procedure Laterality Date  . Biopsy of liver    . CESAREAN SECTION    . LAPAROSCOPY    . TUBAL LIGATION     Social History   Occupational History  . Not on file  Tobacco Use  . Smoking status: Former Smoker    Packs/day: 0.30    Types: Cigarettes    Quit date: 08/22/2020    Years since quitting: 0.3  . Smokeless tobacco: Never Used  . Tobacco comment: 2-3 cigs/day  Vaping Use  . Vaping Use: Never used  Substance and Sexual Activity  . Alcohol use: Not Currently    Alcohol/week: 3.0 standard drinks    Types: 3 Cans of beer per week  . Drug use: No  . Sexual activity: Not Currently    Birth control/protection: Surgical    Comment: States she is not currently active

## 2021-01-11 ENCOUNTER — Ambulatory Visit (HOSPITAL_BASED_OUTPATIENT_CLINIC_OR_DEPARTMENT_OTHER)
Admission: RE | Admit: 2021-01-11 | Discharge: 2021-01-11 | Disposition: A | Discharge status: Home / Self Care | Payer: Medicare HMO | Source: Ambulatory Visit | Attending: Obstetrics | Admitting: Obstetrics

## 2021-01-11 ENCOUNTER — Other Ambulatory Visit: Payer: Self-pay

## 2021-01-11 ENCOUNTER — Telehealth: Payer: Self-pay | Admitting: Physical Medicine and Rehabilitation

## 2021-01-11 DIAGNOSIS — N8500 Endometrial hyperplasia, unspecified: Secondary | ICD-10-CM | POA: Diagnosis not present

## 2021-01-11 DIAGNOSIS — R102 Pelvic and perineal pain: Secondary | ICD-10-CM | POA: Insufficient documentation

## 2021-01-11 NOTE — Telephone Encounter (Signed)
Patient called needing to schedule an appointment with Dr Alvester Morin for her neck and spine. The number to contact patient is [phone]

## 2021-01-11 NOTE — Telephone Encounter (Signed)
Scheduled for 6/15 at 0930. 

## 2021-01-11 NOTE — Telephone Encounter (Signed)
See previous message

## 2021-01-17 ENCOUNTER — Telehealth: Payer: Self-pay

## 2021-01-17 NOTE — Telephone Encounter (Signed)
Patient calling about ultrasound on 01/11/2021.

## 2021-01-20 ENCOUNTER — Other Ambulatory Visit: Payer: Medicare HMO

## 2021-01-24 ENCOUNTER — Other Ambulatory Visit: Payer: Medicare HMO | Admitting: Obstetrics

## 2021-01-25 ENCOUNTER — Other Ambulatory Visit: Payer: Medicare HMO

## 2021-01-25 ENCOUNTER — Inpatient Hospital Stay: Admission: RE | Admit: 2021-01-25 | Payer: Medicare HMO | Source: Ambulatory Visit

## 2021-01-26 ENCOUNTER — Ambulatory Visit: Payer: Medicare HMO | Admitting: Physical Medicine and Rehabilitation

## 2021-01-26 ENCOUNTER — Telehealth: Payer: Self-pay

## 2021-01-26 NOTE — Telephone Encounter (Signed)
Hannah Young with [name] requesting to speak with a nurse about meds. Please call back.

## 2021-01-26 NOTE — Telephone Encounter (Signed)
Return call to [facility] - the pharmacist stated people who are taking diabetic medications are usually placed on a cholesterol lowering agent. Please send rx if appropriate. Thanks

## 2021-01-26 NOTE — Telephone Encounter (Signed)
Received message that [name]' pharmacy has called to request cholesterol-lowering agent for her diabetes. Her last lipid panel was in 2011. Will hold off starting any cholesterol medications at this time, can repeat lipid panel at patient's next visit.

## 2021-01-27 ENCOUNTER — Ambulatory Visit: Payer: Medicare HMO | Admitting: Orthopaedic Surgery

## 2021-01-27 DIAGNOSIS — Z1152 Encounter for screening for COVID-19: Secondary | ICD-10-CM | POA: Diagnosis not present

## 2021-01-31 ENCOUNTER — Ambulatory Visit (INDEPENDENT_AMBULATORY_CARE_PROVIDER_SITE_OTHER): Payer: Medicare HMO | Admitting: Internal Medicine

## 2021-01-31 ENCOUNTER — Telehealth: Payer: Self-pay | Admitting: *Deleted

## 2021-01-31 DIAGNOSIS — L659 Nonscarring hair loss, unspecified: Secondary | ICD-10-CM | POA: Diagnosis not present

## 2021-01-31 DIAGNOSIS — G8929 Other chronic pain: Secondary | ICD-10-CM

## 2021-01-31 DIAGNOSIS — R519 Headache, unspecified: Secondary | ICD-10-CM | POA: Diagnosis not present

## 2021-01-31 NOTE — Telephone Encounter (Signed)
Patient called in c/o hair loss that she says in unrelated to chemo. Also, c/o intense scalp itching, eyes burning, dizziness, falls, some SHOB with little relief from inhaler, fatigue. Her landlord has identified mold in her apt and she thinks this is the cause of her sx. Patient has no transportation to come to clinic today. Tele appt given with [facility] today at 3:45.

## 2021-01-31 NOTE — Progress Notes (Signed)
  [facility] Internal [facility] Residency Telephone Encounter Continuity Care Appointment  HPI:  This telephone encounter was created for [name] on 01/31/2021 for the following purpose/cc hair loss, itching, falls, and fatigue.   Past Medical History:  Past Medical History:  Diagnosis Date   Anxiety    Arthritis    Asthma    Autoimmune hepatitis (HCC)    Bipolar 1 disorder (HCC)    Breast lump    Depression    Diabetes mellitus    FH: chemotherapy    Gout    High cholesterol    Hypertension    Liver cirrhosis (HCC)    Lung nodules    Neuropathy    Panic disorder    Vaginal discharge 01/25/2017   Vertigo      ROS:  Review of Systems  Constitutional:  Positive for malaise/fatigue. Negative for chills and fever.  HENT:  Positive for congestion, sinus pain and sore throat.   Eyes:  Positive for pain.  Respiratory:  Positive for cough, sputum production and shortness of breath.   Cardiovascular:  Negative for chest pain.  Gastrointestinal:  Positive for abdominal pain, constipation, nausea and vomiting.  Musculoskeletal:  Positive for falls, joint pain and myalgias.  Skin:  Positive for itching.  Neurological:  Positive for dizziness and headaches. Negative for weakness.     Assessment / Plan / Recommendations:  Please see A&P under problem oriented charting for assessment of the patient's acute and chronic medical conditions.  As always, pt is advised that if symptoms worsen or new symptoms arise, they should go to an urgent care facility or to to ER for further evaluation.   Consent and Medical Decision Making:  Patient discussed with Dr. Heide Spark This is a telephone encounter between Hannah Young and Audine Mangione N Culver Feighner on 01/31/2021 for hair loss, itching, falls, fatigue and a variety of other symptoms. The visit was conducted with the patient located at home and Shirla Hodgkiss N Roxy Mastandrea at [facility]. The patient's identity was confirmed using their DOB and current address. The  patient has consented to being evaluated through a telephone encounter and understands the associated risks (an examination cannot be done and the patient may need to come in for an appointment) / benefits (allows the patient to remain at home, decreasing exposure to coronavirus). I personally spent 24 minutes on medical discussion.

## 2021-02-01 DIAGNOSIS — R519 Headache, unspecified: Secondary | ICD-10-CM

## 2021-02-01 HISTORY — DX: Headache, unspecified: R51.9

## 2021-02-01 NOTE — Assessment & Plan Note (Signed)
Patient scheduled an appointment today to discuss her progressive hair loss the past year.  She states that this is very different from the hair loss she experienced from starting her chemotherapy for her disease.  She states that she has a large bald spot but now she is experiencing significant hair breakage on both her scalp and genital hairs.  She states that her scalp is also itching tremendously.  States in the past she has been treated with antifungal creams however this feels different.  Discussed that it would be important to evaluate her in person.  Patient expresses understanding and will schedule next available appointment for when she has a ride.  Assessment/plan: Previously worked up for thyroid disease which was normal.  This may be due to her medication versus age-related as previously discussed.  Patient is to follow-up in clinic in person when she is able to arrange a ride.

## 2021-02-01 NOTE — Assessment & Plan Note (Signed)
Patient also complains of a headache, dizziness, falls, her eyes are burning, shortness of breath, cough, nausea and vomiting and abdominal pain for the past year intermittently.  She states that these symptoms have been going on at least since November.  States that she usually feels dizzy before her falls but has not lost consciousness or hit her head.  States that she is embarrassed to go out in public now between her hair loss and due to the pain that she experiences from her eyes burning.  She mentions that she recently found mold in her home and has not been able to resolve this issue.  The state informed her that this is not their issue and she would have to handle this personally.  Patient states that she cannot afford anyone to remove the mold and has no for her to relocate currently.  Given the variety and extensiveness of her symptoms discussed that it is unlikely this is related to mold exposure but that may be playing a role.  Discussed that with her autoimmune hepatitis that she may be close to other autoimmune conditions.  She has been worked up for lupus in the past and this was negative.  Discussed that I would like to evaluate her in person.  Patient expresses understanding and will schedule an appointment when she is able to arrange a ride.  Can consider further autoimmune work-up pending physical examination.

## 2021-02-02 NOTE — Progress Notes (Signed)
Internal Medicine Clinic Attending ° °Case discussed with Dr. Rehman  At the time of the visit.  We reviewed the resident’s history and exam and pertinent patient test results.  I agree with the assessment, diagnosis, and plan of care documented in the resident’s note.  ° °

## 2021-02-03 ENCOUNTER — Encounter: Payer: Medicare HMO | Admitting: Internal Medicine

## 2021-02-04 ENCOUNTER — Telehealth: Payer: Self-pay

## 2021-02-04 ENCOUNTER — Emergency Department (HOSPITAL_COMMUNITY)
Admission: EM | Admit: 2021-02-04 | Discharge: 2021-02-04 | Disposition: A | Discharge status: Home / Self Care | Payer: Medicare HMO | Attending: Emergency Medicine | Admitting: Emergency Medicine

## 2021-02-04 DIAGNOSIS — Y93G1 Activity, food preparation and clean up: Secondary | ICD-10-CM | POA: Diagnosis not present

## 2021-02-04 DIAGNOSIS — T310 Burns involving less than 10% of body surface: Secondary | ICD-10-CM | POA: Insufficient documentation

## 2021-02-04 DIAGNOSIS — T2022XA Burn of second degree of lip(s), initial encounter: Secondary | ICD-10-CM | POA: Insufficient documentation

## 2021-02-04 DIAGNOSIS — Z23 Encounter for immunization: Secondary | ICD-10-CM | POA: Diagnosis not present

## 2021-02-04 DIAGNOSIS — F10929 Alcohol use, unspecified with intoxication, unspecified: Secondary | ICD-10-CM | POA: Diagnosis not present

## 2021-02-04 DIAGNOSIS — E1140 Type 2 diabetes mellitus with diabetic neuropathy, unspecified: Secondary | ICD-10-CM | POA: Insufficient documentation

## 2021-02-04 DIAGNOSIS — T2024XA Burn of second degree of nose (septum), initial encounter: Secondary | ICD-10-CM | POA: Diagnosis not present

## 2021-02-04 DIAGNOSIS — T2020XA Burn of second degree of head, face, and neck, unspecified site, initial encounter: Secondary | ICD-10-CM | POA: Insufficient documentation

## 2021-02-04 DIAGNOSIS — T2602XA Burn of left eyelid and periocular area, initial encounter: Secondary | ICD-10-CM | POA: Diagnosis not present

## 2021-02-04 DIAGNOSIS — X102XXA Contact with fats and cooking oils, initial encounter: Secondary | ICD-10-CM | POA: Diagnosis not present

## 2021-02-04 DIAGNOSIS — I10 Essential (primary) hypertension: Secondary | ICD-10-CM | POA: Diagnosis not present

## 2021-02-04 DIAGNOSIS — T2000XA Burn of unspecified degree of head, face, and neck, unspecified site, initial encounter: Secondary | ICD-10-CM | POA: Diagnosis present

## 2021-02-04 DIAGNOSIS — R000 Tachycardia, unspecified: Secondary | ICD-10-CM | POA: Diagnosis not present

## 2021-02-04 DIAGNOSIS — T2601XA Burn of right eyelid and periocular area, initial encounter: Secondary | ICD-10-CM | POA: Diagnosis not present

## 2021-02-04 DIAGNOSIS — T20211A Burn of second degree of right ear [any part, except ear drum], initial encounter: Secondary | ICD-10-CM | POA: Insufficient documentation

## 2021-02-04 DIAGNOSIS — Z91040 Latex allergy status: Secondary | ICD-10-CM | POA: Insufficient documentation

## 2021-02-04 DIAGNOSIS — Z79899 Other long term (current) drug therapy: Secondary | ICD-10-CM | POA: Insufficient documentation

## 2021-02-04 DIAGNOSIS — J45909 Unspecified asthma, uncomplicated: Secondary | ICD-10-CM | POA: Insufficient documentation

## 2021-02-04 DIAGNOSIS — Z794 Long term (current) use of insulin: Secondary | ICD-10-CM | POA: Insufficient documentation

## 2021-02-04 DIAGNOSIS — Z87891 Personal history of nicotine dependence: Secondary | ICD-10-CM | POA: Insufficient documentation

## 2021-02-04 MED ORDER — FLUORESCEIN SODIUM 1 MG OP STRP
1.0000 | ORAL_STRIP | Freq: Once | OPHTHALMIC | Status: AC
  Administered 2021-02-04: 1 via OPHTHALMIC
  Filled 2021-02-04: qty 1

## 2021-02-04 MED ORDER — BACITRACIN ZINC 500 UNIT/GM EX OINT: 1.0000 "application " | TOPICAL_OINTMENT | Freq: Two times a day (BID) | CUTANEOUS | 0 refills | Status: DC

## 2021-02-04 MED ORDER — BACITRACIN-POLYMYXIN B 500-10000 UNIT/GM OP OINT
TOPICAL_OINTMENT | Freq: Two times a day (BID) | OPHTHALMIC | Status: DC
  Filled 2021-02-04 (×2): qty 3.5

## 2021-02-04 MED ORDER — BACITRACIN 500 UNIT/GM EX OINT
TOPICAL_OINTMENT | Freq: Two times a day (BID) | CUTANEOUS | Status: DC
  Administered 2021-02-04: 1 via TOPICAL
  Filled 2021-02-04: qty 28

## 2021-02-04 MED ORDER — OXYCODONE HCL 5 MG PO TABS: 2.5000 mg | ORAL_TABLET | Freq: Four times a day (QID) | ORAL | 0 refills | Status: AC | PRN

## 2021-02-04 MED ORDER — TETANUS-DIPHTH-ACELL PERTUSSIS 5-2.5-18.5 LF-MCG/0.5 IM SUSY
0.5000 mL | PREFILLED_SYRINGE | Freq: Once | INTRAMUSCULAR | Status: AC
  Administered 2021-02-04: 0.5 mL via INTRAMUSCULAR
  Filled 2021-02-04: qty 0.5

## 2021-02-04 MED ORDER — BACITRACIN ZINC 500 UNIT/GM EX OINT
TOPICAL_OINTMENT | Freq: Two times a day (BID) | CUTANEOUS | Status: DC
  Filled 2021-02-04: qty 1.8

## 2021-02-04 MED ORDER — OXYCODONE HCL 5 MG PO TABS
5.0000 mg | ORAL_TABLET | Freq: Once | ORAL | Status: AC
Start: 2021-02-04 — End: 2021-02-04
  Administered 2021-02-04: 5 mg via ORAL
  Filled 2021-02-04: qty 1

## 2021-02-04 NOTE — Telephone Encounter (Signed)
VOB has been submitted for Monovisc, bilateral knee. Pending BV. 

## 2021-02-04 NOTE — ED Notes (Signed)
Pt reports that her vision in her right eye is blurry, she is now c/o pain 10/10.
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
## 2021-02-04 NOTE — ED Notes (Signed)
Woods lamp bedside.
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
## 2021-02-05 DIAGNOSIS — Z1152 Encounter for screening for COVID-19: Secondary | ICD-10-CM | POA: Diagnosis not present

## 2021-02-08 ENCOUNTER — Telehealth: Payer: Self-pay

## 2021-02-08 NOTE — Telephone Encounter (Signed)
Patient is aware that she is approved for Monovisc, bilateral knee. Patient will call back to schedule when she is ready to proceed.  Approved, Monovisc, bilateral knee. Buy & Bill Covered at 100% through her insurance. No Co-pay [place] Approval# [id number] Valid 02/08/2021- 05/09/2021

## 2021-02-10 ENCOUNTER — Other Ambulatory Visit: Payer: Medicare HMO

## 2021-02-10 DIAGNOSIS — Z1152 Encounter for screening for COVID-19: Secondary | ICD-10-CM | POA: Diagnosis not present

## 2021-02-11 ENCOUNTER — Ambulatory Visit: Payer: Medicare HMO

## 2021-02-16 ENCOUNTER — Ambulatory Visit: Payer: Medicare HMO | Admitting: Student

## 2021-02-17 DIAGNOSIS — Z1152 Encounter for screening for COVID-19: Secondary | ICD-10-CM | POA: Diagnosis not present

## 2021-02-23 ENCOUNTER — Ambulatory Visit (INDEPENDENT_AMBULATORY_CARE_PROVIDER_SITE_OTHER): Payer: Medicare HMO | Admitting: Internal Medicine

## 2021-02-23 ENCOUNTER — Other Ambulatory Visit (HOSPITAL_COMMUNITY)
Admission: RE | Admit: 2021-02-23 | Discharge: 2021-02-23 | Disposition: A | Discharge status: Home / Self Care | Payer: Medicare HMO | Source: Ambulatory Visit | Attending: Internal Medicine | Admitting: Internal Medicine

## 2021-02-23 ENCOUNTER — Other Ambulatory Visit: Payer: Self-pay

## 2021-02-23 VITALS — BP 117/101 | HR 80 | Temp 98.1°F | Ht 61.0 in | Wt 191.4 lb

## 2021-02-23 DIAGNOSIS — Z202 Contact with and (suspected) exposure to infections with a predominantly sexual mode of transmission: Secondary | ICD-10-CM | POA: Diagnosis not present

## 2021-02-23 DIAGNOSIS — T2000XA Burn of unspecified degree of head, face, and neck, unspecified site, initial encounter: Secondary | ICD-10-CM | POA: Insufficient documentation

## 2021-02-23 DIAGNOSIS — Z113 Encounter for screening for infections with a predominantly sexual mode of transmission: Secondary | ICD-10-CM | POA: Diagnosis not present

## 2021-02-23 DIAGNOSIS — Z1152 Encounter for screening for COVID-19: Secondary | ICD-10-CM | POA: Diagnosis not present

## 2021-02-23 HISTORY — DX: Contact with and (suspected) exposure to infections with a predominantly sexual mode of transmission: Z20.2

## 2021-02-23 MED ORDER — OXYCODONE HCL 5 MG PO TABS: 5.0000 mg | ORAL_TABLET | Freq: Three times a day (TID) | ORAL | 0 refills | Status: DC | PRN

## 2021-02-23 NOTE — Assessment & Plan Note (Addendum)
Patient states that she is unsure of her partners sexual activities and believes she could have been exposed to an STD.  Patient denies any symptoms at this time, however, she would still like tested.  Plan: - HIV and RPR test - Self swab performed in clinic and will test for gonorrhea, chlamydia, trich, BV, and candida   Addendum: All test results came back negative. Patient called and made aware of the results.
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
## 2021-02-24 ENCOUNTER — Telehealth: Payer: Self-pay

## 2021-02-24 LAB — CERVICOVAGINAL ANCILLARY ONLY
Bacterial Vaginitis (gardnerella): NEGATIVE
Candida Glabrata: NEGATIVE
Candida Vaginitis: NEGATIVE
Chlamydia: NEGATIVE
Comment: NEGATIVE
Comment: NEGATIVE
Comment: NEGATIVE
Comment: NEGATIVE
Comment: NEGATIVE
Comment: NORMAL
Neisseria Gonorrhea: NEGATIVE
Trichomonas: NEGATIVE

## 2021-02-24 LAB — HIV ANTIBODY (ROUTINE TESTING W REFLEX): HIV Screen 4th Generation wRfx: NONREACTIVE

## 2021-02-24 LAB — RPR: RPR Ser Ql: NONREACTIVE

## 2021-02-24 NOTE — Telephone Encounter (Signed)
Patient called and made aware of her test results.

## 2021-02-24 NOTE — Telephone Encounter (Signed)
Pt is requesting a call back .. she is wanting to know if her test results  done yesterday morning 02/23/21 .Marland Kitchen

## 2021-03-01 ENCOUNTER — Ambulatory Visit: Payer: Medicare HMO | Admitting: Orthopaedic Surgery

## 2021-03-03 ENCOUNTER — Encounter (HOSPITAL_COMMUNITY): Payer: Self-pay | Admitting: Emergency Medicine

## 2021-03-03 ENCOUNTER — Emergency Department (HOSPITAL_COMMUNITY): Payer: Medicare HMO

## 2021-03-03 ENCOUNTER — Emergency Department (HOSPITAL_COMMUNITY)
Admission: EM | Admit: 2021-03-03 | Discharge: 2021-03-04 | Disposition: A | Discharge status: Home / Self Care | Payer: Medicare HMO | Attending: Emergency Medicine | Admitting: Emergency Medicine

## 2021-03-03 ENCOUNTER — Other Ambulatory Visit: Payer: Self-pay

## 2021-03-03 DIAGNOSIS — E119 Type 2 diabetes mellitus without complications: Secondary | ICD-10-CM | POA: Diagnosis not present

## 2021-03-03 DIAGNOSIS — K920 Hematemesis: Secondary | ICD-10-CM | POA: Diagnosis not present

## 2021-03-03 DIAGNOSIS — J45909 Unspecified asthma, uncomplicated: Secondary | ICD-10-CM | POA: Diagnosis not present

## 2021-03-03 DIAGNOSIS — Z91040 Latex allergy status: Secondary | ICD-10-CM | POA: Diagnosis not present

## 2021-03-03 DIAGNOSIS — Z87891 Personal history of nicotine dependence: Secondary | ICD-10-CM | POA: Insufficient documentation

## 2021-03-03 DIAGNOSIS — Z79899 Other long term (current) drug therapy: Secondary | ICD-10-CM | POA: Diagnosis not present

## 2021-03-03 DIAGNOSIS — R519 Headache, unspecified: Secondary | ICD-10-CM | POA: Insufficient documentation

## 2021-03-03 DIAGNOSIS — G4489 Other headache syndrome: Secondary | ICD-10-CM | POA: Diagnosis not present

## 2021-03-03 DIAGNOSIS — K759 Inflammatory liver disease, unspecified: Secondary | ICD-10-CM | POA: Diagnosis not present

## 2021-03-03 DIAGNOSIS — R040 Epistaxis: Secondary | ICD-10-CM | POA: Insufficient documentation

## 2021-03-03 DIAGNOSIS — R1111 Vomiting without nausea: Secondary | ICD-10-CM | POA: Diagnosis not present

## 2021-03-03 DIAGNOSIS — Z794 Long term (current) use of insulin: Secondary | ICD-10-CM | POA: Diagnosis not present

## 2021-03-03 DIAGNOSIS — I10 Essential (primary) hypertension: Secondary | ICD-10-CM | POA: Diagnosis not present

## 2021-03-03 DIAGNOSIS — G8929 Other chronic pain: Secondary | ICD-10-CM

## 2021-03-03 DIAGNOSIS — R112 Nausea with vomiting, unspecified: Secondary | ICD-10-CM

## 2021-03-03 DIAGNOSIS — Z1152 Encounter for screening for COVID-19: Secondary | ICD-10-CM | POA: Diagnosis not present

## 2021-03-03 LAB — CBC WITH DIFFERENTIAL/PLATELET
Abs Immature Granulocytes: 0.03 10*3/uL (ref 0.00–0.07)
Basophils Absolute: 0.1 10*3/uL (ref 0.0–0.1)
Basophils Relative: 1 %
Eosinophils Absolute: 0.2 10*3/uL (ref 0.0–0.5)
Eosinophils Relative: 2 %
HCT: 37.7 % (ref 36.0–46.0)
Hemoglobin: 12.4 g/dL (ref 12.0–15.0)
Immature Granulocytes: 0 %
Lymphocytes Relative: 34 %
Lymphs Abs: 3.3 10*3/uL (ref 0.7–4.0)
MCH: 34.1 pg — ABNORMAL HIGH (ref 26.0–34.0)
MCHC: 32.9 g/dL (ref 30.0–36.0)
MCV: 103.6 fL — ABNORMAL HIGH (ref 80.0–100.0)
Monocytes Absolute: 1 10*3/uL (ref 0.1–1.0)
Monocytes Relative: 10 %
Neutro Abs: 5 10*3/uL (ref 1.7–7.7)
Neutrophils Relative %: 53 %
Platelets: 416 10*3/uL — ABNORMAL HIGH (ref 150–400)
RBC: 3.64 MIL/uL — ABNORMAL LOW (ref 3.87–5.11)
RDW: 13.9 % (ref 11.5–15.5)
WBC: 9.6 10*3/uL (ref 4.0–10.5)
nRBC: 0 % (ref 0.0–0.2)

## 2021-03-03 LAB — COMPREHENSIVE METABOLIC PANEL
ALT: 332 U/L — ABNORMAL HIGH (ref 0–44)
AST: 385 U/L — ABNORMAL HIGH (ref 15–41)
Albumin: 4 g/dL (ref 3.5–5.0)
Alkaline Phosphatase: 156 U/L — ABNORMAL HIGH (ref 38–126)
Anion gap: 9 (ref 5–15)
BUN: 15 mg/dL (ref 6–20)
CO2: 26 mmol/L (ref 22–32)
Calcium: 9.3 mg/dL (ref 8.9–10.3)
Chloride: 105 mmol/L (ref 98–111)
Creatinine, Ser: 0.74 mg/dL (ref 0.44–1.00)
GFR, Estimated: >60 mL/min (ref >60)
Glucose, Bld: 128 mg/dL — ABNORMAL HIGH (ref 70–99)
Potassium: 3.5 mmol/L (ref 3.5–5.1)
Sodium: 140 mmol/L (ref 135–145)
Total Bilirubin: 0.6 mg/dL (ref 0.3–1.2)
Total Protein: 9 g/dL — ABNORMAL HIGH (ref 6.5–8.1)

## 2021-03-03 LAB — LIPASE, BLOOD: Lipase: 36 U/L (ref 11–51)

## 2021-03-03 NOTE — ED Provider Notes (Signed)
Emergency Medicine Provider Triage Evaluation Note  Hannah Young , a 55 y.o. female  was evaluated in triage.  Pt complains of chronic headaches.  States she has been experiencing these headaches for the past year.  States she has been evaluated by her PCP for this.  Also reports increase in nosebleeds as well as hematemesis over the past week.  Reports 2-3 episodes of coffee-ground emesis per day.  States that she has a history of liver cancer and currently takes oral chemotherapy daily.  Physical Exam  BP 126/89   Pulse (!) 109   Temp 98.2 F (36.8 C)   Resp 20   Ht 5' (1.524 m)   Wt 86.2 kg   SpO2 96%   BMI 37.11 kg/m  Gen:   Awake, no distress   Resp:  Normal effort  MSK:   Moves extremities without difficulty  Other:    Medical Decision Making  Medically screening exam initiated at 10:14 PM.  Appropriate orders placed.  Hannah Young was informed that the remainder of the evaluation will be completed by another provider, this initial triage assessment does not replace that evaluation, and the importance of remaining in the ED until their evaluation is complete.   Placido Sou, PA-C 03/03/21 2216    Hannah Distance, MD 03/03/21 [phone]

## 2021-03-03 NOTE — Progress Notes (Signed)
Internal Medicine Clinic Attending ° °I saw and evaluated the patient.  I personally confirmed the key portions of the history and exam documented by Dr. Atway and I reviewed pertinent patient test results.  The assessment, diagnosis, and plan were formulated together and I agree with the documentation in the resident’s note.  °

## 2021-03-03 NOTE — Addendum Note (Signed)
Addended by: Debe Coder B on: 03/03/2021 08:20 AM   Modules accepted: Level of Service

## 2021-03-03 NOTE — ED Triage Notes (Signed)
Per EMS, patient from home, c/o migraines with nosebleed and hair loss x1 year. Reports hx liver cancer. Reports nosebleeds and dark vomit today.

## 2021-03-04 ENCOUNTER — Encounter (HOSPITAL_COMMUNITY): Payer: Self-pay | Admitting: Emergency Medicine

## 2021-03-04 DIAGNOSIS — K759 Inflammatory liver disease, unspecified: Secondary | ICD-10-CM | POA: Diagnosis not present

## 2021-03-04 LAB — POC OCCULT BLOOD, ED: Fecal Occult Bld: NEGATIVE

## 2021-03-04 LAB — TYPE AND SCREEN
ABO/RH(D): O POS
Antibody Screen: NEGATIVE

## 2021-03-04 MED ORDER — ONDANSETRON 8 MG PO TBDP: ORAL_TABLET | ORAL | 0 refills | Status: DC

## 2021-03-04 MED ORDER — ALUM & MAG HYDROXIDE-SIMETH 200-200-20 MG/5ML PO SUSP
30.0000 mL | Freq: Once | ORAL | Status: AC
  Administered 2021-03-04: 30 mL via ORAL
  Filled 2021-03-04: qty 30

## 2021-03-04 MED ORDER — FAMOTIDINE 20 MG PO TABS: 20.0000 mg | ORAL_TABLET | Freq: Two times a day (BID) | ORAL | 0 refills | Status: DC

## 2021-03-04 MED ORDER — ONDANSETRON 8 MG PO TBDP
8.0000 mg | ORAL_TABLET | Freq: Once | ORAL | Status: AC
  Administered 2021-03-04: 8 mg via ORAL
  Filled 2021-03-04: qty 1
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
## 2021-03-07 ENCOUNTER — Other Ambulatory Visit: Payer: Self-pay | Admitting: Internal Medicine

## 2021-03-07 ENCOUNTER — Encounter: Payer: Medicare HMO | Admitting: Internal Medicine

## 2021-03-07 DIAGNOSIS — E118 Type 2 diabetes mellitus with unspecified complications: Secondary | ICD-10-CM

## 2021-03-07 DIAGNOSIS — Z794 Long term (current) use of insulin: Secondary | ICD-10-CM

## 2021-03-08 ENCOUNTER — Encounter: Payer: Self-pay | Admitting: Orthopaedic Surgery

## 2021-03-08 ENCOUNTER — Ambulatory Visit (INDEPENDENT_AMBULATORY_CARE_PROVIDER_SITE_OTHER): Payer: Medicare HMO | Admitting: Orthopaedic Surgery

## 2021-03-08 DIAGNOSIS — M170 Bilateral primary osteoarthritis of knee: Secondary | ICD-10-CM

## 2021-03-08 MED ORDER — HYALURONAN 88 MG/4ML IX SOSY
88.0000 mg | PREFILLED_SYRINGE | INTRA_ARTICULAR | Status: AC | PRN
  Administered 2021-03-08: 88 mg via INTRA_ARTICULAR

## 2021-03-08 MED ORDER — BUPIVACAINE HCL 0.25 % IJ SOLN
0.6600 mL | INTRAMUSCULAR | Status: AC | PRN
  Administered 2021-03-08: .66 mL via INTRA_ARTICULAR

## 2021-03-08 MED ORDER — LIDOCAINE HCL 1 % IJ SOLN
3.0000 mL | INTRAMUSCULAR | Status: AC | PRN
  Administered 2021-03-08: 3 mL
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
## 2021-03-09 ENCOUNTER — Encounter: Payer: Medicare HMO | Admitting: Student

## 2021-03-09 ENCOUNTER — Telehealth: Payer: Self-pay | Admitting: *Deleted

## 2021-03-09 DIAGNOSIS — Z1152 Encounter for screening for COVID-19: Secondary | ICD-10-CM | POA: Diagnosis not present

## 2021-03-09 NOTE — Telephone Encounter (Signed)
Called patient, unable to leave message. Patient phone is full.

## 2021-03-10 ENCOUNTER — Other Ambulatory Visit: Payer: Self-pay | Admitting: Internal Medicine

## 2021-03-10 DIAGNOSIS — Z794 Long term (current) use of insulin: Secondary | ICD-10-CM

## 2021-03-10 DIAGNOSIS — E118 Type 2 diabetes mellitus with unspecified complications: Secondary | ICD-10-CM

## 2021-03-11 ENCOUNTER — Telehealth: Payer: Self-pay | Admitting: Orthopaedic Surgery

## 2021-03-11 ENCOUNTER — Telehealth: Payer: Self-pay

## 2021-03-11 NOTE — Telephone Encounter (Signed)
Requesting to speak with a nurse about skin problem. Please call pt back.

## 2021-03-11 NOTE — Telephone Encounter (Signed)
RTC, patient is c/o itchy skin, coughing, h/a's, insomnia and states all of this began when she moved into her new residence approx 1.5years ago.  She states she "thinks there is mildew, mold, and lead in her home".  She is asking if we can test for this.  If we cannot, she is asking for a referral to someone who can. Will forward to PCP to advise. SChaplin, RN,BSN

## 2021-03-11 NOTE — Telephone Encounter (Signed)
Pt states she needs a walker that you can sent on. She wants to know if you can [facility]

## 2021-03-11 NOTE — Telephone Encounter (Signed)
faxed

## 2021-03-15 ENCOUNTER — Encounter: Payer: Self-pay | Admitting: Plastic Surgery

## 2021-03-15 ENCOUNTER — Ambulatory Visit (INDEPENDENT_AMBULATORY_CARE_PROVIDER_SITE_OTHER): Payer: Medicare HMO | Admitting: Plastic Surgery

## 2021-03-15 ENCOUNTER — Other Ambulatory Visit: Payer: Self-pay

## 2021-03-15 VITALS — BP 119/83 | HR 91 | Ht 61.0 in | Wt 190.0 lb

## 2021-03-15 DIAGNOSIS — T2020XA Burn of second degree of head, face, and neck, unspecified site, initial encounter: Secondary | ICD-10-CM

## 2021-03-15 NOTE — Telephone Encounter (Signed)
RTC to patient, she states she hasn't heard from Dr. Mcarthur Rossetti yet regarding message below.  Informed patient MD's generally have 48 hours to respond and Dr. Mcarthur Rossetti is inpatient, RN will resend message to PCP. SChaplin, RN,BSN
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
## 2021-03-15 NOTE — Telephone Encounter (Signed)
Requesting to speak with a nurse, please call pt back.  

## 2021-03-16 ENCOUNTER — Encounter: Payer: Medicare HMO | Admitting: Student

## 2021-03-16 NOTE — Telephone Encounter (Signed)
Pt in regards to her rash and needing to get blood work.Marland KitchenMarland KitchenMarland Kitchen

## 2021-03-16 NOTE — Telephone Encounter (Signed)
RTC, pt states she still hasn't heard back from Dr. Mcarthur Rossetti. Appt made with [facility] team/Dr. Cyndie Chime for 03/22/21 @ 1045 to discuss rash/bloodwork/and possible referral per pt request. SChaplin, RN,BSN

## 2021-03-17 DIAGNOSIS — Z1152 Encounter for screening for COVID-19: Secondary | ICD-10-CM | POA: Diagnosis not present

## 2021-03-18 ENCOUNTER — Other Ambulatory Visit: Payer: Self-pay | Admitting: Internal Medicine

## 2021-03-18 DIAGNOSIS — E118 Type 2 diabetes mellitus with unspecified complications: Secondary | ICD-10-CM

## 2021-03-18 DIAGNOSIS — Z794 Long term (current) use of insulin: Secondary | ICD-10-CM

## 2021-03-22 ENCOUNTER — Encounter: Payer: Self-pay | Admitting: Internal Medicine

## 2021-03-22 ENCOUNTER — Ambulatory Visit (INDEPENDENT_AMBULATORY_CARE_PROVIDER_SITE_OTHER): Payer: Medicare HMO | Admitting: Internal Medicine

## 2021-03-22 ENCOUNTER — Other Ambulatory Visit: Payer: Self-pay

## 2021-03-22 VITALS — BP 123/85 | HR 91 | Temp 98.1°F | Ht 61.0 in | Wt 189.8 lb

## 2021-03-22 DIAGNOSIS — Z599 Problem related to housing and economic circumstances, unspecified: Secondary | ICD-10-CM

## 2021-03-22 DIAGNOSIS — F32A Depression, unspecified: Secondary | ICD-10-CM

## 2021-03-22 DIAGNOSIS — M2550 Pain in unspecified joint: Secondary | ICD-10-CM | POA: Diagnosis not present

## 2021-03-22 DIAGNOSIS — L299 Pruritus, unspecified: Secondary | ICD-10-CM

## 2021-03-22 DIAGNOSIS — K754 Autoimmune hepatitis: Secondary | ICD-10-CM | POA: Diagnosis not present

## 2021-03-22 DIAGNOSIS — L659 Nonscarring hair loss, unspecified: Secondary | ICD-10-CM | POA: Diagnosis not present

## 2021-03-22 DIAGNOSIS — Z59819 Housing instability, housed unspecified: Secondary | ICD-10-CM

## 2021-03-22 HISTORY — DX: Housing instability, housed unspecified: Z59.819

## 2021-03-22 MED ORDER — HYDROXYZINE HCL 25 MG PO TABS: 25.0000 mg | ORAL_TABLET | Freq: Three times a day (TID) | ORAL | 0 refills | Status: DC | PRN

## 2021-03-22 NOTE — Patient Instructions (Signed)
Hannah Young, it was a pleasure seeing you today!  Today we discussed: Rash, hair loss, increased asthma symptoms, Fatigue, and trouble sleeping.  I've sent in a medication that should help with itching called hydoxyzine.  Please also follow-up with [facility] for your Liver disease.  This could be contributing to the itching you have been having. I also wrote a letter stating that your housing could be contributing to your worsening medical conditions. I would like to see you back in one month to see if the medication helped with the itching.  I have ordered the following labs today:  Lab Orders  No laboratory test(s) ordered today     Tests ordered today:  None  Referrals ordered today:   Referral Orders  No referral(s) requested today     I have ordered the following medication/changed the following medications:   Stop the following medications: Medications Discontinued During This Encounter  Medication Reason   diphenhydrAMINE (BENADRYL) 25 mg capsule Ineffective     Start the following medications: Meds ordered this encounter  Medications   hydrOXYzine (ATARAX/VISTARIL) 25 MG tablet    Sig: Take 1 tablet (25 mg total) by mouth 3 (three) times daily as needed.    Dispense:  30 tablet    Refill:  0     Follow-up:  1 month    Please make sure to arrive 15 minutes prior to your next appointment. If you arrive late, you may be asked to reschedule.   We look forward to seeing you next time. Please call our clinic at [phone] if you have any questions or concerns. The best time to call is Monday-Friday from 9am-4pm, but there is someone available 24/7. If after hours or the weekend, call the main hospital number and ask for the Internal Medicine Resident On-Call. If you need medication refills, please notify your pharmacy one week in advance and they will send [place] a request.  Thank you for letting us take part in your care. Wishing you the best!  Thank  you, Dr. [facility]
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
## 2021-03-22 NOTE — Assessment & Plan Note (Addendum)
Patient presents with history of autoimmune hepatitis, originally diagnosed in 2011.  She was on Prednisone and Mercaptopurine until a few months ago.  Patient reports self-discontinuing her Mercaptopurine 3 months ago due to feeling that it could be making her symptoms of fatigue and pruritus worse.  She states that she has not noticed an improvement with stopping the medication and has not been back to [facility] since discontinuing medication. In March 2022, she was recommended to have a liver transplant.  She is not interested in this option because she does not want to take someone's organs.  Patient states that she has had increased pruritus over the past 2 years that she thinks could be related to her housing.  In 07/2020, her total bilirubin wnl at 1.0 and direct bilirubin was slightly increased at 0.3. Patient has had difficulty sleeping due to pruritus.  She has been taking Benadryl TID without relief.  A/P: -Advised patient to call and get an appointment with [facility].  Her total bilirubin is not elevated, but direct bilirubin does make up more than 20% of total bilirubin.   -Consider CMP vs hepatitis panel at follow-up visit in one month if she has not called [facility] or symptoms have worsened. -Started hydroxyzine 25 mg PRN up to TID. -Will follow-up with patient in one month.

## 2021-03-23 NOTE — Assessment & Plan Note (Addendum)
PHQ9 SCORE ONLY 03/22/2021 10/20/2020 07/19/2020  PHQ-9 Total Score 23 23 11    -Will call patient back in regards to continued elevated PHQ-9 score.  On chart review, patient was previously referred to psychiatry in [place].   ADDENDUM Left a voicemail for patient to give clinic a call back. Will try again this afternoon. She has follow-up in one month.
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
## 2021-03-23 NOTE — Assessment & Plan Note (Addendum)
Patient with history of autoimmune hepatitis presents with polyarthalgia.  She states that the pain is worst at night and often keeps her awake.  She has had chronic right shoulder and bilateral knee pain, but now she has pain in hips and left shoulder as well.  She has tried ibuprofen and Voltaren gel without relief.  She has gone to PT without improvement. In the setting of her hepatitis condition, I think this could be related to an autoimmune condition. She has previously been worked up for lupus and that was negative.  Patient has been to ortho about her knee and they have been doing Monovisc injections to both knees.  The injections have helped somewhat. Ms. Stratton is concerned that her symptoms could be related to her housing situation. Assessment/Plan: -letter written for patient to give to [employer] to have house inspected. -Consider repeating inflammatory markers or rheumatology referral at next visit in one month.

## 2021-03-23 NOTE — Assessment & Plan Note (Signed)
Patient complains of continued hair loss.  She states that her scalp is constantly itching and often has a burning sensation.  She has used antifungal creams before, but this itching and burning is different from previous episodes.  The hair loss has lead her to not like going out in public. A/P: Patient states that she has self-discontinued mercaptopurine a few months ago for her autoimmune hepatitis due to concern that it could be making her feel worse.  Itchiness and burning have not improved since then.  She also states that she has had continued hair loss and breakage.  She feels that her symptoms are related to mildew and mold in her house.  This could be the source of her symptoms, but I would also like to work-up further next month when patient returns.  Previously worked up thyroid disease which was wnl.  This could be age-related or possibly related to traction alopecia.  -Started hydroxyzine 25 mg prn for itching -Wrote letter for patient to give to [employer]

## 2021-03-24 ENCOUNTER — Ambulatory Visit: Payer: Medicare HMO

## 2021-03-25 DIAGNOSIS — Z1152 Encounter for screening for COVID-19: Secondary | ICD-10-CM | POA: Diagnosis not present

## 2021-03-30 DIAGNOSIS — Z1152 Encounter for screening for COVID-19: Secondary | ICD-10-CM | POA: Diagnosis not present

## 2021-04-03 ENCOUNTER — Other Ambulatory Visit: Payer: Self-pay

## 2021-04-03 ENCOUNTER — Ambulatory Visit (HOSPITAL_COMMUNITY)
Admission: EM | Admit: 2021-04-03 | Discharge: 2021-04-03 | Disposition: A | Discharge status: Home / Self Care | Payer: Medicare HMO | Attending: Internal Medicine | Admitting: Internal Medicine

## 2021-04-03 ENCOUNTER — Encounter (HOSPITAL_COMMUNITY): Payer: Self-pay | Admitting: *Deleted

## 2021-04-03 DIAGNOSIS — N898 Other specified noninflammatory disorders of vagina: Secondary | ICD-10-CM | POA: Insufficient documentation

## 2021-04-03 DIAGNOSIS — M62830 Muscle spasm of back: Secondary | ICD-10-CM | POA: Diagnosis not present

## 2021-04-03 DIAGNOSIS — L509 Urticaria, unspecified: Secondary | ICD-10-CM | POA: Diagnosis not present

## 2021-04-03 LAB — POCT URINALYSIS DIPSTICK, ED / UC
Glucose, UA: NEGATIVE mg/dL
Hgb urine dipstick: NEGATIVE
Leukocytes,Ua: NEGATIVE
Nitrite: NEGATIVE
Protein, ur: NEGATIVE mg/dL
Specific Gravity, Urine: 1.025 (ref 1.005–1.030)
Urobilinogen, UA: 2 mg/dL — ABNORMAL HIGH (ref 0.0–1.0)
pH: 5.5 (ref 5.0–8.0)

## 2021-04-03 LAB — HIV ANTIBODY (ROUTINE TESTING W REFLEX): HIV Screen 4th Generation wRfx: NONREACTIVE

## 2021-04-03 MED ORDER — TRIAMCINOLONE ACETONIDE 0.1 % EX CREA: 1.0000 "application " | TOPICAL_CREAM | Freq: Two times a day (BID) | CUTANEOUS | 0 refills | Status: DC

## 2021-04-03 MED ORDER — CYCLOBENZAPRINE HCL 10 MG PO TABS: 10.0000 mg | ORAL_TABLET | Freq: Two times a day (BID) | ORAL | 0 refills | Status: DC | PRN

## 2021-04-03 MED ORDER — HYDROXYZINE HCL 25 MG PO TABS: 25.0000 mg | ORAL_TABLET | Freq: Four times a day (QID) | ORAL | 0 refills | Status: DC
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
## 2021-04-03 NOTE — Discharge Instructions (Addendum)
Use the triamcinolone cream twice a day as needed for rash and itching. Take a daily allergy medication such as Claritin or Zyrtec. You can take hydroxyzine as needed for severe itching.  Do not take this medication before driving as it can make you sleepy.  You can take the Flexeril as needed for muscle pain and spasms.  We will contact you if the results from your lab work are positive and require additional treatment.     Do not have sex while taking undergoing treatment for STI.  Make sure that all of your partners get tested and treated.   Use a condom or other barrier method for all sexual encounters.    Return or go to the Emergency Department if symptoms worsen or do not improve in the next few days.

## 2021-04-04 ENCOUNTER — Telehealth: Payer: Self-pay | Admitting: Internal Medicine

## 2021-04-04 LAB — CERVICOVAGINAL ANCILLARY ONLY
Bacterial Vaginitis (gardnerella): NEGATIVE
Candida Glabrata: NEGATIVE
Candida Vaginitis: NEGATIVE
Chlamydia: NEGATIVE
Comment: NEGATIVE
Comment: NEGATIVE
Comment: NEGATIVE
Comment: NEGATIVE
Comment: NEGATIVE
Comment: NORMAL
Neisseria Gonorrhea: NEGATIVE
Trichomonas: NEGATIVE

## 2021-04-04 LAB — RPR: RPR Ser Ql: NONREACTIVE

## 2021-04-04 NOTE — Telephone Encounter (Signed)
Pt requesting a call back about her Test Results from the Urgent care on yesterday.

## 2021-04-04 NOTE — Progress Notes (Signed)
Internal Medicine Clinic Attending  I saw and evaluated the patient.  I personally confirmed the key portions of the history and exam documented by Dr. Masters and I reviewed pertinent patient test results.  The assessment, diagnosis, and plan were formulated together and I agree with the documentation in the resident's note.  

## 2021-04-05 NOTE — Telephone Encounter (Signed)
Requesting to speak with a nurse about getting tested for mold and mild dew. Please call pt back.

## 2021-04-07 DIAGNOSIS — Z1152 Encounter for screening for COVID-19: Secondary | ICD-10-CM | POA: Diagnosis not present

## 2021-04-07 NOTE — Telephone Encounter (Signed)
Spoke with patient in regards to testing.  She states that she was calling in regards to blood work to test for mold. Patient states that [employer] is asking that she have blood tests to help with investigations into her housing.  She is unsure what type of blood work they need. Asked patient to bring in paperwork for blood work she is requesting.

## 2021-04-07 NOTE — Telephone Encounter (Signed)
Pt states no one has called her back since 8/22, please call pt back. Pt is upset and unhappy.

## 2021-04-13 ENCOUNTER — Other Ambulatory Visit (HOSPITAL_COMMUNITY)
Admission: RE | Admit: 2021-04-13 | Discharge: 2021-04-13 | Disposition: A | Discharge status: Home / Self Care | Payer: Medicare HMO | Source: Ambulatory Visit | Attending: Obstetrics | Admitting: Obstetrics

## 2021-04-13 ENCOUNTER — Other Ambulatory Visit: Payer: Self-pay

## 2021-04-13 ENCOUNTER — Encounter: Payer: Self-pay | Admitting: Obstetrics

## 2021-04-13 ENCOUNTER — Telehealth: Payer: Self-pay

## 2021-04-13 ENCOUNTER — Ambulatory Visit (INDEPENDENT_AMBULATORY_CARE_PROVIDER_SITE_OTHER): Payer: Medicare HMO | Admitting: Obstetrics

## 2021-04-13 VITALS — BP 139/97 | HR 82 | Ht 63.0 in | Wt 192.4 lb

## 2021-04-13 DIAGNOSIS — Z1152 Encounter for screening for COVID-19: Secondary | ICD-10-CM | POA: Diagnosis not present

## 2021-04-13 DIAGNOSIS — N950 Postmenopausal bleeding: Secondary | ICD-10-CM | POA: Insufficient documentation

## 2021-04-13 DIAGNOSIS — R9389 Abnormal findings on diagnostic imaging of other specified body structures: Secondary | ICD-10-CM | POA: Insufficient documentation

## 2021-04-13 DIAGNOSIS — J4520 Mild intermittent asthma, uncomplicated: Secondary | ICD-10-CM

## 2021-04-13 DIAGNOSIS — N719 Inflammatory disease of uterus, unspecified: Secondary | ICD-10-CM | POA: Diagnosis not present

## 2021-04-13 DIAGNOSIS — N858 Other specified noninflammatory disorders of uterus: Secondary | ICD-10-CM | POA: Diagnosis not present

## 2021-04-13 NOTE — Telephone Encounter (Signed)
Needs to speak with a nurse about getting test for mold. Please call pt back.

## 2021-04-13 NOTE — Progress Notes (Signed)
Pt presents for endometrial bx  She is experiencing PMB  U/s reports thickness is 12 mm on 01/11/21

## 2021-04-13 NOTE — Telephone Encounter (Signed)
Called pt for more information; no answer, left message to call the office.

## 2021-04-13 NOTE — Telephone Encounter (Signed)
Call from pt - stated the health department does not do the test for mold. Stated the doctor she saw told her to call back with the test she needed for mold. Stated it's Radioallergosorbent.

## 2021-04-13 NOTE — Progress Notes (Signed)
Endometrial Biopsy Procedure Note  Pre-operative Diagnosis: Postmenopausal Bleeding.  Endometrial Thickening on Ultrasound  Post-operative Diagnosis: same  Indications: postmenopausal bleeding  Procedure Details   Urine pregnancy test was done in office and result was negative.  The risks (including infection, bleeding, pain, and uterine perforation) and benefits of the procedure were explained to the patient and Written informed consent was obtained.    The patient was placed in the dorsal lithotomy position.  Bimanual exam showed the uterus to be in the neutral position.  A Graves' speculum inserted in the vagina, and the cervix prepped with povidone iodine.  Endocervical curettage with a Kevorkian curette was not performed.   A sharp tenaculum was applied to the anterior lip of the cervix for stabilization.  A sterile uterine sound was used to sound the uterus to a depth of 5cm.  A Pipelle endometrial aspirator was used to sample the endometrium.  Sample was sent for pathologic examination.  Condition: Stable  Complications: None  Plan:  The patient was advised to call for any fever or for prolonged or severe pain or bleeding. She was advised to use NSAID as needed for mild to moderate pain. She was advised to avoid vaginal intercourse for 48 hours or until the bleeding has completely stopped.  Attending Physician Documentation: I was present for or participated in the entire procedure, including opening and closing.  Brock Bad, MD 04/13/2021 9:45 AM

## 2021-04-15 LAB — SURGICAL PATHOLOGY

## 2021-04-20 DIAGNOSIS — Z1152 Encounter for screening for COVID-19: Secondary | ICD-10-CM | POA: Diagnosis not present

## 2021-04-21 ENCOUNTER — Other Ambulatory Visit: Payer: Self-pay | Admitting: Obstetrics

## 2021-04-21 DIAGNOSIS — N950 Postmenopausal bleeding: Secondary | ICD-10-CM

## 2021-04-21 DIAGNOSIS — R102 Pelvic and perineal pain: Secondary | ICD-10-CM

## 2021-04-21 DIAGNOSIS — R9389 Abnormal findings on diagnostic imaging of other specified body structures: Secondary | ICD-10-CM

## 2021-04-25 ENCOUNTER — Other Ambulatory Visit: Payer: Self-pay | Admitting: Internal Medicine
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
## 2021-04-26 NOTE — Telephone Encounter (Signed)
Pt called back again today.

## 2021-04-26 NOTE — Telephone Encounter (Signed)
Called pt - no answer; left message to call the office to schedule an appt to discuss her situation.

## 2021-04-27 ENCOUNTER — Ambulatory Visit: Payer: Medicare HMO | Admitting: Licensed Clinical Social Worker

## 2021-04-27 DIAGNOSIS — Z1152 Encounter for screening for COVID-19: Secondary | ICD-10-CM | POA: Diagnosis not present
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
## 2021-04-27 NOTE — Patient Instructions (Signed)
Visit Information ° °Instructions: patient will work with SW to address concerns related to housing barriers. ° °Patient was given the following information about care management and care coordination services today, agreed to services, and gave verbal consent: 1.care management/care coordination services include personalized support from designated clinical staff supervised by their physician, including individualized plan of care and coordination with other care providers 2. 24/7 contact phone numbers for assistance for urgent and routine care needs. 3. The patient may stop care management/care coordination services at any time by phone call to the office staff. ° °Patient verbalizes understanding of instructions provided today and agrees to view in MyChart.  ° °The care management team will reach out to the patient again over the next 30 days.  ° °Sally-Ann Cutbirth, BSW  °Social Worker °[facility] Care Management  °[phone] °  ° °  °

## 2021-05-02 ENCOUNTER — Ambulatory Visit: Payer: Medicare HMO | Admitting: Obstetrics

## 2021-05-03 ENCOUNTER — Ambulatory Visit: Payer: Medicare HMO

## 2021-05-03 NOTE — Telephone Encounter (Signed)
Called patient today and discussed allergen testing.  She stated that the housing coalition had come to the house and taken pictures of the mold, and that she would like to proceed with testing for herself at her visit on Friday.  She also said she appreciated the call from Ms. Christen Butter, [facility] social worker.  She also looks forward to talking with a therapist to help her deal with stress.

## 2021-05-03 NOTE — Telephone Encounter (Signed)
Attempted to contact patient 09/15 and 09/19. Left voicemail 09/19.  Will attempt to contact again today.  Order placed for Allergen profile, mold, however I am unsure if showing that the patient is allergic to mold will help prove that her house has mold.  Patient has made appointment on 09/23 with [facility].

## 2021-05-04 ENCOUNTER — Ambulatory Visit: Payer: Medicare HMO

## 2021-05-06 ENCOUNTER — Encounter: Payer: Medicare HMO | Admitting: Internal Medicine

## 2021-05-11 DIAGNOSIS — Z1152 Encounter for screening for COVID-19: Secondary | ICD-10-CM | POA: Diagnosis not present

## 2021-05-12 DIAGNOSIS — Z1152 Encounter for screening for COVID-19: Secondary | ICD-10-CM | POA: Diagnosis not present

## 2021-05-19 ENCOUNTER — Encounter: Payer: Self-pay | Admitting: Internal Medicine

## 2021-05-19 ENCOUNTER — Encounter: Payer: Self-pay | Admitting: *Deleted

## 2021-05-19 NOTE — Progress Notes (Unsigned)

## 2021-05-20 ENCOUNTER — Encounter: Payer: Medicare HMO | Admitting: Internal Medicine

## 2021-05-23 ENCOUNTER — Telehealth: Payer: Self-pay

## 2021-05-23 DIAGNOSIS — I10 Essential (primary) hypertension: Secondary | ICD-10-CM

## 2021-05-23 DIAGNOSIS — J4520 Mild intermittent asthma, uncomplicated: Secondary | ICD-10-CM

## 2021-05-23 NOTE — Telephone Encounter (Signed)
Return pt's call. Stated she changed from [facility] insurance to [facility]. Stated [employer] told they will need a new referral in order for her  to keep her [facility] aide. Stated her aide is thru [facility] [employer] agency.  I do not see a previous referral nor PCS notes. I asked Lela S to f/u - stated she will call the pt. Also stated she needs refills on all of her medications - sent to [facility] pharmacy. Informed Albuterol was refilled 10/2020 x 12 RF's.

## 2021-05-23 NOTE — Telephone Encounter (Signed)
Pt is requesting a call back she needing to speak to the RN and her S.S worker to call back in regards  to her HHA

## 2021-05-23 NOTE — Telephone Encounter (Signed)
Spoke with patient regarding PCS. Patient already has services with [facility] [employer] [facility] (per patient). Patient states she is not due for renewal until January or February 2023. Will do paper work at that time.  Patient also has had insurance changes.

## 2021-05-24 ENCOUNTER — Ambulatory Visit: Payer: Medicare HMO | Admitting: Licensed Clinical Social Worker

## 2021-05-24 NOTE — Patient Instructions (Signed)
Visit Information  Instructions: patient will work with SW to address concerns related to Housing   Patient was given the following information about care management and care coordination services today, agreed to services, and gave verbal consent: 1.care management/care coordination services include personalized support from designated clinical staff supervised by their physician, including individualized plan of care and coordination with other care providers 2. 24/7 contact phone numbers for assistance for urgent and routine care needs. 3. The patient may stop care management/care coordination services at any time by phone call to the office staff.  Patient verbalizes understanding of instructions provided today and agrees to view in MyChart.   The care management team will reach out to the patient again over the next 30 days.   Marrianne Sica, BSW  Social Worker [facility] Care Management  [phone]      

## 2021-05-24 NOTE — Chronic Care Management (AMB) (Signed)
  Care Management   Social Work Visit Note  05/24/2021 Name: Hannah Young MRN: [id number] DOB: 05-Sep-1965  Hannah Young is a 55 y.o. year old female who sees Aslam, Leanna Sato, MD for primary care. The care management team was consulted for assistance with care management and care coordination needs related to [facility] Resources    Patient was given the following information about care management and care coordination services today, agreed to services, and gave verbal consent: 1.care management/care coordination services include personalized support from designated clinical staff supervised by their physician, including individualized plan of care and coordination with other care providers 2. 24/7 contact phone numbers for assistance for urgent and routine care needs. 3. The patient may stop care management/care coordination services at any time by phone call to the office staff.  Engaged with patient by telephone for follow up visit in response to provider referral for social work chronic care management and care coordination services.  Assessment: Review of patient history, allergies, and health status during evaluation of patient need for care management/care coordination services.    Interventions:  Patient interviewed and appropriate assessments performed Collaborated with clinical team regarding patient needs  Patient is seeking a new residence. Patient is not at risk for eviction, but states she has mildew in home. Patient advised landlord placed new floors, but didn't address the mildew. Patient is seeking assistance through [employer] to pay back rent and late fees. Patient is having difficulty getting in touch with [employer]. SW will attempt Holiday representative to advocate for patient.  SW will attempt other programs to see if $2600 can be raised.  Patient stated no additional needs or concerns.   SDOH (Social Determinants of Health) assessments performed: No     Plan:   patient will work with BSW to address needs related to Level of care concerns Social Worker will follow up in 30 days.   Christen Butter, BSW  Social Worker [facility] Care Management  [phone]

## 2021-05-28 ENCOUNTER — Emergency Department (HOSPITAL_COMMUNITY)
Admission: EM | Admit: 2021-05-28 | Discharge: 2021-05-28 | Disposition: A | Discharge status: Home / Self Care | Payer: 59 | Attending: Emergency Medicine | Admitting: Emergency Medicine

## 2021-05-28 ENCOUNTER — Emergency Department (HOSPITAL_COMMUNITY): Payer: 59

## 2021-05-28 ENCOUNTER — Other Ambulatory Visit: Payer: Self-pay

## 2021-05-28 ENCOUNTER — Encounter (HOSPITAL_COMMUNITY): Payer: Self-pay | Admitting: Emergency Medicine

## 2021-05-28 ENCOUNTER — Ambulatory Visit (HOSPITAL_COMMUNITY): Payer: 59

## 2021-05-28 DIAGNOSIS — Z794 Long term (current) use of insulin: Secondary | ICD-10-CM | POA: Diagnosis not present

## 2021-05-28 DIAGNOSIS — Z87891 Personal history of nicotine dependence: Secondary | ICD-10-CM | POA: Insufficient documentation

## 2021-05-28 DIAGNOSIS — E119 Type 2 diabetes mellitus without complications: Secondary | ICD-10-CM | POA: Insufficient documentation

## 2021-05-28 DIAGNOSIS — Z20822 Contact with and (suspected) exposure to covid-19: Secondary | ICD-10-CM | POA: Diagnosis not present

## 2021-05-28 DIAGNOSIS — Z79899 Other long term (current) drug therapy: Secondary | ICD-10-CM | POA: Diagnosis not present

## 2021-05-28 DIAGNOSIS — R000 Tachycardia, unspecified: Secondary | ICD-10-CM | POA: Insufficient documentation

## 2021-05-28 DIAGNOSIS — I10 Essential (primary) hypertension: Secondary | ICD-10-CM | POA: Diagnosis not present

## 2021-05-28 DIAGNOSIS — A64 Unspecified sexually transmitted disease: Secondary | ICD-10-CM | POA: Diagnosis not present

## 2021-05-28 DIAGNOSIS — M7918 Myalgia, other site: Secondary | ICD-10-CM | POA: Insufficient documentation

## 2021-05-28 DIAGNOSIS — Z91040 Latex allergy status: Secondary | ICD-10-CM | POA: Insufficient documentation

## 2021-05-28 DIAGNOSIS — R0602 Shortness of breath: Secondary | ICD-10-CM | POA: Diagnosis not present

## 2021-05-28 DIAGNOSIS — J45909 Unspecified asthma, uncomplicated: Secondary | ICD-10-CM | POA: Diagnosis not present

## 2021-05-28 DIAGNOSIS — R0789 Other chest pain: Secondary | ICD-10-CM | POA: Diagnosis present

## 2021-05-28 DIAGNOSIS — R52 Pain, unspecified: Secondary | ICD-10-CM

## 2021-05-28 DIAGNOSIS — R112 Nausea with vomiting, unspecified: Secondary | ICD-10-CM

## 2021-05-28 DIAGNOSIS — B349 Viral infection, unspecified: Secondary | ICD-10-CM | POA: Insufficient documentation

## 2021-05-28 LAB — COMPREHENSIVE METABOLIC PANEL
ALT: 225 U/L — ABNORMAL HIGH (ref 0–44)
AST: 190 U/L — ABNORMAL HIGH (ref 15–41)
Albumin: 3 g/dL — ABNORMAL LOW (ref 3.5–5.0)
Alkaline Phosphatase: 130 U/L — ABNORMAL HIGH (ref 38–126)
Anion gap: 8 (ref 5–15)
BUN: 11 mg/dL (ref 6–20)
CO2: 27 mmol/L (ref 22–32)
Calcium: 9.3 mg/dL (ref 8.9–10.3)
Chloride: 99 mmol/L (ref 98–111)
Creatinine, Ser: 0.67 mg/dL (ref 0.44–1.00)
GFR, Estimated: >60 mL/min (ref >60)
Glucose, Bld: 215 mg/dL — ABNORMAL HIGH (ref 70–99)
Potassium: 4.4 mmol/L (ref 3.5–5.1)
Sodium: 134 mmol/L — ABNORMAL LOW (ref 135–145)
Total Bilirubin: 0.8 mg/dL (ref 0.3–1.2)
Total Protein: 8.1 g/dL (ref 6.5–8.1)

## 2021-05-28 LAB — CBC WITH DIFFERENTIAL/PLATELET
Abs Immature Granulocytes: 0.03 10*3/uL (ref 0.00–0.07)
Basophils Absolute: 0.1 10*3/uL (ref 0.0–0.1)
Basophils Relative: 1 %
Eosinophils Absolute: 0.2 10*3/uL (ref 0.0–0.5)
Eosinophils Relative: 3 %
HCT: 39.9 % (ref 36.0–46.0)
Hemoglobin: 13.2 g/dL (ref 12.0–15.0)
Immature Granulocytes: 0 %
Lymphocytes Relative: 25 %
Lymphs Abs: 1.9 10*3/uL (ref 0.7–4.0)
MCH: 32.8 pg (ref 26.0–34.0)
MCHC: 33.1 g/dL (ref 30.0–36.0)
MCV: 99.3 fL (ref 80.0–100.0)
Monocytes Absolute: 0.9 10*3/uL (ref 0.1–1.0)
Monocytes Relative: 11 %
Neutro Abs: 4.7 10*3/uL (ref 1.7–7.7)
Neutrophils Relative %: 60 %
Platelets: 361 10*3/uL (ref 150–400)
RBC: 4.02 MIL/uL (ref 3.87–5.11)
RDW: 13.6 % (ref 11.5–15.5)
WBC: 7.7 10*3/uL (ref 4.0–10.5)
nRBC: 0 % (ref 0.0–0.2)

## 2021-05-28 LAB — WET PREP, GENITAL
Clue Cells Wet Prep HPF POC: NONE SEEN
Sperm: NONE SEEN
Trich, Wet Prep: NONE SEEN
Yeast Wet Prep HPF POC: NONE SEEN

## 2021-05-28 LAB — RESP PANEL BY RT-PCR (FLU A&B, COVID) ARPGX2
Influenza A by PCR: NEGATIVE
Influenza B by PCR: NEGATIVE
SARS Coronavirus 2 by RT PCR: NEGATIVE

## 2021-05-28 LAB — HIV ANTIBODY (ROUTINE TESTING W REFLEX): HIV Screen 4th Generation wRfx: NONREACTIVE

## 2021-05-28 LAB — BRAIN NATRIURETIC PEPTIDE: B Natriuretic Peptide: 21.2 pg/mL (ref 0.0–100.0)

## 2021-05-28 MED ORDER — IPRATROPIUM BROMIDE 0.02 % IN SOLN
0.5000 mg | Freq: Once | RESPIRATORY_TRACT | Status: AC
  Administered 2021-05-28: 0.5 mg via RESPIRATORY_TRACT
  Filled 2021-05-28: qty 2.5

## 2021-05-28 MED ORDER — ONDANSETRON 4 MG PO TBDP: 4.0000 mg | ORAL_TABLET | Freq: Three times a day (TID) | ORAL | 0 refills | Status: DC | PRN

## 2021-05-28 MED ORDER — ONDANSETRON HCL 4 MG/2ML IJ SOLN
4.0000 mg | Freq: Once | INTRAMUSCULAR | Status: AC
  Administered 2021-05-28: 4 mg via INTRAVENOUS
  Filled 2021-05-28: qty 2

## 2021-05-28 MED ORDER — ALBUTEROL SULFATE (2.5 MG/3ML) 0.083% IN NEBU
2.5000 mg | INHALATION_SOLUTION | Freq: Once | RESPIRATORY_TRACT | Status: AC
  Administered 2021-05-28: 2.5 mg via RESPIRATORY_TRACT
  Filled 2021-05-28: qty 3

## 2021-05-28 MED ORDER — KETOROLAC TROMETHAMINE 15 MG/ML IJ SOLN
15.0000 mg | Freq: Once | INTRAMUSCULAR | Status: AC
  Administered 2021-05-28: 15 mg via INTRAVENOUS
  Filled 2021-05-28: qty 1

## 2021-05-28 NOTE — ED Provider Notes (Signed)
Emergency Medicine Provider Triage Evaluation Note  Hannah Young , a 55 y.o. female  was evaluated in triage.  Pt complains of "everything".  Patient primarily states she is feeling short of breath for about a week, she is not coughing but feels especially short of breath when she lays down flat at night.  She has had fevers and chills, took a negative at home COVID test.  Also having diarrhea and vomiting. patient also reports she wants STD testing.    Review of Systems  Positive: Shortness of breath, diarrhea, vomiting Negative:   Physical Exam  BP (!) 123/107 (BP Location: Right Arm)   Pulse (!) 104   Temp 98.2 F (36.8 C) (Oral)   Resp 16   SpO2 98%  Gen:   Awake, no distress   Resp:  Normal effort  MSK:   Moves extremities without difficulty  Other:  Abdomen soft, lung sounds clear to auscultation bilaterally  Medical Decision Making  Medically screening exam initiated at 9:25 AM.  Appropriate orders placed.  Hannah Young was informed that the remainder of the evaluation will be completed by another provider, this initial triage assessment does not replace that evaluation, and the importance of remaining in the ED until their evaluation is complete.  Shortness of breath work-up.  Patient informed STD testing does not need to be done on an emergent basis.   Theron Arista, PA-C 05/28/21 6226    Benjiman Core, MD 05/28/21 1055
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
## 2021-05-28 NOTE — Discharge Instructions (Addendum)
You likely have a viral illness that caused your symptoms. Take Tylenol or ibuprofen as needed for pain. Make sure you are staying hydrated with water. You Zofran as needed for nausea or vomiting. Continue use your albuterol inhaler, every 4 hours while awake, for the next 2 days.  After this, use as needed for shortness of breath, chest tightness, wheezing. Follow-up with your primary care doctor for recheck of your symptoms. Return to the emergency room with any new, worsening, concerning symptoms

## 2021-05-28 NOTE — ED Triage Notes (Signed)
Pt reports SOB x 1 week that is worse when lying down.  Took negative home COVID test.  Also reports vomiting and diarrhea.  States she has a bump in her vaginal area that she squeezed and wants to be STD tested.

## 2021-05-29 LAB — RPR: RPR Ser Ql: NONREACTIVE

## 2021-05-30 ENCOUNTER — Telehealth: Payer: Self-pay

## 2021-05-30 ENCOUNTER — Telehealth (HOSPITAL_COMMUNITY): Payer: Self-pay

## 2021-05-30 LAB — GC/CHLAMYDIA PROBE AMP (~~LOC~~) NOT AT ARMC
Chlamydia: NEGATIVE
Comment: NEGATIVE
Comment: NORMAL
Neisseria Gonorrhea: NEGATIVE

## 2021-05-30 NOTE — Telephone Encounter (Signed)
Seen in [facility] ED on 10/15.

## 2021-05-30 NOTE — Telephone Encounter (Signed)
Requesting test results, please call back.  

## 2021-06-01 ENCOUNTER — Encounter: Payer: 59 | Admitting: Internal Medicine

## 2021-06-09 ENCOUNTER — Encounter: Payer: 59 | Admitting: Internal Medicine

## 2021-06-14 ENCOUNTER — Telehealth: Payer: 59 | Admitting: Licensed Clinical Social Worker

## 2021-06-15 ENCOUNTER — Telehealth: Payer: Self-pay | Admitting: *Deleted

## 2021-06-15 ENCOUNTER — Telehealth: Payer: Self-pay | Admitting: Licensed Clinical Social Worker

## 2021-06-15 ENCOUNTER — Telehealth: Payer: 59 | Admitting: Licensed Clinical Social Worker
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
## 2021-06-15 NOTE — Telephone Encounter (Signed)
I would really encourage her to come in person. There is only so much that can be done via  telehealth visit and I think if she is in that much pain she will need a physical exam and possibly some blood work.

## 2021-06-15 NOTE — Telephone Encounter (Signed)
  Care Management   Follow Up Note   06/15/2021 Name: Hannah Young MRN: [id number] DOB: [birth year]   Referred by: Eliezer Bottom, MD Reason for referral : Care Coordination (BSW Follow Up)   SW returned phone call twice on 06/14/2021. Sw attempted patient again twice on 06/15/2021. Sw left VM.   Christen Butter, BSW  Social Worker [facility] Care Management  [phone]

## 2021-06-16 ENCOUNTER — Telehealth: Payer: 59 | Admitting: Licensed Clinical Social Worker

## 2021-06-16 ENCOUNTER — Telehealth: Payer: 59 | Admitting: Student

## 2021-06-16 ENCOUNTER — Ambulatory Visit: Payer: Self-pay | Admitting: Licensed Clinical Social Worker

## 2021-06-16 ENCOUNTER — Telehealth: Payer: Self-pay | Admitting: Licensed Clinical Social Worker

## 2021-06-16 NOTE — Telephone Encounter (Signed)
Called pt about changing telehealth to in person appt per Dr Evie Lacks. Pt stated she rides transportation and need to call 3 days in advance. Blue team full - appt schedule w/Dr Marijo Conception on Tues 11/8 @ 0915 AM.

## 2021-06-16 NOTE — Telephone Encounter (Signed)
  Care Management   Follow Up Note   06/16/2021 Name: Hannah Young MRN: [id number] DOB: January 21, 1966   Referred by: Eliezer Bottom, MD Reason for referral : No chief complaint on file.   An unsuccessful telephone outreach was attempted today. The patient was referred to the case management team for assistance with care management and care coordination.   Patient phone is ringing once and SW unable to leave a VM.   Follow Up Plan: The care management team will reach out to the patient again over the next 7 days.   Christen Butter, BSW  Social Worker [facility] Management  [phone]

## 2021-06-21 ENCOUNTER — Encounter: Payer: 59 | Admitting: Student

## 2021-07-04 ENCOUNTER — Encounter: Payer: 59 | Admitting: Internal Medicine

## 2021-07-05 ENCOUNTER — Encounter: Payer: 59 | Admitting: Internal Medicine

## 2021-07-14 ENCOUNTER — Telehealth: Payer: Self-pay

## 2021-07-14 NOTE — Telephone Encounter (Signed)
PA  for Pt ( NOVOLOG 100 UNIT/ML )  came through on cover my meds was done and sent back with office notes from 3/9... Awaiting approval or denial

## 2021-07-21 ENCOUNTER — Ambulatory Visit: Payer: Self-pay | Admitting: Licensed Clinical Social Worker

## 2021-07-21 NOTE — Patient Instructions (Signed)
Visit Information  Instructions: patient will work with SW to address concerns related to housing barriers, mental health and pain.   Patient was given the following information about care management and care coordination services today, agreed to services, and gave verbal consent: 1.care management/care coordination services include personalized support from designated clinical staff supervised by their physician, including individualized plan of care and coordination with other care providers 2. 24/7 contact phone numbers for assistance for urgent and routine care needs. 3. The patient may stop care management/care coordination services at any time by phone call to the office staff.  Patient verbalizes understanding of instructions provided today and agrees to view in MyChart.   The care management team will reach out to the patient again over the next 7 days.  Christen Butter, BSW  Social Worker [facility]  [phone]
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
## 2021-07-21 NOTE — Patient Instructions (Signed)
Visit Information  Instructions: patient will work with SW to address needs related to housing   Patient was given the following information about care management and care coordination services today, agreed to services, and gave verbal consent: 1.care management/care coordination services include personalized support from designated clinical staff supervised by their physician, including individualized plan of care and coordination with other care providers 2. 24/7 contact phone numbers for assistance for urgent and routine care needs. 3. The patient may stop care management/care coordination services at any time by phone call to the office staff.  Patient verbalizes understanding of instructions provided today and agrees to view in MyChart.   The care management team will reach out to the patient again over the next 30 days.   Christen Butter, BSW  Social Worker [facility] Care Management  [phone]

## 2021-07-21 NOTE — Chronic Care Management (AMB) (Signed)
  Care Management   Social Work Visit Note  07/21/2021 Name: Hannah Young MRN: [id number] DOB: [birth year]  Hannah Young is a 55 y.o. year old female who sees Aslam, Leanna Sato, MD for primary care. The care management team was consulted for assistance with care management and care coordination needs related to [facility] Resources    Patient was given the following information about care management and care coordination services today, agreed to services, and gave verbal consent: 1.care management/care coordination services include personalized support from designated clinical staff supervised by their physician, including individualized plan of care and coordination with other care providers 2. 24/7 contact phone numbers for assistance for urgent and routine care needs. 3. The patient may stop care management/care coordination services at any time by phone call to the office staff.  Engaged with patient by telephone for follow up visit in response to provider referral for social work chronic care management and care coordination services.  Assessment: Review of patient history, allergies, and health status during evaluation of patient need for care management/care coordination services.    Interventions:  Patient interviewed and appropriate assessments performed Collaborated with clinical team regarding patient needs  Patient and Grandkids at risk of eviction. Sw contacted [facility] Emergency assistance with a follow up on financial assistance request.  Patient stated she was in a lot of pain. SW sent inbasket for [facility] to outreach. Appointment is scheduled for 12/09. Patient requested therapy. SW sent request to Dr. Monna Fam. Patient is having thoughts of self harm.      Plan:  patient will work with BSW to address needs related to Housing barriers Child psychotherapist will follow up with [employer] assistance and patient within the next 7-days.Christen Butter, BSW  Social Worker [facility] Care  Management  [phone]

## 2021-07-22 ENCOUNTER — Ambulatory Visit: Payer: 59

## 2021-07-22 NOTE — Patient Instructions (Signed)
Visit Information  Thank you for taking time to visit with me today. Please don't hesitate to contact me if I can be of assistance to you before our next scheduled telephone appointment.  Our next appointment is by telephone on 07/27/21 at 0830.  Please call the care guide team at [phone] if you need to cancel or reschedule your appointment.   If you are experiencing a Mental Health or Behavioral Health Crisis or need someone to talk to, please call the [place] National Suicide Prevention Lifeline: 223-435-5160 or TTY: (778)429-5155 TTY (551)722-3904) to talk to a trained counselor   The patient verbalized understanding of instructions, educational materials, and care plan provided today and declined offer to receive copy of patient instructions, educational materials, and care plan.   The patient has been provided with contact information for the care management team and has been advised to call with any health related questions or concerns.   Jodelle Gross, RN, BSN, CCM Care Management Coordinator [facility] Phone: [phone]/Fax: [phone]
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
## 2021-07-25 ENCOUNTER — Encounter: Payer: 59 | Admitting: Internal Medicine

## 2021-07-25 ENCOUNTER — Other Ambulatory Visit: Payer: Self-pay | Admitting: Internal Medicine

## 2021-07-25 DIAGNOSIS — Z794 Long term (current) use of insulin: Secondary | ICD-10-CM

## 2021-07-25 DIAGNOSIS — E119 Type 2 diabetes mellitus without complications: Secondary | ICD-10-CM

## 2021-07-26 ENCOUNTER — Telehealth: Payer: 59 | Admitting: Licensed Clinical Social Worker

## 2021-07-27 ENCOUNTER — Telehealth: Payer: 59

## 2021-08-18 ENCOUNTER — Encounter: Payer: 59 | Admitting: Internal Medicine

## 2021-08-21 ENCOUNTER — Telehealth: Payer: Self-pay | Admitting: Student

## 2021-08-21 NOTE — Telephone Encounter (Signed)
Received on-call page to contact [name]. Attempted to call, no answer, unable to leave VM.

## 2021-08-23 ENCOUNTER — Encounter: Payer: Medicaid Other | Admitting: Internal Medicine

## 2021-08-23 ENCOUNTER — Telehealth: Payer: 59

## 2021-08-23 ENCOUNTER — Telehealth: Payer: Self-pay

## 2021-08-23 NOTE — Telephone Encounter (Signed)
°  Care Management   Outreach Note  08/23/2021 Name: KIMALA HORNE MRN: [id number] DOB: 05/28/1966  Referred by: Eliezer Bottom, MD Reason for referral : No chief complaint on file.   An unsuccessful telephone outreach was attempted today. The patient was referred to the case management team for assistance with care management and care coordination.   Follow Up Plan: If patient returns call to provider office, please advise to call Embedded Care Management Care Guide Stacey at [phone].  Jodelle Gross, RN, BSN, CCM Care Management Coordinator [facility] Phone: [phone]/Fax: [phone]

## 2021-09-02 ENCOUNTER — Ambulatory Visit (INDEPENDENT_AMBULATORY_CARE_PROVIDER_SITE_OTHER): Payer: Commercial Managed Care - HMO | Admitting: Orthopaedic Surgery

## 2021-09-02 ENCOUNTER — Other Ambulatory Visit: Payer: Self-pay

## 2021-09-02 ENCOUNTER — Encounter: Payer: Self-pay | Admitting: Orthopaedic Surgery

## 2021-09-02 DIAGNOSIS — M1712 Unilateral primary osteoarthritis, left knee: Secondary | ICD-10-CM

## 2021-09-02 DIAGNOSIS — M1711 Unilateral primary osteoarthritis, right knee: Secondary | ICD-10-CM | POA: Diagnosis not present

## 2021-09-02 MED ORDER — LIDOCAINE HCL 1 % IJ SOLN
2.0000 mL | INTRAMUSCULAR | Status: AC | PRN
Start: 2021-09-02 — End: 2021-09-02
  Administered 2021-09-02: 2 mL

## 2021-09-02 MED ORDER — METHYLPREDNISOLONE ACETATE 40 MG/ML IJ SUSP
40.0000 mg | INTRAMUSCULAR | Status: AC | PRN
  Administered 2021-09-02: 40 mg via INTRA_ARTICULAR

## 2021-09-02 MED ORDER — BUPIVACAINE HCL 0.5 % IJ SOLN
2.0000 mL | INTRAMUSCULAR | Status: AC | PRN
  Administered 2021-09-02: 2 mL via INTRA_ARTICULAR
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
## 2021-09-06 ENCOUNTER — Encounter: Payer: Self-pay | Admitting: Internal Medicine

## 2021-09-06 ENCOUNTER — Other Ambulatory Visit: Payer: Self-pay

## 2021-09-06 ENCOUNTER — Ambulatory Visit (INDEPENDENT_AMBULATORY_CARE_PROVIDER_SITE_OTHER): Payer: Commercial Managed Care - HMO | Admitting: Internal Medicine

## 2021-09-06 VITALS — BP 137/88 | HR 78 | Temp 98.3°F | Ht 61.0 in | Wt 198.7 lb

## 2021-09-06 DIAGNOSIS — E119 Type 2 diabetes mellitus without complications: Secondary | ICD-10-CM | POA: Diagnosis not present

## 2021-09-06 DIAGNOSIS — J4520 Mild intermittent asthma, uncomplicated: Secondary | ICD-10-CM | POA: Diagnosis not present

## 2021-09-06 DIAGNOSIS — F32A Depression, unspecified: Secondary | ICD-10-CM | POA: Diagnosis not present

## 2021-09-06 DIAGNOSIS — I10 Essential (primary) hypertension: Secondary | ICD-10-CM | POA: Diagnosis not present

## 2021-09-06 DIAGNOSIS — Z794 Long term (current) use of insulin: Secondary | ICD-10-CM

## 2021-09-06 DIAGNOSIS — Z77120 Contact with and (suspected) exposure to mold (toxic): Secondary | ICD-10-CM

## 2021-09-06 MED ORDER — ALBUTEROL SULFATE HFA 108 (90 BASE) MCG/ACT IN AERS: INHALATION_SPRAY | RESPIRATORY_TRACT | 12 refills | Status: DC

## 2021-09-06 MED ORDER — LISINOPRIL 10 MG PO TABS: 10.0000 mg | ORAL_TABLET | Freq: Every day | ORAL | 1 refills | Status: DC

## 2021-09-06 MED ORDER — VENLAFAXINE HCL ER 37.5 MG PO CP24: 37.5000 mg | ORAL_CAPSULE | Freq: Every day | ORAL | 2 refills | Status: DC

## 2021-09-06 NOTE — Progress Notes (Signed)
° °  CC: DM, mold exposure, depression  HPI:  Hannah Young is a 56 y.o. with a PMHx listed below presenting for evaluation of her diabetes, mold exposure and depression. For details of today's visit and the status of his chronic medical issues please refer to the assessment and plan.   Past Medical History:  Diagnosis Date   Anxiety    Arthritis    Asthma    Autoimmune hepatitis (HCC)    Bipolar 1 disorder (HCC)    Breast lump    Depression    Diabetes mellitus    FH: chemotherapy    Gout    High cholesterol    Hypertension    Liver cirrhosis (HCC)    Lung nodules    Neuropathy    Panic disorder    Vaginal discharge 01/25/2017   Vertigo    Review of Systems:   Review of Systems  Constitutional:  Positive for malaise/fatigue. Negative for chills and fever.  Eyes:  Positive for blurred vision. Negative for double vision, photophobia and pain.  Respiratory:  Positive for cough and shortness of breath.   Cardiovascular:  Negative for chest pain and leg swelling.  Gastrointestinal:  Negative for abdominal pain, nausea and vomiting.  Musculoskeletal:  Positive for falls and joint pain.  Skin:  Positive for itching and rash.  Neurological:  Positive for dizziness, weakness and headaches.  Psychiatric/Behavioral:  Positive for depression. Negative for suicidal ideas.     Physical Exam:  Vitals:   09/06/21 0901  BP: 137/88  Pulse: 78  Temp: 98.3 F (36.8 C)  TempSrc: Oral  SpO2: 99%  Weight: 198 lb 11.2 oz (90.1 kg)  Height: 5\' 1"  (1.549 m)    Physical Exam General: alert, appears stated age, in no acute distress HEENT: Normocephalic, atraumatic, EOM intact, conjunctiva normal CV: Regular rate and rhythm, no murmurs rubs or gallops Pulm: Clear to auscultation bilaterally, normal work of breathing Abdomen: Soft, nondistended, bowel sounds present, no tenderness to palpation MSK: No lower extremity edema Skin: Warm and dry, some patches of skin darkening along  the right face and chin, also some erythematous 1 mm papular lesions along the chin Neuro: Alert and oriented x3   Assessment & Plan:   See Encounters Tab for problem based charting.  Patient discussed with Dr. 

## 2021-09-06 NOTE — Assessment & Plan Note (Signed)
Plan was to check a hemoglobin A1c today however patient left before this was drawn.  Recommend she return in 2 weeks for hemoglobin A1c check as well as bringing in her glucometer so we can adjust her insulin regimen if needed.

## 2021-09-06 NOTE — Assessment & Plan Note (Signed)
Patient is concerned that she has had a variety of symptoms arise since living in her current apartment for the past 2 years.  She endorses headaches, dizziness, falls, facial burning sensations, skin darkening, rashes, itching, shortness of breath and trouble breathing, throat soreness and joint pain.  She states that she is in the process of trying to move but is unable to afford other housing at this time.  She does not have friends or family that she can currently stay with.  She has complained to her landlord numerous times and the Associate Professor has evaluated but stated this is out of his jurisdiction.  She states that she did receive a voucher to help finding housing but can still not afford a different apartment unit at this time.  On exam, patient is breathing comfortably on room air and lung exam was unremarkable with clear to auscultation bilaterally.  Did not appreciate any oral exudate or erythema in the back of the throat.  No cervical lymphadenopathy.  Patient does have some skin darkening and what appears to be acne on her chin.  Assessment/plan: Discussed at length that mold exposure can cause allergenic symptoms.  Discussed that in patients that are immunocompromised can cause more severe lung disease however that is not the case with her.  Will refer to an allergist for further work-up.  -Referral to allergy -Hydroxyzine for itching

## 2021-09-06 NOTE — Assessment & Plan Note (Addendum)
PHQ-9 is 24 today. It was 23 in 03/2021.  Patient denies any suicidal ideations or thoughts.  Initially expressed anger towards her landlord but states she would not harm him or any others.  She states that she has to take care of her grandkids.  2 of her daughters are in prison unfortunately and she has to take care of herself and her grandkids.  She is open to starting medications at this time.  States in the past she took Effexor with some improvement.  Does not want to take Prozac.  Patient is not interested in therapy at this time.  -Start Effexor 37.5 mg daily -Follow-up in 4 weeks

## 2021-09-06 NOTE — Patient Instructions (Signed)
I am referring you to an allergist to discuss the symptoms of your mold exposure.   I also want you to start taking Effexor 37.5 mg daily for your depression.  Follow up in about 4- 6 weeks. Please bring your glucometer at this time.

## 2021-09-07 NOTE — Progress Notes (Signed)
Internal Medicine Clinic Attending ° °Case discussed with Dr. Rehman  At the time of the visit.  We reviewed the resident’s history and exam and pertinent patient test results.  I agree with the assessment, diagnosis, and plan of care documented in the resident’s note.  ° °

## 2021-09-08 ENCOUNTER — Telehealth: Payer: Self-pay

## 2021-09-08 NOTE — Telephone Encounter (Signed)
Pt is requesting a call back .. she is wanting a letter or paperwork with her health history and current health issues .Hannah Young

## 2021-09-08 NOTE — Telephone Encounter (Signed)
Return pt's call. Stated she needs a letter,to obtain new housing,stating how living with mold in her present house is affecting her health. Stated he sister is helping her to find new housing.Last OV was 09/06/21. Stated she need this letter as soon as possible. Thanks

## 2021-09-08 NOTE — Telephone Encounter (Signed)
Called pt to let her know she had already received a letter; pt agreed and stated she needs a letter from her liver doctor who she has already talked to.

## 2021-09-13 ENCOUNTER — Encounter: Payer: Self-pay | Admitting: *Deleted

## 2021-09-29 ENCOUNTER — Encounter: Payer: Commercial Managed Care - HMO | Admitting: Internal Medicine

## 2021-09-29 ENCOUNTER — Other Ambulatory Visit: Payer: Self-pay | Admitting: Internal Medicine

## 2021-09-29 ENCOUNTER — Ambulatory Visit: Payer: Commercial Managed Care - HMO | Admitting: Obstetrics

## 2021-09-29 DIAGNOSIS — E118 Type 2 diabetes mellitus with unspecified complications: Secondary | ICD-10-CM

## 2021-09-29 NOTE — Telephone Encounter (Signed)
Front office can you schedule pt an in-person appt for diabetes f/u per Dr Marva Panda. Hannah Young

## 2021-09-29 NOTE — Telephone Encounter (Signed)
Needs in person appointment for diabetes follow up.

## 2021-10-03 ENCOUNTER — Other Ambulatory Visit: Payer: Self-pay | Admitting: Internal Medicine

## 2021-10-03 DIAGNOSIS — E119 Type 2 diabetes mellitus without complications: Secondary | ICD-10-CM

## 2021-10-03 DIAGNOSIS — E118 Type 2 diabetes mellitus with unspecified complications: Secondary | ICD-10-CM

## 2021-10-04 NOTE — Telephone Encounter (Signed)
Will send refill on [facility]. Novolog refill not appropriate at this time (ordered 5 days ago). Patient needs appointment for diabetes follow up - she is due for A1c.

## 2021-10-04 NOTE — Telephone Encounter (Signed)
Called pt to schedule in person appt per Dr Mcarthur Rossetti - stated she's in the process of moving and she will call back.

## 2021-11-02 ENCOUNTER — Other Ambulatory Visit (HOSPITAL_COMMUNITY)
Admission: RE | Admit: 2021-11-02 | Discharge: 2021-11-02 | Disposition: A | Discharge status: Home / Self Care | Payer: Medicare Other | Source: Ambulatory Visit | Attending: Obstetrics | Admitting: Obstetrics

## 2021-11-02 ENCOUNTER — Encounter: Payer: Self-pay | Admitting: Obstetrics

## 2021-11-02 ENCOUNTER — Other Ambulatory Visit: Payer: Self-pay

## 2021-11-02 ENCOUNTER — Ambulatory Visit (INDEPENDENT_AMBULATORY_CARE_PROVIDER_SITE_OTHER): Payer: Medicare Other | Admitting: Obstetrics

## 2021-11-02 ENCOUNTER — Other Ambulatory Visit: Payer: Self-pay | Admitting: Internal Medicine

## 2021-11-02 VITALS — BP 138/91 | HR 89 | Ht 61.0 in | Wt 192.1 lb

## 2021-11-02 DIAGNOSIS — Z1239 Encounter for other screening for malignant neoplasm of breast: Secondary | ICD-10-CM

## 2021-11-02 DIAGNOSIS — N951 Menopausal and female climacteric states: Secondary | ICD-10-CM

## 2021-11-02 DIAGNOSIS — Z01419 Encounter for gynecological examination (general) (routine) without abnormal findings: Secondary | ICD-10-CM | POA: Diagnosis not present

## 2021-11-02 DIAGNOSIS — F320 Major depressive disorder, single episode, mild: Secondary | ICD-10-CM

## 2021-11-02 DIAGNOSIS — Z1211 Encounter for screening for malignant neoplasm of colon: Secondary | ICD-10-CM | POA: Diagnosis not present

## 2021-11-02 DIAGNOSIS — N898 Other specified noninflammatory disorders of vagina: Secondary | ICD-10-CM | POA: Diagnosis present

## 2021-11-02 DIAGNOSIS — Z1151 Encounter for screening for human papillomavirus (HPV): Secondary | ICD-10-CM | POA: Insufficient documentation

## 2021-11-02 DIAGNOSIS — R9389 Abnormal findings on diagnostic imaging of other specified body structures: Secondary | ICD-10-CM | POA: Diagnosis not present

## 2021-11-02 DIAGNOSIS — E2839 Other primary ovarian failure: Secondary | ICD-10-CM

## 2021-11-02 DIAGNOSIS — F32A Depression, unspecified: Secondary | ICD-10-CM

## 2021-11-02 DIAGNOSIS — Z113 Encounter for screening for infections with a predominantly sexual mode of transmission: Secondary | ICD-10-CM

## 2021-11-02 DIAGNOSIS — E669 Obesity, unspecified: Secondary | ICD-10-CM

## 2021-11-02 NOTE — Progress Notes (Signed)
? ?Subjective: ? ? ?  ?  ? Hannah Young is a 56 y.o. female here for a routine exam.  Current complaints: Vaginal discharge.  Also c/o hot flushes.  No period for 2 months.  Had perimenopausal vaginal bleeding last year, and ultrasound showed a thickened endometrium.  She did not follow up. ? ?Personal health questionnaire:  ?Is patient Ashkenazi Jewish, have a family history of breast and/or ovarian cancer: no ?Is there a family history of uterine cancer diagnosed at age < 62, gastrointestinal cancer, urinary tract cancer, family member who is a Field seismologist syndrome-associated carrier: no ?Is the patient overweight and hypertensive, family history of diabetes, personal history of gestational diabetes, preeclampsia or PCOS: yes ?Is patient over 19, have PCOS,  family history of premature CHD under age 75, diabetes, smoke, have hypertension or peripheral artery disease:  no ?At any time, has a partner hit, kicked or otherwise hurt or frightened you?: no ?Over the past 2 weeks, have you felt down, depressed or hopeless?: yes ?Over the past 2 weeks, have you felt little interest or pleasure in doing things?:yes ? ? ?Gynecologic History ?No LMP recorded. (Menstrual status: Perimenopausal). ?Contraception: tubal ligation ?Last Pap: 2021. Results were: LGSIL.  Colpo:  LGSIL ?Last mammogram: 2012. Results were: normal ? ?Obstetric History ?OB History  ?Gravida Para Term Preterm AB Living  ?6 4 0 _0 ?SAB IAB Ectopic Multiple Live Births  ?2 0   0 3  ?  ?# Outcome Date GA Lbr Len/2nd Weight Sex Delivery Anes PTL Lv  ?6 Preterm 05/28/89 [redacted]w[redacted]d  F CS-LVertical  Y LIV  ?5 Preterm 04/19/88    M CS-LVertical  N LIV  ?4 Preterm 03/10/86 320w0d M CS-LTranv  Y LIV  ?   Birth Comments: Breech   ?3 Preterm 04/22/84 [id number] Vag-Spont Intrathecal Y   ?   Birth Comments: Twin, with one demise   ?2 SAB           ?1 SAB           ? ? ?Past Medical History:  ?Diagnosis Date  ? Anxiety   ? Arthritis   ? Asthma   ? Autoimmune hepatitis  (HCC[facility]okale[facility] ? Bipolar 1 disorder (HCCFarmer ? Breast lump   ? Depression   ? Diabetes mellitus   ? FH: chemotherapy   ? Gout   ? High cholesterol   ? Hypertension   ? Liver cirrhosis (HCC[facility]lin ? Lung nodules   ? Neuropathy   ? Panic disorder   ? Vaginal discharge 01/25/2017  ? Vertigo   ?  ?Past Surgical History:  ?Procedure Laterality Date  ? Biopsy of liver    ? CESAREAN SECTION    ? LAPAROSCOPY    ? TUBAL LIGATION    ?  ? ?Current Outpatient Medications:  ?  Accu-Chek Softclix Lancets lancets, Use as instructed to check blood sugar 3x a day, Disp: 300 each, Rfl: 3 ?  albuterol (VENTOLIN HFA) 108 (90 Base) MCG/ACT inhaler, INHALE ONE TO TWO puffs into THE lungs EVERY SIX HOURS AS NEEDED SHORTNESS OF BREATH, Disp: 18 g, Rfl: 12 ?  bacitracin ointment, Apply 1 application topically 2 (two) times daily., Disp: 120 g, Rfl: 0 ?  Blood Glucose Monitoring Suppl (ACCU-CHEK GUIDE ME) w/Device KIT, Use 3 times per day to check your blood sugar., Disp: 1 kit, Rfl: 0 ?  Capsaicin 0.025 % GEL, Apply 4 application  topically 3 (three) times daily., Disp: 60 g, Rfl: 3 ?  cyclobenzaprine (FLEXERIL) 10 MG tablet, Take 1 tablet (10 mg total) by mouth 2 (two) times daily as needed for muscle spasms., Disp: 10 tablet, Rfl: 0 ?  famotidine (PEPCID) 20 MG tablet, Take 1 tablet (20 mg total) by mouth 2 (two) times daily., Disp: 60 tablet, Rfl: 0 ?  fluticasone (FLONASE) 50 MCG/ACT nasal spray, Place 2 sprays into both nostrils daily as needed for allergies or rhinitis. , Disp: , Rfl:  ?  glucose blood (ACCU-CHEK GUIDE) test strip, Use as instructed to check blood sugar 3 times a day, Disp: 300 each, Rfl: 3 ?  hydrOXYzine (ATARAX/VISTARIL) 25 MG tablet, Take 1 tablet (25 mg total) by mouth every 6 (six) hours., Disp: 12 tablet, Rfl: 0 ?  insulin aspart (NOVOLOG) 100 UNIT/ML injection, Inject 5 Units into the skin 3 (three) times daily with meals., Disp: 30 mL, Rfl: 1 ?  LEVEMIR 100 UNIT/ML injection, Inject 15 Units into the skin at bedtime., Disp:  , Rfl:  ?  lisinopril (ZESTRIL) 10 MG tablet, Take 1 tablet (10 mg total) by mouth daily., Disp: 90 tablet, Rfl: 1 ?  mercaptopurine (PURINETHOL) 50 MG tablet, Take 50 mg by mouth daily. Give on an empty stomach 1 hour before or 2 hours after meals. Caution: Chemotherapy., Disp: , Rfl:  ?  omeprazole (PRILOSEC) 40 MG capsule, Take 40 mg by mouth daily., Disp: , Rfl:  ?  ondansetron (ZOFRAN ODT) 4 MG disintegrating tablet, Take 1 tablet (4 mg total) by mouth every 8 (eight) hours as needed for nausea or vomiting., Disp: 10 tablet, Rfl: 0 ?  predniSONE (DELTASONE) 20 MG tablet, Take 40 mg by mouth daily with breakfast., Disp: , Rfl:  ?  SOLIQUA 100-33 UNT-MCG/ML SOPN, Inject 15 Units into the skin daily., Disp: 15 mL, Rfl: 0 ?  triamcinolone cream (KENALOG) 0.1 %, Apply 1 application topically 2 (two) times daily., Disp: 30 g, Rfl: 0 ?  venlafaxine XR (EFFEXOR XR) 37.5 MG 24 hr capsule, Take 1 capsule (37.5 mg total) by mouth daily., Disp: 30 capsule, Rfl: 2 ?  metroNIDAZOLE (FLAGYL) 500 MG tablet, Take 1 tablet (500 mg total) by mouth 2 (two) times daily. (Patient not taking: Reported on 11/02/2021), Disp: 14 tablet, Rfl: 0 ?  oxyCODONE (ROXICODONE) 5 MG immediate release tablet, Take 1 tablet (5 mg total) by mouth every 8 (eight) hours as needed for up to 20 doses. (Patient not taking: Reported on 11/02/2021), Disp: 20 tablet, Rfl: 0 ?Allergies  ?Allergen Reactions  ? Amoxicillin Rash, Shortness Of Breath and Swelling  ? Augmentin [Amoxicillin-Pot Clavulanate] Anaphylaxis  ?  Has patient had a PCN reaction causing immediate rash, facial/tongue/throat swelling, SOB or lightheadedness with hypotension: Yes ?Has patient had a PCN reaction causing severe rash involving mucus membranes or skin necrosis: No ?Has patient had a PCN reaction that required hospitalization: No ?Has patient had a PCN reaction occurring within the last 10 years: Yes ?If all of the above answers are "NO", then may proceed with Cephalosporin use. ?   ? Tylenol [Acetaminophen] Other (See Comments)  ?  Patient has liver disease, MD has stated that [place].   ? Latex Rash  ?  ?Social History  ? ?Tobacco Use  ? Smoking status: Former  ?  Packs/day: 0.30  ?  Types: Cigarettes  ?  Quit date: 08/22/2020  ?  Years since quitting: 1.1  ? Smokeless tobacco: Never  ?  Tobacco comments:  ?  2-3 cigs/day  ?Substance Use Topics  ? Alcohol use: Not Currently  ?  Alcohol/week: 3.0 standard drinks  ?  Types: 3 Cans of beer per week  ?  ?Family History  ?Problem Relation Age of Onset  ? Liver disease Mother   ? Kidney failure Father   ? Heart disease Sister   ? Liver disease Daughter   ? Anxiety disorder Daughter   ? Heart disease Son   ? Colon cancer Neg Hx   ? Stomach cancer Neg Hx   ? Migraines Neg Hx   ? Headache Neg Hx   ?  ? ? ?Review of Systems ? ?Constitutional: negative for fatigue and weight loss ?Respiratory: negative for cough and wheezing ?Cardiovascular: negative for chest pain, fatigue and palpitations ?Gastrointestinal: negative for abdominal pain and change in bowel habits ?Musculoskeletal:negative for myalgias ?Neurological: negative for gait problems and tremors ?Behavioral/Psych:  positive for depression.  negative for abusive relationship ?Endocrine: negative for temperature intolerance    ?Genitourinary: positive for vaginal discharge and hot flashes.  negative for abnormal menstrual periods, genital lesions, and sexual problems  ?Integument/breast: negative for breast lump, breast tenderness, nipple discharge and skin lesion(s) ? ?  ?Objective:  ? ?    ?BP (!) 138/91   Pulse 89   Ht _0  (1.549 m)   Wt 192 lb 1.6 oz (87.1 kg)   BMI 36.30 kg/m?  ?General:   Alert and no distress  ?Skin:   no rash or abnormalities  ?Lungs:   clear to auscultation bilaterally  ?Heart:   regular rate and rhythm, S1, S2 normal, no murmur, click, rub or gallop  ?Breasts:   normal without suspicious masses, skin or nipple changes or axillary nodes   ?Abdomen:  normal findings: no organomegaly, soft, non-tender and no hernia  ?Pelvis:  External genitalia: normal general appearance ?Urinary system: urethral meatus normal and bladder without fullness, nonte

## 2021-11-02 NOTE — Progress Notes (Signed)
RGYN pt in office for annual exam. Pt c/o of itching and burning in the vaginal around. Pt states she has reoccurring yeast infections. Pt is requesting Rx for menopause.  ? ?Last mammogram: 09/2010 ? ?

## 2021-11-03 ENCOUNTER — Telehealth: Payer: Self-pay

## 2021-11-03 LAB — CERVICOVAGINAL ANCILLARY ONLY
Bacterial Vaginitis (gardnerella): NEGATIVE
Candida Glabrata: NEGATIVE
Candida Vaginitis: NEGATIVE
Chlamydia: NEGATIVE
Comment: NEGATIVE
Comment: NEGATIVE
Comment: NEGATIVE
Comment: NEGATIVE
Comment: NEGATIVE
Comment: NORMAL
Neisseria Gonorrhea: NEGATIVE
Trichomonas: NEGATIVE

## 2021-11-03 LAB — HEPB+HEPC+HIV PANEL
HIV Screen 4th Generation wRfx: NONREACTIVE
Hep B C IgM: NEGATIVE
Hep B Core Total Ab: NEGATIVE
Hep B E Ab: NEGATIVE
Hep B E Ag: NEGATIVE
Hep B Surface Ab, Qual: REACTIVE
Hep C Virus Ab: NONREACTIVE
Hepatitis B Surface Ag: NEGATIVE

## 2021-11-03 LAB — RPR: RPR Ser Ql: NONREACTIVE

## 2021-11-03 NOTE — Telephone Encounter (Signed)
Pt called in, advised results are still pending. ?

## 2021-11-03 NOTE — Telephone Encounter (Signed)
Returned call, no answer, no vm available ?

## 2021-11-07 LAB — CYTOLOGY - PAP
Comment: NEGATIVE
Diagnosis: NEGATIVE
High risk HPV: NEGATIVE

## 2021-11-08 ENCOUNTER — Telehealth: Payer: Self-pay | Admitting: Physical Medicine and Rehabilitation

## 2021-11-08 NOTE — Telephone Encounter (Signed)
Pt called and wants to come in to be seen about her neck pain.  ? ?CB [phone] ?

## 2021-11-09 ENCOUNTER — Ambulatory Visit (INDEPENDENT_AMBULATORY_CARE_PROVIDER_SITE_OTHER): Payer: Medicare Other | Admitting: Licensed Clinical Social Worker

## 2021-11-09 DIAGNOSIS — F439 Reaction to severe stress, unspecified: Secondary | ICD-10-CM | POA: Diagnosis not present

## 2021-11-10 ENCOUNTER — Ambulatory Visit: Payer: Medicare Other

## 2021-11-10 NOTE — BH Specialist Note (Signed)
Integrated Behavioral Health via Telemedicine Visit ? ?11/10/2021 ?Hannah Young ?[id number] ? ?Number of Integrated Behavioral Health Clinician visits: 1 ?Session Start time:  10:00am ?Session End time: 10:16am ?Total time in minutes: 16 mins via phone per pt request.  ? ?Referring Provider: Dr. Clearance Coots  ?Patient/Family location: Home  ?[facility]Main Provider location: [facility]  ?All persons participating in visit: Pt Hannah Young and LCSW A. Felton Clinton  ?Types of Service: Telephone visit and General Behavioral Integrated Care (BHI) ? ?I connected with Hannah Young and/or Hannah Young'Hannah n/a via  Telephone or Video Enabled Telemedicine Application  (Video is Caregility application) and verified that I am speaking with the correct person using two identifiers. Discussed confidentiality: Yes  ? ?I discussed the limitations of telemedicine and the availability of in person appointments.  Discussed there is a possibility of technology failure and discussed alternative modes of communication if that failure occurs. ? ?I discussed that engaging in this telemedicine visit, they consent to the provision of behavioral healthcare and the services will be billed under their insurance. ? ?Patient and/or legal guardian expressed understanding and consented to Telemedicine visit: Yes  ? ?Presenting Concerns: ?Patient and/or family reports the following symptoms/concerns: Situational stress  ?Duration of problem: one month; Severity of problem: mild ? ?Patient and/or Family'Hannah Strengths/Protective Factors: ?Sense of purpose ? ?Goals Addressed: ?Patient will: ? Reduce symptoms of: stress  ? Increase knowledge and/or ability of: stress reduction  ? Demonstrate ability to: Increase adequate support systems for patient/family ? ?Progress towards Goals: ?Ongoing ? ?Interventions: ?Interventions utilized:  Link to [employer] ?Standardized Assessments completed: Not Needed ? ?Patient and/or Family Response: Hannah Young responded well. Visit  with Hannah Young was limited in time due to appt with resource for apartment listing  ? ?Assessment: ?Patient currently experiencing situational stress. Hannah Young reports her apartment is not safe due to environmental factors.  ? ?Patient may benefit from community resources . ? ?Plan: ?Follow up with behavioral health clinician on : as needed  ?Behavioral recommendations: engage with community resources.  ?Referral(Hannah): [employer] Resources:  Housing ? ?I discussed the assessment and treatment plan with the patient and/or parent/guardian. They were provided an opportunity to ask questions and all were answered. They agreed with the plan and demonstrated an understanding of the instructions. ?  ?They were advised to call back or seek an in-person evaluation if the symptoms worsen or if the condition fails to improve as anticipated. ? ?Gwyndolyn Saxon, LCSW ?

## 2021-11-14 ENCOUNTER — Encounter: Payer: Medicare Other | Admitting: Internal Medicine

## 2021-11-15 ENCOUNTER — Inpatient Hospital Stay: Admission: RE | Admit: 2021-11-15 | Payer: Medicare Other | Source: Ambulatory Visit

## 2021-11-16 ENCOUNTER — Encounter: Payer: Medicare Other | Admitting: Internal Medicine

## 2021-11-17 ENCOUNTER — Telehealth: Payer: Self-pay

## 2021-11-17 ENCOUNTER — Ambulatory Visit: Payer: Medicare Other

## 2021-11-21 ENCOUNTER — Inpatient Hospital Stay: Admission: RE | Admit: 2021-11-21 | Payer: Medicare Other | Source: Ambulatory Visit

## 2021-11-22 ENCOUNTER — Encounter: Payer: Self-pay | Admitting: Internal Medicine

## 2021-11-22 ENCOUNTER — Encounter: Payer: Medicare Other | Admitting: Internal Medicine

## 2021-11-25 DIAGNOSIS — Z1152 Encounter for screening for COVID-19: Secondary | ICD-10-CM | POA: Diagnosis not present

## 2021-12-01 DIAGNOSIS — Z1152 Encounter for screening for COVID-19: Secondary | ICD-10-CM | POA: Diagnosis not present

## 2021-12-03 ENCOUNTER — Other Ambulatory Visit: Payer: Self-pay | Admitting: Internal Medicine

## 2021-12-03 DIAGNOSIS — I10 Essential (primary) hypertension: Secondary | ICD-10-CM

## 2021-12-09 ENCOUNTER — Encounter: Payer: Medicare Other | Admitting: Internal Medicine

## 2021-12-11 ENCOUNTER — Other Ambulatory Visit: Payer: Self-pay | Admitting: Internal Medicine

## 2021-12-11 ENCOUNTER — Telehealth: Payer: Self-pay | Admitting: Internal Medicine

## 2021-12-11 NOTE — Telephone Encounter (Signed)
?  Reason for call:  ?I received a call from [name] at 4:00 AM indicating that she was concerned about her blood sugar. She states that she  took her Novlog 5 units roughly an hour and her glucometer is reading "high". She states that she is feeling slightly dizzy but that improved with using the bathroom. Otherwise she, denies any symptoms. She states that she has been having trouble remembering to take her prescriptions is unsure if she took her other insulin Hannah Young) yesterday. ? ?Called patient back at 6:18 am to check up on her. She states that her blood sugar has improved some and she denies symptoms of hyperglycemia, She states that she has an appointment with her primary care doctor on Tuesday.  ? ?Assessment / Plan / Recommendations:  ?I counseled the patient to continue to check her blood sugar over the next few hours and if it remains significantly elevated, then take an additional 3 units of Novolog.  ?I counseled the patient regarding signs and symptoms of hyperglycemia.  ?Encouraged her to drink plenty of fluids. ?Instructed to keep appt for Tusesday ?As always, pt is advised that if symptoms worsen or new symptoms arise, they should go to an urgent care facility or to to ER for further evaluation. ? ?Signature: ?Chari Manning, D.O.  ?Internal Medicine Resident, PGY-3 ?[facility]  ?Pager: [phone] ?5:57 AM, 12/11/2021  ? ?

## 2021-12-12 ENCOUNTER — Ambulatory Visit: Payer: Medicare Other | Admitting: Licensed Clinical Social Worker

## 2021-12-12 NOTE — Chronic Care Management (AMB) (Signed)
?  Care Management  ? ?Social Work Visit Note ? ?12/12/2021 ?Name: Hannah Young MRN: [id number] DOB: April 22, 1966 ? ?Hannah Young is a 56 y.o. year old female who sees Aslam, Leanna Sato, MD for primary care. The care management team was consulted for assistance with care management and care coordination needs related to [facility] Resources   ? ?Patient was given the following information about care management and care coordination services today, agreed to services, and gave verbal consent: 1.care management/care coordination services include personalized support from designated clinical staff supervised by their physician, including individualized plan of care and coordination with other care providers 2. 24/7 contact phone numbers for assistance for urgent and routine care needs. 3. The patient may stop care management/care coordination services at any time by phone call to the office staff. ? ?Engaged with patient by telephone for initial visit in response to provider referral for social work chronic care management and care coordination services. ? ?Assessment: Review of patient history, allergies, and health status during evaluation of patient need for care management/care coordination services.   ? ?Interventions:  ?Patient interviewed and appropriate assessments performed ?Collaborated with clinical team regarding patient needs  ?Patient received assistance for housing through housing coalition. Patient is looking for a two-bedroom apartment for under $1000.  ?SW researched available apartments within that limit while on the call with patient. SW gave numbers to patient. Patient agreed to call apartments and follow up with SW.  ?Patient is attempting to vacate current apartment on 05/18.  ? ? ?   ? ?Plan:  ?Patient will contact apartments complexes for availability.  ?SW will follow up within 30 days.  ? ?Christen Butter, BSW, MSW  ?Social Worker ?[facility] Care Management  ?[phone] ?  ? ? ? ? ? ? ? ? ? ? ? ? ? ? ?

## 2021-12-12 NOTE — Patient Instructions (Signed)
Visit Information ? ?Instructions: patient will work with SW to address concerns related to housing ? ?Patient was given the following information about care management and care coordination services today, agreed to services, and gave verbal consent: 1.care management/care coordination services include personalized support from designated clinical staff supervised by their physician, including individualized plan of care and coordination with other care providers 2. 24/7 contact phone numbers for assistance for urgent and routine care needs. 3. The patient may stop care management/care coordination services at any time by phone call to the office staff. ? ?Patient verbalizes understanding of instructions and care plan provided today and agrees to view in MyChart. Active MyChart status confirmed with patient.   ? ?The care management team will reach out to the patient again over the next 30 days.  ? ?Christen Butter, BSW, MSW  ?Social Worker ?[facility] Care Management  ?[phone] ?  ? ?  ?

## 2021-12-13 ENCOUNTER — Encounter: Payer: Medicare Other | Admitting: Student

## 2021-12-14 ENCOUNTER — Encounter: Payer: Self-pay | Admitting: Internal Medicine

## 2021-12-16 ENCOUNTER — Encounter: Payer: Medicare Other | Admitting: Internal Medicine

## 2022-01-04 ENCOUNTER — Emergency Department (HOSPITAL_COMMUNITY)
Admission: EM | Admit: 2022-01-04 | Discharge: 2022-01-04 | Disposition: A | Discharge status: Home / Self Care | Payer: Medicare Other | Attending: Emergency Medicine | Admitting: Emergency Medicine

## 2022-01-04 ENCOUNTER — Other Ambulatory Visit: Payer: Self-pay

## 2022-01-04 ENCOUNTER — Encounter (HOSPITAL_COMMUNITY): Payer: Self-pay

## 2022-01-04 ENCOUNTER — Emergency Department (HOSPITAL_COMMUNITY): Payer: Medicare Other

## 2022-01-04 DIAGNOSIS — Z91040 Latex allergy status: Secondary | ICD-10-CM | POA: Insufficient documentation

## 2022-01-04 DIAGNOSIS — E119 Type 2 diabetes mellitus without complications: Secondary | ICD-10-CM | POA: Diagnosis not present

## 2022-01-04 DIAGNOSIS — M25562 Pain in left knee: Secondary | ICD-10-CM | POA: Insufficient documentation

## 2022-01-04 DIAGNOSIS — Z794 Long term (current) use of insulin: Secondary | ICD-10-CM | POA: Diagnosis not present

## 2022-01-04 DIAGNOSIS — M25552 Pain in left hip: Secondary | ICD-10-CM | POA: Insufficient documentation

## 2022-01-04 DIAGNOSIS — I10 Essential (primary) hypertension: Secondary | ICD-10-CM | POA: Insufficient documentation

## 2022-01-04 DIAGNOSIS — Z79899 Other long term (current) drug therapy: Secondary | ICD-10-CM | POA: Insufficient documentation

## 2022-01-04 DIAGNOSIS — M1612 Unilateral primary osteoarthritis, left hip: Secondary | ICD-10-CM | POA: Diagnosis not present

## 2022-01-04 LAB — URINALYSIS, ROUTINE W REFLEX MICROSCOPIC
Bacteria, UA: NONE SEEN
Bilirubin Urine: NEGATIVE
Glucose, UA: >=500 mg/dL — AB
Hgb urine dipstick: NEGATIVE
Ketones, ur: NEGATIVE mg/dL
Leukocytes,Ua: NEGATIVE
Nitrite: NEGATIVE
Protein, ur: NEGATIVE mg/dL
Specific Gravity, Urine: 1.024 (ref 1.005–1.030)
pH: 5 (ref 5.0–8.0)

## 2022-01-04 MED ORDER — OXYCODONE HCL 5 MG PO TABS
5.0000 mg | ORAL_TABLET | Freq: Once | ORAL | Status: AC
  Administered 2022-01-04: 5 mg via ORAL
  Filled 2022-01-04: qty 1

## 2022-01-04 NOTE — Discharge Instructions (Addendum)
Your xray results showed moderate arthritis, you were given a copy for your records.Please schedule an appointment with your orthopedist Dr. Erlinda Hong for further follow up.  Your urine did not show any signs of infection on today's visit.  Schedule follow-up with your primary care physician as needed.

## 2022-01-04 NOTE — ED Notes (Addendum)
Patient got dressed and refused discharge vitals. Pt stated "I got to go my ride is here". Pt was given AVS as she was walking toward the door.

## 2022-01-04 NOTE — ED Provider Notes (Signed)
[facility]PART[facility] Provider Note   CSN: [id number] Arrival date & time: 01/04/22  9735     History HTN, Anxiety, Autoimmune hepatitis, Panic Disorder.  Chief Complaint  Patient presents with   Hip Pain    Hannah Young is a 56 y.o. female.  56 y.o female with a PMH of DM, HTN, Liver cirrhosis, Autoimmune hepatitis, BL Knee Arthritis presents to the ED via [facility] with a chief complaint of left hip pain x 2 months. Patient  describes an ongoing throbbing sensation to the left hip exacerbated with laying down flat in bed. Has a known hx of knee arthritis, reports she cannot have intervention as her A1c has been continuously elevated.  She is unsure whether this is causing worsening pain to her left hip.  She reports noticing a "lump ", to her left hip that has been there for quite some time.  She did not seek care at her PCP at [facility] as she states "I would be sent here anyways ".  She has not tried taking any medication for improvement in symptoms due to her ongoing history of cirrhosis.  She is also endorsing some urinary frequency along with dysuria. No fever, no trauma, no abdominal pain or other complaints.   The history is provided by the patient.  Hip Pain This is a new problem. The current episode started more than 1 week ago. The problem occurs constantly. The problem has been gradually worsening. Pertinent negatives include no chest pain, no abdominal pain, no headaches and no shortness of breath. The symptoms are aggravated by standing. Nothing relieves the symptoms. She has tried nothing for the symptoms.      Home Medications Prior to Admission medications   Medication Sig Start Date End Date Taking? Authorizing Provider  Accu-Chek Softclix Lancets lancets Use as instructed to check blood sugar 3x a day 07/16/20   Aslam, Loralyn Freshwater, MD  albuterol (VENTOLIN HFA) 108 (90 Base) MCG/ACT inhaler INHALE ONE TO TWO puffs into THE lungs EVERY  SIX HOURS AS NEEDED SHORTNESS OF BREATH 09/06/21   Rehman, Areeg N, DO  bacitracin ointment Apply 1 application topically 2 (two) times daily. 02/04/21   CardamaGrayce Sessions, MD  Blood Glucose Monitoring Suppl (ACCU-CHEK GUIDE ME) w/Device KIT Use 3 times per day to check your blood sugar. 07/16/20   Harvie Heck, MD  Capsaicin 0.025 % GEL Apply 4 application topically 3 (three) times daily. 12/01/19   Maudie Mercury, MD  cyclobenzaprine (FLEXERIL) 10 MG tablet Take 1 tablet (10 mg total) by mouth 2 (two) times daily as needed for muscle spasms. 04/03/21   Pearson Forster, NP  famotidine (PEPCID) 20 MG tablet Take 1 tablet (20 mg total) by mouth 2 (two) times daily. 03/04/21   Palumbo, April, MD  fluticasone [facility]) 50 MCG/ACT nasal spray Place 2 sprays into both nostrils daily as needed for allergies or rhinitis.     [provider]  glucose blood (ACCU-CHEK GUIDE) test strip Use as instructed to check blood sugar 3 times a day 07/16/20   Harvie Heck, MD  hydrOXYzine (ATARAX/VISTARIL) 25 MG tablet Take 1 tablet (25 mg total) by mouth every 6 (six) hours. 04/03/21   Pearson Forster, NP  insulin aspart (NOVOLOG) 100 UNIT/ML injection Inject 5 Units into the skin 3 (three) times daily with meals. 09/29/21   Harvie Heck, MD  LEVEMIR 100 UNIT/ML injection Inject 15 Units into the skin at bedtime. 01/25/21   [provider]  lisinopril (ZESTRIL) 10 MG tablet Take 1 tablet (10 mg total) by mouth daily. 12/05/21   Harvie Heck, MD  mercaptopurine (PURINETHOL) 50 MG tablet Take 50 mg by mouth daily. Give on an empty stomach 1 hour before or 2 hours after meals. Caution: Chemotherapy.    [provider]  metroNIDAZOLE (FLAGYL) 500 MG tablet Take 1 tablet (500 mg total) by mouth 2 (two) times daily. Patient not taking: Reported on 11/02/2021 12/13/20   Aris Lot, MD  omeprazole (PRILOSEC) 40 MG capsule Take 40 mg by mouth daily. 06/28/20   [provider]  ondansetron  (ZOFRAN ODT) 4 MG disintegrating tablet Take 1 tablet (4 mg total) by mouth every 8 (eight) hours as needed for nausea or vomiting. 05/28/21   Caccavale, Sophia, PA-C  oxyCODONE (ROXICODONE) 5 MG immediate release tablet Take 1 tablet (5 mg total) by mouth every 8 (eight) hours as needed for up to 20 doses. Patient not taking: Reported on 11/02/2021 02/23/21   Atway, Rayann N, DO  predniSONE (DELTASONE) 20 MG tablet Take 40 mg by mouth daily with breakfast.    [provider]  SOLIQUA 100-33 UNT-MCG/ML SOPN Inject 15 Units into the skin daily. 10/04/21   Harvie Heck, MD  triamcinolone cream (KENALOG) 0.1 % Apply 1 application topically 2 (two) times daily. 04/03/21   Pearson Forster, NP  venlafaxine XR (EFFEXOR-XR) 37.5 MG 24 hr capsule Take 1 capsule (37.5 mg total) by mouth daily. 11/03/21 02/01/22  Harvie Heck, MD      Allergies    Amoxicillin, Augmentin [amoxicillin-pot clavulanate], Tylenol [acetaminophen], and Latex    Review of Systems   Review of Systems  Constitutional:  Negative for fever.  HENT:  Negative for sore throat.   Respiratory:  Negative for shortness of breath.   Cardiovascular:  Negative for chest pain.  Gastrointestinal:  Negative for abdominal pain, nausea and vomiting.  Genitourinary:  Positive for dysuria and frequency. Negative for difficulty urinating.  Musculoskeletal:  Positive for arthralgias.  Neurological:  Negative for headaches.  All other systems reviewed and are negative.  Physical Exam Updated Vital Signs BP (!) 151/119 (BP Location: Right Arm)   Pulse 78   Temp 98.4 F (36.9 C) (Oral)   Resp (!) 22   Ht 5' (1.524 m)   SpO2 97%   BMI 37.52 kg/m  Physical Exam Vitals and nursing note reviewed.  Constitutional:      Appearance: Normal appearance. She is not ill-appearing or toxic-appearing.  HENT:     Head: Normocephalic and atraumatic.  Eyes:     General: No scleral icterus. Cardiovascular:     Rate and Rhythm: Normal rate.   Pulmonary:     Effort: Pulmonary effort is normal.     Breath sounds: No wheezing.  Abdominal:     General: Abdomen is flat.     Palpations: Abdomen is soft.     Tenderness: There is no abdominal tenderness. There is no right CVA tenderness or left CVA tenderness.  Musculoskeletal:        General: Tenderness present. No deformity.     Cervical back: Normal range of motion and neck supple.     Left hip: Tenderness and bony tenderness present. No deformity or lacerations. Normal range of motion. Normal strength.     Left knee: Normal range of motion. Tenderness present.     Right lower leg: No edema.     Left lower leg: No edema.     Comments: Full  ROM with hip extension and flexion. Full ROM at the knee level. Pulses present, strength 5/5. NO visible signs of trauma. No skin changes.   Skin:    General: Skin is warm and dry.  Neurological:     Mental Status: She is alert and oriented to person, place, and time.    ED Results / Procedures / Treatments   Labs (all labs ordered are listed, but only abnormal results are displayed) Labs Reviewed  URINALYSIS, ROUTINE W REFLEX MICROSCOPIC - Abnormal; Notable for the following components:      Result Value   Glucose, UA >=500 (*)    All other components within normal limits    EKG None  Radiology DG Hip Unilat W or Wo Pelvis 2-3 Views Left  Result Date: 01/04/2022 CLINICAL DATA:  Pain. EXAM: DG HIP (WITH OR WITHOUT PELVIS) 2-3V LEFT COMPARISON:  None. FINDINGS: No signs of acute fracture or dislocation. Moderate bilateral hip osteoarthritis. Degenerative changes noted at the pubic symphysis. IMPRESSION: 1. No acute findings. 2. Moderate bilateral hip osteoarthritis. Electronically Signed   By: Kerby Moors M.D.   On: 01/04/2022 09:46    Procedures Procedures    Medications Ordered in ED Medications  oxyCODONE (Oxy IR/ROXICODONE) immediate release tablet 5 mg (5 mg Oral Given 01/04/22 6270)    ED Course/ Medical Decision  Making/ A&P                           Medical Decision Making Amount and/or Complexity of Data Reviewed Labs: ordered. Radiology: ordered.  Risk Prescription drug management.   Patient presents to the ED with ongoing left hip pain for the past 2 months.  No previous intervention by PCP, has not taken medication for improvement in symptoms.  She is ambulatory with a steady gait.  Reports there is "a lump "on her left hip exacerbated when she lies flat.  Differential diagnoses included but not limited to colitis, osteomyelitis, septic joint versus arthritis.  On evaluation there is no changes in the skin, no erythema or streaking to suggest infection.  She has full range of motion with hip extension and flexion 5 out of 5 strength, patient is intact throughout.  Arrived to the ED afebrile, has been ongoing for several months, I have a lower suspicion for septic joint.  Denies any trauma, there is no bruising, or visible hematoma noted.  There is pain with palpation. Cirrhosis, unable to take Tylenol.  I did discuss with her gaining x-ray to rule out any worsening arthritis at this time.  Given oxy ir for temporary relief.  She is also endorsing some urinary frequency, known diabetic we will check UA on today's visit.  Xray of her left hip with moderate osteoarthritis.  Also endorsing ongoing problems with hair loss, I discussed with her that I did not visualize any signs of infection, fungus to be treated on today's visit.  Continue follow-up.  Extensive chart review does reveal she has had this issue for several years.    UA with no nitrites, leukocytes or signs of infection.  This was given to patient.  She is ambulatory from the emergency department with a steady gait.  Patient stable for outpatient follow-up with orthopedist.   Portions of this note were generated with Dragon dictation software. Dictation errors may occur despite best attempts at proofreading.   Final Clinical  Impression(s) / ED Diagnoses Final diagnoses:  Left hip pain    Rx / DC  Orders ED Discharge Orders     None         Janeece Fitting, Hershal Coria 01/04/22 1126    Lajean Saver, MD 01/05/22 [phone]

## 2022-01-04 NOTE — ED Triage Notes (Signed)
Pt arrived POV from home c/o a lump on her left hip x1 month and now it hurts really bad. Pt thought it would go away but it did not.

## 2022-01-06 ENCOUNTER — Telehealth (HOSPITAL_COMMUNITY): Payer: Self-pay

## 2022-01-12 ENCOUNTER — Ambulatory Visit: Payer: Medicare Other | Admitting: Licensed Clinical Social Worker

## 2022-01-12 NOTE — Chronic Care Management (AMB) (Signed)
  Care Management   Social Work Visit Note  01/12/2022 Name: Hannah Young MRN: [id number] DOB: [birth year]  Hannah Young is a 56 y.o. year old female who sees Aslam, Loralyn Freshwater, MD for primary care. The care management team was consulted for assistance with care management and care coordination needs related to Financial Difficulties related to housing     Patient was given the following information about care management and care coordination services today, agreed to services, and gave verbal consent: 1.care management/care coordination services include personalized support from designated clinical staff supervised by their physician, including individualized plan of care and coordination with other care providers 2. 24/7 contact phone numbers for assistance for urgent and routine care needs. 3. The patient may stop care management/care coordination services at any time by phone call to the office staff.  Engaged with patient by telephone for follow up visit in response to provider referral for social work chronic care management and care coordination services.  Assessment: Review of patient history, allergies, and health status during evaluation of patient need for care management/care coordination services.    Interventions:  Patient interviewed and appropriate assessments performed Collaborated with clinical team regarding patient needs  Patient continues to search for housing. Patient states Current housing condition is making the family and her ill.  Patient is working with an attorney to address legal concerns. Patient will contact SW when housing is found so SW can research resources for financial assistance.      Plan:  Per patient request, patient will contact SW when housing is found. At that time, SW will assist with contacting [employer] to assist with moving expenses.   Lenor Derrick, MSW  Social Worker [facility] Care Management  [phone]

## 2022-01-12 NOTE — Patient Instructions (Signed)
Visit Information  Instructions: patient will work with SW to address concerns related to housing.  Patient was given the following information about care management and care coordination services today, agreed to services, and gave verbal consent: 1.care management/care coordination services include personalized support from designated clinical staff supervised by their physician, including individualized plan of care and coordination with other care providers 2. 24/7 contact phone numbers for assistance for urgent and routine care needs. 3. The patient may stop care management/care coordination services at any time by phone call to the office staff.  Patient verbalizes understanding of instructions and care plan provided today and agrees to view in [place]. Active MyChart status and patient understanding of how to access instructions and care plan via MyChart confirmed with patient.     The patient will call SW* as advised to when housing is secured.   Lenor Derrick , MSW Social Worker [facility] Care Management  [phone]

## 2022-01-21 ENCOUNTER — Telehealth: Payer: Self-pay | Admitting: Internal Medicine

## 2022-01-21 NOTE — Telephone Encounter (Signed)
  [facility] URGENT LINE DOCUMENTATION  Patient name: Hannah Young  DOB: [birth year] MRN: [id number]  Reason for call:   Pt called regarding left leg pain.  Location: left hip/knee Onset: 2-3 mo, worsened over past 24h Associations: became upset when asked if she was having fevers, saying that she is in too much pain to care about that. she is unable to verbalize whether there is redness or swelling.  Aggravating factors: fall, she became upset upon me asking why she fell. Alleviating factors: n/a  Comments: reports "urinating all over the place". When I asked if this was due to inability to ambulate vs inability to control her bladder, she became upset, saying that she was just going to continue to deal with the pain and she subsequently hung up.    Assessment/Plan   Due to her unwillingness to complete the conversation and generally likely some low health literacy, I was unable to determine if her complaints require urgent evaluation vs routine. Due to this, I recommended she seek evaluation in the ED over the weekend if there is erythema/edema or if she is developing fevers. If not, I recommended she arrange an acute visit in our clinic for further evaluation.   As always, patients are encouraged to seek further evaluation in the ED or urgent care if symptoms worsen prior to being seen in the clinic. Patients are encouraged to use this line for urgent needs only and advice does not replace a formal evaluation by a medical provider. If symptoms are considered life threatening, they are encouraged to call 911. For all other non-urgent needs, they are encouraged to call between regular clinic hours.  Elige Radon, MD Internal Medicine Resident PGY-3 [facility] 01/21/2022 12:32 PM

## 2022-01-25 ENCOUNTER — Other Ambulatory Visit: Payer: Self-pay | Admitting: Internal Medicine

## 2022-01-25 DIAGNOSIS — F32A Depression, unspecified: Secondary | ICD-10-CM

## 2022-01-25 DIAGNOSIS — E119 Type 2 diabetes mellitus without complications: Secondary | ICD-10-CM

## 2022-01-25 DIAGNOSIS — Z794 Long term (current) use of insulin: Secondary | ICD-10-CM

## 2022-01-25 NOTE — Telephone Encounter (Signed)
Patient needs to have Novolog Insulin changed to Humalog for Insurance coverage if possible.  Needs new prescription for the Levimer.  Does not need script for the [facility].

## 2022-01-30 MED ORDER — LEVEMIR 100 UNIT/ML ~~LOC~~ SOLN: 15.0000 [IU] | Freq: Every day | SUBCUTANEOUS | 1 refills | Status: DC

## 2022-01-30 MED ORDER — INSULIN LISPRO 100 UNIT/ML IJ SOLN: 3.0000 [IU] | Freq: Three times a day (TID) | INTRAMUSCULAR | 2 refills | Status: DC

## 2022-01-30 NOTE — Telephone Encounter (Signed)
Rx has been sent to patient's pharmacy with requested changes from Novolog to Humalog and Levemir refill.

## 2022-02-01 ENCOUNTER — Ambulatory Visit: Payer: Self-pay

## 2022-02-01 NOTE — Chronic Care Management (AMB) (Signed)
   02/01/2022  [name] October 28, 1965 [id number]  Care coordination case closed. Jodelle Gross, RN, BSN, CCM Care Management Coordinator [facility] Internal Medicine Phone: [phone]/Fax: [phone]

## 2022-02-02 ENCOUNTER — Encounter: Payer: Self-pay | Admitting: Orthopaedic Surgery

## 2022-02-02 ENCOUNTER — Ambulatory Visit (INDEPENDENT_AMBULATORY_CARE_PROVIDER_SITE_OTHER): Payer: Medicare Other

## 2022-02-02 ENCOUNTER — Ambulatory Visit (INDEPENDENT_AMBULATORY_CARE_PROVIDER_SITE_OTHER): Payer: Medicare Other | Admitting: Orthopaedic Surgery

## 2022-02-02 VITALS — Ht 61.0 in | Wt 185.0 lb

## 2022-02-02 DIAGNOSIS — M1712 Unilateral primary osteoarthritis, left knee: Secondary | ICD-10-CM | POA: Insufficient documentation

## 2022-02-02 DIAGNOSIS — M1612 Unilateral primary osteoarthritis, left hip: Secondary | ICD-10-CM | POA: Diagnosis not present

## 2022-02-02 MED ORDER — CELECOXIB 200 MG PO CAPS: 200.0000 mg | ORAL_CAPSULE | Freq: Two times a day (BID) | ORAL | 3 refills | Status: DC
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
## 2022-02-11 ENCOUNTER — Other Ambulatory Visit: Payer: Self-pay

## 2022-02-11 ENCOUNTER — Encounter (HOSPITAL_COMMUNITY): Payer: Self-pay

## 2022-02-11 ENCOUNTER — Emergency Department (HOSPITAL_COMMUNITY)
Admission: EM | Admit: 2022-02-11 | Discharge: 2022-02-11 | Disposition: A | Discharge status: Home / Self Care | Payer: Medicare Other | Attending: Emergency Medicine | Admitting: Emergency Medicine

## 2022-02-11 ENCOUNTER — Emergency Department (HOSPITAL_COMMUNITY): Payer: Medicare Other

## 2022-02-11 DIAGNOSIS — M79672 Pain in left foot: Secondary | ICD-10-CM | POA: Diagnosis not present

## 2022-02-11 DIAGNOSIS — X58XXXA Exposure to other specified factors, initial encounter: Secondary | ICD-10-CM | POA: Diagnosis not present

## 2022-02-11 DIAGNOSIS — Z79899 Other long term (current) drug therapy: Secondary | ICD-10-CM | POA: Diagnosis not present

## 2022-02-11 DIAGNOSIS — Z794 Long term (current) use of insulin: Secondary | ICD-10-CM | POA: Diagnosis not present

## 2022-02-11 DIAGNOSIS — S92325A Nondisplaced fracture of second metatarsal bone, left foot, initial encounter for closed fracture: Secondary | ICD-10-CM | POA: Diagnosis not present

## 2022-02-11 DIAGNOSIS — S99922A Unspecified injury of left foot, initial encounter: Secondary | ICD-10-CM | POA: Diagnosis present

## 2022-02-11 MED ORDER — OXYCODONE HCL 5 MG PO TABS
5.0000 mg | ORAL_TABLET | Freq: Once | ORAL | Status: AC
  Administered 2022-02-11: 5 mg via ORAL
  Filled 2022-02-11: qty 1

## 2022-02-11 MED ORDER — OXYCODONE HCL 5 MG PO TABS: 5.0000 mg | ORAL_TABLET | Freq: Three times a day (TID) | ORAL | 0 refills | Status: DC | PRN

## 2022-02-11 NOTE — ED Triage Notes (Signed)
Pt reports "a lot of pain in my foot." Pt reports left foot. Pt states she needs a left hip replacement and bilateral knee replacement. Pt states she currently takes chemo. Pt states she is not sure why she is taking the chemo. Pt states it should be "in the chart." Pt has taken chemo off/on for 6 years. Pt reports left foot swollen.

## 2022-02-11 NOTE — ED Provider Notes (Addendum)
[facility] DEPT Provider Note   CSN: [id number] Arrival date & time: 02/11/22  1204     History  No chief complaint on file.   Hannah Young is a 56 y.o. female.  HPI     56 year old female comes in with chief complaint of foot swelling.  Patient indicates that she has had swelling of her left foot for the last 3 to 4 weeks.  The swelling and the pain has gotten worse.  She denies any specific traumatic incident which she recalls, but she has bad hip and knee, therefore she often trips when she is walking.  There is no history of DVT, PE.  Home Medications Prior to Admission medications   Medication Sig Start Date End Date Taking? Authorizing Provider  oxyCODONE (ROXICODONE) 5 MG immediate release tablet Take 1 tablet (5 mg total) by mouth every 8 (eight) hours as needed for severe pain. 02/11/22  Yes Varney Biles, MD  Accu-Chek Softclix Lancets lancets Use as instructed to check blood sugar 3x a day 07/16/20   Aslam, Loralyn Freshwater, MD  albuterol (VENTOLIN HFA) 108 (90 Base) MCG/ACT inhaler INHALE ONE TO TWO puffs into THE lungs EVERY SIX HOURS AS NEEDED SHORTNESS OF BREATH 09/06/21   Rehman, Areeg N, DO  bacitracin ointment Apply 1 application topically 2 (two) times daily. 02/04/21   CardamaGrayce Sessions, MD  Blood Glucose Monitoring Suppl (ACCU-CHEK GUIDE ME) w/Device KIT Use 3 times per day to check your blood sugar. 07/16/20   Harvie Heck, MD  Capsaicin 0.025 % GEL Apply 4 application topically 3 (three) times daily. 12/01/19   Maudie Mercury, MD  celecoxib (CELEBREX) 200 MG capsule Take 1 capsule (200 mg total) by mouth 2 (two) times daily. 02/02/22   Leandrew Koyanagi, MD  cyclobenzaprine (FLEXERIL) 10 MG tablet Take 1 tablet (10 mg total) by mouth 2 (two) times daily as needed for muscle spasms. 04/03/21   Pearson Forster, NP  famotidine (PEPCID) 20 MG tablet Take 1 tablet (20 mg total) by mouth 2 (two) times daily. 03/04/21   Palumbo, April, MD  fluticasone  [facility]) 50 MCG/ACT nasal spray Place 2 sprays into both nostrils daily as needed for allergies or rhinitis.     [provider]  glucose blood (ACCU-CHEK GUIDE) test strip Use as instructed to check blood sugar 3 times a day 07/16/20   Harvie Heck, MD  hydrOXYzine (ATARAX/VISTARIL) 25 MG tablet Take 1 tablet (25 mg total) by mouth every 6 (six) hours. 04/03/21   Pearson Forster, NP  insulin lispro (HUMALOG) 100 UNIT/ML injection Inject 0.03 mLs (3 Units total) into the skin 3 (three) times daily with meals. 01/30/22   Harvie Heck, MD  LEVEMIR 100 UNIT/ML injection Inject 0.15 mLs (15 Units total) into the skin at bedtime. 01/30/22   Harvie Heck, MD  lisinopril (ZESTRIL) 10 MG tablet Take 1 tablet (10 mg total) by mouth daily. 12/05/21   Harvie Heck, MD  mercaptopurine (PURINETHOL) 50 MG tablet Take 50 mg by mouth daily. Give on an empty stomach 1 hour before or 2 hours after meals. Caution: Chemotherapy.    [provider]  metroNIDAZOLE (FLAGYL) 500 MG tablet Take 1 tablet (500 mg total) by mouth 2 (two) times daily. 12/13/20   Aris Lot, MD  omeprazole (PRILOSEC) 40 MG capsule Take 40 mg by mouth daily. 06/28/20   [provider]  ondansetron (ZOFRAN ODT) 4 MG disintegrating tablet Take 1 tablet (4 mg total) by mouth every  8 (eight) hours as needed for nausea or vomiting. 05/28/21   Caccavale, Sophia, PA-C  predniSONE (DELTASONE) 20 MG tablet Take 40 mg by mouth daily with breakfast.    [provider]  SOLIQUA 100-33 UNT-MCG/ML SOPN Inject 15 Units into the skin daily. 01/30/22   Harvie Heck, MD  triamcinolone cream (KENALOG) 0.1 % Apply 1 application topically 2 (two) times daily. 04/03/21   Pearson Forster, NP  venlafaxine XR (EFFEXOR-XR) 37.5 MG 24 hr capsule TAKE ONE CAPSULE BY MOUTH EVERY DAY 01/30/22   Harvie Heck, MD      Allergies    Amoxicillin, Augmentin [amoxicillin-pot clavulanate], Tylenol [acetaminophen], and Latex    Review of Systems    Review of Systems  All other systems reviewed and are negative.   Physical Exam Updated Vital Signs BP (!) 148/100 (BP Location: Right Arm)   Pulse 77   Temp 98.5 F (36.9 C) (Oral)   Resp 18   Ht _0  (1.549 m)   Wt 81.6 kg   SpO2 100%   BMI 34.01 kg/m  Physical Exam Vitals and nursing note reviewed.  Constitutional:      Appearance: She is well-developed.  HENT:     Head: Atraumatic.  Cardiovascular:     Rate and Rhythm: Normal rate.  Pulmonary:     Effort: Pulmonary effort is normal.  Musculoskeletal:        General: Swelling and tenderness present. No deformity.     Cervical back: Normal range of motion and neck supple.     Comments: Patient has swelling and tenderness over the left midfoot dorsally, with tenderness to palpation.  Mild warmth to touch  Skin:    General: Skin is warm and dry.  Neurological:     Mental Status: She is alert and oriented to person, place, and time.     ED Results / Procedures / Treatments   Labs (all labs ordered are listed, but only abnormal results are displayed) Labs Reviewed - No data to display  EKG None  Radiology DG Foot Complete Left  Result Date: 02/11/2022 CLINICAL DATA:  Dorsal foot pain recently. Patient reports that she falls a lot. EXAM: LEFT FOOT - COMPLETE 3+ VIEW COMPARISON:  Radiographs 01/16/2017. FINDINGS: The bones appear mildly demineralized. On the lateral view, there is new cortical irregularity along the dorsal aspect of one of the metatarsals. This likely corresponds with a suspected nondisplaced fracture of the 2nd metatarsal on the PA view. No definite corresponding abnormality on the oblique view. No other evidence of acute fracture or dislocation. The joint spaces are preserved. There does appear to be some forefoot soft tissue swelling. IMPRESSION: Suspected nondisplaced fracture of the 2nd metatarsal neck. Electronically Signed   By: Richardean Sale M.D.   On: 02/11/2022 13:29     Procedures Procedures    Medications Ordered in ED Medications  oxyCODONE (Oxy IR/ROXICODONE) immediate release tablet 5 mg (5 mg Oral Given 02/11/22 1432)    ED Course/ Medical Decision Making/ A&P Clinical Course as of 02/11/22 1529  Sat Feb 11, 2022  1528 Patient's x-ray was visualized and interpreted independently.  She has a nondisplaced fracture of the second metatarsal.  Results were discussed with the patient.  CAM Walker boot applied.  Appropriate follow-up instructions provided.  Pain addressed as well. [AN]    Clinical Course User Index [AN] Varney Biles, MD  Medical Decision Making Risk Prescription drug management.   56 year old female comes in with chief complaint of left-sided foot pain.  Pain has been present now for 3 weeks.  No specific trauma.  On exam, the midfoot is edematous and erythematous without any significant warmth to touch.  There is significant tenderness to palpation.  Differential diagnosis includes Charcot's foot, occult fracture, ligamentous injury/foot sprain, cellulitis.  No evidence of leg swelling or calf tenderness, DVT is low on the differential.  Final Clinical Impression(s) / ED Diagnoses Final diagnoses:  Closed nondisplaced fracture of second metatarsal bone of left foot, initial encounter    Rx / DC Orders ED Discharge Orders          Ordered    oxyCODONE (ROXICODONE) 5 MG immediate release tablet  Every 8 hours PRN        02/11/22 Winchester, Kennis Wissmann, MD 02/11/22 1528    Varney Biles, MD 02/11/22 1529

## 2022-02-11 NOTE — Progress Notes (Signed)
Orthopedic Tech Progress Note Patient Details:  Hannah Young [birth year] [id number]  Ortho Devices Type of Ortho Device: CAM walker Ortho Device/Splint Location: left Ortho Device/Splint Interventions: Application   Post Interventions Patient Tolerated: Well, Ambulated well Instructions Provided: Care of device  Saul Fordyce 02/11/2022, 2:32 PM

## 2022-02-11 NOTE — Discharge Instructions (Signed)
You are seen in the ER for foot pain.  The x-ray confirms that you have a fracture to your foot, that is likely contributing to the swelling and discomfort you are having.   Please call the podiatrist at the number provided for a follow-up visit.  Take the pain medication for severe pain only. Utilize the cam walker boot that was provided in the ER, leave it on anytime you are ambulating or bearing weight.

## 2022-02-15 ENCOUNTER — Encounter: Payer: Medicare Other | Admitting: Student

## 2022-02-22 ENCOUNTER — Ambulatory Visit (INDEPENDENT_AMBULATORY_CARE_PROVIDER_SITE_OTHER): Payer: Medicare Other | Admitting: Orthopaedic Surgery

## 2022-02-22 ENCOUNTER — Ambulatory Visit (INDEPENDENT_AMBULATORY_CARE_PROVIDER_SITE_OTHER): Payer: Medicare Other

## 2022-02-22 DIAGNOSIS — S92345A Nondisplaced fracture of fourth metatarsal bone, left foot, initial encounter for closed fracture: Secondary | ICD-10-CM | POA: Diagnosis not present

## 2022-02-22 DIAGNOSIS — E559 Vitamin D deficiency, unspecified: Secondary | ICD-10-CM | POA: Diagnosis not present

## 2022-02-22 MED ORDER — TRAMADOL HCL 50 MG PO TABS: 50.0000 mg | ORAL_TABLET | Freq: Four times a day (QID) | ORAL | 2 refills | Status: DC | PRN
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
## 2022-02-23 LAB — VITAMIN D 25 HYDROXY (VIT D DEFICIENCY, FRACTURES): Vit D, 25-Hydroxy: 18 ng/mL — ABNORMAL LOW (ref 30–100)

## 2022-02-23 MED ORDER — VITAMIN D (ERGOCALCIFEROL) 50000 UNITS PO CAPS: 50000.0000 [IU] | ORAL_CAPSULE | ORAL | 0 refills | Status: AC

## 2022-02-23 NOTE — Progress Notes (Signed)
Can you let her know that vitamin d was low and that I have sent in supplementation to take once per week for 6 weeks

## 2022-02-23 NOTE — Progress Notes (Signed)
Notified patient.

## 2022-02-24 ENCOUNTER — Telehealth: Payer: Self-pay

## 2022-02-24 NOTE — Telephone Encounter (Signed)
RTC to [employer].  Suggestion to start patient on a Statin to prevent heart attacks if possible.

## 2022-02-24 NOTE — Progress Notes (Signed)
thanks

## 2022-02-24 NOTE — Telephone Encounter (Signed)
Hannah Young with [facility] requesting to speak with a nurse about medication. Please call back.

## 2022-02-27 ENCOUNTER — Telehealth: Payer: Self-pay | Admitting: Orthopaedic Surgery

## 2022-02-27 ENCOUNTER — Other Ambulatory Visit: Payer: Self-pay | Admitting: Physician Assistant

## 2022-02-27 MED ORDER — OXYCODONE HCL 5 MG PO TABS: 5.0000 mg | ORAL_TABLET | Freq: Three times a day (TID) | ORAL | 0 refills | Status: DC | PRN

## 2022-02-27 NOTE — Telephone Encounter (Signed)
Tried to call patient. Just rings. No voicemail.

## 2022-02-27 NOTE — Telephone Encounter (Signed)
I have sent in oxycodone.  Looks like she is allergic to tylenol so cannot do tylenol #3, and norco has tylenol mixed in so that is excluded.

## 2022-02-27 NOTE — Telephone Encounter (Signed)
Patient is request a different pain medication being Tramadol is making her groggy and tired and is not working as well, please call and advise

## 2022-03-09 ENCOUNTER — Telehealth: Payer: Self-pay | Admitting: Orthopaedic Surgery

## 2022-03-09 ENCOUNTER — Other Ambulatory Visit: Payer: Self-pay | Admitting: Physician Assistant

## 2022-03-09 NOTE — Progress Notes (Deleted)
Vitamin D deficiency- 18 on 02/22/22 -50,000 u weekly 4 weeks recheck? HTN -lisinopril 10 mg daily  Autoimmune hepatitis -prednisone 40 mg daily(need bactrim prophy?) -mercaptopurine 50 mg daily  Multiple lung nodules- last CT 2017 need repeat non con CT chest  T2DM- last A1c 9.2 in 10/20/20. Need lipid panel, LDL 142 in 2011  OA L hip and L knee  Patient complains of continued hair loss.  She states that her scalp is constantly itching and often has a burning sensation.  She has used antifungal creams before, but this itching and burning is different from previous episodes.  The hair loss has lead her to not like going out in public. A/P: Patient states that she has self-discontinued mercaptopurine a few months ago for her autoimmune hepatitis due to concern that it could be making her feel worse.  Itchiness and burning have not improved since then.  She also states that she has had continued hair loss and breakage.  She feels that her symptoms are related to mildew and mold in her house.  This could be the source of her symptoms, but I would also like to work-up further next month when patient returns.  Previously worked up thyroid disease which was wnl.  This could be age-related or possibly related to traction alopecia.  -Started hydroxyzine 25 mg prn for itching -Wrote letter for patient to give to [facility] [employer]  HCM colonoscopy

## 2022-03-09 NOTE — Telephone Encounter (Signed)
Patient called needing Rx refilled Oxycodone. Patient said the pharmacy sent a message that patient does not have any other refills. Patient said her other left hip is started bothering her as well. The number to contact patient is [phone]

## 2022-03-10 ENCOUNTER — Other Ambulatory Visit: Payer: Self-pay | Admitting: Internal Medicine

## 2022-03-10 ENCOUNTER — Other Ambulatory Visit: Payer: Self-pay | Admitting: Physician Assistant

## 2022-03-10 MED ORDER — OXYCODONE HCL 5 MG PO TABS: ORAL_TABLET | ORAL | 0 refills | Status: DC

## 2022-03-10 NOTE — Telephone Encounter (Signed)
Tried to call patient. Mail box is full

## 2022-03-10 NOTE — Telephone Encounter (Signed)
I sent in with decreased frequency.  This is last rx for a narcotic that we can send in

## 2022-03-14 ENCOUNTER — Telehealth: Payer: Self-pay | Admitting: Orthopaedic Surgery

## 2022-03-14 ENCOUNTER — Other Ambulatory Visit: Payer: Self-pay | Admitting: Orthopaedic Surgery

## 2022-03-14 NOTE — Telephone Encounter (Signed)
Patient called asked if she can get a call concerning her surgery on her hip. Patient asked if she can go under general anesthesia. Patient said she is afraid to be put to sleep. The number to contact patient is [phone]

## 2022-03-14 NOTE — Telephone Encounter (Signed)
Tried to reach patient again. No answer. No voicemail.

## 2022-03-14 NOTE — Telephone Encounter (Signed)
What surgery is she referring to?

## 2022-03-14 NOTE — Telephone Encounter (Signed)
Tried to call. No answer. No voicemail. Will try again later.

## 2022-03-15 NOTE — Telephone Encounter (Signed)
Tried to call patient again. No answer. No voicemail. Closing note.

## 2022-03-27 ENCOUNTER — Other Ambulatory Visit: Payer: Self-pay | Admitting: Internal Medicine

## 2022-03-27 NOTE — Telephone Encounter (Signed)
Next appt scheduled 04/03/22 with PCP.

## 2022-03-28 ENCOUNTER — Other Ambulatory Visit: Payer: Self-pay | Admitting: Physician Assistant

## 2022-03-28 ENCOUNTER — Telehealth: Payer: Self-pay | Admitting: Orthopaedic Surgery

## 2022-03-28 ENCOUNTER — Ambulatory Visit: Payer: Medicare Other | Admitting: Orthopaedic Surgery

## 2022-03-28 ENCOUNTER — Telehealth: Payer: Self-pay | Admitting: Physical Medicine and Rehabilitation

## 2022-03-28 NOTE — Telephone Encounter (Signed)
We cannot refill narcotics, which is what we told her last time she asked.  Happy to send in tylenol 3 or tramadol.

## 2022-03-28 NOTE — Telephone Encounter (Signed)
Pt called and states that she has some concerns with picking up her medication.Can we please call her?   Cb [phone]

## 2022-03-28 NOTE — Telephone Encounter (Signed)
Pt called requesting a call. Pt did not state what she needed. Please call pt at [phone].

## 2022-03-29 ENCOUNTER — Other Ambulatory Visit: Payer: Self-pay | Admitting: Physician Assistant

## 2022-03-29 ENCOUNTER — Telehealth: Payer: Self-pay | Admitting: Physician Assistant

## 2022-03-29 ENCOUNTER — Telehealth: Payer: Self-pay | Admitting: Physical Medicine and Rehabilitation

## 2022-03-29 NOTE — Telephone Encounter (Signed)
Unfortunately, we cannot write any narcotics at this time.  We can refer her to pain mgmt or she can see if her pcp will write them

## 2022-03-29 NOTE — Telephone Encounter (Signed)
Relayed message to patient. She states understanding and is seeing her PCP next week.

## 2022-03-29 NOTE — Telephone Encounter (Signed)
Pt called requesting a call from [facility]. Please call pt at [phone]

## 2022-03-29 NOTE — Telephone Encounter (Signed)
Called and spoke with a gentleman. He states that she just left the house ago. I ask him to tell her that I was returning her phone call and she can call me back at her convenience. He stated understanding.

## 2022-03-29 NOTE — Telephone Encounter (Signed)
Pt returned call to Lauren G. Please call pt at [phone]

## 2022-04-03 ENCOUNTER — Encounter: Payer: Medicare Other | Admitting: Student

## 2022-04-03 ENCOUNTER — Ambulatory Visit: Payer: Medicare Other

## 2022-04-05 ENCOUNTER — Encounter: Payer: Self-pay | Admitting: Student

## 2022-04-05 DIAGNOSIS — Z1152 Encounter for screening for COVID-19: Secondary | ICD-10-CM | POA: Diagnosis not present

## 2022-04-12 ENCOUNTER — Other Ambulatory Visit: Payer: Self-pay | Admitting: Physician Assistant

## 2022-04-12 DIAGNOSIS — Z1152 Encounter for screening for COVID-19: Secondary | ICD-10-CM | POA: Diagnosis not present

## 2022-04-12 NOTE — Progress Notes (Deleted)
CC: headache, concern for STI and DMII follow up.   HPI:  [name] is a 56 y.o. with medical history of HTN, HLD, DMII, MDD presenting to [facility] for complaint of a headache, concern for STI and DMII follow up.   Please see problem-based list for further details, assessments, and plans.  Past Medical History:  Diagnosis Date   Anxiety    Arthritis    Asthma    Autoimmune hepatitis ([place])    Bipolar 1 disorder ([place])    Breast lump    Depression    Diabetes mellitus    FH: chemotherapy    Gout    High cholesterol    Hypertension    Liver cirrhosis (HCC)    Lung nodules    Neuropathy    Panic disorder    Vaginal discharge 01/25/2017   Vertigo     Current Outpatient Medications (Endocrine & Metabolic):    HUMALOG 100 UNIT/ML injection, Inject 0.03 mLs (3 Units total) into the skin 3 (three) times daily with meals.   LEVEMIR 100 UNIT/ML injection, Inject 0.15 mLs (15 Units total) into the skin at bedtime.   predniSONE (DELTASONE) 20 MG tablet, Take 40 mg by mouth daily with breakfast.   SOLIQUA 100-33 UNT-MCG/ML SOPN, Inject 15 Units into the skin daily.  Current Outpatient Medications (Cardiovascular):    lisinopril (ZESTRIL) 10 MG tablet, Take 1 tablet (10 mg total) by mouth daily.  Current Outpatient Medications (Respiratory):    albuterol (VENTOLIN HFA) 108 (90 Base) MCG/ACT inhaler, INHALE ONE TO TWO puffs into THE lungs EVERY SIX HOURS AS NEEDED SHORTNESS OF BREATH   fluticasone (FLONASE) 50 MCG/ACT nasal spray, Place 2 sprays into both nostrils daily as needed for allergies or rhinitis.   Current Outpatient Medications (Analgesics):    celecoxib (CELEBREX) 200 MG capsule, Take 1 capsule (200 mg total) by mouth 2 (two) times daily.   oxyCODONE (ROXICODONE) 5 MG immediate release tablet, Take 0.5-1 pill bid prn pain   traMADol (ULTRAM) 50 MG tablet, Take 1 tablet (50 mg total) by mouth every 6 (six) hours as needed.   Current Outpatient Medications (Other):     Accu-Chek Softclix Lancets lancets, Use as instructed to check blood sugar 3x a day   bacitracin ointment, Apply 1 application topically 2 (two) times daily.   Blood Glucose Monitoring Suppl (ACCU-CHEK GUIDE ME) w/Device KIT, Use 3 times per day to check your blood sugar.   Capsaicin 0.025 % GEL, Apply 4 application topically 3 (three) times daily.   cyclobenzaprine (FLEXERIL) 10 MG tablet, Take 1 tablet (10 mg total) by mouth 2 (two) times daily as needed for muscle spasms.   famotidine (PEPCID) 20 MG tablet, Take 1 tablet (20 mg total) by mouth 2 (two) times daily.   glucose blood (ACCU-CHEK GUIDE) test strip, Use as instructed to check blood sugar 3 times a day   hydrOXYzine (ATARAX/VISTARIL) 25 MG tablet, Take 1 tablet (25 mg total) by mouth every 6 (six) hours.   mercaptopurine (PURINETHOL) 50 MG tablet, Take 50 mg by mouth daily. Give on an empty stomach 1 hour before or 2 hours after meals. Caution: Chemotherapy.   metroNIDAZOLE (FLAGYL) 500 MG tablet, Take 1 tablet (500 mg total) by mouth 2 (two) times daily.   omeprazole (PRILOSEC) 40 MG capsule, Take 40 mg by mouth daily.   ondansetron (ZOFRAN ODT) 4 MG disintegrating tablet, Take 1 tablet (4 mg total) by mouth every 8 (eight) hours as needed for nausea or  vomiting.   triamcinolone cream (KENALOG) 0.1 %, Apply 1 application topically 2 (two) times daily.   venlafaxine XR (EFFEXOR-XR) 37.5 MG 24 hr capsule, TAKE ONE CAPSULE BY MOUTH EVERY DAY  Review of Systems:  Review of system negative unless stated in the problem list or HPI.    Physical Exam:  There were no vitals filed for this visit.  Physical Exam General: NAD HENT: NCAT Lungs: CTAB, no wheeze, rhonchi or rales.  Cardiovascular: Normal heart sounds, no r/m/g, 2+ pulses in all extremities. No LE edema Abdomen: No TTP, normal bowel sounds MSK: No asymmetry or muscle atrophy.  Skin: no lesions noted on exposed skin Neuro: Alert and oriented x4. CN grossly intact Psych:  Normal mood and normal affect   Assessment & Plan:   No problem-specific Assessment & Plan notes found for this encounter.   See Encounters Tab for problem based charting.  Patient discussed with Dr. {NAMES:[id number]::"Guilloud","Hoffman","Mullen","Narendra","Vincent","Machen","Lau","Hatcher"} Hannah Schuller, MD Tillie Rung. [facility] Internal Medicine Residency, PGY-2

## 2022-04-13 ENCOUNTER — Encounter: Payer: Self-pay | Admitting: Student

## 2022-04-13 ENCOUNTER — Ambulatory Visit: Payer: Medicare Other

## 2022-04-13 ENCOUNTER — Other Ambulatory Visit: Payer: Self-pay | Admitting: Student

## 2022-04-13 ENCOUNTER — Encounter: Payer: Medicare Other | Admitting: Internal Medicine

## 2022-04-14 ENCOUNTER — Other Ambulatory Visit: Payer: Self-pay | Admitting: Physician Assistant

## 2022-04-19 DIAGNOSIS — Z1152 Encounter for screening for COVID-19: Secondary | ICD-10-CM | POA: Diagnosis not present

## 2022-04-24 ENCOUNTER — Other Ambulatory Visit: Payer: Self-pay | Admitting: Internal Medicine

## 2022-04-24 DIAGNOSIS — F32A Depression, unspecified: Secondary | ICD-10-CM

## 2022-04-25 NOTE — Telephone Encounter (Signed)
Please have patient follow-up for depression

## 2022-05-06 DIAGNOSIS — Z1152 Encounter for screening for COVID-19: Secondary | ICD-10-CM | POA: Diagnosis not present

## 2022-05-10 DIAGNOSIS — Z1152 Encounter for screening for COVID-19: Secondary | ICD-10-CM | POA: Diagnosis not present

## 2022-05-12 ENCOUNTER — Encounter: Payer: Medicare Other | Admitting: Family Medicine

## 2022-05-18 DIAGNOSIS — Z1152 Encounter for screening for COVID-19: Secondary | ICD-10-CM | POA: Diagnosis not present

## 2022-05-24 ENCOUNTER — Ambulatory Visit (INDEPENDENT_AMBULATORY_CARE_PROVIDER_SITE_OTHER): Payer: Medicare Other | Admitting: Orthopaedic Surgery

## 2022-05-24 DIAGNOSIS — M1612 Unilateral primary osteoarthritis, left hip: Secondary | ICD-10-CM
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
## 2022-05-25 ENCOUNTER — Encounter: Payer: Self-pay | Admitting: Surgery

## 2022-05-25 ENCOUNTER — Ambulatory Visit (INDEPENDENT_AMBULATORY_CARE_PROVIDER_SITE_OTHER): Payer: Medicare Other | Admitting: Surgery

## 2022-05-25 ENCOUNTER — Ambulatory Visit: Payer: Self-pay

## 2022-05-25 VITALS — BP 137/79 | HR 86 | Ht 61.0 in | Wt 180.0 lb

## 2022-05-25 DIAGNOSIS — M533 Sacrococcygeal disorders, not elsewhere classified: Secondary | ICD-10-CM | POA: Diagnosis not present

## 2022-05-25 DIAGNOSIS — M545 Low back pain, unspecified: Secondary | ICD-10-CM

## 2022-05-25 DIAGNOSIS — M2550 Pain in unspecified joint: Secondary | ICD-10-CM

## 2022-05-25 DIAGNOSIS — G8929 Other chronic pain: Secondary | ICD-10-CM

## 2022-05-25 DIAGNOSIS — Z1152 Encounter for screening for COVID-19: Secondary | ICD-10-CM | POA: Diagnosis not present

## 2022-05-25 DIAGNOSIS — M4722 Other spondylosis with radiculopathy, cervical region: Secondary | ICD-10-CM

## 2022-05-25 DIAGNOSIS — M542 Cervicalgia: Secondary | ICD-10-CM
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
## 2022-05-26 ENCOUNTER — Telehealth: Payer: Self-pay | Admitting: Orthopaedic Surgery

## 2022-05-26 NOTE — Telephone Encounter (Signed)
Yes that's fine 

## 2022-05-26 NOTE — Telephone Encounter (Signed)
Pt called requesting a letter stating she is in need of 2 knee replacements and hip replacement. Pt states she also need to letter to say she is unable to have surgeries due to A1_c. Pt asking that this is needed by Monday. Please call pt bout this matter at [phone].

## 2022-05-27 ENCOUNTER — Encounter (HOSPITAL_COMMUNITY): Payer: Self-pay

## 2022-05-27 ENCOUNTER — Other Ambulatory Visit: Payer: Self-pay

## 2022-05-27 ENCOUNTER — Emergency Department (HOSPITAL_COMMUNITY): Payer: Medicare Other

## 2022-05-27 ENCOUNTER — Emergency Department (HOSPITAL_COMMUNITY)
Admission: EM | Admit: 2022-05-27 | Discharge: 2022-05-27 | Disposition: A | Discharge status: Home / Self Care | Payer: Medicare Other | Attending: Emergency Medicine | Admitting: Emergency Medicine

## 2022-05-27 DIAGNOSIS — R519 Headache, unspecified: Secondary | ICD-10-CM | POA: Diagnosis not present

## 2022-05-27 DIAGNOSIS — Z794 Long term (current) use of insulin: Secondary | ICD-10-CM | POA: Insufficient documentation

## 2022-05-27 DIAGNOSIS — R739 Hyperglycemia, unspecified: Secondary | ICD-10-CM

## 2022-05-27 DIAGNOSIS — R079 Chest pain, unspecified: Secondary | ICD-10-CM

## 2022-05-27 DIAGNOSIS — E1165 Type 2 diabetes mellitus with hyperglycemia: Secondary | ICD-10-CM | POA: Diagnosis not present

## 2022-05-27 DIAGNOSIS — R0789 Other chest pain: Secondary | ICD-10-CM | POA: Diagnosis not present

## 2022-05-27 DIAGNOSIS — I10 Essential (primary) hypertension: Secondary | ICD-10-CM | POA: Diagnosis not present

## 2022-05-27 DIAGNOSIS — Z79899 Other long term (current) drug therapy: Secondary | ICD-10-CM | POA: Insufficient documentation

## 2022-05-27 LAB — URINALYSIS, ROUTINE W REFLEX MICROSCOPIC
Bacteria, UA: NONE SEEN
Bilirubin Urine: NEGATIVE
Glucose, UA: >=500 mg/dL — AB
Hgb urine dipstick: NEGATIVE
Ketones, ur: 5 mg/dL — AB
Leukocytes,Ua: NEGATIVE
Nitrite: NEGATIVE
Protein, ur: NEGATIVE mg/dL
Specific Gravity, Urine: 1.028 (ref 1.005–1.030)
pH: 7 (ref 5.0–8.0)

## 2022-05-27 LAB — BLOOD GAS, VENOUS
Acid-Base Excess: 6.8 mmol/L — ABNORMAL HIGH (ref 0.0–2.0)
Bicarbonate: 32 mmol/L — ABNORMAL HIGH (ref 20.0–28.0)
O2 Saturation: 64.4 %
Patient temperature: 37
pCO2, Ven: 46 mmHg (ref 44–60)
pH, Ven: 7.45 — ABNORMAL HIGH (ref 7.25–7.43)
pO2, Ven: 39 mmHg (ref 32–45)

## 2022-05-27 LAB — PHOSPHORUS: Phosphorus: 5.2 mg/dL — ABNORMAL HIGH (ref 2.5–4.6)

## 2022-05-27 LAB — CBC
HCT: 40.9 % (ref 35.0–45.0)
HCT: 44.4 % (ref 36.0–46.0)
Hemoglobin: 13.9 g/dL (ref 11.7–15.5)
Hemoglobin: 15.2 g/dL — ABNORMAL HIGH (ref 12.0–15.0)
MCH: 34.2 pg — ABNORMAL HIGH (ref 27.0–33.0)
MCH: 33.8 pg (ref 26.0–34.0)
MCHC: 34 g/dL (ref 32.0–36.0)
MCHC: 34.2 g/dL (ref 30.0–36.0)
MCV: 100.7 fL — ABNORMAL HIGH (ref 80.0–100.0)
MCV: 98.7 fL (ref 80.0–100.0)
MPV: 10.1 fL (ref 7.5–12.5)
Platelets: 401 10*3/uL — ABNORMAL HIGH (ref 140–400)
Platelets: 396 10*3/uL (ref 150–400)
RBC: 4.06 10*6/uL (ref 3.80–5.10)
RBC: 4.5 MIL/uL (ref 3.87–5.11)
RDW: 12.2 % (ref 11.0–15.0)
RDW: 13.2 % (ref 11.5–15.5)
WBC: 14.9 10*3/uL — ABNORMAL HIGH (ref 3.8–10.8)
WBC: 12.5 10*3/uL — ABNORMAL HIGH (ref 4.0–10.5)
nRBC: 0 % (ref 0.0–0.2)

## 2022-05-27 LAB — RHEUMATOID FACTOR: Rheumatoid fact SerPl-aCnc: <14 IU/mL (ref <14)

## 2022-05-27 LAB — ANTI-NUCLEAR AB-TITER (ANA TITER): ANA Titer 1: 1:40 {titer} — ABNORMAL HIGH

## 2022-05-27 LAB — BASIC METABOLIC PANEL
Anion gap: 16 — ABNORMAL HIGH (ref 5–15)
BUN: 15 mg/dL (ref 6–20)
CO2: 19 mmol/L — ABNORMAL LOW (ref 22–32)
Calcium: 9.4 mg/dL (ref 8.9–10.3)
Chloride: 99 mmol/L (ref 98–111)
Creatinine, Ser: 0.6 mg/dL (ref 0.44–1.00)
GFR, Estimated: >60 mL/min (ref >60)
Glucose, Bld: 389 mg/dL — ABNORMAL HIGH (ref 70–99)
Potassium: 4.7 mmol/L (ref 3.5–5.1)
Sodium: 134 mmol/L — ABNORMAL LOW (ref 135–145)

## 2022-05-27 LAB — CBG MONITORING, ED
Glucose-Capillary: >600 mg/dL (ref 70–99)
Glucose-Capillary: 108 mg/dL — ABNORMAL HIGH (ref 70–99)
Glucose-Capillary: 80 mg/dL (ref 70–99)
Glucose-Capillary: 269 mg/dL — ABNORMAL HIGH (ref 70–99)

## 2022-05-27 LAB — HEPATIC FUNCTION PANEL
ALT: 101 U/L — ABNORMAL HIGH (ref 0–44)
AST: 74 U/L — ABNORMAL HIGH (ref 15–41)
Albumin: 3.9 g/dL (ref 3.5–5.0)
Alkaline Phosphatase: 120 U/L (ref 38–126)
Bilirubin, Direct: 0.4 mg/dL — ABNORMAL HIGH (ref 0.0–0.2)
Indirect Bilirubin: 0.7 mg/dL (ref 0.3–0.9)
Total Bilirubin: 1.1 mg/dL (ref 0.3–1.2)
Total Protein: 8.1 g/dL (ref 6.5–8.1)

## 2022-05-27 LAB — SEDIMENTATION RATE: Sed Rate: 36 mm/h — ABNORMAL HIGH (ref 0–30)

## 2022-05-27 LAB — ANA: Anti Nuclear Antibody (ANA): POSITIVE — AB

## 2022-05-27 LAB — C-REACTIVE PROTEIN: CRP: 17.1 mg/L — ABNORMAL HIGH (ref <8.0)

## 2022-05-27 LAB — PROTIME-INR
INR: 1 (ref 0.8–1.2)
Prothrombin Time: 12.9 seconds (ref 11.4–15.2)

## 2022-05-27 LAB — I-STAT BETA HCG BLOOD, ED (MC, WL, AP ONLY): I-stat hCG, quantitative: <5 m[IU]/mL (ref <5)

## 2022-05-27 LAB — URIC ACID: Uric Acid, Serum: 4.5 mg/dL (ref 2.5–7.0)

## 2022-05-27 LAB — BETA-HYDROXYBUTYRIC ACID
Beta-Hydroxybutyric Acid: 0.93 mmol/L — ABNORMAL HIGH (ref 0.05–0.27)
Beta-Hydroxybutyric Acid: 0.38 mmol/L — ABNORMAL HIGH (ref 0.05–0.27)

## 2022-05-27 LAB — TROPONIN I (HIGH SENSITIVITY)
Troponin I (High Sensitivity): 2 ng/L (ref <18)
Troponin I (High Sensitivity): 2 ng/L (ref <18)

## 2022-05-27 LAB — MAGNESIUM: Magnesium: 2.2 mg/dL (ref 1.7–2.4)

## 2022-05-27 MED ORDER — INSULIN REGULAR(HUMAN) IN NACL 100-0.9 UT/100ML-% IV SOLN
INTRAVENOUS | Status: DC
  Administered 2022-05-27: 17 [IU]/h via INTRAVENOUS
  Filled 2022-05-27: qty 100

## 2022-05-27 MED ORDER — DEXTROSE IN LACTATED RINGERS 5 % IV SOLN: INTRAVENOUS | Status: DC

## 2022-05-27 MED ORDER — ONDANSETRON HCL 4 MG/2ML IJ SOLN
4.0000 mg | Freq: Once | INTRAMUSCULAR | Status: AC
  Administered 2022-05-27: 4 mg via INTRAVENOUS
  Filled 2022-05-27: qty 2

## 2022-05-27 MED ORDER — ONDANSETRON 4 MG PO TBDP: 4.0000 mg | ORAL_TABLET | Freq: Once | ORAL | Status: DC

## 2022-05-27 MED ORDER — LACTATED RINGERS IV BOLUS
1000.0000 mL | Freq: Once | INTRAVENOUS | Status: AC
  Administered 2022-05-27: 1000 mL via INTRAVENOUS

## 2022-05-27 MED ORDER — DEXTROSE 50 % IV SOLN: 0.0000 mL | INTRAVENOUS | Status: DC | PRN

## 2022-05-27 MED ORDER — POTASSIUM CHLORIDE 10 MEQ/100ML IV SOLN
10.0000 meq | INTRAVENOUS | Status: DC
  Administered 2022-05-27: 10 meq via INTRAVENOUS
  Filled 2022-05-27: qty 100

## 2022-05-27 MED ORDER — ALUM & MAG HYDROXIDE-SIMETH 200-200-20 MG/5ML PO SUSP
30.0000 mL | Freq: Once | ORAL | Status: AC
  Administered 2022-05-27: 30 mL via ORAL
  Filled 2022-05-27: qty 30

## 2022-05-27 MED ORDER — LIDOCAINE VISCOUS HCL 2 % MT SOLN
15.0000 mL | Freq: Once | OROMUCOSAL | Status: AC
  Administered 2022-05-27: 15 mL via ORAL
  Filled 2022-05-27: qty 15

## 2022-05-27 MED ORDER — LACTATED RINGERS IV BOLUS
500.0000 mL | Freq: Once | INTRAVENOUS | Status: AC
  Administered 2022-05-27: 500 mL via INTRAVENOUS

## 2022-05-27 NOTE — ED Provider Notes (Signed)
[facility] DEPT Provider Note   CSN: [id number] Arrival date & time: 05/27/22  1042     History {Add pertinent medical, surgical, social history, OB history to HPI:1} Chief Complaint  Patient presents with   Chest Pain    Hannah Young is a 56 y.o. female.   Chest Pain   56 year old female presents emergency department with myriad of complaints..  She reports headache that has been present for the past 1 to 2 months that has been intermittent in nature.  She reports headache pain located on the top of scalp.  Reports gradual onset with intermittent severity.  She reports similarity in headaches that she has had in the past.  She denies changes in vision, weakness/sensory deficits, gait abnormality, facial droop, slurred speech.  She also reports chest pain that has been present for the past 1 to 2 months as well described as burning in nature.  She reports worsening of pain with eating foods and drinking hot liquids.  She notes mild relief of symptoms with drinking cool/cold liquids.  Denies association with physical activity or associated shortness of breath.  Patient also notes 1-2 episodes of nosebleed after awakening at night.  Denies fever, chills, night sweats, abdominal pain, vomiting, urinary/vaginal symptoms, change in bowel habits.  Patient does note that she has had some intermittent nausea.  Denies history of DVT/PE, recent surgery/immobilization, no malignancy, clotting disorder, recent travel.  Patient reports intermittently taking her diabetes medication at home regularly but did take them yesterday.  Past medical history significant for autoimmune hepatitis of which she is on mercaptopurine, diabetes mellitus on Levemir and Humalog, cirrhosis, bipolar 1 disorder, anxiety, gout, hypertension, panic disorder, osteoarthritis  Home Medications Prior to Admission medications   Medication Sig Start Date End Date Taking? Authorizing Provider   Accu-Chek Softclix Lancets lancets Use as instructed to check blood sugar 3x a day 07/16/20   Aslam, Loralyn Freshwater, MD  albuterol (VENTOLIN HFA) 108 (90 Base) MCG/ACT inhaler INHALE ONE TO TWO puffs into THE lungs EVERY SIX HOURS AS NEEDED SHORTNESS OF BREATH 09/06/21   Rehman, Areeg N, DO  bacitracin ointment Apply 1 application topically 2 (two) times daily. 02/04/21   CardamaGrayce Sessions, MD  Blood Glucose Monitoring Suppl (ACCU-CHEK GUIDE ME) w/Device KIT Use 3 times per day to check your blood sugar. 07/16/20   Harvie Heck, MD  Capsaicin 0.025 % GEL Apply 4 application topically 3 (three) times daily. 12/01/19   Maudie Mercury, MD  celecoxib (CELEBREX) 200 MG capsule Take 1 capsule (200 mg total) by mouth 2 (two) times daily. 02/02/22   Leandrew Koyanagi, MD  cyclobenzaprine (FLEXERIL) 10 MG tablet Take 1 tablet (10 mg total) by mouth 2 (two) times daily as needed for muscle spasms. 04/03/21   Pearson Forster, NP  famotidine (PEPCID) 20 MG tablet Take 1 tablet (20 mg total) by mouth 2 (two) times daily. 03/04/21   Palumbo, April, MD  fluticasone [facility]) 50 MCG/ACT nasal spray Place 2 sprays into both nostrils daily as needed for allergies or rhinitis.     [provider]  glucose blood (ACCU-CHEK GUIDE) test strip Use as instructed to check blood sugar 3 times a day 07/16/20   Harvie Heck, MD  HUMALOG 100 UNIT/ML injection Inject 0.03 mLs (3 Units total) into the skin 3 (three) times daily with meals. 03/27/22   Lacinda Axon, MD  hydrOXYzine (ATARAX/VISTARIL) 25 MG tablet Take 1 tablet (25 mg total) by mouth every 6 (six)  hours. 04/03/21   Pearson Forster, NP  LEVEMIR 100 UNIT/ML injection Inject 0.15 mLs (15 Units total) into the skin at bedtime. 03/10/22   Masters, Katie, DO  lisinopril (ZESTRIL) 10 MG tablet Take 1 tablet (10 mg total) by mouth daily. 12/05/21   Harvie Heck, MD  mercaptopurine (PURINETHOL) 50 MG tablet Take 50 mg by mouth daily. Give on an empty stomach 1 hour before or 2  hours after meals. Caution: Chemotherapy.    [provider]  metroNIDAZOLE (FLAGYL) 500 MG tablet Take 1 tablet (500 mg total) by mouth 2 (two) times daily. 12/13/20   Aris Lot, MD  omeprazole (PRILOSEC) 40 MG capsule Take 40 mg by mouth daily. 06/28/20   [provider]  ondansetron (ZOFRAN ODT) 4 MG disintegrating tablet Take 1 tablet (4 mg total) by mouth every 8 (eight) hours as needed for nausea or vomiting. 05/28/21   Caccavale, Sophia, PA-C  oxyCODONE (ROXICODONE) 5 MG immediate release tablet Take 0.5-1 pill bid prn pain 03/10/22   Aundra Dubin, PA-C  predniSONE (DELTASONE) 20 MG tablet Take 40 mg by mouth daily with breakfast.    [provider]  SOLIQUA 100-33 UNT-MCG/ML SOPN Inject 15 Units into the skin daily. 01/30/22   Harvie Heck, MD  traMADol (ULTRAM) 50 MG tablet Take 1 tablet (50 mg total) by mouth every 6 (six) hours as needed. 02/22/22   Aundra Dubin, PA-C  triamcinolone cream (KENALOG) 0.1 % Apply 1 application topically 2 (two) times daily. 04/03/21   Pearson Forster, NP  venlafaxine XR (EFFEXOR-XR) 37.5 MG 24 hr capsule TAKE ONE CAPSULE BY MOUTH EVERY DAY 04/25/22   Masters, Joellen Jersey, DO      Allergies    Amoxicillin, Augmentin [amoxicillin-pot clavulanate], Tylenol [acetaminophen], and Latex    Review of Systems   Review of Systems  Cardiovascular:  Positive for chest pain.  All other systems reviewed and are negative.   Physical Exam Updated Vital Signs BP (!) 116/92 (BP Location: Left Arm)   Pulse 95   Temp 97.8 F (36.6 C) (Oral)   Resp 15   SpO2 99%  Physical Exam Vitals and nursing note reviewed.  Constitutional:      General: She is not in acute distress.    Appearance: She is well-developed. She is not ill-appearing, toxic-appearing or diaphoretic.     Comments: Speech is rapid and tangential.  HENT:     Head: Normocephalic and atraumatic.     Nose: Nose normal. No congestion or rhinorrhea.     Mouth/Throat:      Mouth: Mucous membranes are moist.     Pharynx: Oropharynx is clear.  Eyes:     General:        Right eye: No discharge.        Left eye: No discharge.     Extraocular Movements: Extraocular movements intact.     Conjunctiva/sclera: Conjunctivae normal.     Pupils: Pupils are equal, round, and reactive to light.  Cardiovascular:     Rate and Rhythm: Normal rate and regular rhythm.     Heart sounds: No murmur heard. Pulmonary:     Effort: Pulmonary effort is normal. No respiratory distress.     Breath sounds: Normal breath sounds. No wheezing or rales.  Abdominal:     Palpations: Abdomen is soft.     Tenderness: There is no right CVA tenderness, left CVA tenderness or guarding.     Comments: Mild epigastric tenderness to palpation.  Musculoskeletal:  General: No swelling.     Cervical back: Neck supple. No rigidity.     Right lower leg: No edema.     Left lower leg: No edema.  Skin:    General: Skin is warm and dry.     Capillary Refill: Capillary refill takes less than 2 seconds.  Neurological:     Mental Status: She is alert.     Comments: Alert and oriented to self, place, time and event.   Speech is fluent, clear without dysarthria or dysphasia.   Strength 5/5 in upper/lower extremities   Sensation intact in upper/lower extremities   Normal gait.  Negative Romberg. No pronator drift.  Normal finger-to-nose and feet tapping.  CN I not tested  CN II grossly intact visual fields bilaterally. Did not visualize posterior eye.  CN III, IV, VI PERRLA and EOMs intact bilaterally  CN V Intact sensation to sharp and light touch to the face  CN VII facial movements symmetric  CN VIII not tested  CN IX, X no uvula deviation, symmetric rise of soft palate  CN XI 5/5 SCM and trapezius strength bilaterally  CN XII Midline tongue protrusion, symmetric L/R movements     Psychiatric:        Mood and Affect: Mood normal.    ED Results / Procedures / Treatments    Labs (all labs ordered are listed, but only abnormal results are displayed) Labs Reviewed  CBC - Abnormal; Notable for the following components:      Result Value   WBC 12.5 (*)    Hemoglobin 15.2 (*)    All other components within normal limits  CBG MONITORING, ED - Abnormal; Notable for the following components:   Glucose-Capillary >600 (*)    All other components within normal limits  URINALYSIS, ROUTINE W REFLEX MICROSCOPIC  BETA-HYDROXYBUTYRIC ACID  BETA-HYDROXYBUTYRIC ACID  BLOOD GAS, VENOUS  COMPREHENSIVE METABOLIC PANEL  I-STAT BETA HCG BLOOD, ED ([facility], [facility], [facility] ONLY)  TROPONIN I (HIGH SENSITIVITY)  TROPONIN I (HIGH SENSITIVITY)    EKG EKG Interpretation  Date/Time:  Saturday May 27 2022 10:56:57 EDT Ventricular Rate:  72 PR Interval:  119 QRS Duration: 89 QT Interval:  355 QTC Calculation: 389 R Axis:   45 Text Interpretation: Sinus rhythm Borderline short PR interval Consider left ventricular hypertrophy No significant change was found Confirmed by Ezequiel Essex [phone]) on 05/27/2022 2:41:38 PM  Radiology CT Head Wo Contrast  Result Date: 05/27/2022 CLINICAL DATA:  56 year old female with history of dizziness. Headache. EXAM: CT HEAD WITHOUT CONTRAST TECHNIQUE: Contiguous axial images were obtained from the base of the skull through the vertex without intravenous contrast. RADIATION DOSE REDUCTION: This exam was performed according to the departmental dose-optimization program which includes automated exposure control, adjustment of the mA and/or kV according to patient size and/or use of iterative reconstruction technique. COMPARISON:  Head CT 03/03/2021. FINDINGS: Brain: Mild cerebral atrophy. Patchy areas of mild decreased attenuation are noted throughout the deep and periventricular white matter of the cerebral hemispheres bilaterally, compatible with mild chronic microvascular ischemic disease. No evidence of acute infarction, hemorrhage, hydrocephalus,  extra-axial collection or mass lesion/mass effect. Vascular: No hyperdense vessel or unexpected calcification. Skull: Normal. Negative for fracture or focal lesion. Sinuses/Orbits: No acute finding. Other: None. IMPRESSION: 1. No acute intracranial abnormalities. 2. Mild cerebral atrophy with very mild chronic microvascular ischemic changes in the cerebral white matter, as above. Electronically Signed   By: Vinnie Langton M.D.   On: 05/27/2022 11:54   DG  Chest 2 View  Result Date: 05/27/2022 CLINICAL DATA:  Chest pain. EXAM: CHEST - 2 VIEW COMPARISON:  05/28/2021. FINDINGS: Cardiac silhouette is normal in size and configuration. Normal mediastinal and hilar contours. Clear lungs.  No pleural effusion or pneumothorax. Skeletal structures are intact. IMPRESSION: No active cardiopulmonary disease. Electronically Signed   By: Lajean Manes M.D.   On: 05/27/2022 11:25    Procedures Procedures  {Document cardiac monitor, telemetry assessment procedure when appropriate:1}  Medications Ordered in ED Medications  lactated ringers bolus 1,000 mL (has no administration in time range)  ondansetron (ZOFRAN) injection 4 mg (has no administration in time range)    ED Course/ Medical Decision Making/ A&P Clinical Course as of 05/27/22 2214  Sat May 27, 2022  2048 Consult hospital medicine Dr. Posey Pronto regarding the patient.  Suggested redrawing CBG as well as BMP as laboratory studies more indicative of hyperglycemia instead of DKA/HHS. [CR]    Clinical Course User Index [CR] Wilnette Kales, PA                           Medical Decision Making Amount and/or Complexity of Data Reviewed Labs: ordered. Radiology: ordered.  Risk OTC drugs. Prescription drug management. Decision regarding hospitalization.   This patient presents to the ED for concern of multiple complaints, this involves an extensive number of treatment options, and is a complaint that carries with it a high risk of complications  and morbidity.  The differential diagnosis includes bipolar 1, ACS, PE, gastritis, CVA, cerebral venous thrombosis, migraine/tension/cluster headache, HHS/DKA, hyperglycemia   Co morbidities that complicate the patient evaluation  See HPI   Additional history obtained:  Additional history obtained from EMR External records from outside source obtained and reviewed including hospital records   Lab Tests:  I Ordered, and personally interpreted labs.  The pertinent results include: Mild leukocytosis of 12.5, no evidence of anemia.  No evidence of platelet abnormalities.  BMP significant for mild hyponatremia with a sodium of 134 which supplemented via IV fluids.  Mild decrease in bicarb of 19, mild anion gap of 16 which was corrected with insulin and fluids.  UA significant for 5 ketones greater than 500 glucose.  Initial beta hydroxybutyric acid of 0.93 which decreased 2.38 with insulin correction.  PT/INR within normal range.  Initial troponin less than 2 with repeat less than 2; no new EKG findings indicative of ischemia so doubt ACS.  Elevation of liver enzymes of AST of 74, ALT of 101 which slightly decreased from patient's baseline secondary to her autoimmune hepatitis.  VBG significant for mild alkalosis with a pH of 7.4, increased bicarb 32.  Beta-hCG negative.  Initial CBG of greater than 600 but decreased with administration of fluids and insulin periodically. ***   Imaging Studies ordered:  I ordered imaging studies including CT head, chest x-ray I independently visualized and interpreted imaging which showed  CT head: No acute abnormalities Chest x-ray: No active cardiopulmonary disease I agree with the radiologist interpretation   Cardiac Monitoring: / EKG:  The patient was maintained on a cardiac monitor.  I personally viewed and interpreted the cardiac monitored which showed an underlying rhythm of: Sinus rhythm without new findings indicative of ischemia   Consultations  Obtained:  Attending physician Dr. Doren Custard was consulted regarding the patient he agreed with treatment plan and disposition.  See ED course  Problem List / ED Course / Critical interventions / Medication management  Hyperglycemia I ordered  medication including lactated Ringer's as well as insulin for correction of patient's hyperglycemia with mild evidence of beginnings of HHS.  Zofran for nausea. Reevaluation of the patient after these medicines showed that the patient improved I have reviewed the patients home medicines and have made adjustments as needed   Social Determinants of Health:  Medical noncompliance.  Denies tobacco, illicit drug use.   Test / Admission - Considered:  Hyperglycemia Vitals signs significant for hypertension with blood pressure 140/97.  Recommend close follow-up with PCP regarding elevation of blood pressure.. Otherwise within normal range and stable throughout visit. Laboratory/imaging studies significant for: See above Patient has a myriad of complaints that are all chronic in nature.  Some could be secondary to patient's hyperglycemia which was treated with improvement of symptoms while in emergency department.  Doubt ACS.  Doubt PE.  Doubt dissection.  Patient's chest pain seems more likely gastritis versus PUD given association with food as well as lack of shortness of breath.  It does not sound pleuritic in nature.  Headache seems similar nature to the headaches in the past with no acute neurologic changes.  Patient also noted significant improvement of headache while emergency department.  Patient had borderline concern for HHS and initially expressed desire for admission to the hospital for correction.  Hospital medicine was consulted regarding the patient and desired for correction while in the ED and reassessment of symptoms.  Upon reassessment, patient noted significant improvement and desire to be discharged.  Insulin was discontinued and patient was  monitored for concern of possible hypoglycemia given amount of insulin administered without monitoring CBGs by nursing staff along eye.     {Document critical care time when appropriate:1} {Document review of labs and clinical decision tools ie heart score, Chads2Vasc2 etc:1}  {Document your independent review of radiology images, and any outside records:1} {Document your discussion with family members, caretakers, and with consultants:1} {Document social determinants of health affecting pt's care:1} {Document your decision making why or why not admission, treatments were needed:1} Final Clinical Impression(s) / ED Diagnoses Final diagnoses:  None    Rx / DC Orders ED Discharge Orders     None

## 2022-05-27 NOTE — Discharge Instructions (Signed)
Note the work-up today was overall reassuring.  Continue at home medications as prescribed.  Keep your upcoming appointment with your PCP.  Please not hesitate to return to emergency department the worrisome signs symptoms we discussed, parent.

## 2022-05-27 NOTE — ED Triage Notes (Signed)
Patient BIB [facility] from home. Having anterior wall chest pain for a month in the middle of her chest. Along with a burning sensation on the top of her head. No shortness of breath.

## 2022-05-27 NOTE — ED Provider Triage Note (Signed)
Emergency Medicine Provider Triage Evaluation Note  Hannah Young , a 56 y.o. female  was evaluated in triage.  Pt complains of dizziness, burning eyes, nosebleeds, hair falling out and soft spot in her scalp/. + headaches. She is concerned about mold in her house. Also c/o intermittent chest pressure. Feels sob- worse when she tries to relax. Has a hx of cirrhosis.   Review of Systems  Positive: cp Negative: fever  Physical Exam  BP (!) 116/92 (BP Location: Left Arm)   Pulse 95   Temp 97.8 F (36.6 C) (Oral)   Resp 15   SpO2 99%  Gen:   Awake, no distress   Resp:  Normal effort  MSK:   Moves extremities without difficulty  Other:  Area of alopecia on the top of the head Psych:   Speech is rapid  Medical Decision Making  Medically screening exam initiated at 11:15 AM.  Appropriate orders placed.  Hannah Young was informed that the remainder of the evaluation will be completed by another provider, this initial triage assessment does not replace that evaluation, and the importance of remaining in the ED until their evaluation is complete.  Work up initiated   [employer], PA-C 05/27/22 1120

## 2022-05-28 LAB — OSMOLALITY: Osmolality: 299 mOsm/kg — ABNORMAL HIGH (ref 275–295)

## 2022-05-29 NOTE — Telephone Encounter (Signed)
Spoke with patient. Left letter up front for pick up. Her sister may come and get letter if patient is unable to come.

## 2022-05-30 ENCOUNTER — Telehealth: Payer: Self-pay

## 2022-05-30 ENCOUNTER — Encounter: Payer: Medicare Other | Admitting: Internal Medicine

## 2022-05-30 NOTE — Patient Outreach (Signed)
  Care Coordination TOC Note Transition Care Management Follow-up Telephone Call Date of discharge and from where: [facility] ED 05/27/22 How have you been since you were released from the hospital? "I am not doing too well, I have to go to court today and I was late for my appointment this morning"  Any questions or concerns? No  Items Reviewed: Did the pt receive and understand the discharge instructions provided? Yes  Medications obtained and verified? Yes  Other? No  Any new allergies since your discharge? No  Dietary orders reviewed? No Do you have support at home? Yes   Home Care and Equipment/Supplies: Were home health services ordered? no If so, what is the name of the agency? N/a  Has the agency set up a time to come to the patient's home? not applicable Were any new equipment or medical supplies ordered?  No What is the name of the medical supply agency? N/A Were you able to get the supplies/equipment? not applicable Do you have any questions related to the use of the equipment or supplies? No  Functional Questionnaire: (I = Independent and D = Dependent) ADLs: I  Bathing/Dressing- I  Meal Prep- I  Eating- I  Maintaining continence- I  Transferring/Ambulation- I  Managing Meds- I  Follow up appointments reviewed:  PCP Hospital f/u appt confirmed? Yes  Scheduled to see Dr. Allyson Sabal on 06/08/22 @ 0945. [facility] f/u appt confirmed? No   Are transportation arrangements needed? No  If their condition worsens, is the pt aware to call PCP or go to the Emergency Dept.? Yes Was the patient provided with contact information for the PCP's office or ED? Yes Was to pt encouraged to call back with questions or concerns? Yes  SDOH assessments and interventions completed:   Yes  Care Coordination Interventions Activated:  Yes   Care Coordination Interventions:  No Care Coordination interventions needed at this time.   Encounter Outcome:  Pt. Visit Completed

## 2022-05-30 NOTE — Progress Notes (Deleted)
HTN  ASTHMA LUNG NODULES  AI HEPATITIS  DM  OCCIPITAL NEURALGIA  OA ARTHRALGIA  DEPRESSION  HA  Albuterol Celebrex 200 BID Cyclobenzaprine 10 BID PRN Famotidine Humalog 3 units TID w meals Hydroxyzine 25 q6h Levemir 15 units Lisino 10 Mercaptopurine 50 Oxy 2.5-5 BID PRN Prednisone 40 Soliqua 15 Tramadol 50 q6h prn Venlafaxine 37.5   ZOSTER COLON URINE PRO EYE EXAM FOOT EXAM A1C FLU

## 2022-05-31 ENCOUNTER — Ambulatory Visit: Payer: Medicare Other | Admitting: Sports Medicine

## 2022-06-01 ENCOUNTER — Ambulatory Visit (INDEPENDENT_AMBULATORY_CARE_PROVIDER_SITE_OTHER): Payer: Medicare Other | Admitting: Sports Medicine

## 2022-06-01 ENCOUNTER — Encounter: Payer: Self-pay | Admitting: Sports Medicine

## 2022-06-01 ENCOUNTER — Ambulatory Visit: Payer: Self-pay

## 2022-06-01 DIAGNOSIS — M25552 Pain in left hip: Secondary | ICD-10-CM

## 2022-06-01 DIAGNOSIS — Z1152 Encounter for screening for COVID-19: Secondary | ICD-10-CM | POA: Diagnosis not present

## 2022-06-01 MED ORDER — METHYLPREDNISOLONE ACETATE 40 MG/ML IJ SUSP
40.0000 mg | INTRAMUSCULAR | Status: AC | PRN
  Administered 2022-06-01: 40 mg via INTRA_ARTICULAR

## 2022-06-01 MED ORDER — LIDOCAINE HCL 1 % IJ SOLN
4.0000 mL | INTRAMUSCULAR | Status: AC | PRN
  Administered 2022-06-01: 4 mL

## 2022-06-01 NOTE — Progress Notes (Signed)
   Procedure Note  Patient: Hannah Young             Date of Birth: 1965-12-05           MRN: [id number]             Visit Date: 06/01/2022  Procedures: Visit Diagnoses:  1. Pain in left hip    Large Joint Inj: L hip joint on 06/01/2022 9:59 AM Indications: pain Details: 22 G 3.5 in needle, ultrasound-guided anterior approach Medications: 4 mL lidocaine 1 %; 40 mg methylPREDNISolone acetate 40 MG/ML Outcome: tolerated well, no immediate complications  Procedure: US-guided intra-articular hip injection, left After discussion on risks/benefits/indications and informed verbal consent was obtained, a timeout was performed. Patient was lying supine on exam table. The hip was cleaned with betadine and alcohol swabs. Then utilizing ultrasound guidance, the patient's femoral head and neck junction was identified and subsequently injected with 4:1 lidocaine:depomedrol via an in-plane approach with ultrasound visualization of the injectate administered into the hip joint. Patient tolerated procedure well without immediate complications.  Procedure, treatment alternatives, risks and benefits explained, specific risks discussed. Consent was given by the patient. Immediately prior to procedure a time out was called to verify the correct patient, procedure, equipment, support staff and site/side marked as required. Patient was prepped and draped in the usual sterile fashion.     - I evaluated the patient about 10 minutes post-injection and she had improvement in pain and range of motion - follow-up with Dr. Erlinda Hong as indicated; I am happy to see them as needed - I did discuss transient elevation in bG's --> she is to eat healthy and monitor sugars over the next week   Elba Barman, DO [place]  This note was dictated using Dragon naturally speaking software and may contain errors in syntax, spelling, or content which have not been  identified prior to signing this note.

## 2022-06-07 ENCOUNTER — Telehealth: Payer: Self-pay

## 2022-06-07 DIAGNOSIS — K754 Autoimmune hepatitis: Secondary | ICD-10-CM | POA: Diagnosis not present

## 2022-06-07 DIAGNOSIS — Z1152 Encounter for screening for COVID-19: Secondary | ICD-10-CM | POA: Diagnosis not present

## 2022-06-07 NOTE — Telephone Encounter (Signed)
   Patient visit on 05/27/2022 at [facility] was for Chest Pain  Have you been able to follow up with your primary care physician? - Yes  The patient was or was not able to obtain any needed medicine or equipment. - Was, Yes  Are there diet recommendations that you are having difficulty following? - No  Patient expresses understanding of discharge instructions and education provided has no other needs at this time.    [facility]  [place], [place]  Main Phone: [phone]  E-mail: [email or web address]  Website: [email or web address]

## 2022-06-08 ENCOUNTER — Telehealth: Payer: Self-pay | Admitting: Sports Medicine

## 2022-06-08 ENCOUNTER — Encounter: Payer: Self-pay | Admitting: Student

## 2022-06-08 ENCOUNTER — Encounter: Payer: Medicare Other | Admitting: Student

## 2022-06-08 NOTE — Telephone Encounter (Signed)
Patient called advised she is still in a lot of pain after the injection. Patient said she is feeling a throbbing  and burning sensation. Patient asked if she can have surgery? Patient said she is sitting up on her couch all day because of the pain she is feeling. The number to contact patient is [phone]

## 2022-06-09 ENCOUNTER — Telehealth: Payer: Self-pay | Admitting: Sports Medicine

## 2022-06-09 ENCOUNTER — Encounter: Payer: Self-pay | Admitting: Student

## 2022-06-09 NOTE — Telephone Encounter (Signed)
Called and scheduled follow up.

## 2022-06-09 NOTE — Telephone Encounter (Signed)
Dr. Rolena Infante spoke with patient

## 2022-06-09 NOTE — Telephone Encounter (Signed)
Yes that would be great.   Thank you

## 2022-06-10 ENCOUNTER — Other Ambulatory Visit: Payer: Self-pay | Admitting: Internal Medicine

## 2022-06-13 NOTE — Telephone Encounter (Signed)
Patient needs follow-up for ED visit for hyperglycemia. Thank you Glenda for contacted patient.

## 2022-06-13 NOTE — Telephone Encounter (Signed)
Called pt to schedule an appt - stated she has to go to court and once this is resolved she will call back to schedule an appt.

## 2022-06-15 ENCOUNTER — Other Ambulatory Visit: Payer: Self-pay | Admitting: Student

## 2022-06-15 ENCOUNTER — Other Ambulatory Visit: Payer: Medicare Other

## 2022-06-15 DIAGNOSIS — Z1152 Encounter for screening for COVID-19: Secondary | ICD-10-CM | POA: Diagnosis not present

## 2022-06-16 ENCOUNTER — Ambulatory Visit: Payer: Medicare Other | Admitting: Orthopaedic Surgery

## 2022-06-16 NOTE — Telephone Encounter (Signed)
Patient needs follow-up in person for diabetes. I have refilled insulin.

## 2022-06-21 ENCOUNTER — Ambulatory Visit: Payer: Medicare Other | Admitting: Orthopedic Surgery

## 2022-06-21 DIAGNOSIS — Z1152 Encounter for screening for COVID-19: Secondary | ICD-10-CM | POA: Diagnosis not present

## 2022-06-30 ENCOUNTER — Other Ambulatory Visit: Payer: Self-pay | Admitting: Nurse Practitioner

## 2022-06-30 DIAGNOSIS — Z1152 Encounter for screening for COVID-19: Secondary | ICD-10-CM | POA: Diagnosis not present

## 2022-06-30 DIAGNOSIS — K7469 Other cirrhosis of liver: Secondary | ICD-10-CM

## 2022-06-30 DIAGNOSIS — K754 Autoimmune hepatitis: Secondary | ICD-10-CM

## 2022-07-03 ENCOUNTER — Telehealth: Payer: Self-pay

## 2022-07-03 NOTE — Telephone Encounter (Signed)
Pt is requesting a call back .Marland Kitchen About housing she stated that she spoke to you before about the problem .Marland Kitchen

## 2022-07-04 DIAGNOSIS — Z1152 Encounter for screening for COVID-19: Secondary | ICD-10-CM | POA: Diagnosis not present

## 2022-07-10 ENCOUNTER — Ambulatory Visit: Payer: Self-pay | Admitting: Licensed Clinical Social Worker

## 2022-07-10 NOTE — Patient Outreach (Signed)
  Care Coordination   07/10/2022 Name: Hannah Young MRN: [id number] DOB: 04-Apr-1966   Care Coordination Outreach Attempts:  An unsuccessful telephone outreach was attempted today to offer the patient information about available care coordination services as a benefit of their health plan.    Received inbasket to return patients call. SW attempted phone number on file. Phone number is currently out of service.If patient contacts practice, please notify SW with updated phone number.    Follow Up Plan:  No further outreach attempts will be made at this time. We have been unable to contact the patient to offer or enroll patient in care coordination services  Encounter Outcome:  No Answer   Care Coordination Interventions:  No, not indicated    Christen Butter, Kenard Gower, MSW, LCSW-A  Social Worker [facility]  [phone]

## 2022-07-14 DIAGNOSIS — Z1152 Encounter for screening for COVID-19: Secondary | ICD-10-CM | POA: Diagnosis not present

## 2022-07-19 ENCOUNTER — Ambulatory Visit: Payer: Medicare Other | Admitting: Orthopaedic Surgery

## 2022-07-19 DIAGNOSIS — Z1152 Encounter for screening for COVID-19: Secondary | ICD-10-CM | POA: Diagnosis not present

## 2022-07-21 ENCOUNTER — Other Ambulatory Visit: Payer: Self-pay | Admitting: Internal Medicine

## 2022-07-21 DIAGNOSIS — F32A Depression, unspecified: Secondary | ICD-10-CM

## 2022-07-24 ENCOUNTER — Encounter: Payer: Medicare Other | Admitting: Student

## 2022-07-25 ENCOUNTER — Telehealth: Payer: Self-pay | Admitting: Orthopaedic Surgery

## 2022-07-25 NOTE — Telephone Encounter (Signed)
Tried to call. Number is not in service.

## 2022-07-25 NOTE — Telephone Encounter (Signed)
Pt called to set an appt but also asked for a call back from Lauren G. Pt did not leave a reason for call back. Pt phone number is [phone]

## 2022-07-26 DIAGNOSIS — Z1152 Encounter for screening for COVID-19: Secondary | ICD-10-CM | POA: Diagnosis not present

## 2022-07-31 ENCOUNTER — Ambulatory Visit
Admission: RE | Admit: 2022-07-31 | Discharge: 2022-07-31 | Disposition: A | Discharge status: Home / Self Care | Payer: Medicare Other | Source: Ambulatory Visit | Attending: Nurse Practitioner | Admitting: Nurse Practitioner

## 2022-07-31 ENCOUNTER — Ambulatory Visit
Admission: RE | Admit: 2022-07-31 | Discharge: 2022-07-31 | Disposition: A | Discharge status: Home / Self Care | Payer: Medicare Other | Source: Ambulatory Visit | Attending: Surgery | Admitting: Surgery

## 2022-07-31 DIAGNOSIS — M542 Cervicalgia: Secondary | ICD-10-CM | POA: Diagnosis not present

## 2022-07-31 DIAGNOSIS — K7460 Unspecified cirrhosis of liver: Secondary | ICD-10-CM | POA: Diagnosis not present

## 2022-07-31 DIAGNOSIS — M4802 Spinal stenosis, cervical region: Secondary | ICD-10-CM | POA: Diagnosis not present

## 2022-07-31 DIAGNOSIS — K7469 Other cirrhosis of liver: Secondary | ICD-10-CM

## 2022-07-31 DIAGNOSIS — K828 Other specified diseases of gallbladder: Secondary | ICD-10-CM | POA: Diagnosis not present

## 2022-07-31 DIAGNOSIS — K754 Autoimmune hepatitis: Secondary | ICD-10-CM

## 2022-08-02 DIAGNOSIS — Z1152 Encounter for screening for COVID-19: Secondary | ICD-10-CM | POA: Diagnosis not present

## 2022-08-03 ENCOUNTER — Encounter: Payer: Medicare Other | Admitting: Student

## 2022-08-03 NOTE — Progress Notes (Deleted)
Subjective:  [name] is a 56 y.o. female.  The primary encounter diagnosis was Type 2 diabetes mellitus without complication, with long-term current use of insulin ([place]). Diagnoses of Primary hypertension and Dyslipidemia were also pertinent to this visit.  Past Medical History:  Diagnosis Date   Anxiety    Arthritis    Asthma    Autoimmune hepatitis ([place])    Bipolar 1 disorder ([place])    Breast lump    Depression    Diabetes mellitus    FH: chemotherapy    Gout    High cholesterol    Hypertension    Liver cirrhosis (H[facility])    Lung nodules    Neuropathy    Panic disorder    STD exposure 02/23/2021   Vaginal discharge 01/25/2017   Vertigo     Current Outpatient Medications on File Prior to Visit  Medication Sig Dispense Refill   Accu-Chek Softclix Lancets lancets Use as instructed to check blood sugar 3x a day 300 each 3   albuterol (VENTOLIN HFA) 108 (90 Base) MCG/ACT inhaler INHALE ONE TO TWO puffs into THE lungs EVERY SIX HOURS AS NEEDED SHORTNESS OF BREATH (Patient taking differently: Inhale 2 puffs into the lungs every 6 (six) hours as needed for wheezing or shortness of breath. INHALE ONE TO TWO puffs into THE lungs EVERY SIX HOURS AS NEEDED SHORTNESS OF BREATH) 18 g 12   bacitracin ointment Apply 1 application topically 2 (two) times daily. 120 g 0   Blood Glucose Monitoring Suppl (ACCU-CHEK GUIDE ME) w/Device KIT Use 3 times per day to check your blood sugar. 1 kit 0   Capsaicin 0.025 % GEL Apply 4 application topically 3 (three) times daily. 60 g 3   celecoxib (CELEBREX) 200 MG capsule Take 1 capsule (200 mg total) by mouth 2 (two) times daily. 30 capsule 3   cyclobenzaprine (FLEXERIL) 10 MG tablet Take 1 tablet (10 mg total) by mouth 2 (two) times daily as needed for muscle spasms. 10 tablet 0   famotidine (PEPCID) 20 MG tablet Take 1 tablet (20 mg total) by mouth 2 (two) times daily. (Patient not taking: Reported on 05/27/2022) 60 tablet 0   fluticasone  (FLONASE) 50 MCG/ACT nasal spray Place 2 sprays into both nostrils daily as needed for allergies or rhinitis.      glucose blood (ACCU-CHEK GUIDE) test strip Use as instructed to check blood sugar 3 times a day 300 each 3   HUMALOG 100 UNIT/ML injection Inject 0.03 mLs (3 Units total) into the skin 3 (three) times daily with meals. 10 mL 2   hydrOXYzine (ATARAX/VISTARIL) 25 MG tablet Take 1 tablet (25 mg total) by mouth every 6 (six) hours. (Patient not taking: Reported on 05/27/2022) 12 tablet 0   LEVEMIR 100 UNIT/ML injection INJECT 0.69m (15units) into THE SKIN AT BEDTIME 10 mL 1   lisinopril (ZESTRIL) 10 MG tablet Take 1 tablet (10 mg total) by mouth daily. 90 tablet 3   mercaptopurine (PURINETHOL) 50 MG tablet Take 50 mg by mouth daily. Give on an empty stomach 1 hour before or 2 hours after meals. Caution: Chemotherapy.     metroNIDAZOLE (FLAGYL) 500 MG tablet Take 1 tablet (500 mg total) by mouth 2 (two) times daily. (Patient taking differently: Take 500 mg by mouth daily as needed (UTI).) 14 tablet 0   omeprazole (PRILOSEC) 40 MG capsule Take 40 mg by mouth daily.     ondansetron (ZOFRAN ODT) 4 MG disintegrating tablet Take  1 tablet (4 mg total) by mouth every 8 (eight) hours as needed for nausea or vomiting. (Patient not taking: Reported on 05/27/2022) 10 tablet 0   oxyCODONE (ROXICODONE) 5 MG immediate release tablet Take 0.5-1 pill bid prn pain (Patient taking differently: Take 2.5-5 mg by mouth 2 (two) times daily as needed for severe pain. Take 0.5-1 pill bid prn pain) 10 tablet 0   predniSONE (DELTASONE) 20 MG tablet Take 40 mg by mouth daily with breakfast.     traMADol (ULTRAM) 50 MG tablet Take 1 tablet (50 mg total) by mouth every 6 (six) hours as needed. (Patient taking differently: Take 50 mg by mouth every 6 (six) hours as needed for moderate pain.) 60 tablet 2   triamcinolone cream (KENALOG) 0.1 % Apply 1 application topically 2 (two) times daily. (Patient not taking: Reported on  05/27/2022) 30 g 0   venlafaxine XR (EFFEXOR-XR) 37.5 MG 24 hr capsule TAKE ONE TABLET BY MOUTH EVERY DAY 30 capsule 0   No current facility-administered medications on file prior to visit.    Past Surgical History:  Procedure Laterality Date   Biopsy of liver     CESAREAN SECTION     LAPAROSCOPY     TUBAL LIGATION      Family History  Problem Relation Age of Onset   Liver disease Mother    Kidney failure Father    Heart disease Sister    Liver disease Daughter    Anxiety disorder Daughter    Heart disease Son    Colon cancer Neg Hx    Stomach cancer Neg Hx    Migraines Neg Hx    Headache Neg Hx     Social History   Socioeconomic History   Marital status: Widowed    Spouse name: Not on file   Number of children: 4   Years of education: Not on file   Highest education level: Not on file  Occupational History   Not on file  Tobacco Use   Smoking status: Former    Packs/day: 0.30    Types: Cigarettes    Quit date: 08/22/2020    Years since quitting: 1.9   Smokeless tobacco: Never   Tobacco comments:    2-3 cigs/day  Vaping Use   Vaping Use: Never used  Substance and Sexual Activity   Alcohol use: Not Currently    Alcohol/week: 3.0 standard drinks of alcohol    Types: 3 Cans of beer per week   Drug use: No   Sexual activity: Not Currently    Birth control/protection: Surgical    Comment: States she is not currently active  Other Topics Concern   Not on file  Social History Narrative   Current Social History 07/19/2020        Patient lives 2 Grandchildren in a home which is 1 story. There are steps up to the entrance the patient uses.       Patient's method of transportation is her "aide".      The highest level of education was high school diploma.      The patient currently disabled.      Identified important Relationships are Children and Grandchildren       Pets : yes       Interests / Fun: "nothing        Current Stressors: health, isolation        Social Determinants of Health   Financial Resource Strain: Not on file  Food Insecurity: No Food  Insecurity (04/27/2021)   Hunger Vital Sign    Worried About Running Out of Food in the Last Year: Never true    [place] in the Last Year: Never true  Transportation Needs: No Transportation Needs (05/30/2022)   PRAPARE - Hydrologist (Medical): No    Lack of Transportation (Non-Medical): No  Physical Activity: Not on file  Stress: Not on file  Social Connections: Not on file  Intimate Partner Violence: Not on file    Review of Systems: ROS negative except for what is noted on the assessment and plan.  Objective:  There were no vitals filed for this visit.  Physical Exam  ***  Assessment & Plan:  No problem-specific Assessment & Plan notes found for this encounter.    No follow-ups on file.  Patient {GC/[id number]::"discussed with","seen with"} Dr. {[id number]::"PPJKDTOI","Z. Hoffman","Mullen","Narendra","Machen","Vincent","Guilloud","Lau"}  Nani Gasser MD 08/03/2022, 8:53 AM  Pager: [phone]

## 2022-08-04 ENCOUNTER — Ambulatory Visit: Payer: Medicare Other | Admitting: Orthopaedic Surgery

## 2022-08-23 ENCOUNTER — Other Ambulatory Visit: Payer: Self-pay | Admitting: Student

## 2022-08-23 DIAGNOSIS — F32A Depression, unspecified: Secondary | ICD-10-CM

## 2022-08-23 NOTE — Progress Notes (Signed)
CC: Follow up  HPI:  [name] is a 57 y.o. with medical history of HTN, HLD, DMII, GAD, Autoimmiune hepatitis c/b cirrhosis presenting to [facility] for a follow up. PCP is Dr. Altamease Young. Last seen 08/2021 and instructed to follow up in 4-6 weeks.   Please see problem-based list for further details, assessments, and plans.  Past Medical History:  Diagnosis Date   Anxiety    Arthritis    Asthma    Autoimmune hepatitis ([place])    Bipolar 1 disorder ([place])    Breast lump    Depression    Diabetes mellitus    FH: chemotherapy    Gout    Hair loss 10/21/2020   Headache 02/01/2021   High cholesterol    Housing situation unstable 03/22/2021   Hypertension    Liver cirrhosis (HCC)    Lung nodules    Neuropathy    Panic disorder    STD exposure 02/23/2021   Vaginal discharge 01/25/2017   Vertigo     Current Outpatient Medications (Endocrine & Metabolic):    insulin glargine (LANTUS) 100 UNIT/ML Solostar Pen, Inject 15 Units into the skin daily.   Insulin Lispro w/ Trans Port (HUMALOG TEMPO PEN) 100 UNIT/ML SOPN, Inject 3 Units into the skin 3 (three) times daily before meals.   metFORMIN (GLUCOPHAGE-XR) 500 MG 24 hr tablet, Take 1 tablet (500 mg total) by mouth daily with breakfast for 7 days, THEN 1 tablet (500 mg total) 2 (two) times daily with a meal for 7 days, THEN 1 tablet (500 mg total) 3 (three) times daily for 7 days, THEN 2 tablets (1,000 mg total) 2 (two) times daily with a meal for 7 days.   Semaglutide,0.25 or 0.5MG /DOS, 2 MG/3ML SOPN, Inject 0.25 mg into the skin once a week for 28 days.   predniSONE (DELTASONE) 20 MG tablet, Take 40 mg by mouth daily with breakfast.  Current Outpatient Medications (Cardiovascular):    rosuvastatin (CRESTOR) 20 MG tablet, Take 1 tablet (20 mg total) by mouth daily.   lisinopril (ZESTRIL) 10 MG tablet, Take 1 tablet (10 mg total) by mouth daily.  Current Outpatient Medications (Respiratory):    albuterol (VENTOLIN HFA) 108 (90 Base)  MCG/ACT inhaler, INHALE ONE TO TWO puffs into THE lungs EVERY SIX HOURS AS NEEDED SHORTNESS OF BREATH (Patient taking differently: Inhale 2 puffs into the lungs every 6 (six) hours as needed for wheezing or shortness of breath. INHALE ONE TO TWO puffs into THE lungs EVERY SIX HOURS AS NEEDED SHORTNESS OF BREATH)   fluticasone (FLONASE) 50 MCG/ACT nasal spray, Place 2 sprays into both nostrils daily as needed for allergies or rhinitis.   Current Outpatient Medications (Analgesics):    celecoxib (CELEBREX) 200 MG capsule, Take 1 capsule (200 mg total) by mouth 2 (two) times daily.   traMADol (ULTRAM) 50 MG tablet, Take 1 tablet (50 mg total) by mouth every 6 (six) hours as needed. (Patient taking differently: Take 50 mg by mouth every 6 (six) hours as needed for moderate pain.)   Current Outpatient Medications (Other):    blood glucose meter kit and supplies, Dispense based on patient and insurance preference. Use up to four times daily as directed. (FOR ICD-10 E10.9, E11.9).   Insulin Pen Needle (PEN NEEDLES) 32G X 4 MM MISC, 1 Needle by Does not apply route in the morning, at noon, in the evening, and at bedtime.   Accu-Chek Softclix Lancets lancets, Use as instructed to check blood sugar 3x a day  bacitracin ointment, Apply 1 application topically 2 (two) times daily.   Blood Glucose Monitoring Suppl (ACCU-CHEK GUIDE ME) w/Device KIT, Use 3 times per day to check your blood sugar.   Capsaicin 0.025 % GEL, Apply 4 application topically 3 (three) times daily.   cyclobenzaprine (FLEXERIL) 10 MG tablet, Take 1 tablet (10 mg total) by mouth 2 (two) times daily as needed for muscle spasms.   glucose blood (ACCU-CHEK GUIDE) test strip, Use as instructed to check blood sugar 3 times a day   mercaptopurine (PURINETHOL) 50 MG tablet, Take 50 mg by mouth daily. Give on an empty stomach 1 hour before or 2 hours after meals. Caution: Chemotherapy.   omeprazole (PRILOSEC) 40 MG capsule, Take 40 mg by mouth  daily.   ondansetron (ZOFRAN ODT) 4 MG disintegrating tablet, Take 1 tablet (4 mg total) by mouth every 8 (eight) hours as needed for nausea or vomiting. (Patient not taking: Reported on 05/27/2022)   triamcinolone cream (KENALOG) 0.1 %, Apply 1 application topically 2 (two) times daily. (Patient not taking: Reported on 05/27/2022)   venlafaxine XR (EFFEXOR-XR) 37.5 MG 24 hr capsule, TAKE ONE CAPSULE BY MOUTH EVERY DAY  Review of Systems:  Review of system negative unless stated in the problem list or HPI.    Physical Exam:  Vitals:   08/24/22 0904  BP: 128/88  Pulse: 81  Temp: 98.6 F (37 C)  TempSrc: Oral  SpO2: 100%  Weight: 189 lb 6.4 oz (85.9 kg)  Height: 5\' 1"  (1.549 m)    Physical Exam General: NAD HENT: NCAT Lungs: CTAB, no wheeze, rhonchi or rales.  Cardiovascular: Normal heart sounds, no r/m/g, 2+ pulses in all extremities. No LE edema Abdomen: No TTP, normal bowel sounds MSK: No asymmetry or muscle atrophy.  Skin: no lesions noted on exposed skin Neuro: Alert and oriented x4. CN grossly intact Psych: Normal mood and normal affect   Assessment & Plan:   Hypertension Pt has HTN. On lisinopril 10 mg qd. Does not check her blood pressure at home. BP in clinic 128/88. Missed 2/14 days of her lisinopril. Normal Cr 05/2022. Continue lisinopril 10 mg daily.   Diabetes mellitus ([place]) Pt has DMII. Her A1c was 9.2 10/2020. On Levemir 15 units. Humalog 3 units with meals. A1c this visit is 9.0. She states she lost her meter and last checked her glucose one month ago. Given uncontrolled DMII and unreliable glucose monitoring, will continue insulin at current regimen but change to pen for ease of use and add Ozempic and metformin for better glycemic control.  -Continue insulin at current dose -Add Ozempic 0.25 mg weekly and titrate up monthly -Add metformin 500 mg qd and titrate up to 1000 mg BID -Will send in glucometer and glucose testing supplies to pharmacy.  -One month  DMII follow up.  HLD (hyperlipidemia) Pt's has HLD and ASCVD risk of 7.5 %. Given uncontrolled diabetes, will start Crestor 20 mg qd. Plan to recheck lipid panel in 4-6 weeks to assess response.   Healthcare maintenance GI referral placed for colonoscopy and referral for eye exam.   Multiple lung nodules on CT No evidence of repeat imaging on chart review. Will discuss repeating imaging with patient at next OV.    See Encounters Tab for problem based charting.  Patient discussed with Dr. Dollene Cleveland, MD Tillie Rung. [facility] Internal Medicine Residency, PGY-2

## 2022-08-24 ENCOUNTER — Encounter: Payer: Self-pay | Admitting: Internal Medicine

## 2022-08-24 ENCOUNTER — Other Ambulatory Visit: Payer: Self-pay

## 2022-08-24 ENCOUNTER — Other Ambulatory Visit (HOSPITAL_COMMUNITY)
Admission: RE | Admit: 2022-08-24 | Discharge: 2022-08-24 | Disposition: A | Discharge status: Home / Self Care | Payer: Medicare Other | Source: Ambulatory Visit | Attending: Internal Medicine | Admitting: Internal Medicine

## 2022-08-24 ENCOUNTER — Ambulatory Visit (INDEPENDENT_AMBULATORY_CARE_PROVIDER_SITE_OTHER): Payer: Medicare Other | Admitting: Internal Medicine

## 2022-08-24 VITALS — BP 128/88 | HR 81 | Temp 98.6°F | Ht 61.0 in | Wt 189.4 lb

## 2022-08-24 DIAGNOSIS — A64 Unspecified sexually transmitted disease: Secondary | ICD-10-CM | POA: Insufficient documentation

## 2022-08-24 DIAGNOSIS — Z0000 Encounter for general adult medical examination without abnormal findings: Secondary | ICD-10-CM

## 2022-08-24 DIAGNOSIS — R918 Other nonspecific abnormal finding of lung field: Secondary | ICD-10-CM

## 2022-08-24 DIAGNOSIS — Z794 Long term (current) use of insulin: Secondary | ICD-10-CM | POA: Diagnosis not present

## 2022-08-24 DIAGNOSIS — Z87891 Personal history of nicotine dependence: Secondary | ICD-10-CM | POA: Diagnosis not present

## 2022-08-24 DIAGNOSIS — Z7984 Long term (current) use of oral hypoglycemic drugs: Secondary | ICD-10-CM | POA: Diagnosis not present

## 2022-08-24 DIAGNOSIS — E119 Type 2 diabetes mellitus without complications: Secondary | ICD-10-CM

## 2022-08-24 DIAGNOSIS — M2550 Pain in unspecified joint: Secondary | ICD-10-CM | POA: Diagnosis not present

## 2022-08-24 DIAGNOSIS — E785 Hyperlipidemia, unspecified: Secondary | ICD-10-CM | POA: Diagnosis not present

## 2022-08-24 DIAGNOSIS — Z1211 Encounter for screening for malignant neoplasm of colon: Secondary | ICD-10-CM

## 2022-08-24 DIAGNOSIS — I10 Essential (primary) hypertension: Secondary | ICD-10-CM

## 2022-08-24 DIAGNOSIS — E669 Obesity, unspecified: Secondary | ICD-10-CM | POA: Diagnosis not present

## 2022-08-24 LAB — GLUCOSE, CAPILLARY: Glucose-Capillary: 124 mg/dL — ABNORMAL HIGH (ref 70–99)

## 2022-08-24 LAB — POCT GLYCOSYLATED HEMOGLOBIN (HGB A1C): Hemoglobin A1C: 9 % — AB (ref 4.0–5.6)

## 2022-08-24 MED ORDER — INSULIN GLARGINE 100 UNIT/ML SOLOSTAR PEN: 15.0000 [IU] | PEN_INJECTOR | Freq: Every day | SUBCUTANEOUS | 0 refills | Status: DC

## 2022-08-24 MED ORDER — METFORMIN HCL ER 500 MG PO TB24: ORAL_TABLET | ORAL | 0 refills | Status: DC

## 2022-08-24 MED ORDER — HUMALOG TEMPO PEN 100 UNIT/ML ~~LOC~~ SOPN: 3.0000 [IU] | PEN_INJECTOR | Freq: Three times a day (TID) | SUBCUTANEOUS | 0 refills | Status: DC

## 2022-08-24 MED ORDER — PEN NEEDLES 32G X 4 MM MISC: 1.0000 | Freq: Four times a day (QID) | 2 refills | Status: DC

## 2022-08-24 MED ORDER — SEMAGLUTIDE(0.25 OR 0.5MG/DOS) 2 MG/3ML ~~LOC~~ SOPN: 0.2500 mg | PEN_INJECTOR | SUBCUTANEOUS | 0 refills | Status: DC

## 2022-08-24 NOTE — Patient Instructions (Addendum)
[name], it was a pleasure seeing you today! You endorsed feeling well today. Below are some of the things we talked about this visit. We look forward to seeing you in the follow up appointment!  Today we discussed: We ordered some medications for your diabetes as it is uncontrolled.  We will do some lab work today including STI testing.  We would like to see you back in 4 weeks.   I have ordered the following labs today:  Lab Orders         Glucose, capillary         Lipid Profile         HIV antibody (with reflex)         RPR         Hepatitis B Surface Antigen         POC Hbg A1C       Referrals ordered today:   Referral Orders         Ambulatory referral to Gastroenterology         Ambulatory referral to Ophthalmology      I have ordered the following medication/changed the following medications:   Stop the following medications: Medications Discontinued During This Encounter  Medication Reason   oxyCODONE (ROXICODONE) 5 MG immediate release tablet Patient has not taken in last 30 days   HUMALOG 100 UNIT/ML injection Change in therapy   LEVEMIR 100 UNIT/ML injection Change in therapy     Start the following medications: Meds ordered this encounter  Medications   metFORMIN (GLUCOPHAGE-XR) 500 MG 24 hr tablet    Sig: Take 1 tablet (500 mg total) by mouth daily with breakfast for 7 days, THEN 1 tablet (500 mg total) 2 (two) times daily with a meal for 7 days, THEN 1 tablet (500 mg total) 3 (three) times daily for 7 days, THEN 2 tablets (1,000 mg total) 2 (two) times daily with a meal for 7 days.    Dispense:  70 tablet    Refill:  0   Semaglutide,0.25 or 0.5MG /DOS, 2 MG/3ML SOPN    Sig: Inject 0.25 mg into the skin once a week for 28 days.    Dispense:  3 mL    Refill:  0   insulin glargine (LANTUS) 100 UNIT/ML Solostar Pen    Sig: Inject 15 Units into the skin daily.    Dispense:  15 mL    Refill:  0   Insulin Pen Needle (PEN NEEDLES) 32G X 4 MM MISC     Sig: 1 Needle by Does not apply route in the morning, at noon, in the evening, and at bedtime.    Dispense:  100 each    Refill:  2   Insulin Lispro w/ Trans Port (HUMALOG TEMPO PEN) 100 UNIT/ML SOPN    Sig: Inject 3 Units into the skin 3 (three) times daily before meals.    Dispense:  3 mL    Refill:  0     Follow-up: 4 week follow up  Please make sure to arrive 15 minutes prior to your next appointment. If you arrive late, you may be asked to reschedule.   We look forward to seeing you next time. Please call our clinic at [phone] if you have any questions or concerns. The best time to call is Monday-Friday from 9am-4pm, but there is someone available 24/7. If after hours or the weekend, call the main hospital number and ask for the Internal Medicine Resident On-Call. If you  need medication refills, please notify your pharmacy one week in advance and they will send [place] a request.  Thank you for letting us take part in your care. Wishing you the best!  Thank you, [name], MD

## 2022-08-25 ENCOUNTER — Telehealth: Payer: Self-pay | Admitting: *Deleted

## 2022-08-25 ENCOUNTER — Telehealth: Payer: Self-pay

## 2022-08-25 ENCOUNTER — Other Ambulatory Visit: Payer: Self-pay

## 2022-08-25 DIAGNOSIS — Z794 Long term (current) use of insulin: Secondary | ICD-10-CM

## 2022-08-25 LAB — CERVICOVAGINAL ANCILLARY ONLY
Bacterial Vaginitis (gardnerella): NEGATIVE
Candida Glabrata: NEGATIVE
Candida Vaginitis: NEGATIVE
Chlamydia: NEGATIVE
Comment: NEGATIVE
Comment: NEGATIVE
Comment: NEGATIVE
Comment: NEGATIVE
Comment: NEGATIVE
Comment: NORMAL
Neisseria Gonorrhea: NEGATIVE
Trichomonas: NEGATIVE

## 2022-08-25 LAB — HEPATITIS B SURFACE ANTIGEN: Hepatitis B Surface Ag: NEGATIVE

## 2022-08-25 LAB — RPR: RPR Ser Ql: NONREACTIVE

## 2022-08-25 LAB — LIPID PANEL
Chol/HDL Ratio: 2.6 ratio (ref 0.0–4.4)
Cholesterol, Total: 242 mg/dL — ABNORMAL HIGH (ref 100–199)
HDL: 92 mg/dL (ref >39)
LDL Chol Calc (NIH): 121 mg/dL — ABNORMAL HIGH (ref 0–99)
Triglycerides: 168 mg/dL — ABNORMAL HIGH (ref 0–149)
VLDL Cholesterol Cal: 29 mg/dL (ref 5–40)

## 2022-08-25 LAB — HIV ANTIBODY (ROUTINE TESTING W REFLEX): HIV Screen 4th Generation wRfx: NONREACTIVE

## 2022-08-25 MED ORDER — ACCU-CHEK GUIDE ME W/DEVICE KIT: PACK | 0 refills | Status: AC

## 2022-08-25 MED ORDER — ACCU-CHEK SOFTCLIX LANCETS MISC: 3 refills | Status: DC

## 2022-08-25 MED ORDER — BLOOD GLUCOSE METER KIT: PACK | 0 refills | Status: DC

## 2022-08-25 MED ORDER — ROSUVASTATIN CALCIUM 20 MG PO TABS: 20.0000 mg | ORAL_TABLET | Freq: Every day | ORAL | 11 refills | Status: DC

## 2022-08-25 MED ORDER — ACCU-CHEK GUIDE VI STRP: ORAL_STRIP | 3 refills | Status: DC

## 2022-08-25 NOTE — Telephone Encounter (Signed)
Pt called regarding test results.  Pt says that her phone is broken and she does not have access to computer.  RNCM referred pt to Internal Medicine Clinic to assist with this matter.

## 2022-08-25 NOTE — Telephone Encounter (Signed)
Requesting new meter, lancets and test strips @ [facility].

## 2022-08-25 NOTE — Telephone Encounter (Signed)
Requesting lab results, please call pt back.  

## 2022-08-26 ENCOUNTER — Encounter: Payer: Self-pay | Admitting: Internal Medicine

## 2022-08-26 DIAGNOSIS — E785 Hyperlipidemia, unspecified: Secondary | ICD-10-CM | POA: Insufficient documentation

## 2022-08-26 NOTE — Assessment & Plan Note (Signed)
No evidence of repeat imaging on chart review. Will discuss repeating imaging with patient at next OV.

## 2022-08-26 NOTE — Assessment & Plan Note (Signed)
Pt's has HLD and ASCVD risk of 7.5 %. Given uncontrolled diabetes, will start Crestor 20 mg qd. Plan to recheck lipid panel in 4-6 weeks to assess response.

## 2022-08-26 NOTE — Assessment & Plan Note (Signed)
Pt has DMII. Her A1c was 9.2 10/2020. On Levemir 15 units. Humalog 3 units with meals. A1c this visit is 9.0. She states she lost her meter and last checked her glucose one month ago. Given uncontrolled DMII and unreliable glucose monitoring, will continue insulin at current regimen but change to pen for ease of use and add Ozempic and metformin for better glycemic control.  -Continue insulin at current dose -Add Ozempic 0.25 mg weekly and titrate up monthly -Add metformin 500 mg qd and titrate up to 1000 mg BID -Will send in glucometer and glucose testing supplies to pharmacy.  -One month DMII follow up.

## 2022-08-26 NOTE — Assessment & Plan Note (Signed)
Pt has HTN. On lisinopril 10 mg qd. Does not check her blood pressure at home. BP in clinic 128/88. Missed 2/14 days of her lisinopril. Normal Cr 05/2022. Continue lisinopril 10 mg daily.

## 2022-08-26 NOTE — Assessment & Plan Note (Signed)
GI referral placed for colonoscopy and referral for eye exam.

## 2022-08-26 NOTE — Assessment & Plan Note (Deleted)
GI referral placed for colonoscopy. 

## 2022-08-29 ENCOUNTER — Telehealth: Payer: Self-pay

## 2022-08-29 NOTE — Telephone Encounter (Signed)
Requesting test results, please call pt back.

## 2022-08-29 NOTE — Telephone Encounter (Signed)
Provider spoke with patient on 1/12 and relayed lab results. Call placed to patient. States she has a different MyChart account now. Results went to old account. She has spoken to [employer] and they told her to contact her PCP to have results be resent to [facility].

## 2022-08-29 NOTE — Progress Notes (Signed)
Internal Medicine Clinic Attending  Case discussed with Dr. Khan  At the time of the visit.  We reviewed the resident's history and exam and pertinent patient test results.  I agree with the assessment, diagnosis, and plan of care documented in the resident's note.  

## 2022-08-31 ENCOUNTER — Other Ambulatory Visit (HOSPITAL_COMMUNITY): Payer: Self-pay

## 2022-09-05 ENCOUNTER — Ambulatory Visit (INDEPENDENT_AMBULATORY_CARE_PROVIDER_SITE_OTHER): Payer: 59 | Admitting: Student

## 2022-09-05 ENCOUNTER — Encounter: Payer: Self-pay | Admitting: Student

## 2022-09-05 VITALS — BP 148/98 | HR 95 | Temp 98.0°F | Ht 61.0 in | Wt 191.3 lb

## 2022-09-05 DIAGNOSIS — K754 Autoimmune hepatitis: Secondary | ICD-10-CM

## 2022-09-05 DIAGNOSIS — E785 Hyperlipidemia, unspecified: Secondary | ICD-10-CM

## 2022-09-05 DIAGNOSIS — Z794 Long term (current) use of insulin: Secondary | ICD-10-CM

## 2022-09-05 DIAGNOSIS — E119 Type 2 diabetes mellitus without complications: Secondary | ICD-10-CM | POA: Diagnosis not present

## 2022-09-05 DIAGNOSIS — Z7985 Long-term (current) use of injectable non-insulin antidiabetic drugs: Secondary | ICD-10-CM

## 2022-09-05 DIAGNOSIS — Z7984 Long term (current) use of oral hypoglycemic drugs: Secondary | ICD-10-CM

## 2022-09-05 DIAGNOSIS — Z87891 Personal history of nicotine dependence: Secondary | ICD-10-CM | POA: Diagnosis not present

## 2022-09-05 DIAGNOSIS — I10 Essential (primary) hypertension: Secondary | ICD-10-CM

## 2022-09-05 DIAGNOSIS — L659 Nonscarring hair loss, unspecified: Secondary | ICD-10-CM

## 2022-09-05 NOTE — Progress Notes (Signed)
Subjective:  CC: follow up  HPI:  Hannah Young is a 57 y.o. female with a past medical history stated below and presents today for DM, HTN, and alopecia follow up. Please see problem based assessment and plan for additional details.  Past Medical History:  Diagnosis Date   Anxiety    Arthritis    Asthma    Autoimmune hepatitis (HCC)    Bipolar 1 disorder (HCC)    Breast lump    Depression    Diabetes mellitus    FH: chemotherapy    Gout    Hair loss 10/21/2020   Headache 02/01/2021   High cholesterol    Housing situation unstable 03/22/2021   Hypertension    Liver cirrhosis (HCC)    Lung nodules    Neuropathy    Panic disorder    STD exposure 02/23/2021   Vaginal discharge 01/25/2017   Vertigo     Current Outpatient Medications on File Prior to Visit  Medication Sig Dispense Refill   Accu-Chek Softclix Lancets lancets Use as instructed to check blood sugar 3x a day 300 each 3   albuterol (VENTOLIN HFA) 108 (90 Base) MCG/ACT inhaler INHALE ONE TO TWO puffs into THE lungs EVERY SIX HOURS AS NEEDED SHORTNESS OF BREATH (Patient taking differently: Inhale 2 puffs into the lungs every 6 (six) hours as needed for wheezing or shortness of breath. INHALE ONE TO TWO puffs into THE lungs EVERY SIX HOURS AS NEEDED SHORTNESS OF BREATH) 18 g 12   bacitracin ointment Apply 1 application topically 2 (two) times daily. 120 g 0   blood glucose meter kit and supplies Dispense based on patient and insurance preference. Use up to four times daily as directed. (FOR ICD-10 E10.9, E11.9). 1 each 0   Blood Glucose Monitoring Suppl (ACCU-CHEK GUIDE ME) w/Device KIT Use 3 times per day to check your blood sugar. 1 kit 0   Capsaicin 0.025 % GEL Apply 4 application topically 3 (three) times daily. 60 g 3   celecoxib (CELEBREX) 200 MG capsule Take 1 capsule (200 mg total) by mouth 2 (two) times daily. 30 capsule 3   cyclobenzaprine (FLEXERIL) 10 MG tablet Take 1 tablet (10 mg total) by  mouth 2 (two) times daily as needed for muscle spasms. 10 tablet 0   fluticasone (FLONASE) 50 MCG/ACT nasal spray Place 2 sprays into both nostrils daily as needed for allergies or rhinitis.      glucose blood (ACCU-CHEK GUIDE) test strip Use as instructed to check blood sugar 3 times a day 300 each 3   insulin glargine (LANTUS) 100 UNIT/ML Solostar Pen Inject 15 Units into the skin daily. 15 mL 0   Insulin Lispro w/ Trans Port (HUMALOG TEMPO PEN) 100 UNIT/ML SOPN Inject 3 Units into the skin 3 (three) times daily before meals. 3 mL 0   Insulin Pen Needle (PEN NEEDLES) 32G X 4 MM MISC 1 Needle by Does not apply route in the morning, at noon, in the evening, and at bedtime. 100 each 2   lisinopril (ZESTRIL) 10 MG tablet Take 1 tablet (10 mg total) by mouth daily. 90 tablet 3   mercaptopurine (PURINETHOL) 50 MG tablet Take 50 mg by mouth daily. Give on an empty stomach 1 hour before or 2 hours after meals. Caution: Chemotherapy.     metFORMIN (GLUCOPHAGE-XR) 500 MG 24 hr tablet Take 1 tablet (500 mg total) by mouth daily with breakfast for 7 days, THEN 1 tablet (500 mg  total) 2 (two) times daily with a meal for 7 days, THEN 1 tablet (500 mg total) 3 (three) times daily for 7 days, THEN 2 tablets (1,000 mg total) 2 (two) times daily with a meal for 7 days. 70 tablet 0   omeprazole (PRILOSEC) 40 MG capsule Take 40 mg by mouth daily.     ondansetron (ZOFRAN ODT) 4 MG disintegrating tablet Take 1 tablet (4 mg total) by mouth every 8 (eight) hours as needed for nausea or vomiting. (Patient not taking: Reported on 05/27/2022) 10 tablet 0   predniSONE (DELTASONE) 20 MG tablet Take 40 mg by mouth daily with breakfast.     rosuvastatin (CRESTOR) 20 MG tablet Take 1 tablet (20 mg total) by mouth daily. 30 tablet 11   Semaglutide,0.25 or 0.5MG /DOS, 2 MG/3ML SOPN Inject 0.25 mg into the skin once a week for 28 days. 3 mL 0   traMADol (ULTRAM) 50 MG tablet Take 1 tablet (50 mg total) by mouth every 6 (six) hours as  needed. (Patient taking differently: Take 50 mg by mouth every 6 (six) hours as needed for moderate pain.) 60 tablet 2   triamcinolone cream (KENALOG) 0.1 % Apply 1 application topically 2 (two) times daily. (Patient not taking: Reported on 05/27/2022) 30 g 0   venlafaxine XR (EFFEXOR-XR) 37.5 MG 24 hr capsule TAKE ONE CAPSULE BY MOUTH EVERY DAY 30 capsule 0   No current facility-administered medications on file prior to visit.    Family History  Problem Relation Age of Onset   Liver disease Mother    Kidney failure Father    Heart disease Sister    Liver disease Daughter    Anxiety disorder Daughter    Heart disease Son    Colon cancer Neg Hx    Stomach cancer Neg Hx    Migraines Neg Hx    Headache Neg Hx     Social History   Socioeconomic History   Marital status: Widowed    Spouse name: Not on file   Number of children: 4   Years of education: Not on file   Highest education level: Not on file  Occupational History   Not on file  Tobacco Use   Smoking status: Former    Packs/day: 0.30    Types: Cigarettes    Quit date: 08/22/2020    Years since quitting: 2.0   Smokeless tobacco: Never   Tobacco comments:    2-3 cigs/day  Vaping Use   Vaping Use: Never used  Substance and Sexual Activity   Alcohol use: Not Currently    Alcohol/week: 3.0 standard drinks of alcohol    Types: 3 Cans of beer per week   Drug use: No   Sexual activity: Not Currently    Birth control/protection: Surgical    Comment: States she is not currently active  Other Topics Concern   Not on file  Social History Narrative   Current Social History 07/19/2020        Patient lives 2 Grandchildren in a home which is 1 story. There are steps up to the entrance the patient uses.       Patient's method of transportation is her "aide".      The highest level of education was high school diploma.      The patient currently disabled.      Identified important Relationships are Children and  Grandchildren       Pets : yes       Interests / Fun: "  nothing        Current Stressors: health, isolation       Social Determinants of Health   Financial Resource Strain: Not on file  Food Insecurity: No Food Insecurity (08/24/2022)   Hunger Vital Sign    Worried About Running Out of Food in the Last Year: Never true    Ran Out of Food in the Last Year: Never true  Transportation Needs: No Transportation Needs (08/24/2022)   PRAPARE - Hydrologist (Medical): No    Lack of Transportation (Non-Medical): No  Physical Activity: Not on file  Stress: Not on file  Social Connections: Socially Isolated (08/24/2022)   Social Connection and Isolation Panel [NHANES]    Frequency of Communication with Friends and Family: More than three times a week    Frequency of Social Gatherings with Friends and Family: More than three times a week    Attends Religious Services: Never    Marine scientist or Organizations: No    Attends Archivist Meetings: Never    Marital Status: Widowed  Intimate Partner Violence: Not At Risk (08/24/2022)   Humiliation, Afraid, Rape, and Kick questionnaire    Fear of Current or Ex-Partner: No    Emotionally Abused: No    Physically Abused: No    Sexually Abused: No    Review of Systems: ROS negative except for what is noted on the assessment and plan.  Objective:   Vitals:   09/05/22 0940 09/05/22 1002  BP: (!) 145/88 (!) 148/98  Pulse: 96 95  Temp: 98 F (36.7 C)   TempSrc: Oral   SpO2: 100%   Weight: 191 lb 4.8 oz (86.8 kg)   Height: 5\' 1"  (1.549 m)     Physical Exam: Constitutional: well-appearing woman sitting in [place], in no acute distress HENT: normocephalic atraumatic, mucous membranes moist Eyes: conjunctiva non-erythematous Neck: supple, no JVD Cardiovascular: regular rate and rhythm, no m/r/g Pulmonary/Chest: normal work of breathing on room air, lungs clear to auscultation  bilaterally Abdominal: soft, non-tender, non-distended, no fluid wave or evidence of ascites MSK: normal bulk and tone, brace over the L knee Neurological: alert & oriented x 3 Skin: warm and dry Psych: Pleasant mood and normal affect     Assessment & Plan:   Autoimmune hepatitis ([place]) Biopsy-proven autoimmune hepatitis diagnosed in 2011.  Prednisone and mercaptopurine until 2015 ( Dr. May God) Jan 2023 referred to [facility] jaundice. 06/2021  AST was 301, ALT 788, IgG 2674 Repeat biopsy at that time also reported chronic hepatitis with stage III-4 fibrosis.  Last seen at [facility] 06/30/2022 with Dr. Beverley Fiedler. On prednisone 20 mg and mercaptopurine.  US abdomen: gallbaldder sludge without abnormalities. No focal lesion identified. Normal homogeneous echogenicity.  AFP, PT, INR,  IgG 1284 (normal) AST 133, ASL 137, improving.  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 No bleeding  Patient reports she had been inconsistent with medications last year, but has been better since being seen at [place]. Patient is afraid of losing her liver and is encouraged to continue medication. Denies bleeding, abdominal distension. No fluid wave or ascites noted on exam. No abdominal tenderness. Patient is to follow with [facility] in February 2024 -Continue Prednisone and Mercaptopurine  Hypertension BP today elevated. Did not take BP meds this AM as she was rushing to make it  to appointments and had an angry outburst with family member this AM. She would like to continue monitoring -Follow BP at follow up -Continue Lisinopril 10 mg; patient has a history of compensated cirrhosis 2/2 autoimmune hepatitis. Consider switching medications at OV if needs medication changes  Diabetes mellitus (HCC) DM A1c 1/11 9.0 Medication changes since last visit: Levemir  15units, increased  Humalog 3u WC 1/11 - OV added Ozempic 0.25 mg, started last week. No side effects as of now. Metformin now 1000 mg, no GI side effect.   Patient did not bring glucose meter. She denies hypovolemic or hypoglycemic episodes. No polydipsia, polyuria, or changes in vision. No lethargy. Discussed importance of diabetic control especially in the setting of chronic prednisone use. \  Plan: -Review glucose meter readings, increase meal time if needed -Increase dose of Ozempic and verify increase of Metformin at OV follow up -Inquire about Levemir; patient has enough at home but will need to be switched to Lantus -Urine microalbumin/ Cr at next OV, patient just voided prior to the appointment   HLD (hyperlipidemia) No side effects noted. Patient is compliant with new medication -Continue Crestor 20  mg  Alopecia Patient presenting with concerns of central scalp alopecia that has been going on for months, but it has been especially bothersome in the past 3 months. She is having significant stress and shame for this and is seeking help. She is also worried that her current home and its mold-infestation is affecting her scalp. No pruritus, irritation, dandruff, or breaks in the skin.  Patient had previously been perimenopausal, and has not menstruated in >1 year.   Patient is wearing box braids with area of central clearing in the middle of scalp, no scales, thickening, erythema, organisms seen on examination. Broken hair follicles with terminal thinning diffusely noted on exam. There is visible reduction of hair coverage on scalp  Given the exam above, low risk for this to be tinea capitis, atopic dermatitis, psoriasis. There is no distinct pattern of alopecia areata. Differential diagnosis likely to be playing a role are female pattern hair loss, telogen effluvium, traction alopecia, and central centrifugal cicatricial alopecia.  No overt hyperandrogenism signs on exam.  Discussed  with patient that box brains are likely worsening her presentation given traction. Concerned that long standing steroids also contributory.   Will initially screen for anemia (microcytic) and thyroid disorders. If normal, will trial Topical minoxidil Apply 1 mL twice daily. If patient needs further antihypertensive coverage, would trial spironolactone for its antiandrogenic effects.  - F/u CBC, if microcytic -> Iron studies - f/u TSH - consider topical minoxidil trial - consider spironolactone  - Referral to dermatology    Patient discussed with Dr. Antony Contras

## 2022-09-05 NOTE — Assessment & Plan Note (Signed)
BP today elevated. Did not take BP meds this AM as she was rushing to make it to appointments and had an angry outburst with family member this AM. She would like to continue monitoring -Follow BP at follow up -Continue Lisinopril 10 mg; patient has a history of compensated cirrhosis 2/2 autoimmune hepatitis. Consider switching medications at OV if needs medication changes

## 2022-09-05 NOTE — Assessment & Plan Note (Addendum)
Biopsy-proven autoimmune hepatitis diagnosed in 2011.  Prednisone and mercaptopurine until 2015 ( Dr. May God) Jan 2023 referred to [facility] jaundice. 06/2021  AST was 301, ALT 788, IgG 2674 Repeat biopsy at that time also reported chronic hepatitis with stage III-4 fibrosis.  Last seen at [facility] 06/30/2022 with Dr. Beverley Fiedler. On prednisone 20 mg and mercaptopurine.  US abdomen: gallbaldder sludge without abnormalities. No focal lesion identified. Normal homogeneous echogenicity.  AFP, PT, INR,  IgG 1284 (normal) AST 133, ASL 137, improving.  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 No bleeding  Patient reports she had been inconsistent with medications last year, but has been better since being seen at [place]. Patient is afraid of losing her liver and is encouraged to continue medication. Denies bleeding, abdominal distension. No fluid wave or ascites noted on exam. No abdominal tenderness. Patient is to follow with [facility] in February 2024 -Continue Prednisone and Mercaptopurine

## 2022-09-05 NOTE — Patient Instructions (Signed)
Thank you, Ms.Donalynn Furlong for allowing [place] to provide your care today. Today we discussed  Diabetes: Please bring your glucose meter next time We need a urine sample from you next time Continue the medications as you were initally ordered Let us know when your Levemir insulin is running out as we will need to change this to another type of insulin  Blood pressure Continue taking Lisinopril. Monitor your blood pressure at home If it continues elevated, we need to add another medication  Hair loss It is likely this is associated with menopause. But we are checking some blood work to make sure you are not anemic or low ieon or thyroid problems.   If the blood work comes back norma, we are going to start a topical medication called Rogane.  We are also referring you to the dermatologist to help evaluate your skin conditions.  I have ordered the following labs for you:   Lab Orders         CBC no Diff         BMP8+Anion Gap         Iron, TIBC and Ferritin Panel      I will call if any are abnormal. All of your labs can be accessed through "My Chart".  I have place a referrals to  dermatology for alopecia .      My Chart Access: [email or web address]  Please follow-up in in 4 weeks.  Please make sure to arrive 15 minutes prior to your next appointment. If you arrive late, you may be asked to reschedule.    We look forward to seeing you next time. Please call our clinic at [phone] if you have any questions or concerns. The best time to call is Monday-Friday from 9am-4pm, but there is someone available 24/7. If after hours or the weekend, call the main hospital number and ask for the Internal Medicine Resident On-Call. If you need medication refills, please notify your pharmacy one week in advance and they will send [place] a request.   Thank you for letting us take part in your care. Wishing you the best!  Romana Juniper,  MD 09/05/2022, 10:11 AM [facility] Internal [facility] Resident, PGY-1

## 2022-09-05 NOTE — Assessment & Plan Note (Signed)
No side effects noted. Patient is compliant with new medication -Continue Crestor 20  mg

## 2022-09-05 NOTE — Assessment & Plan Note (Addendum)
Patient presenting with concerns of central scalp alopecia that has been going on for months, but it has been especially bothersome in the past 3 months. She is having significant stress and shame for this and is seeking help. She is also worried that her current home and its mold-infestation is affecting her scalp. No pruritus, irritation, dandruff, or breaks in the skin.  Patient had previously been perimenopausal, and has not menstruated in >1 year.   Patient is wearing box braids with area of central clearing in the middle of scalp, no scales, thickening, erythema, organisms seen on examination. Broken hair follicles with terminal thinning diffusely noted on exam. There is visible reduction of hair coverage on scalp  Given the exam above, low risk for this to be tinea capitis, atopic dermatitis, psoriasis. There is no distinct pattern of alopecia areata. Differential diagnosis likely to be playing a role are female pattern hair loss, telogen effluvium, traction alopecia, and central centrifugal cicatricial alopecia.  No overt hyperandrogenism signs on exam.  Discussed with patient that box brains are likely worsening her presentation given traction. Concerned that long standing steroids also contributory.   Will initially screen for anemia (microcytic) and thyroid disorders. If normal, will trial Topical minoxidil Apply 1 mL twice daily. If patient needs further antihypertensive coverage, would trial spironolactone for its antiandrogenic effects.  - F/u CBC, if microcytic -> Iron studies - f/u TSH - consider topical minoxidil trial - consider spironolactone  - Referral to dermatology

## 2022-09-05 NOTE — Assessment & Plan Note (Signed)
DM A1c 1/11 9.0 Medication changes since last visit: Levemir 15units, increased  Humalog 3u WC 1/11 - OV added Ozempic 0.25 mg, started last week. No side effects as of now. Metformin now 1000 mg, no GI side effect.   Patient did not bring glucose meter. She denies hypovolemic or hypoglycemic episodes. No polydipsia, polyuria, or changes in vision. No lethargy. Discussed importance of diabetic control especially in the setting of chronic prednisone use. \  Plan: -Review glucose meter readings, increase meal time if needed -Increase dose of Ozempic and verify increase of Metformin at OV follow up -Inquire about Levemir; patient has enough at home but will need to be switched to Lantus -Urine microalbumin/ Cr at next OV, patient just voided prior to the appointment

## 2022-09-06 ENCOUNTER — Telehealth: Payer: Self-pay

## 2022-09-06 ENCOUNTER — Other Ambulatory Visit: Payer: Self-pay | Admitting: Student

## 2022-09-06 DIAGNOSIS — E119 Type 2 diabetes mellitus without complications: Secondary | ICD-10-CM

## 2022-09-06 NOTE — Telephone Encounter (Signed)
Pt is requesting a call back  she is wanting to know her results

## 2022-09-07 LAB — BMP8+ANION GAP
Anion Gap: 17 mmol/L (ref 10.0–18.0)
BUN/Creatinine Ratio: 30 — ABNORMAL HIGH (ref 9–23)
BUN: 18 mg/dL (ref 6–24)
CO2: 20 mmol/L (ref 20–29)
Calcium: 9 mg/dL (ref 8.7–10.2)
Chloride: 106 mmol/L (ref 96–106)
Creatinine, Ser: 0.6 mg/dL (ref 0.57–1.00)
Glucose: 218 mg/dL — ABNORMAL HIGH (ref 70–99)
Potassium: 4 mmol/L (ref 3.5–5.2)
Sodium: 143 mmol/L (ref 134–144)
eGFR: 107 mL/min/{1.73_m2} (ref >59)

## 2022-09-07 LAB — CBC
Hematocrit: 34.5 % (ref 34.0–46.6)
Hemoglobin: 11.9 g/dL (ref 11.1–15.9)
MCH: 33.9 pg — ABNORMAL HIGH (ref 26.6–33.0)
MCHC: 34.5 g/dL (ref 31.5–35.7)
MCV: 98 fL — ABNORMAL HIGH (ref 79–97)
Platelets: 394 10*3/uL (ref 150–450)
RBC: 3.51 x10E6/uL — ABNORMAL LOW (ref 3.77–5.28)
RDW: 12.8 % (ref 11.7–15.4)
WBC: 10.5 10*3/uL (ref 3.4–10.8)

## 2022-09-07 LAB — IRON,TIBC AND FERRITIN PANEL
Ferritin: 319 ng/mL — ABNORMAL HIGH (ref 15–150)
Iron Saturation: 26 % (ref 15–55)
Iron: 79 ug/dL (ref 27–159)
Total Iron Binding Capacity: 302 ug/dL (ref 250–450)
UIBC: 223 ug/dL (ref 131–425)

## 2022-09-07 NOTE — Progress Notes (Signed)
Internal Medicine Clinic Attending  Case discussed with Dr. Simeon Craft  At the time of the visit.  We reviewed the resident's history and exam and pertinent patient test results.  I agree with the assessment, diagnosis, and plan of care documented in the resident's note.

## 2022-09-07 NOTE — Addendum Note (Signed)
Addended by: Jodean Lima on: 09/07/2022 02:21 PM   Modules accepted: Level of Service

## 2022-09-11 ENCOUNTER — Other Ambulatory Visit: Payer: Self-pay | Admitting: Student

## 2022-09-11 DIAGNOSIS — L299 Pruritus, unspecified: Secondary | ICD-10-CM

## 2022-09-11 MED ORDER — HYDROXYZINE HCL 10 MG PO TABS: 10.0000 mg | ORAL_TABLET | Freq: Four times a day (QID) | ORAL | 0 refills | Status: DC | PRN

## 2022-09-11 NOTE — Telephone Encounter (Signed)
Requesting test results, please call pt back.

## 2022-09-12 ENCOUNTER — Other Ambulatory Visit: Payer: Self-pay | Admitting: Internal Medicine

## 2022-09-12 NOTE — Progress Notes (Signed)
Patient called and aware.

## 2022-09-19 ENCOUNTER — Inpatient Hospital Stay (HOSPITAL_COMMUNITY): Admission: RE | Admit: 2022-09-19 | Payer: 59 | Source: Ambulatory Visit

## 2022-09-22 ENCOUNTER — Other Ambulatory Visit: Payer: Self-pay | Admitting: Student

## 2022-09-22 ENCOUNTER — Other Ambulatory Visit: Payer: Self-pay | Admitting: Internal Medicine

## 2022-09-22 DIAGNOSIS — F32A Depression, unspecified: Secondary | ICD-10-CM

## 2022-09-28 ENCOUNTER — Ambulatory Visit: Payer: 59 | Admitting: Podiatry

## 2022-10-03 ENCOUNTER — Encounter: Payer: 59 | Admitting: Internal Medicine

## 2022-10-12 NOTE — Telephone Encounter (Signed)
Error

## 2022-10-19 ENCOUNTER — Other Ambulatory Visit: Payer: Self-pay | Admitting: Internal Medicine

## 2022-10-19 ENCOUNTER — Ambulatory Visit: Payer: 59 | Admitting: Podiatry

## 2022-10-19 DIAGNOSIS — E119 Type 2 diabetes mellitus without complications: Secondary | ICD-10-CM

## 2022-10-19 DIAGNOSIS — L989 Disorder of the skin and subcutaneous tissue, unspecified: Secondary | ICD-10-CM | POA: Diagnosis not present

## 2022-10-25 ENCOUNTER — Ambulatory Visit (HOSPITAL_BASED_OUTPATIENT_CLINIC_OR_DEPARTMENT_OTHER): Payer: 59 | Admitting: Radiology

## 2022-10-30 ENCOUNTER — Ambulatory Visit (HOSPITAL_BASED_OUTPATIENT_CLINIC_OR_DEPARTMENT_OTHER)
Admission: RE | Admit: 2022-10-30 | Discharge: 2022-10-30 | Disposition: A | Discharge status: Home / Self Care | Payer: 59 | Source: Ambulatory Visit | Attending: Obstetrics | Admitting: Obstetrics

## 2022-10-30 ENCOUNTER — Other Ambulatory Visit: Payer: Self-pay | Admitting: Obstetrics

## 2022-10-30 DIAGNOSIS — R9389 Abnormal findings on diagnostic imaging of other specified body structures: Secondary | ICD-10-CM | POA: Insufficient documentation

## 2022-10-30 DIAGNOSIS — F320 Major depressive disorder, single episode, mild: Secondary | ICD-10-CM

## 2022-10-30 DIAGNOSIS — Z01419 Encounter for gynecological examination (general) (routine) without abnormal findings: Secondary | ICD-10-CM

## 2022-10-30 DIAGNOSIS — R92323 Mammographic fibroglandular density, bilateral breasts: Secondary | ICD-10-CM | POA: Insufficient documentation

## 2022-10-30 DIAGNOSIS — E2839 Other primary ovarian failure: Secondary | ICD-10-CM

## 2022-10-30 DIAGNOSIS — Z1239 Encounter for other screening for malignant neoplasm of breast: Secondary | ICD-10-CM | POA: Insufficient documentation

## 2022-10-30 DIAGNOSIS — N898 Other specified noninflammatory disorders of vagina: Secondary | ICD-10-CM

## 2022-10-30 DIAGNOSIS — E669 Obesity, unspecified: Secondary | ICD-10-CM

## 2022-10-30 DIAGNOSIS — Z1231 Encounter for screening mammogram for malignant neoplasm of breast: Secondary | ICD-10-CM | POA: Insufficient documentation

## 2022-10-30 DIAGNOSIS — N951 Menopausal and female climacteric states: Secondary | ICD-10-CM

## 2022-10-30 DIAGNOSIS — Z113 Encounter for screening for infections with a predominantly sexual mode of transmission: Secondary | ICD-10-CM

## 2022-10-30 DIAGNOSIS — M8588 Other specified disorders of bone density and structure, other site: Secondary | ICD-10-CM | POA: Diagnosis not present

## 2022-10-30 DIAGNOSIS — Z1211 Encounter for screening for malignant neoplasm of colon: Secondary | ICD-10-CM

## 2022-10-31 ENCOUNTER — Telehealth: Payer: Self-pay | Admitting: Sports Medicine

## 2022-10-31 ENCOUNTER — Ambulatory Visit (INDEPENDENT_AMBULATORY_CARE_PROVIDER_SITE_OTHER): Payer: 59 | Admitting: Sports Medicine

## 2022-10-31 ENCOUNTER — Encounter: Payer: Self-pay | Admitting: Sports Medicine

## 2022-10-31 DIAGNOSIS — G8929 Other chronic pain: Secondary | ICD-10-CM

## 2022-10-31 DIAGNOSIS — M545 Low back pain, unspecified: Secondary | ICD-10-CM

## 2022-10-31 DIAGNOSIS — M170 Bilateral primary osteoarthritis of knee: Secondary | ICD-10-CM

## 2022-10-31 DIAGNOSIS — M4722 Other spondylosis with radiculopathy, cervical region: Secondary | ICD-10-CM | POA: Diagnosis not present

## 2022-10-31 DIAGNOSIS — M542 Cervicalgia: Secondary | ICD-10-CM

## 2022-10-31 DIAGNOSIS — M1612 Unilateral primary osteoarthritis, left hip: Secondary | ICD-10-CM

## 2022-10-31 MED ORDER — OXYCODONE HCL 5 MG PO TABS: 5.0000 mg | ORAL_TABLET | Freq: Four times a day (QID) | ORAL | 0 refills | Status: DC | PRN

## 2022-10-31 MED ORDER — OXYCODONE HCL 5 MG PO CAPS: 5.0000 mg | ORAL_CAPSULE | Freq: Four times a day (QID) | ORAL | 0 refills | Status: DC | PRN

## 2022-10-31 NOTE — Addendum Note (Signed)
Addended by: Renne Musca III on: 10/31/2022 04:49 PM   Modules accepted: Orders

## 2022-10-31 NOTE — Progress Notes (Signed)
Hannah Young - 57 y.o. female MRN [id number]  Date of birth: 08-Oct-1965  Office Visit Note: Visit Date: 10/31/2022 PCP: Romana Juniper, MD Referred by: Romana Juniper  Subjective: Chief Complaint  Patient presents with   Lower Back - Pain   HPI: Hannah Young is a pleasant 57 y.o. female who presents today for chronic left hip pain and OA, back pain, and bilateral knee pain.  Patient has a history of moderate to severe left hip osteoarthritis.  She has been following Dr. Erlinda Hong for this.  Did perform ultrasound-guided left hip injection back on 06/01/2022.  She got good relief for 1 week, then her pain returned.  She was interested in surgery at that time but was not optimized medically or socially.  Bilateral knee pain, left greater than right.  Has near bone-on-bone arthritic change of the medial compartment of bilateral knees.  Has been seen Dr. Erlinda Hong for this.  Has taken oxycodone in the past for pain control, however has not been on anything in the last few months.  Tramadol makes her feel very loopy.  Low back pain -pain is localized to the middle of the low back.  Has some pain with certain motions.  Denies any numbness or tingling going down the legs.  Was seen previously by Benjiman Core on 05/25/2022, she was instructed to follow-up with Dr. Laurance Flatten, however was unable to make appointment.  Has not been doing any formal physical therapy. Does not have time for this right now.  Lab Results  Component Value Date   HGBA1C 9.0 (A) 08/24/2022   Patient states she has a lot of social stressors at home.  She cares for someone in her home on hospice.  She has children that are incarcerated, there is a lot of stress currently. Does not feel she is in a place to have surgery right now.  Pertinent ROS were reviewed with the patient and found to be negative unless otherwise specified above in HPI.   Assessment & Plan: Visit Diagnoses:  1. Chronic neck pain   2. Chronic  midline low back pain without sciatica   3. Bilateral primary osteoarthritis of knee   4. Primary osteoarthritis of left hip   5. Other spondylosis with radiculopathy, cervical region    Plan: Discussed with Hannah Young the nature of her orthopedic conditions.  She does have rather advanced bilateral knee osteoarthritis as well as hip arthritis.  She has had discussion on possible surgical replacement, although given her uncontrolled diabetes and her social stressors currently mild, she is not at a place for surgical operation.  She is planning on following up with Dr. Erlinda Hong for her knees and later this week.  She was previously supposed to see Dr. Laurance Flatten for her lumbar and cervical spine, we will get her scheduled to see him at her leisure.  They discussed that she would likely be a candidate for formalized physical therapy, although currently is not interested nor has the time for this.  Hannah Young today states that the only thing that helps her is pain medicine, we discussed today that we do not prescribe chronic narcotics.  We discussed that I would send a referral to pain management for her, she is agreeable to this. I will prescribe a one-time supply of oxycodone only to bridge the gap for her to get into pain management. She is aware I cannot refill this for her and further pain medicine will need to come from pain management if they feel she  is a candidate. She understands this.   Follow-up: Return for make appt with Dr. Laurance Flatten for low back and cervical pain.   Meds & Orders: No orders of the defined types were placed in this encounter.   Orders Placed This Encounter  Procedures   Ambulatory referral to Pain Clinic     Procedures: No procedures performed      Clinical History: No specialty comments available.  She reports that she quit smoking about 2 years ago. Her smoking use included cigarettes. She smoked an average of .3 packs per day. She has never used smokeless tobacco.  Recent Labs     05/25/22 1110 08/24/22 0926  HGBA1C  --  9.0*  LABURIC 4.5  --     Objective:    Physical Exam  Gen: Well-appearing, in no acute distress; non-toxic CV: Well-perfused. Warm.  Resp: Breathing unlabored on room air; no wheezing. Psych: Fluid speech in conversation; appropriate affect; normal thought process Neuro: Sensation intact throughout. No gross coordination deficits.   Ortho Exam - Lumbar: There is positive TTP in the surrounding musculature near the L5 region.  No spinous process TTP.  There is full range of motion with flexion and extension, although extension does likely worsen the pain.  Negative modified slump's test.  5/5 strength of bilateral lower extremities.  - Hip/Knees: There is restriction with internal rotation of the left hip.  Positive FADIR test.  There is some crepitus of bilateral knees.   Imaging:  *Independent review and interpretation of 2 views of the lumbar spine from 05/25/2022 were reviewed by myself.  X-rays demonstrate AP and lateral film, there is no significant scoliosis.  There is mild intervertebral disc space narrowing between the L5 and S1 region with some spondylosis.  There is no anterior or retrograde listhesis.  Mild facet arthropathy at L5, otherwise no acute fracture or other bony abnormality noted.  Narrative & Impression  CLINICAL DATA:  Chronic neck pain.   EXAM: MRI CERVICAL SPINE WITHOUT CONTRAST   TECHNIQUE: Multiplanar, multisequence MR imaging of the cervical spine was performed. No intravenous contrast was administered.   COMPARISON:  Cervical spine MRI 05/08/2020   FINDINGS: Alignment: There is straightening of the normal cervical lordosis. Trace retrolisthesis of C5 on C6, unchanged from prior exam.   Vertebrae: No fracture, evidence of discitis, or bone lesion.   Cord: Normal signal and morphology.   Posterior Fossa, vertebral arteries, paraspinal tissues: Negative.   Disc levels:   C1-C2: Mild degenerative  change.   C2-C3: Minimal disc bulge. No significant spinal canal stenosis. Mild bilateral facet degenerative change. Uncovertebral hypertrophy on the right. Moderate to severe right neural foraminal narrowing. This is unchanged from prior exam.   C3-C4: Minimal disc bulge. Mild bilateral facet degenerative change. No spinal canal stenosis. Mild bilateral neural foraminal stenosis.   C4-C5: Compared to prior exam there is a new right paracentral disc protrusion that contacts and deforms the ventral spinal cord. Mild overall spinal canal narrowing. There is mild-to-moderate bilateral facet degenerative change. Uncovertebral hypertrophy. Moderate bilateral neural foraminal narrowing. Findings are unchanged from prior exam.   C5-C6: Moderate bilateral facet degenerative change. Small central disc protrusion. Mild overall spinal canal narrowing. Uncovertebral hypertrophy. Moderate bilateral neural foraminal narrowing. These findings are unchanged from prior exam. Findings are unchanged from prior exam.   C6-C7: Moderate bilateral facet degenerative change. No significant disc bulge. No spinal canal stenosis. Uncovertebral hypertrophy. Moderate right neural foraminal narrowing.   C7-T1: Mild bilateral facet degenerative change. No  significant disc bulge. Uncovertebral hypertrophy on the left. No significant spinal canal stenosis. No neural foraminal stenosis.   IMPRESSION: 1. New right paracentral disc protrusion at C4-C5 that contacts and deforms the ventral spinal cord and results in mild overall spinal canal narrowing. 2. Otherwise unchanged multilevel degenerative changes of the cervical spine, as above.     Electronically Signed   By: Marin Roberts M.D.   On: 08/01/2022 06:20    Left knee XR on 02/02/22: Moderately advanced tricompartmental DJD with mild varus alignment.   Periarticular spurring.  Nearly bone-on-bone joint space narrowing.   Narrative & Impression   CLINICAL DATA:  Pain.   EXAM: DG HIP (WITH OR WITHOUT PELVIS) 2-3V LEFT   COMPARISON:  None.   FINDINGS: No signs of acute fracture or dislocation. Moderate bilateral hip osteoarthritis. Degenerative changes noted at the pubic symphysis.   IMPRESSION: 1. No acute findings. 2. Moderate bilateral hip osteoarthritis.     Electronically Signed   By: Kerby Moors M.D.   On: 01/04/2022 09:46    Past Medical/Family/Surgical/Social History: Medications & Allergies reviewed per EMR, new medications updated. Patient Active Problem List   Diagnosis Date Noted   HLD (hyperlipidemia) 08/26/2022   Primary osteoarthritis of left hip 02/02/2022   Primary osteoarthritis of left knee 02/02/2022   Mold exposure 09/06/2021   Alopecia 10/21/2020   Chronic right shoulder pain 06/02/2020   Bilateral occipital neuralgia 05/01/2020   Chronic neck pain 05/01/2020   Arthralgia 03/29/2020   Osteoarthritis 02/12/2020   Healthcare maintenance 04/29/2018   Multiple lung nodules on CT 01/25/2017   Depression 01/25/2017   Autoimmune hepatitis ([place]) 01/30/2012   Diabetes mellitus ([place]) 01/30/2012   Asthma 01/30/2012   Hypertension 01/30/2012   Past Medical History:  Diagnosis Date   Anxiety    Arthritis    Asthma    Autoimmune hepatitis ([place])    Bipolar 1 disorder ([place])    Breast lump    Depression    Diabetes mellitus    FH: chemotherapy    Gout    Hair loss 10/21/2020   Headache 02/01/2021   High cholesterol    Housing situation unstable 03/22/2021   Hypertension    Liver cirrhosis ([facility])    Lung nodules    Neuropathy    Panic disorder    STD exposure 02/23/2021   Vaginal discharge 01/25/2017   Vertigo    Family History  Problem Relation Age of Onset   Liver disease Mother    Kidney failure Father    Heart disease Sister    Liver disease Daughter    Anxiety disorder Daughter    Heart disease Son    Colon cancer Neg Hx    Stomach cancer Neg Hx    Migraines Neg Hx     Headache Neg Hx    Past Surgical History:  Procedure Laterality Date   Biopsy of liver     CESAREAN SECTION     LAPAROSCOPY     TUBAL LIGATION     Social History   Occupational History   Not on file  Tobacco Use   Smoking status: Former    Packs/day: .3    Types: Cigarettes    Quit date: 08/22/2020    Years since quitting: 2.1   Smokeless tobacco: Never   Tobacco comments:    2-3 cigs/day  Vaping Use   Vaping Use: Never used  Substance and Sexual Activity   Alcohol use: Not Currently    Alcohol/week: 3.0 standard  drinks of alcohol    Types: 3 Cans of beer per week   Drug use: No   Sexual activity: Not Currently    Birth control/protection: Surgical    Comment: States she is not currently active

## 2022-10-31 NOTE — Telephone Encounter (Signed)
[facility] called requesting Dr Rolena Infante to resend pt oxycodone to tablets and not capsules. Pt insurance will not pay for capsules. Pharmacy phone number is [phone]

## 2022-10-31 NOTE — Progress Notes (Signed)
Patient states she is here primarily for pain medicine States she is in a lot of pain  Denies any OTC medication currently   Is not doing any type of exercises/ PT

## 2022-11-01 ENCOUNTER — Ambulatory Visit (INDEPENDENT_AMBULATORY_CARE_PROVIDER_SITE_OTHER): Payer: 59 | Admitting: Orthopaedic Surgery

## 2022-11-01 ENCOUNTER — Encounter: Payer: Self-pay | Admitting: Orthopaedic Surgery

## 2022-11-01 ENCOUNTER — Other Ambulatory Visit: Payer: Self-pay | Admitting: Internal Medicine

## 2022-11-01 DIAGNOSIS — Z794 Long term (current) use of insulin: Secondary | ICD-10-CM

## 2022-11-01 DIAGNOSIS — J4520 Mild intermittent asthma, uncomplicated: Secondary | ICD-10-CM

## 2022-11-01 DIAGNOSIS — E669 Obesity, unspecified: Secondary | ICD-10-CM

## 2022-11-01 DIAGNOSIS — M170 Bilateral primary osteoarthritis of knee: Secondary | ICD-10-CM | POA: Diagnosis not present

## 2022-11-01 NOTE — Progress Notes (Signed)
Office Visit Note   Patient: Hannah Young           Date of Birth: 1965/10/31           MRN: [id number] Visit Date: 11/01/2022              Requested by: Romana Juniper, MD [address] [place],  [place] [address] PCP: Romana Juniper, MD   Assessment & Plan: Visit Diagnoses:  1. Bilateral primary osteoarthritis of knee     Plan: Impression is bilateral knee osteoarthritis.  Patient is requesting knee braces today.  We have also discussed intra-articular cortisone injections however her most recent hemoglobin A1c was 9.0 and I do not feel that we should proceed with these injections until her A1c is lower.  She understands and agrees.  Will provide her with bilateral knee OA reaction braces.  She will follow-up with [place] as needed.  Follow-Up Instructions: Return if symptoms worsen or fail to improve.   Orders:  No orders of the defined types were placed in this encounter.  No orders of the defined types were placed in this encounter.     Procedures: No procedures performed   Clinical Data: No additional findings.   Subjective: Chief Complaint  Patient presents with   Left Knee - Pain   Right Knee - Pain    HPI patient is a pleasant 57 year old female comes in today with bilateral knee pain left greater than right.  History of osteoarthritis.  The pain she has is to the entire aspect of both knees and is described as a constant throb.  Symptoms appear to be worse when she goes from a seated to standing position as well as when she is walking.  She is taking pain medication which she was provided yesterday.  She has previously undergone cortisone injections which have helped in the past.  Review of Systems as detailed in HPI.  All others reviewed and are negative.   Objective: Vital Signs: There were no vitals taken for this visit.  Physical Exam well-developed well-nourished female in no acute distress.  Alert and oriented x 3.  Ortho Exam left knee  exam shows range of motion of 0 to 110 degrees.  Medial and lateral joint line tenderness.  Mild patellofemoral crepitus.  She is neurovascular intact distally.  Right knee exam shows no joint line tenderness.  Moderate patellofemoral crepitus.  She is neurovascular intact distally.  Specialty Comments:  No specialty comments available.  Imaging: No new imaging   PMFS History: Patient Active Problem List   Diagnosis Date Noted   HLD (hyperlipidemia) 08/26/2022   Primary osteoarthritis of left hip 02/02/2022   Primary osteoarthritis of left knee 02/02/2022   Mold exposure 09/06/2021   Alopecia 10/21/2020   Chronic right shoulder pain 06/02/2020   Bilateral occipital neuralgia 05/01/2020   Chronic neck pain 05/01/2020   Arthralgia 03/29/2020   Osteoarthritis 02/12/2020   Healthcare maintenance 04/29/2018   Multiple lung nodules on CT 01/25/2017   Depression 01/25/2017   Autoimmune hepatitis ([place]) 01/30/2012   Diabetes mellitus ([place]) 01/30/2012   Asthma 01/30/2012   Hypertension 01/30/2012   Past Medical History:  Diagnosis Date   Anxiety    Arthritis    Asthma    Autoimmune hepatitis ([place])    Bipolar 1 disorder ([place])    Breast lump    Depression    Diabetes mellitus    FH: chemotherapy    Gout    Hair loss 10/21/2020  Headache 02/01/2021   High cholesterol    Housing situation unstable 03/22/2021   Hypertension    Liver cirrhosis ([facility])    Lung nodules    Neuropathy    Panic disorder    STD exposure 02/23/2021   Vaginal discharge 01/25/2017   Vertigo     Family History  Problem Relation Age of Onset   Liver disease Mother    Kidney failure Father    Heart disease Sister    Liver disease Daughter    Anxiety disorder Daughter    Heart disease Son    Colon cancer Neg Hx    Stomach cancer Neg Hx    Migraines Neg Hx    Headache Neg Hx     Past Surgical History:  Procedure Laterality Date   Biopsy of liver     CESAREAN SECTION     LAPAROSCOPY     TUBAL  LIGATION     Social History   Occupational History   Not on file  Tobacco Use   Smoking status: Former    Packs/day: .3    Types: Cigarettes    Quit date: 08/22/2020    Years since quitting: 2.1   Smokeless tobacco: Never   Tobacco comments:    2-3 cigs/day  Vaping Use   Vaping Use: Never used  Substance and Sexual Activity   Alcohol use: Not Currently    Alcohol/week: 3.0 standard drinks of alcohol    Types: 3 Cans of beer per week   Drug use: No   Sexual activity: Not Currently    Birth control/protection: Surgical    Comment: States she is not currently active

## 2022-11-01 NOTE — Telephone Encounter (Signed)
Next appt scheduled 3/27 with Dr Lisabeth Devoid.

## 2022-11-02 ENCOUNTER — Other Ambulatory Visit (HOSPITAL_BASED_OUTPATIENT_CLINIC_OR_DEPARTMENT_OTHER): Payer: 59

## 2022-11-02 ENCOUNTER — Other Ambulatory Visit: Payer: 59

## 2022-11-02 ENCOUNTER — Ambulatory Visit: Payer: 59 | Admitting: Obstetrics and Gynecology

## 2022-11-08 ENCOUNTER — Encounter: Payer: 59 | Admitting: Student

## 2022-11-09 ENCOUNTER — Ambulatory Visit (INDEPENDENT_AMBULATORY_CARE_PROVIDER_SITE_OTHER): Payer: 59 | Admitting: Obstetrics and Gynecology

## 2022-11-09 ENCOUNTER — Encounter: Payer: Self-pay | Admitting: Obstetrics and Gynecology

## 2022-11-09 VITALS — BP 113/80 | HR 89 | Ht 61.0 in | Wt 189.0 lb

## 2022-11-09 DIAGNOSIS — N950 Postmenopausal bleeding: Secondary | ICD-10-CM | POA: Diagnosis not present

## 2022-11-09 DIAGNOSIS — R9389 Abnormal findings on diagnostic imaging of other specified body structures: Secondary | ICD-10-CM | POA: Diagnosis not present

## 2022-11-09 NOTE — Progress Notes (Signed)
57 y.o. GYN presents for consultation. Pt has an endometrial mass.

## 2022-11-09 NOTE — Progress Notes (Signed)
Ms Lebleu presents in referral from Dr Jodi Mourning due to PMB and thicken endometrium on U/S. U/S results reviewed with pt and indications for hysteroscopy discussed.  Pt with uncontrolled DM.  PE AF VSS Lungs clear Heart RRR Abd soft + BS  A/P PMB        Thicken endometrium on U/S Information provided. Will obtain pre op clearance. Schedule surgery once obtained. F/U with post op appt

## 2022-11-13 ENCOUNTER — Telehealth: Payer: Self-pay | Admitting: Orthopedic Surgery

## 2022-11-13 ENCOUNTER — Other Ambulatory Visit: Payer: Self-pay | Admitting: Physician Assistant

## 2022-11-13 MED ORDER — CYCLOBENZAPRINE HCL 10 MG PO TABS: 10.0000 mg | ORAL_TABLET | Freq: Two times a day (BID) | ORAL | 0 refills | Status: DC | PRN

## 2022-11-13 MED ORDER — METHYLPREDNISOLONE 4 MG PO TBPK: ORAL_TABLET | ORAL | 0 refills | Status: DC

## 2022-11-13 NOTE — Telephone Encounter (Signed)
Called patient. She is not happy with the medication prescribed because she wants something stronger. Explained that unless she is a post surgical patient, Dr.Xu or Mendel Ryder are not prescribing oxycodone. She said that she might just call 911 to come get her and take her to the hospital.

## 2022-11-13 NOTE — Telephone Encounter (Signed)
Looks like she cannot take tylenol so no tylenol 3 or norco.  Oxy too strong. I have sent in a medrol dosepak and muscle relaxer

## 2022-11-13 NOTE — Telephone Encounter (Signed)
Patient called asked if she can get something for pain. Patient said she can not take Tramadol.  Patient said she is in a lot of pain. The number to contact patient is [phone]

## 2022-11-16 ENCOUNTER — Ambulatory Visit: Payer: 59 | Admitting: Orthopedic Surgery

## 2022-11-16 ENCOUNTER — Other Ambulatory Visit: Payer: Self-pay | Admitting: Student

## 2022-11-16 DIAGNOSIS — F32A Depression, unspecified: Secondary | ICD-10-CM

## 2022-11-21 ENCOUNTER — Other Ambulatory Visit: Payer: Self-pay | Admitting: Internal Medicine

## 2022-11-21 ENCOUNTER — Other Ambulatory Visit: Payer: Self-pay | Admitting: Student

## 2022-11-21 DIAGNOSIS — F32A Depression, unspecified: Secondary | ICD-10-CM

## 2022-11-21 DIAGNOSIS — I10 Essential (primary) hypertension: Secondary | ICD-10-CM

## 2022-11-27 DIAGNOSIS — L810 Postinflammatory hyperpigmentation: Secondary | ICD-10-CM | POA: Diagnosis not present

## 2022-11-27 DIAGNOSIS — L989 Disorder of the skin and subcutaneous tissue, unspecified: Secondary | ICD-10-CM | POA: Diagnosis not present

## 2022-11-30 ENCOUNTER — Ambulatory Visit: Payer: 59 | Admitting: Orthopedic Surgery

## 2022-12-01 ENCOUNTER — Ambulatory Visit: Payer: 59 | Admitting: Obstetrics

## 2022-12-04 ENCOUNTER — Encounter: Payer: 59 | Admitting: Student

## 2022-12-07 ENCOUNTER — Other Ambulatory Visit: Payer: Self-pay | Admitting: Student

## 2022-12-07 DIAGNOSIS — E119 Type 2 diabetes mellitus without complications: Secondary | ICD-10-CM

## 2022-12-11 ENCOUNTER — Other Ambulatory Visit: Payer: Self-pay | Admitting: Student

## 2022-12-11 DIAGNOSIS — L810 Postinflammatory hyperpigmentation: Secondary | ICD-10-CM | POA: Diagnosis not present

## 2022-12-11 DIAGNOSIS — L905 Scar conditions and fibrosis of skin: Secondary | ICD-10-CM | POA: Diagnosis not present

## 2022-12-18 ENCOUNTER — Telehealth: Payer: Self-pay | Admitting: Physical Medicine and Rehabilitation

## 2022-12-18 NOTE — Telephone Encounter (Addendum)
Patient called needing to schedule an appointment with with Dr. Alvester Morin for her hips. The number to contact patient is [phone]

## 2022-12-26 ENCOUNTER — Telehealth: Payer: Self-pay | Admitting: Orthopaedic Surgery

## 2022-12-26 ENCOUNTER — Other Ambulatory Visit: Payer: Self-pay | Admitting: Sports Medicine

## 2022-12-26 ENCOUNTER — Encounter: Payer: Self-pay | Admitting: Sports Medicine

## 2022-12-26 DIAGNOSIS — M170 Bilateral primary osteoarthritis of knee: Secondary | ICD-10-CM

## 2022-12-26 MED ORDER — OXYCODONE HCL 5 MG PO TABS: 5.0000 mg | ORAL_TABLET | Freq: Four times a day (QID) | ORAL | 0 refills | Status: AC | PRN

## 2022-12-26 NOTE — Telephone Encounter (Signed)
Patient is stating she is seeing DR. Anselm Lis and [facility], she is waiting on a pain management referral and has not gotten anything yet . She is asking for either pain medication or someone to call her ASAP.

## 2022-12-26 NOTE — Progress Notes (Signed)
Patient called, upon chart review does appear that her prior referral to pain management was not accepted.  We did place a new referral to [facility] medical pain management clinic.  To help bridge the gap until she is able to get her appointment, will send in a one-time refill of oxycodone 5 mg, 20 tablets to take for breakthrough/severe pain only.  My medical assistant, Lequita Halt did explain we cannot refill this and she would need to get further pain medication from [facility] medical pain management.  Did send in 20 tablets, will not refill.  She will continue her routine follow-up with Dr. Roda Shutters.   Madelyn Brunner, DO Primary Care Sports Medicine Physician  [facility] - [facility]  This note was dictated using Dragon naturally speaking software and may contain errors in syntax, spelling, or content which have not been identified prior to signing this note.

## 2022-12-30 ENCOUNTER — Ambulatory Visit (HOSPITAL_COMMUNITY)
Admission: EM | Admit: 2022-12-30 | Discharge: 2022-12-30 | Disposition: A | Discharge status: Home / Self Care | Payer: 59 | Attending: Urgent Care | Admitting: Urgent Care

## 2022-12-30 ENCOUNTER — Encounter (HOSPITAL_COMMUNITY): Payer: Self-pay

## 2022-12-30 VITALS — BP 114/77 | HR 88 | Temp 98.0°F | Resp 20 | Ht 61.0 in | Wt 191.0 lb

## 2022-12-30 DIAGNOSIS — Z113 Encounter for screening for infections with a predominantly sexual mode of transmission: Secondary | ICD-10-CM | POA: Insufficient documentation

## 2022-12-30 DIAGNOSIS — R8290 Unspecified abnormal findings in urine: Secondary | ICD-10-CM | POA: Diagnosis not present

## 2022-12-30 DIAGNOSIS — R81 Glycosuria: Secondary | ICD-10-CM | POA: Diagnosis not present

## 2022-12-30 DIAGNOSIS — N760 Acute vaginitis: Secondary | ICD-10-CM | POA: Diagnosis present

## 2022-12-30 LAB — POCT URINALYSIS DIP (MANUAL ENTRY)
Bilirubin, UA: NEGATIVE
Blood, UA: NEGATIVE
Glucose, UA: 100 mg/dL — AB
Ketones, POC UA: NEGATIVE mg/dL
Leukocytes, UA: NEGATIVE
Nitrite, UA: NEGATIVE
Protein Ur, POC: NEGATIVE mg/dL
Spec Grav, UA: 1.025 (ref 1.010–1.025)
Urobilinogen, UA: 4 E.U./dL — AB
pH, UA: 6 (ref 5.0–8.0)

## 2022-12-30 LAB — HIV ANTIBODY (ROUTINE TESTING W REFLEX): HIV Screen 4th Generation wRfx: NONREACTIVE

## 2022-12-30 NOTE — ED Triage Notes (Signed)
Patient here today with multiple complaints. She is having some left hip pain X 8 months and was told that she would need a THA but she can't because her A1C has been too high. She also has bilat knee pain and was told that she would need to have those replaced as well. She goes to [facility]. She has an appointment with Pain management.   She is also c/o fatigue and talking to someone and all of a sudden fall asleep.   Also c/o of headaches and hair loss. She goes to [facility] and has had a biopsy done on her scalp.  \ She would also like to to be tested for STDs.

## 2022-12-30 NOTE — ED Provider Notes (Signed)
[facility]    CSN: [id number] Arrival date & time: 12/30/22  1044      History   Chief Complaint Chief Complaint  Patient presents with   Hip Pain    HPI Hannah Young is a 57 y.o. female.   57yo female presents today with the primary concern of pelvic discomfort, vaginal discharge and itching. She does not give a detailed hx as her grandson is in the room. She is requesting a full STD workup. She denies dysuria or hematuria. No fever of flank pain. Pt originally had discussed numerous other complaints during intake, but states these are being managed by other specialists. Follows with derm for hair thinning and recently had bx performed. Has chronic left hip pain and knee pain, was told they needed to be replaced. Takes celebrex for these. No change to her chronic sx. Has been dealing with intermittent headaches. Believes there is mold in her home causing this. This is being evaluated by her PCP.      Past Medical History:  Diagnosis Date   Anxiety    Arthritis    Asthma    Autoimmune hepatitis (HCC)    Bipolar 1 disorder (HCC)    Breast lump    Depression    Diabetes mellitus    FH: chemotherapy    Gout    Hair loss 10/21/2020   Headache 02/01/2021   High cholesterol    Housing situation unstable 03/22/2021   Hypertension    Liver cirrhosis (HCC)    Lung nodules    Neuropathy    Panic disorder    STD exposure 02/23/2021   Vaginal discharge 01/25/2017   Vertigo     Patient Active Problem List   Diagnosis Date Noted   PMB (postmenopausal bleeding) 11/09/2022   Endometrial thickening on ultrasound 11/09/2022   HLD (hyperlipidemia) 08/26/2022   Primary osteoarthritis of left hip 02/02/2022   Primary osteoarthritis of left knee 02/02/2022   Mold exposure 09/06/2021   Alopecia 10/21/2020   Chronic right shoulder pain 06/02/2020   Bilateral occipital neuralgia 05/01/2020   Chronic neck pain 05/01/2020   Arthralgia 03/29/2020   Osteoarthritis  02/12/2020   Healthcare maintenance 04/29/2018   Multiple lung nodules on CT 01/25/2017   Depression 01/25/2017   Autoimmune hepatitis (HCC) 01/30/2012   Diabetes mellitus (HCC) 01/30/2012   Asthma 01/30/2012   Hypertension 01/30/2012    Past Surgical History:  Procedure Laterality Date   Biopsy of liver     CESAREAN SECTION     LAPAROSCOPY     TUBAL LIGATION      OB History     Gravida  6   Para  4   Term  0   Preterm  4   AB  2   Living  4      SAB  2   IAB  0   Ectopic      Multiple  0   Live Births  3            Home Medications    Prior to Admission medications   Medication Sig Start Date End Date Taking? Authorizing Provider  Accu-Chek Softclix Lancets lancets Use as instructed to check blood sugar 3x a day 08/25/22   Morene Crocker, MD  albuterol (VENTOLIN HFA) 108 (90 Base) MCG/ACT inhaler INHALE ONE TO TWO puffs into THE lungs EVERY SIX HOURS AS NEEDED SHORTNESS OF BREATH 11/01/22   Morene Crocker, MD  blood glucose meter kit and supplies Dispense  based on patient and insurance preference. Use up to four times daily as directed. (FOR ICD-10 E10.9, E11.9). 08/25/22   Gwenevere Abbot, MD  Blood Glucose Monitoring Suppl (ACCU-CHEK GUIDE ME) w/Device KIT Use 3 times per day to check your blood sugar. 08/25/22   Morene Crocker, MD  celecoxib (CELEBREX) 200 MG capsule Take 1 capsule (200 mg total) by mouth 2 (two) times daily. 02/02/22   Tarry Kos, MD  cyclobenzaprine (FLEXERIL) 10 MG tablet Take 1 tablet (10 mg total) by mouth 2 (two) times daily as needed for muscle spasms. 11/13/22   Cristie Hem, PA-C  fluticasone (FLONASE) 50 MCG/ACT nasal spray Place 2 sprays into both nostrils daily as needed for allergies or rhinitis.     [provider]  glucose blood (ACCU-CHEK GUIDE) test strip Use as instructed to check blood sugar 3 times a day 08/25/22   Morene Crocker, MD  hydrOXYzine (ATARAX) 10 MG tablet Take 1  tablet (10 mg total) by mouth every 6 (six) hours as needed. 09/11/22   Morene Crocker, MD  insulin lispro (HUMALOG) 100 UNIT/ML injection inject THREE units into THE SKIN THREE TIMES DAILY 12/20/22   Quincy Simmonds, MD  Insulin Lispro w/ Trans Port (HUMALOG TEMPO PEN) 100 UNIT/ML SOPN Inject 3 Units into the skin 3 (three) times daily before meals. 08/24/22   Gwenevere Abbot, MD  LANTUS SOLOSTAR 100 UNIT/ML Solostar Pen Inject 15 Units into the skin daily. 12/08/22   Morene Crocker, MD  lisinopril (ZESTRIL) 10 MG tablet TAKE ONE TABLET BY MOUTH EVERY DAY 11/21/22   Morene Crocker, MD  mercaptopurine (PURINETHOL) 50 MG tablet Take 50 mg by mouth daily. Give on an empty stomach 1 hour before or 2 hours after meals. Caution: Chemotherapy.    [provider]  metFORMIN (GLUCOPHAGE-XR) 500 MG 24 hr tablet Take 1 tablet (500 mg total) by mouth daily with breakfast for 7 days, THEN 1 tablet (500 mg total) 2 (two) times daily with a meal for 7 days, THEN 1 tablet (500 mg total) 3 (three) times daily for 7 days, THEN 2 tablets (1,000 mg total) 2 (two) times daily with a meal for 7 days. 11/01/22 11/28/22  Morene Crocker, MD  omeprazole (PRILOSEC) 40 MG capsule Take 40 mg by mouth daily. 06/28/20   [provider]  oxyCODONE (OXY IR/ROXICODONE) 5 MG immediate release tablet Take 1 tablet (5 mg total) by mouth every 6 (six) hours as needed for severe pain or breakthrough pain. 12/26/22   Madelyn Brunner, DO  OZEMPIC, 0.25 OR 0.5 MG/DOSE, 2 MG/3ML SOPN Inject 0.25 mg into the skin once a week for 28 days. 11/01/22   Morene Crocker, MD  predniSONE (DELTASONE) 20 MG tablet Take 40 mg by mouth daily with breakfast.    [provider]  rosuvastatin (CRESTOR) 20 MG tablet Take 1 tablet (20 mg total) by mouth daily. 08/25/22 08/25/23  Gwenevere Abbot, MD  SURE COMFORT PEN NEEDLES 32G X 6 MM MISC USE ONE needle in THE morning, NOON, evening, AND AT night - as directed. 10/22/22    Morene Crocker, MD  triamcinolone cream (KENALOG) 0.1 % Apply 1 application topically 2 (two) times daily. 04/03/21   Ivette Loyal, NP  venlafaxine XR (EFFEXOR-XR) 37.5 MG 24 hr capsule TAKE ONE CAPSULE BY MOUTH DAILY 11/17/22   Quincy Simmonds, MD    Family History Family History  Problem Relation Age of Onset   Liver disease Mother    Kidney failure Father  Heart disease Sister    Liver disease Daughter    Anxiety disorder Daughter    Heart disease Son    Colon cancer Neg Hx    Stomach cancer Neg Hx    Migraines Neg Hx    Headache Neg Hx     Social History Social History   Tobacco Use   Smoking status: Former    Packs/day: .3    Types: Cigarettes    Quit date: 08/22/2020    Years since quitting: 2.3   Smokeless tobacco: Never   Tobacco comments:    2-3 cigs/day  Vaping Use   Vaping Use: Never used  Substance Use Topics   Alcohol use: Not Currently    Alcohol/week: 3.0 standard drinks of alcohol    Types: 3 Cans of beer per week   Drug use: No     Allergies   Amoxicillin, Augmentin [amoxicillin-pot clavulanate], Tylenol [acetaminophen], and Latex   Review of Systems Review of Systems As per HPI  Physical Exam Triage Vital Signs ED Triage Vitals [12/30/22 1156]  Enc Vitals Group     BP 114/77     Pulse Rate 88     Resp 20     Temp 98 F (36.7 C)     Temp Source Oral     SpO2 95 %     Weight 191 lb (86.6 kg)     Height 5\' 1"  (1.549 m)     Head Circumference      Peak Flow      Pain Score 8     Pain Loc      Pain Edu?      Excl. in GC?    No data found.  Updated Vital Signs BP 114/77 (BP Location: Right Arm)   Pulse 88   Temp 98 F (36.7 C) (Oral)   Resp 20   Ht 5\' 1"  (1.549 m)   Wt 191 lb (86.6 kg)   SpO2 95%   BMI 36.09 kg/m   Visual Acuity Right Eye Distance:   Left Eye Distance:   Bilateral Distance:    Right Eye Near:   Left Eye Near:    Bilateral Near:     Physical Exam Vitals and nursing note reviewed.   Constitutional:      General: She is not in acute distress.    Appearance: Normal appearance. She is well-developed. She is obese. She is not ill-appearing or toxic-appearing.  HENT:     Head: Normocephalic and atraumatic.  Eyes:     Conjunctiva/sclera: Conjunctivae normal.  Cardiovascular:     Rate and Rhythm: Normal rate.  Pulmonary:     Effort: Pulmonary effort is normal. No respiratory distress.  Abdominal:     General: Abdomen is flat.     Palpations: Abdomen is soft.     Tenderness: There is no abdominal tenderness. There is no right CVA tenderness, left CVA tenderness, guarding or rebound.  Musculoskeletal:        General: No swelling.     Cervical back: Neck supple.  Skin:    General: Skin is warm and dry.     Findings: No erythema or rash.     Comments: Hair thinning primarily to medial scalp  Neurological:     Mental Status: She is alert.  Psychiatric:        Mood and Affect: Mood normal.      UC Treatments / Results  Labs (all labs ordered are listed, but only abnormal results are displayed)  Labs Reviewed  POCT URINALYSIS DIP (MANUAL ENTRY) - Abnormal; Notable for the following components:      Result Value   Glucose, UA =100 (*)    Urobilinogen, UA 4.0 (*)    All other components within normal limits  HIV ANTIBODY (ROUTINE TESTING W REFLEX)  RPR  CERVICOVAGINAL ANCILLARY ONLY    EKG   Radiology No results found.  Procedures Procedures (including critical care time)  Medications Ordered in UC Medications - No data to display  Initial Impression / Assessment and Plan / UC Course  I have reviewed the triage vital signs and the nursing notes.  Pertinent labs & imaging results that were available during my care of the patient were reviewed by me and considered in my medical decision making (see chart for details).     Vaginitis - aptima swab collected. Pelvic exam deferred Screen for STD - pt requesting HIV and RPR testing Glucosuria - pt is a  known diabetic. Is on insulin. Recommended tight glycemic control and f/u with PCP. Elevated glucose can predispose to yeast Urobilinogen on UA - likely secondary to patients known hx of Autoimmune hepatitis. F/U with PCP for any additional workup    Final Clinical Impressions(s) / UC Diagnoses   Final diagnoses:  Vaginitis and vulvovaginitis  Screen for STD (sexually transmitted disease)  Glucosuria  Abnormal urinalysis     Discharge Instructions      You were tested today for gonorrhea, chlamydia, trichomonas, BV, and yeast. You were also tested for syphillis and HIV. We will call you with the results of your test once received. Please avoid all forms of intercourse until test results received, and if positive for any STI, all partners will need to complete entire course of antibiotics prior to resuming. As always, practice safer sexual practices by using protection each and every time, and limiting number of partners.      ED Prescriptions   None    PDMP not reviewed this encounter.   Maretta Bees, [place] 12/30/22 2206

## 2022-12-30 NOTE — Discharge Instructions (Signed)
You were tested today for gonorrhea, chlamydia, trichomonas, BV, and yeast. You were also tested for syphillis and HIV. We will call you with the results of your test once received. Please avoid all forms of intercourse until test results received, and if positive for any STI, all partners will need to complete entire course of antibiotics prior to resuming. As always, practice safer sexual practices by using protection each and every time, and limiting number of partners.  

## 2022-12-31 LAB — RPR: RPR Ser Ql: NONREACTIVE

## 2023-01-01 ENCOUNTER — Other Ambulatory Visit: Payer: Self-pay | Admitting: Student

## 2023-01-01 DIAGNOSIS — E119 Type 2 diabetes mellitus without complications: Secondary | ICD-10-CM

## 2023-01-02 ENCOUNTER — Ambulatory Visit (INDEPENDENT_AMBULATORY_CARE_PROVIDER_SITE_OTHER): Payer: 59 | Admitting: *Deleted

## 2023-01-02 ENCOUNTER — Encounter: Payer: Self-pay | Admitting: Student

## 2023-01-02 ENCOUNTER — Encounter: Payer: Self-pay | Admitting: *Deleted

## 2023-01-02 ENCOUNTER — Ambulatory Visit (INDEPENDENT_AMBULATORY_CARE_PROVIDER_SITE_OTHER): Payer: 59 | Admitting: Student

## 2023-01-02 VITALS — BP 116/73 | HR 81 | Temp 98.4°F | Wt 186.9 lb

## 2023-01-02 DIAGNOSIS — G4709 Other insomnia: Secondary | ICD-10-CM | POA: Diagnosis not present

## 2023-01-02 DIAGNOSIS — Z6830 Body mass index (BMI) 30.0-30.9, adult: Secondary | ICD-10-CM

## 2023-01-02 DIAGNOSIS — I10 Essential (primary) hypertension: Secondary | ICD-10-CM

## 2023-01-02 DIAGNOSIS — K754 Autoimmune hepatitis: Secondary | ICD-10-CM | POA: Diagnosis not present

## 2023-01-02 DIAGNOSIS — F32A Depression, unspecified: Secondary | ICD-10-CM

## 2023-01-02 DIAGNOSIS — E119 Type 2 diabetes mellitus without complications: Secondary | ICD-10-CM | POA: Diagnosis not present

## 2023-01-02 DIAGNOSIS — Z794 Long term (current) use of insulin: Secondary | ICD-10-CM

## 2023-01-02 DIAGNOSIS — Z7984 Long term (current) use of oral hypoglycemic drugs: Secondary | ICD-10-CM | POA: Diagnosis not present

## 2023-01-02 DIAGNOSIS — E1169 Type 2 diabetes mellitus with other specified complication: Secondary | ICD-10-CM | POA: Diagnosis not present

## 2023-01-02 DIAGNOSIS — Z0000 Encounter for general adult medical examination without abnormal findings: Secondary | ICD-10-CM

## 2023-01-02 DIAGNOSIS — E669 Obesity, unspecified: Secondary | ICD-10-CM

## 2023-01-02 DIAGNOSIS — F331 Major depressive disorder, recurrent, moderate: Secondary | ICD-10-CM | POA: Diagnosis not present

## 2023-01-02 LAB — GLUCOSE, CAPILLARY: Glucose-Capillary: 136 mg/dL — ABNORMAL HIGH (ref 70–99)

## 2023-01-02 LAB — CERVICOVAGINAL ANCILLARY ONLY
Bacterial Vaginitis (gardnerella): NEGATIVE
Candida Glabrata: NEGATIVE
Candida Vaginitis: NEGATIVE
Chlamydia: NEGATIVE
Comment: NEGATIVE
Comment: NEGATIVE
Comment: NEGATIVE
Comment: NEGATIVE
Comment: NEGATIVE
Comment: NORMAL
Neisseria Gonorrhea: NEGATIVE
Trichomonas: NEGATIVE

## 2023-01-02 LAB — POCT GLYCOSYLATED HEMOGLOBIN (HGB A1C): Hemoglobin A1C: 7 % — AB (ref 4.0–5.6)

## 2023-01-02 MED ORDER — OZEMPIC (0.25 OR 0.5 MG/DOSE) 2 MG/3ML ~~LOC~~ SOPN: 0.2500 mg | PEN_INJECTOR | SUBCUTANEOUS | 11 refills | Status: DC

## 2023-01-02 MED ORDER — METFORMIN HCL ER 500 MG PO TB24: 1000.0000 mg | ORAL_TABLET | Freq: Two times a day (BID) | ORAL | 3 refills | Status: DC

## 2023-01-02 MED ORDER — INSULIN LISPRO 100 UNIT/ML IJ SOLN: 5.0000 [IU] | Freq: Three times a day (TID) | INTRAMUSCULAR | 11 refills | Status: DC

## 2023-01-02 MED ORDER — LANTUS SOLOSTAR 100 UNIT/ML ~~LOC~~ SOPN: 15.0000 [IU] | PEN_INJECTOR | Freq: Every day | SUBCUTANEOUS | 3 refills | Status: AC

## 2023-01-02 MED ORDER — VENLAFAXINE HCL ER 75 MG PO CP24: 75.0000 mg | ORAL_CAPSULE | Freq: Every day | ORAL | 11 refills | Status: DC

## 2023-01-02 NOTE — Assessment & Plan Note (Addendum)
BMI >35 assoc with HTN, depression and DM consistent with morbid obesity.  Has not been taking Ozempic for diabetes.  Discussed obesity may be contributing to her difficulty with sleeping and multiple other chronic conditions.  Will restart Ozempic 0.25 mg weekly.

## 2023-01-02 NOTE — Patient Instructions (Signed)

## 2023-01-02 NOTE — Progress Notes (Signed)
Subjective:   Hannah Young is a 57 y.o. female who presents for Medicare Annual (Subsequent) preventive examination.  Review of Systems    Defer to pcp        Objective:    Today's Vitals   01/02/23 1002 01/02/23 1403  BP: 116/73   Pulse: 81   Temp: 98.4 F (36.9 C)   TempSrc: Oral   SpO2: 97%   Weight: 186 lb 15.2 oz (84.8 kg)   PainSc:  10-Worst pain ever   Body mass index is 35.32 kg/m.     09/05/2022    9:45 AM 08/24/2022    9:08 AM 05/27/2022   10:52 AM 02/11/2022   12:35 PM 09/06/2021    9:10 AM 04/27/2021   10:44 AM 03/22/2021   11:05 AM  Advanced Directives  Does Patient Have a Medical Advance Directive? No No No No No  No  Would patient like information on creating a medical advance directive? No - Patient declined No - Patient declined No - Patient declined No - Patient declined No - Patient declined No - Patient declined No - Patient declined    Current Medications (verified) Outpatient Encounter Medications as of 01/02/2023  Medication Sig   Accu-Chek Softclix Lancets lancets Use as instructed to check blood sugar 3x a day   albuterol (VENTOLIN HFA) 108 (90 Base) MCG/ACT inhaler INHALE ONE TO TWO puffs into THE lungs EVERY SIX HOURS AS NEEDED SHORTNESS OF BREATH   blood glucose meter kit and supplies Dispense based on patient and insurance preference. Use up to four times daily as directed. (FOR ICD-10 E10.9, E11.9).   Blood Glucose Monitoring Suppl (ACCU-CHEK GUIDE ME) w/Device KIT Use 3 times per day to check your blood sugar.   celecoxib (CELEBREX) 200 MG capsule Take 1 capsule (200 mg total) by mouth 2 (two) times daily.   cyclobenzaprine (FLEXERIL) 10 MG tablet Take 1 tablet (10 mg total) by mouth 2 (two) times daily as needed for muscle spasms.   fluticasone (FLONASE) 50 MCG/ACT nasal spray Place 2 sprays into both nostrils daily as needed for allergies or rhinitis.    glucose blood (ACCU-CHEK GUIDE) test strip Use as instructed to check blood sugar 3  times a day   hydrOXYzine (ATARAX) 10 MG tablet Take 1 tablet (10 mg total) by mouth every 6 (six) hours as needed.   insulin glargine (LANTUS SOLOSTAR) 100 UNIT/ML Solostar Pen Inject 15 Units into the skin at bedtime.   insulin lispro (HUMALOG) 100 UNIT/ML injection Inject 0.05 mLs (5 Units total) into the skin 3 (three) times daily with meals.   lisinopril (ZESTRIL) 10 MG tablet TAKE ONE TABLET BY MOUTH EVERY DAY   mercaptopurine (PURINETHOL) 50 MG tablet Take 50 mg by mouth daily. Give on an empty stomach 1 hour before or 2 hours after meals. Caution: Chemotherapy.   metFORMIN (GLUCOPHAGE-XR) 500 MG 24 hr tablet Take 2 tablets (1,000 mg total) by mouth 2 (two) times daily with a meal.   omeprazole (PRILOSEC) 40 MG capsule Take 40 mg by mouth daily.   oxyCODONE (OXY IR/ROXICODONE) 5 MG immediate release tablet Take 1 tablet (5 mg total) by mouth every 6 (six) hours as needed for severe pain or breakthrough pain.   predniSONE (DELTASONE) 20 MG tablet Take 40 mg by mouth daily with breakfast.   rosuvastatin (CRESTOR) 20 MG tablet Take 1 tablet (20 mg total) by mouth daily.   SURE COMFORT PEN NEEDLES 32G X 6 MM MISC USE ONE needle  EVERY MORNING, NOON IN THE EVENING AND AT night   triamcinolone cream (KENALOG) 0.1 % Apply 1 application topically 2 (two) times daily.   venlafaxine XR (EFFEXOR-XR) 75 MG 24 hr capsule Take 1 capsule (75 mg total) by mouth daily with breakfast.   No facility-administered encounter medications on file as of 01/02/2023.    Allergies (verified) Amoxicillin, Augmentin [amoxicillin-pot clavulanate], Tylenol [acetaminophen], and Latex   History: Past Medical History:  Diagnosis Date   Anxiety    Arthritis    Asthma    Autoimmune hepatitis (HCC)    Bipolar 1 disorder (HCC)    Breast lump    Depression    Diabetes mellitus    FH: chemotherapy    Gout    Hair loss 10/21/2020   Headache 02/01/2021   High cholesterol    Housing situation unstable 03/22/2021    Hypertension    Liver cirrhosis (HCC)    Lung nodules    Neuropathy    Panic disorder    STD exposure 02/23/2021   Vaginal discharge 01/25/2017   Vertigo    Past Surgical History:  Procedure Laterality Date   Biopsy of liver     CESAREAN SECTION     LAPAROSCOPY     TUBAL LIGATION     Family History  Problem Relation Age of Onset   Liver disease Mother    Kidney failure Father    Heart disease Sister    Liver disease Daughter    Anxiety disorder Daughter    Heart disease Son    Colon cancer Neg Hx    Stomach cancer Neg Hx    Migraines Neg Hx    Headache Neg Hx    Social History   Socioeconomic History   Marital status: Widowed    Spouse name: Not on file   Number of children: 4   Years of education: Not on file   Highest education level: Not on file  Occupational History   Not on file  Tobacco Use   Smoking status: Former    Packs/day: .3    Types: Cigarettes    Quit date: 08/22/2020    Years since quitting: 2.3   Smokeless tobacco: Never   Tobacco comments:    2-3 cigs/day  Vaping Use   Vaping Use: Never used  Substance and Sexual Activity   Alcohol use: Not Currently    Alcohol/week: 3.0 standard drinks of alcohol    Types: 3 Cans of beer per week   Drug use: No   Sexual activity: Not Currently    Birth control/protection: Surgical    Comment: States she is not currently active  Other Topics Concern   Not on file  Social History Narrative   Current Social History 07/19/2020        Patient lives 2 Grandchildren in a home which is 1 story. There are steps up to the entrance the patient uses.       Patient's method of transportation is her "aide".      The highest level of education was high school diploma.      The patient currently disabled.      Identified important Relationships are Children and Grandchildren       Pets : yes       Interests / Fun: "nothing        Current Stressors: health, isolation       Social Determinants of Health    Financial Resource Strain: Not on file  Food Insecurity: No  Food Insecurity (01/02/2023)   Hunger Vital Sign    Worried About Running Out of Food in the Last Year: Never true    Ran Out of Food in the Last Year: Never true  Transportation Needs: Unmet Transportation Needs (01/02/2023)   PRAPARE - Transportation    Lack of Transportation (Medical): Yes    Lack of Transportation (Non-Medical): Yes  Physical Activity: Inactive (01/02/2023)   Exercise Vital Sign    Days of Exercise per Week: 0 days    Minutes of Exercise per Session: 0 min  Stress: Stress Concern Present (01/02/2023)   [employer] of Occupational Health - Occupational Stress Questionnaire    Feeling of Stress : Very much  Social Connections: Moderately Isolated (01/02/2023)   Social Connection and Isolation Panel [NHANES]    Frequency of Communication with Friends and Family: More than three times a week    Frequency of Social Gatherings with Friends and Family: Once a week    Attends Religious Services: More than 4 times per year    Active Member of [employer] or Organizations: No    Attends Banker Meetings: Never    Marital Status: Widowed    Tobacco Counseling Counseling given: Not Answered Tobacco comments: 2-3 cigs/day   Clinical Intake:  Pre-visit preparation completed: Yes  Pain : 0-10 Pain Score: 10-Worst pain ever Pain Type: Chronic pain Pain Location: Hip Pain Orientation: Left Pain Onset: More than a month ago Pain Frequency: Constant     BMI - recorded: 35.32 Nutritional Status: BMI > 30  Obese Nutritional Risks: None Diabetes: Yes CBG done?: Yes CBG resulted in Enter/ Edit results?: Yes Did pt. bring in CBG monitor from home?: No  How often do you need to have someone help you when you read instructions, pamphlets, or other written materials from your doctor or pharmacy?: 1 - Never  Diabetic?yes  Interpreter Needed?: No  Information entered by ::  [email or web address]   Activities of Daily Living    01/02/2023    2:53 PM 01/02/2023    2:22 PM  In your present state of health, do you have any difficulty performing the following activities:  Hearing? 0 0  Vision? 0 0  Difficulty concentrating or making decisions? 0 0  Walking or climbing stairs? 1 1  Dressing or bathing? 0 0  Doing errands, shopping? 0 0    Patient Care Team: Morene Crocker, MD as PCP - General  Indicate any recent Medical Services you may have received from other than [facility] providers in the past year (date may be approximate).     Assessment:   This is a routine wellness examination for [place].  Hearing/Vision screen No results found.  Dietary issues and exercise activities discussed:     Goals Addressed   None   Depression Screen    01/02/2023    2:54 PM 01/02/2023    2:53 PM 08/24/2022    9:09 AM 11/02/2021   10:16 AM 03/22/2021   12:04 PM 10/20/2020    9:26 AM 07/19/2020    9:58 AM  PHQ 2/9 Scores  PHQ - 2 Score 4 4 5 5 6 5 2   PHQ- 9 Score 18 18 14 14 23 23 11     Fall Risk    01/02/2023    2:23 PM 09/05/2022    9:44 AM 08/24/2022    9:08 AM 09/06/2021    9:09 AM 03/22/2021   11:04 AM  Fall Risk   Falls in the past year? 0 1  1 1 1   Number falls in past yr: 0 1 1 0   Injury with Fall? 0 1 1 1 1   Risk for fall due to : Other (Comment) Impaired balance/gait Impaired balance/gait Impaired balance/gait History of fall(s);Impaired balance/gait;Impaired mobility  Risk for fall due to: Comment hip pain      Follow up  Falls evaluation completed;Falls prevention discussed Falls evaluation completed Falls evaluation completed;Falls prevention discussed Falls prevention discussed    FALL RISK PREVENTION PERTAINING TO THE HOME:  Any stairs in or around the home? No  If so, are there any without handrails? No  Home free of loose throw rugs in walkways, pet beds, electrical cords, etc? No  Adequate lighting in your home to reduce risk of falls? Yes    ASSISTIVE DEVICES UTILIZED TO PREVENT FALLS:  Life alert? Yes  Use of a cane, walker or w/c? Yes  Grab bars in the bathroom? No  Shower chair or bench in shower? Yes  Elevated toilet seat or a handicapped toilet? Yes   TIMED UP AND GO:  Was the test performed? No .  Length of time to ambulate 10 feet: 0 sec.   Gait steady and fast without use of assistive device  Cognitive Function:        01/02/2023    4:28 PM  6CIT Screen  What Year? 0 points  What month? 0 points  What time? 0 points  Count back from 20 0 points  Months in reverse 2 points  Repeat phrase 0 points  Total Score 2 points    Immunizations Immunization History  Administered Date(s) Administered   Pneumococcal Polysaccharide-23 02/10/2019   Tdap 02/04/2021    TDAP status: Up to date  Flu Vaccine status: Up to date  Pneumococcal vaccine status: Due, Education has been provided regarding the importance of this vaccine. Advised may receive this vaccine at local pharmacy or Health Dept. Aware to provide a copy of the vaccination record if obtained from local pharmacy or Health Dept. Verbalized acceptance and understanding.  Covid-19 vaccine status: Information provided on how to obtain vaccines.   Qualifies for Shingles Vaccine? Yes   Zostavax completed No   Shingrix Completed?: No.    Education has been provided regarding the importance of this vaccine. Patient has been advised to call insurance company to determine out of pocket expense if they have not yet received this vaccine. Advised may also receive vaccine at local pharmacy or Health Dept. Verbalized acceptance and understanding.  Screening Tests Health Maintenance  Topic Date Due   COVID-19 Vaccine (1) Never done   Zoster Vaccines- Shingrix (1 of 2) Never done   COLONOSCOPY (Pts 45-28yrs Insurance coverage will need to be confirmed)  Never done   Diabetic kidney evaluation - Urine ACR  02/10/2020   OPHTHALMOLOGY EXAM  11/23/2020    INFLUENZA VACCINE  03/15/2023   HEMOGLOBIN A1C  07/05/2023   Diabetic kidney evaluation - eGFR measurement  09/06/2023   FOOT EXAM  01/02/2024   Medicare Annual Wellness (AWV)  01/02/2024   MAMMOGRAM  10/29/2024   PAP SMEAR-Modifier  11/02/2024   DTaP/Tdap/Td (2 - Td or Tdap) 02/05/2031   Hepatitis C Screening  Completed   HIV Screening  Completed   HPV VACCINES  Aged Out    Health Maintenance  Health Maintenance Due  Topic Date Due   COVID-19 Vaccine (1) Never done   Zoster Vaccines- Shingrix (1 of 2) Never done   COLONOSCOPY (Pts 45-43yrs Insurance coverage will need  to be confirmed)  Never done   Diabetic kidney evaluation - Urine ACR  02/10/2020   OPHTHALMOLOGY EXAM  11/23/2020    Colorectal cancer screening: Type of screening: Colonoscopy. Completed 06/06/2013. Repeat every 10 years  Mammogram status: Completed 10/31/2022. Repeat every year  Bone Density status: Completed unknown.   Lung Cancer Screening: (Low Dose CT Chest recommended if Age 83-80 years, 30 pack-year currently smoking OR have quit w/in 15years.) does not qualify.   Lung Cancer Screening Referral: n/a  Additional Screening:  Hepatitis C Screening: does not qualify; Completed 11/02/2021  Vision Screening: Recommended annual ophthalmology exams for early detection of glaucoma and other disorders of the eye. Is the patient up to date with their annual eye exam?  No  Who is the provider or what is the name of the office in which the patient attends annual eye exams? "I dont know" If pt is not established with a provider, would they like to be referred to a provider to establish care? Yes .   Dental Screening: Recommended annual dental exams for proper oral hygiene  Community Resource Referral / Chronic Care Management: CRR required this visit?  No   CCM required this visit?  No      Plan:     I have personally reviewed and noted the following in the patient's chart:   Medical and social  history Use of alcohol, tobacco or illicit drugs  Current medications and supplements including opioid prescriptions. Patient is not currently taking opioid prescriptions. Functional ability and status Nutritional status Physical activity Advanced directives List of other physicians Hospitalizations, surgeries, and ER visits in previous 12 months Vitals Screenings to include cognitive, depression, and falls Referrals and appointments  In addition, I have reviewed and discussed with patient certain preventive protocols, quality metrics, and best practice recommendations. A written personalized care plan for preventive services as well as general preventive health recommendations were provided to patient.     Kingsley Spittle [place], [place]   01/02/2023   Nurse Notes: face to face  Ms. Yetta Barre , Thank you for taking time to come for your Medicare Wellness Visit. I appreciate your ongoing commitment to your health goals. Please review the following plan we discussed and let me know if I can assist you in the future.   These are the goals we discussed:  Goals   None     This is a list of the screening recommended for you and due dates:  Health Maintenance  Topic Date Due   COVID-19 Vaccine (1) Never done   Zoster (Shingles) Vaccine (1 of 2) Never done   Colon Cancer Screening  Never done   Yearly kidney health urinalysis for diabetes  02/10/2020   Eye exam for diabetics  11/23/2020   Flu Shot  03/15/2023   Hemoglobin A1C  07/05/2023   Yearly kidney function blood test for diabetes  09/06/2023   Complete foot exam   01/02/2024   Medicare Annual Wellness Visit  01/02/2024   Mammogram  10/29/2024   Pap Smear  11/02/2024   DTaP/Tdap/Td vaccine (2 - Td or Tdap) 02/05/2031   Hepatitis C Screening: USPSTF Recommendation to screen - Ages 52-79 yo.  Completed   HIV Screening  Completed   HPV Vaccine  Aged Out

## 2023-01-02 NOTE — Patient Instructions (Addendum)
It was a pleasure seeing you in clinic today  Please restart ozempic 0.25 mg weekly  Increase venlafaxine to 75 mg daily daily  We will check urine labs today  I will order a sleep study for you  Follow up in 1 month

## 2023-01-03 ENCOUNTER — Ambulatory Visit: Payer: 59 | Admitting: Obstetrics

## 2023-01-03 VITALS — Ht 61.0 in | Wt 191.0 lb

## 2023-01-03 DIAGNOSIS — G4700 Insomnia, unspecified: Secondary | ICD-10-CM | POA: Insufficient documentation

## 2023-01-03 LAB — MICROALBUMIN / CREATININE URINE RATIO
Creatinine, Urine: 194.3 mg/dL
Microalb/Creat Ratio: 10 mg/g creat (ref 0–29)
Microalbumin, Urine: 19.3 ug/mL

## 2023-01-03 NOTE — Assessment & Plan Note (Signed)
Reports difficulty sleeping due to environment.  Lives at home with uncle, grandchildren, and cousin, states it is quite loud at nighttime.  Also has daytime sleepiness, frequent daytime napping, snoring at nighttime with waking due to apneic episodes.  Elevated STOP-BANG score today.  No prior sleep study in the past.  Symptoms suspicious for OSA.  She would be willing to wear CPAP if needed.  Will make referral for sleep study to evaluate for OSA.

## 2023-01-03 NOTE — Progress Notes (Signed)
Patient declines pap today, states that she will get endometrial

## 2023-01-03 NOTE — Assessment & Plan Note (Signed)
PHQ-9 of 18 today with GAD-7 of 20.  Taking Effexor 37.5 mg daily without much improvement.  Previously seeing counseling but has not seen lately.  Many family stressors involving her children and grandchildren.  Appears to have some response to starting on Effexor since last visit.  Will increase her Effexor to 75 mg daily and make referral to [facility]

## 2023-01-03 NOTE — Assessment & Plan Note (Signed)
Well-controlled on lisinopril 10 mg daily.  Continue current medications

## 2023-01-03 NOTE — Assessment & Plan Note (Signed)
Continues to have inconsistent usage of her prednisone and mercaptopurine.  Has not seen GI lately but has upcoming appointment next month.  Encouraged her to follow-up.

## 2023-01-03 NOTE — Progress Notes (Signed)
Established Patient Office Visit  Subjective   Patient ID: Hannah Young, female    DOB: [birth year]  Age: 57 y.o. MRN: [id number]  Chief Complaint  Patient presents with   Insomnia    Daytime somnolence Needs sleep study?    Medication Management    Uses albuterol inhaler everyday, but feels as if its not working     Hannah Young is a 57 y.o. person living with a history listed below who presents to clinic for insomnia. Please refer to problem based charting for further details and assessment and plan of current problem and chronic medical conditions     Patient Active Problem List   Diagnosis Date Noted   Insomnia 01/03/2023   Morbid obesity (HCC) 01/02/2023   PMB (postmenopausal bleeding) 11/09/2022   Endometrial thickening on ultrasound 11/09/2022   HLD (hyperlipidemia) 08/26/2022   Primary osteoarthritis of left hip 02/02/2022   Primary osteoarthritis of left knee 02/02/2022   Mold exposure 09/06/2021   Alopecia 10/21/2020   Chronic right shoulder pain 06/02/2020   Bilateral occipital neuralgia 05/01/2020   Chronic neck pain 05/01/2020   Arthralgia 03/29/2020   Osteoarthritis 02/12/2020   Healthcare maintenance 04/29/2018   Multiple lung nodules on CT 01/25/2017   Moderate recurrent major depression (HCC) 01/25/2017   Autoimmune hepatitis (HCC) 01/30/2012   Type 2 diabetes mellitus with other specified complication (HCC) 01/30/2012   Asthma 01/30/2012   Hypertension 01/30/2012   ROS: negative as per HPI    Objective:     BP 116/73 (BP Location: Right Arm, Patient Position: Sitting, Cuff Size: Normal)   Pulse 81   Temp 98.4 F (36.9 C) (Oral)   Wt 186 lb 14.4 oz (84.8 kg)   SpO2 97%   BMI 35.31 kg/m  BP Readings from Last 3 Encounters:  01/02/23 116/73  01/02/23 116/73  12/30/22 114/77      Physical Exam Constitutional:      Appearance: She is obese.     Comments: Chronically ill appearing  HENT:     Head: Normocephalic and atraumatic.      Mouth/Throat:     Mouth: Mucous membranes are moist.     Pharynx: Oropharynx is clear.  Eyes:     General: No scleral icterus.    Extraocular Movements: Extraocular movements intact.     Conjunctiva/sclera: Conjunctivae normal.     Pupils: Pupils are equal, round, and reactive to light.  Cardiovascular:     Rate and Rhythm: Normal rate and regular rhythm.     Heart sounds: No murmur heard. Pulmonary:     Effort: Pulmonary effort is normal.     Breath sounds: No rhonchi or rales.  Abdominal:     General: Abdomen is flat. Bowel sounds are normal. There is no distension.     Palpations: Abdomen is soft.     Tenderness: There is no abdominal tenderness.  Musculoskeletal:        General: Normal range of motion.     Right lower leg: No edema.     Left lower leg: No edema.  Skin:    General: Skin is warm and dry.     Capillary Refill: Capillary refill takes less than 2 seconds.     Coloration: Skin is not jaundiced.  Neurological:     General: No focal deficit present.     Mental Status: She is alert and oriented to person, place, and time.  Psychiatric:        Mood and Affect: Mood  normal.        Behavior: Behavior normal.      Results for orders placed or performed in visit on 01/02/23  Glucose, capillary  Result Value Ref Range   Glucose-Capillary 136 (H) 70 - 99 mg/dL  Microalbumin / Creatinine Urine Ratio  Result Value Ref Range   Creatinine, Urine 194.3 Not Estab. mg/dL   Microalbumin, Urine 16.1 Not Estab. ug/mL   Microalb/Creat Ratio 10 0 - 29 mg/g creat  POC Hbg A1C  Result Value Ref Range   Hemoglobin A1C 7.0 (A) 4.0 - 5.6 %   HbA1c POC (<> result, manual entry)     HbA1c, POC (prediabetic range)     HbA1c, POC (controlled diabetic range)      Last metabolic panel Lab Results  Component Value Date   GLUCOSE 218 (H) 09/05/2022   NA 143 09/05/2022   K 4.0 09/05/2022   CL 106 09/05/2022   CO2 20 09/05/2022   BUN 18 09/05/2022   CREATININE 0.60  09/05/2022   EGFR 107 09/05/2022   CALCIUM 9.0 09/05/2022   PHOS 5.2 (H) 05/27/2022   PROT 8.1 05/27/2022   ALBUMIN 3.9 05/27/2022   LABGLOB 4.3 11/24/2019   AGRATIO 0.8 (L) 11/24/2019   BILITOT 1.1 05/27/2022   ALKPHOS 120 05/27/2022   AST 74 (H) 05/27/2022   ALT 101 (H) 05/27/2022   ANIONGAP 16 (H) 05/27/2022   Last hemoglobin A1c Lab Results  Component Value Date   HGBA1C 7.0 (A) 01/02/2023      The 10-year ASCVD risk score (Arnett DK, et al., 2019) is: 5.3%    Assessment & Plan:   Problem List Items Addressed This Visit     Autoimmune hepatitis (HCC) (Chronic)    Continues to have inconsistent usage of her prednisone and mercaptopurine.  Has not seen GI lately but has upcoming appointment next month.  Encouraged her to follow-up.      Hypertension (Chronic)    Well-controlled on lisinopril 10 mg daily.  Continue current medications      Type 2 diabetes mellitus with other specified complication (HCC) - Primary    A1c improved to 7 today.  Is taking Lantus 10 units and Humalog 5 units with meals.  She did not bring her meter to the office today.  Reports compliance with metformin 1000 mg twice twice daily GI side effects.  Has not been taking Ozempic.  Reports she is concerned about the weight loss.  States she feels she looks too skinny.  Discussed that she is morbidly obese and would benefit from Ozempic for her diabetes as well as for weight loss.  Multiple long acting insulins including Levemir, Lantus, and Soliqua.  I believe she is currently taking Lantus.  Patient is unable to medication.  Overall has had difficulty with medication compliance.  Restart Ozempic 0.25 mg weekly Continue Lantus and Humalog Continue metformin Referral to [facility] for pharmacy education and medication management Urine microalbumin/cr today Repeat A1c in 3 months       Relevant Medications   metFORMIN (GLUCOPHAGE-XR) 500 MG 24 hr tablet   insulin glargine (LANTUS SOLOSTAR)  100 UNIT/ML Solostar Pen   insulin lispro (HUMALOG) 100 UNIT/ML injection   Semaglutide,0.25 or 0.5MG /DOS, (OZEMPIC, 0.25 OR 0.5 MG/DOSE,) 2 MG/3ML SOPN   Moderate recurrent major depression (HCC)    PHQ-9 of 18 today with GAD-7 of 20.  Taking Effexor 37.5 mg daily without much improvement.  Previously seeing counseling but has not seen lately.  Many family  stressors involving her children and grandchildren.  Appears to have some response to starting on Effexor since last visit.  Will increase her Effexor to 75 mg daily and make referral to [facility]      Relevant Medications   venlafaxine XR (EFFEXOR-XR) 75 MG 24 hr capsule   Other Relevant Orders   Ambulatory referral to [facility]   Morbid obesity (HCC)    BMI >35 assoc with HTN, depression and DM consistent with morbid obesity.  Has not been taking Ozempic for diabetes.  Discussed obesity may be contributing to her difficulty with sleeping and multiple other chronic conditions.  Will restart Ozempic 0.25 mg weekly.      Relevant Medications   metFORMIN (GLUCOPHAGE-XR) 500 MG 24 hr tablet   insulin glargine (LANTUS SOLOSTAR) 100 UNIT/ML Solostar Pen   insulin lispro (HUMALOG) 100 UNIT/ML injection   Semaglutide,0.25 or 0.5MG /DOS, (OZEMPIC, 0.25 OR 0.5 MG/DOSE,) 2 MG/3ML SOPN   Other Relevant Orders   Ambulatory referral to Sleep Studies   Insomnia    Reports difficulty sleeping due to environment.  Lives at home with uncle, grandchildren, and cousin, states it is quite loud at nighttime.  Also has daytime sleepiness, frequent daytime napping, snoring at nighttime with waking due to apneic episodes.  Elevated STOP-BANG score today.  No prior sleep study in the past.  Symptoms suspicious for OSA.  She would be willing to wear CPAP if needed.  Will make referral for sleep study to evaluate for OSA.      Relevant Orders   Ambulatory referral to Sleep Studies   Other Visit Diagnoses     Obesity (BMI 30-39.9)       Relevant  Medications   metFORMIN (GLUCOPHAGE-XR) 500 MG 24 hr tablet   insulin glargine (LANTUS SOLOSTAR) 100 UNIT/ML Solostar Pen   insulin lispro (HUMALOG) 100 UNIT/ML injection   Semaglutide,0.25 or 0.5MG /DOS, (OZEMPIC, 0.25 OR 0.5 MG/DOSE,) 2 MG/3ML SOPN       Return in about 4 weeks (around 01/30/2023).    Quincy Simmonds, MD

## 2023-01-03 NOTE — Assessment & Plan Note (Signed)
A1c improved to 7 today.  Is taking Lantus 10 units and Humalog 5 units with meals.  She did not bring her meter to the office today.  Reports compliance with metformin 1000 mg twice twice daily GI side effects.  Has not been taking Ozempic.  Reports she is concerned about the weight loss.  States she feels she looks too skinny.  Discussed that she is morbidly obese and would benefit from Ozempic for her diabetes as well as for weight loss.  Multiple long acting insulins including Levemir, Lantus, and Soliqua.  I believe she is currently taking Lantus.  Patient is unable to medication.  Overall has had difficulty with medication compliance.  Restart Ozempic 0.25 mg weekly Continue Lantus and Humalog Continue metformin Referral to [facility] for pharmacy education and medication management Urine microalbumin/cr today Repeat A1c in 3 months

## 2023-01-04 ENCOUNTER — Other Ambulatory Visit (INDEPENDENT_AMBULATORY_CARE_PROVIDER_SITE_OTHER): Payer: 59

## 2023-01-04 ENCOUNTER — Ambulatory Visit (INDEPENDENT_AMBULATORY_CARE_PROVIDER_SITE_OTHER): Payer: 59 | Admitting: Orthopaedic Surgery

## 2023-01-04 DIAGNOSIS — M1711 Unilateral primary osteoarthritis, right knee: Secondary | ICD-10-CM

## 2023-01-04 DIAGNOSIS — M170 Bilateral primary osteoarthritis of knee: Secondary | ICD-10-CM

## 2023-01-04 DIAGNOSIS — M1612 Unilateral primary osteoarthritis, left hip: Secondary | ICD-10-CM | POA: Diagnosis not present

## 2023-01-04 NOTE — Progress Notes (Signed)
Office Visit Note   Patient: Hannah Young           Date of Birth: 08-17-1965           MRN: [id number] Visit Date: 01/04/2023              Requested by: Hannah Crocker, MD [address] [place],  [place] [address] PCP: Hannah Crocker, MD   Assessment & Plan: Visit Diagnoses:  1. Bilateral primary osteoarthritis of knee   2. Primary osteoarthritis of left hip     Plan: Impression is 57 year old female with end-stage left hip DJD and moderately severe bilateral knee DJD.  Treatment options were again discussed and at this time she is not interested in surgical intervention.  She will continue to work on weight loss.  Sounds like she does not have stable housing right now and she is worried about the postop care which I fully agree with.  She will follow-up with me when she feels that her housing situation improves.  Follow-Up Instructions: No follow-ups on file.   Orders:  Orders Placed This Encounter  Procedures   XR KNEE 3 VIEW RIGHT   XR KNEE 3 VIEW LEFT   XR HIP UNILAT W OR W/O PELVIS 2-3 VIEWS LEFT   No orders of the defined types were placed in this encounter.     Procedures: No procedures performed   Clinical Data: No additional findings.   Subjective: Chief Complaint  Patient presents with   Right Knee - Pain   Left Knee - Pain    HPI Hannah Young is a 57 year old female following up for bilateral knee pain and left hip DJD.  Her A1c has come down to a 7.  She has also worked diligently on weight loss.  Her left hip and her knees continue to bother her quite a bit. Review of Systems  Constitutional: Negative.   HENT: Negative.    Eyes: Negative.   Respiratory: Negative.    Cardiovascular: Negative.   Endocrine: Negative.   Musculoskeletal: Negative.   Neurological: Negative.   Hematological: Negative.   Psychiatric/Behavioral: Negative.    All other systems reviewed and are negative.    Objective: Vital Signs: There were no vitals  taken for this visit.  Physical Exam Vitals and nursing note reviewed.  Constitutional:      Appearance: She is well-developed.  HENT:     Head: Atraumatic.     Nose: Nose normal.  Eyes:     Extraocular Movements: Extraocular movements intact.  Cardiovascular:     Pulses: Normal pulses.  Pulmonary:     Effort: Pulmonary effort is normal.  Abdominal:     Palpations: Abdomen is soft.  Musculoskeletal:     Cervical back: Neck supple.  Skin:    General: Skin is warm.     Capillary Refill: Capillary refill takes less than 2 seconds.  Neurological:     Mental Status: She is alert. Mental status is at baseline.  Psychiatric:        Behavior: Behavior normal.        Thought Content: Thought content normal.        Judgment: Judgment normal.     Ortho Exam Examination of left hip is unchanged. Examination of bilateral knees show pain and crepitus throughout range of motion.  Because her cruciates are stable.  Bilateral joint line tenderness. Specialty Comments:  No specialty comments available.  Imaging: No results found.   PMFS History: Patient Active Problem List  Diagnosis Date Noted   Insomnia 01/03/2023   Morbid obesity (HCC) 01/02/2023   PMB (postmenopausal bleeding) 11/09/2022   Endometrial thickening on ultrasound 11/09/2022   HLD (hyperlipidemia) 08/26/2022   Primary osteoarthritis of left hip 02/02/2022   Primary osteoarthritis of left knee 02/02/2022   Mold exposure 09/06/2021   Alopecia 10/21/2020   Chronic right shoulder pain 06/02/2020   Bilateral occipital neuralgia 05/01/2020   Chronic neck pain 05/01/2020   Arthralgia 03/29/2020   Osteoarthritis 02/12/2020   Healthcare maintenance 04/29/2018   Multiple lung nodules on CT 01/25/2017   Moderate recurrent major depression (HCC) 01/25/2017   Autoimmune hepatitis (HCC) 01/30/2012   Type 2 diabetes mellitus with other specified complication (HCC) 01/30/2012   Asthma 01/30/2012   Hypertension  01/30/2012   Past Medical History:  Diagnosis Date   Anxiety    Arthritis    Asthma    Autoimmune hepatitis (HCC)    Bipolar 1 disorder (HCC)    Breast lump    Depression    Diabetes mellitus    FH: chemotherapy    Gout    Hair loss 10/21/2020   Headache 02/01/2021   High cholesterol    Housing situation unstable 03/22/2021   Hypertension    Liver cirrhosis (HCC)    Lung nodules    Neuropathy    Panic disorder    STD exposure 02/23/2021   Vaginal discharge 01/25/2017   Vertigo     Family History  Problem Relation Age of Onset   Liver disease Mother    Kidney failure Father    Heart disease Sister    Liver disease Daughter    Anxiety disorder Daughter    Heart disease Son    Colon cancer Neg Hx    Stomach cancer Neg Hx    Migraines Neg Hx    Headache Neg Hx     Past Surgical History:  Procedure Laterality Date   Biopsy of liver     CESAREAN SECTION     LAPAROSCOPY     TUBAL LIGATION     Social History   Occupational History   Not on file  Tobacco Use   Smoking status: Former    Packs/day: .3    Types: Cigarettes    Quit date: 08/22/2020    Years since quitting: 2.3   Smokeless tobacco: Never   Tobacco comments:    2-3 cigs/day  Vaping Use   Vaping Use: Never used  Substance and Sexual Activity   Alcohol use: Not Currently    Alcohol/week: 3.0 standard drinks of alcohol    Types: 3 Cans of beer per week   Drug use: No   Sexual activity: Not Currently    Birth control/protection: Surgical    Comment: States she is not currently active

## 2023-01-05 ENCOUNTER — Telehealth: Payer: Self-pay

## 2023-01-05 NOTE — Progress Notes (Signed)
Internal Medicine Clinic Attending ? ?Case discussed with Dr. Liang  At the time of the visit.  We reviewed the resident?s history and exam and pertinent patient test results.  I agree with the assessment, diagnosis, and plan of care documented in the resident?s note. ? ?

## 2023-01-05 NOTE — Progress Notes (Signed)
   Care Guide Note  01/05/2023 Name: CESARIA DEBACA MRN: [id number] DOB: 1966-06-15  Referred by: Morene Crocker, MD Reason for referral : Care Coordination (Outreach to schedule with pharm d )   ZULIANA DUPONT is a 57 y.o. year old female who is a primary care patient of Morene Crocker, MD. FLORINE SYBERT was referred to the pharmacist for assistance related to HTN.    An unsuccessful telephone outreach was attempted today to contact the patient who was referred to the pharmacy team for assistance with medication management. Additional attempts will be made to contact the patient.   Penne Lash, RMA Care Guide [facility]  [place], [place] [address] Direct Dial: [phone] [email or web address]

## 2023-01-10 NOTE — Progress Notes (Signed)
   Care Guide Note  01/10/2023 Name: Hannah Young MRN: [id number] DOB: 1965-12-08  Referred by: Morene Crocker, MD Reason for referral : Care Coordination (Outreach to schedule with pharm d )   Hannah Young is a 57 y.o. year old female who is a primary care patient of Morene Crocker, MD. Hannah Young was referred to the pharmacist for assistance related to HTN.    A second unsuccessful telephone outreach was attempted today to contact the patient who was referred to the pharmacy team for assistance with medication management. Additional attempts will be made to contact the patient.  Hannah Young, RMA Care Guide [facility]  [place], [place] [address] Direct Dial: [phone] [email or web address]

## 2023-01-10 NOTE — Progress Notes (Signed)
Appointment cancelled

## 2023-01-11 ENCOUNTER — Ambulatory Visit: Payer: 59 | Admitting: Orthopedic Surgery

## 2023-01-11 ENCOUNTER — Ambulatory Visit: Payer: 59 | Admitting: Podiatry

## 2023-01-15 ENCOUNTER — Encounter: Payer: Self-pay | Admitting: Orthopedic Surgery

## 2023-01-15 ENCOUNTER — Ambulatory Visit: Payer: 59 | Admitting: Podiatry

## 2023-01-15 NOTE — Progress Notes (Signed)
Internal Medicine Clinic Attending  Case and documentation of Dr. Liang  reviewed.  I reviewed the AWV findings.  I agree with the assessment, diagnosis, and plan of care documented in the AWV note.     

## 2023-01-16 ENCOUNTER — Telehealth: Payer: Self-pay | Admitting: Orthopedic Surgery

## 2023-01-16 NOTE — Telephone Encounter (Signed)
-----   Message from [facility] May, RT sent at 01/15/2023  7:57 PM EDT ----- Can you all please help me with this?  Needs to be printed and taken to the PO sent by certified mail.  Thank you in advance.

## 2023-01-16 NOTE — Telephone Encounter (Signed)
Letter printed, and will be sent certified mail this week. 01/16/23.

## 2023-01-18 ENCOUNTER — Telehealth: Payer: Self-pay | Admitting: *Deleted

## 2023-01-18 NOTE — Telephone Encounter (Signed)
Call from pt stating she needs to be admitted to the hospital for 1 or 2 days so "I can get some rest".  States she cannot get enough sleep. She states this is why she cannot go to her appts b/c something always comes up.  States has custody of her daughter's children; states one has ADHD, another one PTSD. I asked if they ever  go to your daughter's house sometimes; pt states she sends them right back to her. Suddenly, she states she has to go (someone is coming); she hangs up the phone.

## 2023-01-24 ENCOUNTER — Ambulatory Visit (HOSPITAL_COMMUNITY): Payer: 59

## 2023-01-24 ENCOUNTER — Ambulatory Visit: Payer: 59 | Admitting: Podiatry

## 2023-01-24 NOTE — Progress Notes (Signed)
   Care Guide Note  01/24/2023 Name: Hannah Young MRN: [id number] DOB: [birth year]  Referred by: Morene Crocker, MD Reason for referral : Care Coordination (Outreach to schedule with pharm d )   Hannah Young is a 57 y.o. year old female who is a primary care patient of Morene Crocker, MD. Hannah Young was referred to the pharmacist for assistance related to DM.    A third unsuccessful telephone outreach was attempted today to contact the patient who was referred to the pharmacy team for assistance with medication management. The Population Health team is pleased to engage with this patient at any time in the future upon receipt of referral and should he/she be interested in assistance from the [facility] team.   Penne Lash, RMA Care Guide [facility]  [place], [place] [address] Direct Dial: [phone] [email or web address]

## 2023-01-25 ENCOUNTER — Ambulatory Visit (HOSPITAL_COMMUNITY)
Admission: RE | Admit: 2023-01-25 | Discharge: 2023-01-25 | Disposition: A | Discharge status: Home / Self Care | Payer: 59 | Source: Ambulatory Visit | Attending: Sports Medicine | Admitting: Sports Medicine

## 2023-01-25 ENCOUNTER — Encounter (HOSPITAL_COMMUNITY): Payer: Self-pay

## 2023-01-25 VITALS — BP 123/89 | HR 69 | Temp 97.3°F | Resp 18

## 2023-01-25 DIAGNOSIS — J029 Acute pharyngitis, unspecified: Secondary | ICD-10-CM | POA: Diagnosis not present

## 2023-01-25 DIAGNOSIS — R0981 Nasal congestion: Secondary | ICD-10-CM | POA: Diagnosis not present

## 2023-01-25 DIAGNOSIS — H938X3 Other specified disorders of ear, bilateral: Secondary | ICD-10-CM

## 2023-01-25 MED ORDER — CETIRIZINE HCL 5 MG PO TABS: 5.0000 mg | ORAL_TABLET | Freq: Every day | ORAL | 0 refills | Status: DC

## 2023-01-25 MED ORDER — FLUTICASONE PROPIONATE 50 MCG/ACT NA SUSP: 2.0000 | Freq: Every day | NASAL | 2 refills | Status: DC

## 2023-01-25 NOTE — ED Triage Notes (Signed)
Pt states she is having hip pain and bilateral knee pain. She states she is scheduled to have surgery soon she has a physician that follow her for this. She states the pain has got so bad that she can't stand it. She states she isnt taking any meds for the pain she did have oxycodone but she is out of those. She states the issue for not having surgery yet is because her blood sugar and A1C is too high. She called and asked about more pain meds and he advised her to give call PCP to get them and PCP advised her to come here.    She states she has bilateral ear pain she says it feels like her ears are raw.

## 2023-01-25 NOTE — Discharge Instructions (Addendum)
I have sent to your pharmacy a nasal spray to help with fullness in your ears and an antihistamine medication to help with the nasal drainage that is irritating your throat.  Follow-up with your primary care provider if symptoms worsen or fail to improve.

## 2023-01-25 NOTE — ED Provider Notes (Signed)
[facility]    CSN: [id number] Arrival date & time: 01/25/23  8295      History   Chief Complaint Chief Complaint  Patient presents with   Hip Pain   Knee Pain    HPI Hannah Young is a 57 y.o. female.   She is here today with chief complaint of bilateral ear fullness left greater than right and scratchy throat.  She reports her symptoms been off and on for the past 3 weeks.  She has also had some drainage from her nose over the past 3 weeks.  She has not tried any intervention.  She is an allergy to Tylenol and ibuprofen upsets her stomach.  She denies any fevers or chills, or other sick contacts.  She has had symptoms similar to this in the past however they typically resolve on their own. She originally wanted to be seen for hip and knee pain however she will follow-up with Ortho or her primary care provider from that standpoint as we are unable to provide chronic pain medication for her.   Hip Pain  Knee Pain   Past Medical History:  Diagnosis Date   Anxiety    Arthritis    Asthma    Autoimmune hepatitis (HCC)    Bipolar 1 disorder (HCC)    Breast lump    Depression    Diabetes mellitus    FH: chemotherapy    Gout    Hair loss 10/21/2020   Headache 02/01/2021   High cholesterol    Housing situation unstable 03/22/2021   Hypertension    Liver cirrhosis (HCC)    Lung nodules    Neuropathy    Panic disorder    STD exposure 02/23/2021   Vaginal discharge 01/25/2017   Vertigo     Patient Active Problem List   Diagnosis Date Noted   Insomnia 01/03/2023   Morbid obesity (HCC) 01/02/2023   PMB (postmenopausal bleeding) 11/09/2022   Endometrial thickening on ultrasound 11/09/2022   HLD (hyperlipidemia) 08/26/2022   Primary osteoarthritis of left hip 02/02/2022   Primary osteoarthritis of left knee 02/02/2022   Mold exposure 09/06/2021   Alopecia 10/21/2020   Chronic right shoulder pain 06/02/2020   Bilateral occipital neuralgia 05/01/2020    Chronic neck pain 05/01/2020   Arthralgia 03/29/2020   Osteoarthritis 02/12/2020   Healthcare maintenance 04/29/2018   Multiple lung nodules on CT 01/25/2017   Moderate recurrent major depression (HCC) 01/25/2017   Autoimmune hepatitis (HCC) 01/30/2012   Type 2 diabetes mellitus with other specified complication (HCC) 01/30/2012   Asthma 01/30/2012   Hypertension 01/30/2012    Past Surgical History:  Procedure Laterality Date   Biopsy of liver     CESAREAN SECTION     LAPAROSCOPY     TUBAL LIGATION      OB History     Gravida  6   Para  4   Term  0   Preterm  4   AB  2   Living  4      SAB  2   IAB  0   Ectopic      Multiple  0   Live Births  3            Home Medications    Prior to Admission medications   Medication Sig Start Date End Date Taking? Authorizing Provider  Accu-Chek Softclix Lancets lancets Use as instructed to check blood sugar 3x a day 08/25/22  Yes Morene Crocker, MD  albuterol (  VENTOLIN HFA) 108 (90 Base) MCG/ACT inhaler INHALE ONE TO TWO puffs into THE lungs EVERY SIX HOURS AS NEEDED SHORTNESS OF BREATH 11/01/22  Yes Morene Crocker, MD  blood glucose meter kit and supplies Dispense based on patient and insurance preference. Use up to four times daily as directed. (FOR ICD-10 E10.9, E11.9). 08/25/22  Yes Gwenevere Abbot, MD  Blood Glucose Monitoring Suppl (ACCU-CHEK GUIDE ME) w/Device KIT Use 3 times per day to check your blood sugar. 08/25/22  Yes Morene Crocker, MD  celecoxib (CELEBREX) 200 MG capsule Take 1 capsule (200 mg total) by mouth 2 (two) times daily. 02/02/22  Yes Tarry Kos, MD  cetirizine (ZYRTEC) 5 MG tablet Take 1 tablet (5 mg total) by mouth daily. 01/25/23  Yes Gillermo Murdoch A, DO  cyclobenzaprine (FLEXERIL) 10 MG tablet Take 1 tablet (10 mg total) by mouth 2 (two) times daily as needed for muscle spasms. 11/13/22  Yes Cristie Hem, PA-C  fluticasone (FLONASE) 50 MCG/ACT nasal spray Place 2  sprays into both nostrils daily. 01/25/23  Yes Gillermo Murdoch A, DO  glucose blood (ACCU-CHEK GUIDE) test strip Use as instructed to check blood sugar 3 times a day 08/25/22  Yes Morene Crocker, MD  hydrOXYzine (ATARAX) 10 MG tablet Take 1 tablet (10 mg total) by mouth every 6 (six) hours as needed. 09/11/22  Yes Morene Crocker, MD  insulin glargine (LANTUS SOLOSTAR) 100 UNIT/ML Solostar Pen Inject 15 Units into the skin at bedtime. 01/02/23  Yes Quincy Simmonds, MD  insulin lispro (HUMALOG) 100 UNIT/ML injection Inject 0.05 mLs (5 Units total) into the skin 3 (three) times daily with meals. 01/02/23  Yes Quincy Simmonds, MD  lisinopril (ZESTRIL) 10 MG tablet TAKE ONE TABLET BY MOUTH EVERY DAY 11/21/22  Yes Morene Crocker, MD  mercaptopurine (PURINETHOL) 50 MG tablet Take 50 mg by mouth daily. Give on an empty stomach 1 hour before or 2 hours after meals. Caution: Chemotherapy.   Yes [provider]  metFORMIN (GLUCOPHAGE-XR) 500 MG 24 hr tablet Take 2 tablets (1,000 mg total) by mouth 2 (two) times daily with a meal. 01/02/23 12/28/23 Yes Quincy Simmonds, MD  omeprazole (PRILOSEC) 40 MG capsule Take 40 mg by mouth daily. 06/28/20  Yes [provider]  predniSONE (DELTASONE) 20 MG tablet Take 40 mg by mouth daily with breakfast.   Yes [provider]  rosuvastatin (CRESTOR) 20 MG tablet Take 1 tablet (20 mg total) by mouth daily. 08/25/22 08/25/23 Yes Gwenevere Abbot, MD  Semaglutide,0.25 or 0.5MG /DOS, (OZEMPIC, 0.25 OR 0.5 MG/DOSE,) 2 MG/3ML SOPN Inject 0.25 mg into the skin once a week. 01/02/23  Yes Quincy Simmonds, MD  SURE COMFORT PEN NEEDLES 32G X 6 MM MISC USE ONE needle EVERY MORNING, NOON IN THE EVENING AND AT night 01/02/23  Yes Morene Crocker, MD  triamcinolone cream (KENALOG) 0.1 % Apply 1 application topically 2 (two) times daily. 04/03/21  Yes Ivette Loyal, NP  venlafaxine XR (EFFEXOR-XR) 75 MG 24 hr capsule Take 1 capsule (75 mg total) by mouth  daily with breakfast. 01/02/23 12/28/23 Yes Quincy Simmonds, MD  oxyCODONE (OXY IR/ROXICODONE) 5 MG immediate release tablet Take 1 tablet (5 mg total) by mouth every 6 (six) hours as needed for severe pain or breakthrough pain. 12/26/22   Madelyn Brunner, DO    Family History Family History  Problem Relation Age of Onset   Liver disease Mother    Kidney failure Father    Heart disease Sister    Liver disease  Daughter    Anxiety disorder Daughter    Heart disease Son    Colon cancer Neg Hx    Stomach cancer Neg Hx    Migraines Neg Hx    Headache Neg Hx     Social History Social History   Tobacco Use   Smoking status: Former    Packs/day: .3    Types: Cigarettes    Quit date: 08/22/2020    Years since quitting: 2.4   Smokeless tobacco: Never   Tobacco comments:    2-3 cigs/day  Vaping Use   Vaping Use: Never used  Substance Use Topics   Alcohol use: Not Currently    Alcohol/week: 3.0 standard drinks of alcohol    Types: 3 Cans of beer per week   Drug use: No     Allergies   Amoxicillin, Augmentin [amoxicillin-pot clavulanate], Tylenol [acetaminophen], and Latex   Review of Systems Review of Systems as listed above in HPI   Physical Exam Triage Vital Signs ED Triage Vitals  Enc Vitals Group     BP 01/25/23 0859 123/89     Pulse Rate 01/25/23 0859 69     Resp 01/25/23 0859 18     Temp 01/25/23 0859 (!) 97.3 F (36.3 C)     Temp Source 01/25/23 0859 Oral     SpO2 01/25/23 0859 99 %     Weight --      Height --      Head Circumference --      Peak Flow --      Pain Score 01/25/23 0857 10     Pain Loc --      Pain Edu? --      Excl. in GC? --    No data found.  Updated Vital Signs BP 123/89 (BP Location: Left Arm)   Pulse 69   Temp (!) 97.3 F (36.3 C) (Oral)   Resp 18   SpO2 99%   Physical Exam Vitals reviewed.  Constitutional:      General: She is not in acute distress.    Appearance: Normal appearance. She is obese. She is not ill-appearing,  toxic-appearing or diaphoretic.  HENT:     Head: Normocephalic and atraumatic.     Right Ear: Tympanic membrane, ear canal and external ear normal. There is no impacted cerumen.     Left Ear: Tympanic membrane, ear canal and external ear normal. There is no impacted cerumen.     Nose: Congestion present. No rhinorrhea.     Mouth/Throat:     Mouth: Mucous membranes are moist.     Pharynx: Posterior oropharyngeal erythema present.  Eyes:     Conjunctiva/sclera: Conjunctivae normal.  Cardiovascular:     Rate and Rhythm: Normal rate.  Pulmonary:     Effort: Pulmonary effort is normal.  Skin:    General: Skin is warm.  Neurological:     Mental Status: She is alert.  Psychiatric:        Mood and Affect: Mood normal.        Behavior: Behavior normal.        Thought Content: Thought content normal.        Judgment: Judgment normal.      UC Treatments / Results  Labs (all labs ordered are listed, but only abnormal results are displayed) Labs Reviewed - No data to display  EKG   Radiology No results found.  Procedures Procedures (including critical care time)  Medications Ordered in UC Medications - No data  to display  Initial Impression / Assessment and Plan / UC Course  I have reviewed the triage vital signs and the nursing notes.  Pertinent labs & imaging results that were available during my care of the patient were reviewed by me and considered in my medical decision making (see chart for details).     Bilateral ear fullness nasal congestion sore throat Duration of her symptoms were indicative of allergic response.  I sent to her pharmacy Flonase mildly swelling in her nasal cavity and an antihistamine, cetirizine to take daily for the next 30 days to help with the nasal drainage.  Follow-up with her primary care provider if symptoms worsen or fail to improve. Final Clinical Impressions(s) / UC Diagnoses   Final diagnoses:  Ear fullness, bilateral  Sore throat   Nasal congestion     Discharge Instructions      I have sent to your pharmacy a nasal spray to help with fullness in your ears and an antihistamine medication to help with the nasal drainage that is irritating your throat.  Follow-up with your primary care provider if symptoms worsen or fail to improve.   ED Prescriptions     Medication Sig Dispense Auth. Provider   fluticasone (FLONASE) 50 MCG/ACT nasal spray Place 2 sprays into both nostrils daily. 16 mL Emmert Roethler A, DO   cetirizine (ZYRTEC) 5 MG tablet Take 1 tablet (5 mg total) by mouth daily. 30 tablet Gillermo Murdoch A, DO      PDMP not reviewed this encounter.   Claudie Leach, DO 01/25/23 [phone]

## 2023-01-29 DIAGNOSIS — L819 Disorder of pigmentation, unspecified: Secondary | ICD-10-CM | POA: Diagnosis not present

## 2023-01-29 DIAGNOSIS — L905 Scar conditions and fibrosis of skin: Secondary | ICD-10-CM | POA: Diagnosis not present

## 2023-02-20 ENCOUNTER — Institutional Professional Consult (permissible substitution): Payer: 59 | Admitting: Neurology

## 2023-02-27 ENCOUNTER — Telehealth: Payer: Self-pay | Admitting: Orthopaedic Surgery

## 2023-02-27 NOTE — Telephone Encounter (Signed)
Patient calling wanting to schedule hip surgery.  Please provider surgery sheet if ok to schedule.

## 2023-02-27 NOTE — Telephone Encounter (Signed)
She needs to follow up to get updated BMI and talk about social support.  Thanks.

## 2023-02-28 NOTE — Telephone Encounter (Signed)
Called and scheduled patient

## 2023-03-07 ENCOUNTER — Ambulatory Visit: Payer: 59 | Admitting: Obstetrics and Gynecology

## 2023-03-13 ENCOUNTER — Ambulatory Visit: Payer: 59 | Admitting: Orthopaedic Surgery

## 2023-03-19 ENCOUNTER — Other Ambulatory Visit: Payer: Self-pay | Admitting: Internal Medicine

## 2023-03-19 DIAGNOSIS — E785 Hyperlipidemia, unspecified: Secondary | ICD-10-CM

## 2023-04-18 ENCOUNTER — Other Ambulatory Visit: Payer: Self-pay | Admitting: Student

## 2023-04-18 DIAGNOSIS — E119 Type 2 diabetes mellitus without complications: Secondary | ICD-10-CM

## 2023-05-14 ENCOUNTER — Other Ambulatory Visit: Payer: Self-pay

## 2023-05-14 ENCOUNTER — Telehealth: Payer: Self-pay | Admitting: Orthopedic Surgery

## 2023-05-14 ENCOUNTER — Emergency Department (HOSPITAL_COMMUNITY): Payer: 59

## 2023-05-14 ENCOUNTER — Emergency Department (HOSPITAL_COMMUNITY)
Admission: EM | Admit: 2023-05-14 | Discharge: 2023-05-14 | Disposition: A | Discharge status: Home / Self Care | Payer: 59 | Attending: Emergency Medicine | Admitting: Emergency Medicine

## 2023-05-14 ENCOUNTER — Encounter (HOSPITAL_COMMUNITY): Payer: Self-pay

## 2023-05-14 DIAGNOSIS — Z7984 Long term (current) use of oral hypoglycemic drugs: Secondary | ICD-10-CM | POA: Insufficient documentation

## 2023-05-14 DIAGNOSIS — K429 Umbilical hernia without obstruction or gangrene: Secondary | ICD-10-CM | POA: Diagnosis not present

## 2023-05-14 DIAGNOSIS — Z79899 Other long term (current) drug therapy: Secondary | ICD-10-CM | POA: Diagnosis not present

## 2023-05-14 DIAGNOSIS — M169 Osteoarthritis of hip, unspecified: Secondary | ICD-10-CM | POA: Diagnosis not present

## 2023-05-14 DIAGNOSIS — R519 Headache, unspecified: Secondary | ICD-10-CM | POA: Insufficient documentation

## 2023-05-14 DIAGNOSIS — E1165 Type 2 diabetes mellitus with hyperglycemia: Secondary | ICD-10-CM | POA: Insufficient documentation

## 2023-05-14 DIAGNOSIS — Z91040 Latex allergy status: Secondary | ICD-10-CM | POA: Diagnosis not present

## 2023-05-14 DIAGNOSIS — Z7951 Long term (current) use of inhaled steroids: Secondary | ICD-10-CM | POA: Diagnosis not present

## 2023-05-14 DIAGNOSIS — R7401 Elevation of levels of liver transaminase levels: Secondary | ICD-10-CM | POA: Diagnosis not present

## 2023-05-14 DIAGNOSIS — D72829 Elevated white blood cell count, unspecified: Secondary | ICD-10-CM | POA: Diagnosis not present

## 2023-05-14 DIAGNOSIS — R112 Nausea with vomiting, unspecified: Secondary | ICD-10-CM | POA: Insufficient documentation

## 2023-05-14 DIAGNOSIS — J45909 Unspecified asthma, uncomplicated: Secondary | ICD-10-CM | POA: Diagnosis not present

## 2023-05-14 DIAGNOSIS — R1013 Epigastric pain: Secondary | ICD-10-CM | POA: Insufficient documentation

## 2023-05-14 DIAGNOSIS — M25552 Pain in left hip: Secondary | ICD-10-CM | POA: Diagnosis not present

## 2023-05-14 DIAGNOSIS — R109 Unspecified abdominal pain: Secondary | ICD-10-CM | POA: Diagnosis not present

## 2023-05-14 DIAGNOSIS — R42 Dizziness and giddiness: Secondary | ICD-10-CM | POA: Insufficient documentation

## 2023-05-14 DIAGNOSIS — I10 Essential (primary) hypertension: Secondary | ICD-10-CM | POA: Diagnosis not present

## 2023-05-14 DIAGNOSIS — Z794 Long term (current) use of insulin: Secondary | ICD-10-CM | POA: Diagnosis not present

## 2023-05-14 DIAGNOSIS — M1610 Unilateral primary osteoarthritis, unspecified hip: Secondary | ICD-10-CM

## 2023-05-14 DIAGNOSIS — K7689 Other specified diseases of liver: Secondary | ICD-10-CM | POA: Diagnosis not present

## 2023-05-14 DIAGNOSIS — R1032 Left lower quadrant pain: Secondary | ICD-10-CM | POA: Diagnosis not present

## 2023-05-14 DIAGNOSIS — K5730 Diverticulosis of large intestine without perforation or abscess without bleeding: Secondary | ICD-10-CM | POA: Diagnosis not present

## 2023-05-14 DIAGNOSIS — M160 Bilateral primary osteoarthritis of hip: Secondary | ICD-10-CM | POA: Diagnosis not present

## 2023-05-14 DIAGNOSIS — G8929 Other chronic pain: Secondary | ICD-10-CM | POA: Insufficient documentation

## 2023-05-14 LAB — URINALYSIS, ROUTINE W REFLEX MICROSCOPIC
Bilirubin Urine: NEGATIVE
Glucose, UA: NEGATIVE mg/dL
Hgb urine dipstick: NEGATIVE
Ketones, ur: NEGATIVE mg/dL
Leukocytes,Ua: NEGATIVE
Nitrite: NEGATIVE
Protein, ur: NEGATIVE mg/dL
Specific Gravity, Urine: 1.021 (ref 1.005–1.030)
pH: 6 (ref 5.0–8.0)

## 2023-05-14 LAB — COMPREHENSIVE METABOLIC PANEL WITH GFR
ALT: 107 U/L — ABNORMAL HIGH (ref 0–44)
AST: 128 U/L — ABNORMAL HIGH (ref 15–41)
Albumin: 3.3 g/dL — ABNORMAL LOW (ref 3.5–5.0)
Alkaline Phosphatase: 167 U/L — ABNORMAL HIGH (ref 38–126)
Anion gap: 13 (ref 5–15)
BUN: 21 mg/dL — ABNORMAL HIGH (ref 6–20)
CO2: 27 mmol/L (ref 22–32)
Calcium: 9.5 mg/dL (ref 8.9–10.3)
Chloride: 98 mmol/L (ref 98–111)
Creatinine, Ser: 0.84 mg/dL (ref 0.44–1.00)
GFR, Estimated: >60 mL/min
Glucose, Bld: 253 mg/dL — ABNORMAL HIGH (ref 70–99)
Potassium: 4.6 mmol/L (ref 3.5–5.1)
Sodium: 138 mmol/L (ref 135–145)
Total Bilirubin: 0.5 mg/dL (ref 0.3–1.2)
Total Protein: 7.7 g/dL (ref 6.5–8.1)

## 2023-05-14 LAB — LIPASE, BLOOD: Lipase: 37 U/L (ref 11–51)

## 2023-05-14 LAB — CBC
HCT: 36.2 % (ref 36.0–46.0)
Hemoglobin: 12 g/dL (ref 12.0–15.0)
MCH: 33.9 pg (ref 26.0–34.0)
MCHC: 33.1 g/dL (ref 30.0–36.0)
MCV: 102.3 fL — ABNORMAL HIGH (ref 80.0–100.0)
Platelets: 396 10*3/uL (ref 150–400)
RBC: 3.54 MIL/uL — ABNORMAL LOW (ref 3.87–5.11)
RDW: 13.3 % (ref 11.5–15.5)
WBC: 17.8 10*3/uL — ABNORMAL HIGH (ref 4.0–10.5)
nRBC: 0 % (ref 0.0–0.2)

## 2023-05-14 LAB — HCG, SERUM, QUALITATIVE: Preg, Serum: NEGATIVE

## 2023-05-14 MED ORDER — SODIUM CHLORIDE 0.9 % IV BOLUS
1000.0000 mL | Freq: Once | INTRAVENOUS | Status: AC
  Administered 2023-05-14: 1000 mL via INTRAVENOUS

## 2023-05-14 MED ORDER — DICYCLOMINE HCL 20 MG PO TABS: 20.0000 mg | ORAL_TABLET | Freq: Two times a day (BID) | ORAL | 0 refills | Status: DC

## 2023-05-14 MED ORDER — MORPHINE SULFATE (PF) 4 MG/ML IV SOLN: 4.0000 mg | Freq: Once | INTRAVENOUS | Status: DC

## 2023-05-14 MED ORDER — LIDOCAINE VISCOUS HCL 2 % MT SOLN
15.0000 mL | Freq: Once | OROMUCOSAL | Status: AC
  Administered 2023-05-14: 15 mL via ORAL
  Filled 2023-05-14: qty 15

## 2023-05-14 MED ORDER — ALUM & MAG HYDROXIDE-SIMETH 200-200-20 MG/5ML PO SUSP
30.0000 mL | Freq: Once | ORAL | Status: AC
  Administered 2023-05-14: 30 mL via ORAL
  Filled 2023-05-14: qty 30

## 2023-05-14 MED ORDER — IOHEXOL 350 MG/ML SOLN
75.0000 mL | Freq: Once | INTRAVENOUS | Status: AC | PRN
  Administered 2023-05-14: 75 mL via INTRAVENOUS

## 2023-05-14 MED ORDER — OXYCODONE HCL 5 MG PO TABS
10.0000 mg | ORAL_TABLET | Freq: Once | ORAL | Status: AC
  Administered 2023-05-14: 10 mg via ORAL
  Filled 2023-05-14: qty 2

## 2023-05-14 NOTE — ED Provider Notes (Signed)
[place] [facility]MENT AT [facility] Provider Note   CSN: [id number] Arrival date & time: 05/14/23  4098     History  Chief Complaint  Patient presents with   Headache   Hip Pain   Abdominal Pain    Hannah Young is a 57 y.o. female with past medical history seen for autoimmune hepatitis, asthma, hypertension, hyperlipidemia, diabetes, obesity, chronic arthritis, bipolar, hair loss who presents with concern for dizziness, intermittent headaches, intermittent blurry vision, feeling of near syncope for around 3 years, reports getting worse.  She also endorses epigastric pain, nausea, vomiting, intermittent constipation and foul-smelling stool for around 3 months.  She initially complained of shortness of breath in triage but denies any chest pain or shortness of breath when I spoke with patient.  Additionally endorsing left hip pain for 2 years, denies any new falls, injury, numbness, tingling, reports that she had been told by an orthopedic doctor she was going to need a knee replacement at some point.  When asked about acute changes today for multiple chronic problems she reports that she was feeling more dizzy and had an episode of foul-smelling stool followed by nausea and vomiting this morning.  Hip pain has been slowly worsening over time, but no significant acute change today.   Headache Associated symptoms: abdominal pain   Hip Pain Associated symptoms include abdominal pain and headaches.  Abdominal Pain      Home Medications Prior to Admission medications   Medication Sig Start Date End Date Taking? Authorizing Provider  dicyclomine (BENTYL) 20 MG tablet Take 1 tablet (20 mg total) by mouth 2 (two) times daily. 05/14/23  Yes Legion Discher H, PA-C  Accu-Chek Softclix Lancets lancets Use as instructed to check blood sugar 3x a day 08/25/22   Morene Crocker, MD  albuterol (VENTOLIN HFA) 108 (90 Base) MCG/ACT inhaler INHALE ONE TO TWO puffs into  THE lungs EVERY SIX HOURS AS NEEDED SHORTNESS OF BREATH 11/01/22   Morene Crocker, MD  blood glucose meter kit and supplies Dispense based on patient and insurance preference. Use up to four times daily as directed. (FOR ICD-10 E10.9, E11.9). 08/25/22   Gwenevere Abbot, MD  Blood Glucose Monitoring Suppl (ACCU-CHEK GUIDE ME) w/Device KIT Use 3 times per day to check your blood sugar. 08/25/22   Morene Crocker, MD  celecoxib (CELEBREX) 200 MG capsule Take 1 capsule (200 mg total) by mouth 2 (two) times daily. 02/02/22   Tarry Kos, MD  cetirizine (ZYRTEC) 5 MG tablet Take 1 tablet (5 mg total) by mouth daily. 01/25/23   Claudie Leach, DO  cyclobenzaprine (FLEXERIL) 10 MG tablet Take 1 tablet (10 mg total) by mouth 2 (two) times daily as needed for muscle spasms. 11/13/22   Cristie Hem, PA-C  fluticasone (FLONASE) 50 MCG/ACT nasal spray Place 2 sprays into both nostrils daily. 01/25/23   Gillermo Murdoch A, DO  glucose blood (ACCU-CHEK GUIDE) test strip Use as instructed to check blood sugar 3 times a day 08/25/22   Morene Crocker, MD  hydrOXYzine (ATARAX) 10 MG tablet Take 1 tablet (10 mg total) by mouth every 6 (six) hours as needed. 09/11/22   Morene Crocker, MD  insulin glargine (LANTUS SOLOSTAR) 100 UNIT/ML Solostar Pen Inject 15 Units into the skin at bedtime. 01/02/23   Quincy Simmonds, MD  insulin lispro (HUMALOG) 100 UNIT/ML injection Inject 0.05 mLs (5 Units total) into the skin 3 (three) times daily with meals. 01/02/23   Quincy Simmonds, MD  lisinopril (ZESTRIL) 10 MG tablet TAKE ONE TABLET BY MOUTH EVERY DAY 11/21/22   Morene Crocker, MD  mercaptopurine (PURINETHOL) 50 MG tablet Take 50 mg by mouth daily. Give on an empty stomach 1 hour before or 2 hours after meals. Caution: Chemotherapy.    [provider]  metFORMIN (GLUCOPHAGE-XR) 500 MG 24 hr tablet Take 2 tablets (1,000 mg total) by mouth 2 (two) times daily with a meal. 01/02/23 12/28/23   Quincy Simmonds, MD  omeprazole (PRILOSEC) 40 MG capsule Take 40 mg by mouth daily. 06/28/20   [provider]  oxyCODONE (OXY IR/ROXICODONE) 5 MG immediate release tablet Take 1 tablet (5 mg total) by mouth every 6 (six) hours as needed for severe pain or breakthrough pain. 12/26/22   Madelyn Brunner, DO  predniSONE (DELTASONE) 20 MG tablet Take 40 mg by mouth daily with breakfast.    [provider]  rosuvastatin (CRESTOR) 20 MG tablet Take 1 tablet (20 mg total) by mouth daily. 08/25/22 08/25/23  Gwenevere Abbot, MD  Semaglutide,0.25 or 0.5MG /DOS, (OZEMPIC, 0.25 OR 0.5 MG/DOSE,) 2 MG/3ML SOPN Inject 0.25 mg into the skin once a week. 01/02/23   Quincy Simmonds, MD  SURE COMFORT PEN NEEDLES 32G X 6 MM MISC USE ONE NEEDLE EVERY IN THE MORNING, NOON, AND EVERY EVENING 04/25/23   Morene Crocker, MD  triamcinolone cream (KENALOG) 0.1 % Apply 1 application topically 2 (two) times daily. 04/03/21   Ivette Loyal, NP  venlafaxine XR (EFFEXOR-XR) 75 MG 24 hr capsule Take 1 capsule (75 mg total) by mouth daily with breakfast. 01/02/23 12/28/23  Quincy Simmonds, MD      Allergies    Amoxicillin, Augmentin [amoxicillin-pot clavulanate], Tylenol [acetaminophen], and Latex    Review of Systems   Review of Systems  Gastrointestinal:  Positive for abdominal pain.  Neurological:  Positive for headaches.  All other systems reviewed and are negative.   Physical Exam Updated Vital Signs BP (!) 136/90 (BP Location: Right Arm)   Pulse 75   Temp 98 F (36.7 C)   Resp 15   Ht 5\' 1"  (1.549 m)   Wt 86.6 kg   SpO2 99%   BMI 36.07 kg/m  Physical Exam Vitals and nursing note reviewed.  Constitutional:      General: She is not in acute distress.    Appearance: Normal appearance.  HENT:     Head: Normocephalic and atraumatic.  Eyes:     General:        Right eye: No discharge.        Left eye: No discharge.  Cardiovascular:     Rate and Rhythm: Normal rate and regular rhythm.     Heart  sounds: No murmur heard.    No friction rub. No gallop.  Pulmonary:     Effort: Pulmonary effort is normal.     Breath sounds: Normal breath sounds.  Abdominal:     General: Bowel sounds are normal.     Palpations: Abdomen is soft.     Comments: Patient with some focal tenderness to palpation, worse in the epigastric region, no rebound, rigidity, guarding throughout.  Skin:    General: Skin is warm and dry.     Capillary Refill: Capillary refill takes less than 2 seconds.  Neurological:     Mental Status: She is alert and oriented to person, place, and time.     Comments: Cranial nerves II through XII grossly intact.  Intact finger-nose, intact heel-to-shin.  Romberg negative, gait normal.  Alert and oriented x3.  Moves all 4 limbs spontaneously, normal coordination.  No pronator drift.  Intact strength 5 out of 5 bilateral upper and lower extremities.    Psychiatric:        Mood and Affect: Mood normal.        Behavior: Behavior normal.     ED Results / Procedures / Treatments   Labs (all labs ordered are listed, but only abnormal results are displayed) Labs Reviewed  COMPREHENSIVE METABOLIC PANEL - Abnormal; Notable for the following components:      Result Value   Glucose, Bld 253 (*)    BUN 21 (*)    Albumin 3.3 (*)    AST 128 (*)    ALT 107 (*)    Alkaline Phosphatase 167 (*)    All other components within normal limits  CBC - Abnormal; Notable for the following components:   WBC 17.8 (*)    RBC 3.54 (*)    MCV 102.3 (*)    All other components within normal limits  LIPASE, BLOOD  URINALYSIS, ROUTINE W REFLEX MICROSCOPIC  HCG, SERUM, QUALITATIVE    EKG EKG Interpretation Date/Time:  Monday May 14 2023 09:53:48 EDT Ventricular Rate:  70 PR Interval:  128 QRS Duration:  92 QT Interval:  390 QTC Calculation: 421 R Axis:   67  Text Interpretation: Normal sinus rhythm Possible Anterior infarct , age undetermined Abnormal ECG When compared with ECG of  27-May-2022 10:56, PREVIOUS ECG IS PRESENT when compared to prior, similar appearnce No STEMI Confirmed by Theda Belfast ([id number]) on 05/14/2023 10:45:14 AM  Radiology CT ABDOMEN PELVIS W CONTRAST  Result Date: 05/14/2023 CLINICAL DATA:  Abdominal pain, acute, nonlocalized EXAM: CT ABDOMEN AND PELVIS WITH CONTRAST TECHNIQUE: Multidetector CT imaging of the abdomen and pelvis was performed using the standard protocol following bolus administration of intravenous contrast. RADIATION DOSE REDUCTION: This exam was performed according to the departmental dose-optimization program which includes automated exposure control, adjustment of the mA and/or kV according to patient size and/or use of iterative reconstruction technique. CONTRAST:  75mL OMNIPAQUE IOHEXOL 350 MG/ML SOLN COMPARISON:  CT scan abdomen and pelvis from 01/16/2018. FINDINGS: Lower chest: There are subpleural atelectatic changes in the visualized lung bases. No overt consolidation. No pleural effusion. The heart is normal in size. No pericardial effusion. Hepatobiliary: The liver is normal in size. There is subtle surface irregularity/nodularity, concerning for cirrhosis. Correlate clinically. No suspicious mass. No intrahepatic or extrahepatic bile duct dilation. No calcified gallstones. Normal gallbladder wall thickness. No pericholecystic inflammatory changes. Pancreas: Unremarkable. No pancreatic ductal dilatation or surrounding inflammatory changes. Spleen: Within normal limits. No focal lesion. Adrenals/Urinary Tract: Adrenal glands are unremarkable. No suspicious renal mass. No hydronephrosis. No renal or ureteric calculi. Unremarkable urinary bladder. Stomach/Bowel: No disproportionate dilation of the small or large bowel loops. No evidence of abnormal bowel wall thickening or inflammatory changes. The appendix is unremarkable. There are innumerable diverticula throughout the colon, without imaging signs of diverticulitis. Vascular/Lymphatic: No  ascites or pneumoperitoneum. No abdominal or pelvic lymphadenopathy, by size criteria. No aneurysmal dilation of the major abdominal arteries. There are mild peripheral atherosclerotic vascular calcifications of the aorta and its major branches. Reproductive: The uterus is unremarkable. No large adnexal mass. Other: There is a tiny fat containing umbilical hernia. The soft tissues and abdominal wall are otherwise unremarkable. Musculoskeletal: No suspicious osseous lesions. There are mild multilevel degenerative changes in the visualized spine. IMPRESSION: 1. No acute inflammatory process identified within the abdomen or pelvis.  2. Colonic diverticulosis without imaging signs of diverticulitis. 3. Subtle liver surface irregularity/nodularity, concerning for cirrhosis. Correlate clinically. 4. Multiple other nonacute observations, as described above. Aortic Atherosclerosis (ICD10-I70.0). Electronically Signed   By: Jules Schick M.D.   On: 05/14/2023 14:52   CT Head Wo Contrast  Result Date: 05/14/2023 CLINICAL DATA:  57 year old female with abdominal pain, neurologic deficit. Vision changes. Headache. EXAM: CT HEAD WITHOUT CONTRAST TECHNIQUE: Contiguous axial images were obtained from the base of the skull through the vertex without intravenous contrast. RADIATION DOSE REDUCTION: This exam was performed according to the departmental dose-optimization program which includes automated exposure control, adjustment of the mA and/or kV according to patient size and/or use of iterative reconstruction technique. COMPARISON:  Head CT 05/27/2022.  Brain MRI 06/22/2005. FINDINGS: Brain: Cerebral volume not significantly changed over this series of exams, within normal limits. No midline shift, ventriculomegaly, mass effect, evidence of mass lesion, intracranial hemorrhage or evidence of cortically based acute infarction. Gray-white matter differentiation is within normal limits for age. No cortical encephalomalacia  identified. Vascular: Calcified atherosclerosis at the skull base. No suspicious intracranial vascular hyperdensity. Skull: No acute osseous abnormality identified. Sinuses/Orbits: Chronic left maxillary mucoperiosteal thickening. Mild bubbly opacity there similar to last year. Other Visualized paranasal sinuses and mastoids are stable and well aerated. Tympanic cavities appear clear. Other: Visualized orbit soft tissues are within normal limits. Visualized scalp soft tissues are within normal limits. IMPRESSION: 1. No acute intracranial abnormality. Negative for age noncontrast CT appearance of the brain. 2. Chronic left maxillary sinus disease. Electronically Signed   By: Odessa Fleming M.D.   On: 05/14/2023 13:26   DG Hip Unilat W or Wo Pelvis 2-3 Views Left  Result Date: 05/14/2023 CLINICAL DATA:  Chronic left hip pain. EXAM: DG HIP (WITH OR WITHOUT PELVIS) 2-3V LEFT COMPARISON:  01/04/2023. FINDINGS: Pelvis is intact with normal and symmetric sacroiliac joints. No acute fracture or dislocation. No aggressive osseous lesion. Visualized sacral arcuate lines are unremarkable. Unremarkable symphysis pubis. There are moderate to severe degenerative changes of the left hip joint in the form of reduced joint space, subchondral sclerosis / cystic changes and osteophytosis. There are mild-to-moderate degenerative changes of right hip joint. No radiopaque foreign bodies. IMPRESSION: 1. Moderate to severe left hip osteoarthritis. Electronically Signed   By: Jules Schick M.D.   On: 05/14/2023 11:57    Procedures Procedures    Medications Ordered in ED Medications  sodium chloride 0.9 % bolus 1,000 mL (0 mLs Intravenous Stopped 05/14/23 1240)  alum & mag hydroxide-simeth (MAALOX/MYLANTA) 200-200-20 MG/5ML suspension 30 mL (30 mLs Oral Given 05/14/23 1106)    And  lidocaine (XYLOCAINE) 2 % viscous mouth solution 15 mL (15 mLs Oral Given 05/14/23 1106)  iohexol (OMNIPAQUE) 350 MG/ML injection 75 mL (75 mLs Intravenous  Contrast Given 05/14/23 1216)  oxyCODONE (Oxy IR/ROXICODONE) immediate release tablet 10 mg (10 mg Oral Given 05/14/23 1253)    ED Course/ Medical Decision Making/ A&P                                 Medical Decision Making Amount and/or Complexity of Data Reviewed Labs: ordered. Radiology: ordered. ECG/medicine tests: ordered.  Risk OTC drugs. Prescription drug management.   This patient is a 57 y.o. female who presents to the ED for concern of complaints including intermittent vision problems, dizziness, lightheadedness, acute on chronic abdominal pain, as well as acute on chronic left hip pain,  this involves an extensive number of treatment options, and is a complaint that carries with it a high risk of complications and morbidity. The emergent differential diagnosis prior to evaluation includes, but is not limited to,  The causes of generalized abdominal pain include but are not limited to AAA, mesenteric ischemia, appendicitis, diverticulitis, DKA, gastritis, gastroenteritis, AMI, nephrolithiasis, pancreatitis, peritonitis, adrenal insufficiency,lead poisoning, iron toxicity, intestinal ischemia, constipation, UTI,SBO/LBO, splenic rupture, biliary disease, IBD, IBS, PUD, or hepatitis, for her dizziness, headache considered: Stroke, increased ICP, meningitis, CVA, intracranial tumor, venous sinus thrombosis, migraine, cluster headache, hypertension, drug related, head injury, tension headache, sinusitis, dental abscess, otitis media, TMJ . This is not an exhaustive differential.   Past Medical History / Co-morbidities / Social History: autoimmune hepatitis, asthma, hypertension, hyperlipidemia, diabetes, obesity, chronic arthritis, bipolar, hair loss   Additional history: Chart reviewed. Pertinent results include: Reviewed lab work, imaging from previous emergency department visits, urgent care visits, outpatient orthopedic visits  Physical Exam: Physical exam performed. The pertinent  findings include: Patient with some focal tenderness to palpation, worse in the epigastric region, no rebound, rigidity, guarding throughout.   Cranial nerves II through XII grossly intact.  Intact finger-nose, intact heel-to-shin.  Romberg negative, gait normal.  Alert and oriented x3.  Moves all 4 limbs spontaneously, normal coordination.  No pronator drift.  Intact strength 5 out of 5 bilateral upper and lower extremities.  Some tenderness to palpation over the left hip with overall normal range of motion, follows commands to raise leg, but slightly slow secondary to pain.  Neurovascularly intact throughout.  Lab Tests: I ordered, and personally interpreted labs.  The pertinent results include: CBC is notable for a moderate leukocytosis, white blood cell 17.8.  TB secondary to pain, stress, but will obtain imaging of the head, abdomen to further elucidate.  CMP notable for moderate hyperglycemia, glucose 253.  She has chronically elevated liver enzymes, AST 128, ALT 107 alk phos 167 likely secondary to her autoimmune hepatitis.  UA unremarkable.   Imaging Studies: I ordered imaging studies and independently interpreted them. including CT head without contrast shows some chronic left-sided sinus disease, otherwise unremarkable.  Plain film radiograph of the left hip shows moderate to severe osteoarthritis, otherwise no acute fracture, dislocation or other abnormality.  CT abdomen pelvis shows nodularity of liver, as well as some colonic diverticulosis without diverticulitis..  I agree with the radiologist interpretation.   Cardiac Monitoring:  The patient was maintained on a cardiac monitor.  My attending physician Dr. Rush Landmark viewed and interpreted the cardiac monitored which showed an underlying rhythm of: NSR. I agree with this interpretation.   Medications: I ordered medication including GI cocktail for abdominal pain, oxycodone for hip pain. Reevaluation of the patient after these medicines  showed that the patient improved. I have reviewed the patients home medicines and have made adjustments as needed.   Disposition: After consideration of the diagnostic results and the patients response to treatment, I feel that patient with chronic abdominal pain, chronic headache, as well as chronic left hip pain, no evidence of acute change today requiring surgical or hospital management, discussed I would recommend a close neurology follow-up regarding her lightheadedness, intermittent headache, she should follow-up with her GI doctor regarding her abdominal pain, as well as her orthopedic doctor to discuss hip and knee replacement which she reports that she had already started that process.   emergency department workup does not suggest an emergent condition requiring admission or immediate intervention beyond what has been performed  at this time. The plan is: as above. The patient is safe for discharge and has been instructed to return immediately for worsening symptoms, change in symptoms or any other concerns.  Final Clinical Impression(s) / ED Diagnoses Final diagnoses:  Arthritis of hip  Chronic abdominal pain  Acute nonintractable headache, unspecified headache type  Dizziness    Rx / DC Orders ED Discharge Orders          Ordered    dicyclomine (BENTYL) 20 MG tablet  2 times daily        05/14/23 1504    Ambulatory referral to Neurology       Comments: An appointment is requested in approximately: 4 weeks   05/14/23 1504              Olene Floss, PA-C 05/14/23 1507    Tegeler, Canary Brim, MD 05/14/23 [phone]

## 2023-05-14 NOTE — Discharge Instructions (Addendum)
Please follow-up with your GI doctor and orthopedic doctor regarding your hip pain and abdominal pain.  I have placed a referral to neurology so you can follow-up on your headache and dizziness.  Please return to the emergency department if you have severe worsening of headache, dizziness, balance issues.

## 2023-05-14 NOTE — ED Triage Notes (Signed)
Pt c/o HA for years. Pt states she has int blackness with vision for a year and is getting worse. Pt c/o epigastric pain, N/V, SOB for years and last 3 mos got worse. Pt c/o left hip painx18yrs. PT denies numbness and tingling.

## 2023-05-14 NOTE — Telephone Encounter (Signed)
Patient called and wanted to know can she get the Gel shots in her back? CB#[phone]

## 2023-05-15 ENCOUNTER — Other Ambulatory Visit: Payer: Self-pay | Admitting: Radiology

## 2023-05-15 DIAGNOSIS — M1612 Unilateral primary osteoarthritis, left hip: Secondary | ICD-10-CM

## 2023-05-15 NOTE — Telephone Encounter (Signed)
I called and spoke with pt and she would like to get an injection in her hip again. I placed order for hip injection

## 2023-05-22 ENCOUNTER — Ambulatory Visit (INDEPENDENT_AMBULATORY_CARE_PROVIDER_SITE_OTHER): Payer: 59 | Admitting: Podiatry

## 2023-05-22 DIAGNOSIS — Z91199 Patient's noncompliance with other medical treatment and regimen due to unspecified reason: Secondary | ICD-10-CM

## 2023-05-22 NOTE — Progress Notes (Signed)
No show

## 2023-05-25 ENCOUNTER — Ambulatory Visit: Payer: 59 | Admitting: Orthopaedic Surgery

## 2023-05-28 DIAGNOSIS — E113292 Type 2 diabetes mellitus with mild nonproliferative diabetic retinopathy without macular edema, left eye: Secondary | ICD-10-CM | POA: Diagnosis not present

## 2023-05-28 LAB — HM DIABETES EYE EXAM

## 2023-06-01 ENCOUNTER — Encounter: Payer: Self-pay | Admitting: Student

## 2023-06-01 ENCOUNTER — Ambulatory Visit: Payer: 59 | Admitting: Student

## 2023-06-01 ENCOUNTER — Telehealth: Payer: Self-pay | Admitting: *Deleted

## 2023-06-01 ENCOUNTER — Other Ambulatory Visit: Payer: Self-pay | Admitting: Internal Medicine

## 2023-06-01 VITALS — BP 145/91 | HR 70 | Temp 97.9°F | Wt 182.5 lb

## 2023-06-01 DIAGNOSIS — E669 Obesity, unspecified: Secondary | ICD-10-CM

## 2023-06-01 DIAGNOSIS — E785 Hyperlipidemia, unspecified: Secondary | ICD-10-CM

## 2023-06-01 DIAGNOSIS — M1612 Unilateral primary osteoarthritis, left hip: Secondary | ICD-10-CM | POA: Diagnosis not present

## 2023-06-01 DIAGNOSIS — R1013 Epigastric pain: Secondary | ICD-10-CM | POA: Diagnosis not present

## 2023-06-01 DIAGNOSIS — G4733 Obstructive sleep apnea (adult) (pediatric): Secondary | ICD-10-CM

## 2023-06-01 DIAGNOSIS — Z87891 Personal history of nicotine dependence: Secondary | ICD-10-CM | POA: Diagnosis not present

## 2023-06-01 DIAGNOSIS — Z794 Long term (current) use of insulin: Secondary | ICD-10-CM | POA: Diagnosis not present

## 2023-06-01 DIAGNOSIS — K754 Autoimmune hepatitis: Secondary | ICD-10-CM | POA: Diagnosis not present

## 2023-06-01 DIAGNOSIS — E119 Type 2 diabetes mellitus without complications: Secondary | ICD-10-CM | POA: Diagnosis not present

## 2023-06-01 DIAGNOSIS — Z7985 Long-term (current) use of injectable non-insulin antidiabetic drugs: Secondary | ICD-10-CM | POA: Diagnosis not present

## 2023-06-01 DIAGNOSIS — I10 Essential (primary) hypertension: Secondary | ICD-10-CM

## 2023-06-01 DIAGNOSIS — Z7952 Long term (current) use of systemic steroids: Secondary | ICD-10-CM | POA: Diagnosis not present

## 2023-06-01 LAB — POCT GLYCOSYLATED HEMOGLOBIN (HGB A1C): Hemoglobin A1C: 8.3 % — AB (ref 4.0–5.6)

## 2023-06-01 LAB — GLUCOSE, CAPILLARY: Glucose-Capillary: 200 mg/dL — ABNORMAL HIGH (ref 70–99)

## 2023-06-01 MED ORDER — BLOOD GLUCOSE METER KIT
PACK | 0 refills | Status: AC
Start: 2023-06-01 — End: ?

## 2023-06-01 MED ORDER — OZEMPIC (0.25 OR 0.5 MG/DOSE) 2 MG/3ML ~~LOC~~ SOPN: 0.2500 mg | PEN_INJECTOR | SUBCUTANEOUS | 11 refills | Status: DC

## 2023-06-01 MED ORDER — BLOOD GLUCOSE METER KIT: PACK | 0 refills | Status: DC

## 2023-06-01 MED ORDER — DEXCOM G7 SENSOR MISC
3 refills | Status: DC
Start: 2023-06-01 — End: 2023-09-24

## 2023-06-01 MED ORDER — PANTOPRAZOLE SODIUM 40 MG PO TBEC
40.0000 mg | DELAYED_RELEASE_TABLET | Freq: Two times a day (BID) | ORAL | 11 refills | Status: DC
Start: 2023-06-01 — End: 2024-06-04

## 2023-06-01 NOTE — Assessment & Plan Note (Signed)
Patient has been on prednisone for greater than 1 year per patient.  This is per autoimmune hepatitis, she follows with [facility] for her GI needs.  She would benefit from closer follow-up with her GI doctor to see if there are any alternative medications for her condition.  Recent DEXA scan demonstrated osteopenia, would be important to continue to monitor this patient for complications of long-term steroid use.

## 2023-06-01 NOTE — Progress Notes (Unsigned)
  Care Coordination  Outreach Note  06/01/2023 Name: TONA SKUPIEN MRN: [id number] DOB: 12-31-1965   Care Coordination Outreach Attempts: An unsuccessful telephone outreach was attempted today to offer the patient information about available care coordination services.  Follow Up Plan:  Additional outreach attempts will be made to offer the patient care coordination information and services.   Encounter Outcome:  No Answer  Gwenevere Ghazi  Care Coordination Care Guide  Direct Dial: [phone]

## 2023-06-01 NOTE — Assessment & Plan Note (Signed)
Hypertensive to 145/91 today, patient did not take her medication-urged patient to take her medication before appointments in the future.

## 2023-06-01 NOTE — Assessment & Plan Note (Addendum)
Her last lipid panel 09/02/2022 showed an LDL of 121, above goal for this patient with diabetes.  Plan: Repeat lipid panel today Will call [facility] to see if patient can can get pill packs

## 2023-06-01 NOTE — Assessment & Plan Note (Addendum)
On Prednisone and Mercaptopurine.  She follows with [facility] for her autoimmune hepatitis, as well as [facility].  She previously was a patient of [facility], was fired from the practice for noncompliance.  She has a difficult social situation, tried to take care of many other people in her family.  She often puts their needs above hers.  She would benefit from following more closely with her GI doctors for her ongoing autoimmune hepatitis and epigastric pain, as well as to see if there are any other options for immunosuppression in this patient besides prednisone.  Recent ED visit demonstrated persistently elevated LFTs, abnormal appearance of liver on CT scan.

## 2023-06-01 NOTE — Patient Instructions (Addendum)
Thank you, Ms.[name] for allowing [place] to provide your care today.  I have ordered the following tests for you:   Lab Orders         Glucose, capillary         Lipid Profile         POC Hbg A1C       Referrals ordered today:    Referral Orders         Referral to Nutrition and Diabetes Services         Ambulatory referral to Sleep Studies         AMB Referral to [facility] Care Management      I have ordered the following medication/changed the following medications:   Start the following medications: Meds ordered this encounter  Medications   DISCONTD: blood glucose meter kit and supplies    Sig: Dispense based on patient and insurance preference. Use up to four times daily as directed. (FOR ICD-10 E10.9, E11.9).    Dispense:  1 each    Refill:  0    Order Specific Question:   Number of strips    Answer:   150    Order Specific Question:   Number of lancets    Answer:   150   Continuous Glucose Sensor (DEXCOM G7 SENSOR) MISC    Sig: Apply once every 10 day as instructed    Dispense:  4 each    Refill:  3   Semaglutide,0.25 or 0.5MG /DOS, (OZEMPIC, 0.25 OR 0.5 MG/DOSE,) 2 MG/3ML SOPN    Sig: Inject 0.25 mg into the skin once a week.    Dispense:  3 mL    Refill:  11   blood glucose meter kit and supplies    Sig: Dispense based on patient and insurance preference. Use up to four times daily as directed. (FOR ICD-10 E10.9, E11.9).    Dispense:  1 each    Refill:  0    Order Specific Question:   Number of strips    Answer:   150    Order Specific Question:   Number of lancets    Answer:   150     Follow up:  4 weeks  for close follow up on chronic conditions    We look forward to seeing you next time. Please call our clinic at [phone] if you have any questions or concerns. The best time to call is Monday-Friday from 9am-4pm, but there is someone available 24/7. If after hours or the weekend, call the main hospital number and ask for the Internal Medicine  Resident On-Call. If you need medication refills, please notify your pharmacy one week in advance and they will send [place] a request.   Thank you for trusting me with your care. Wishing you the best!  Lovie Macadamia MD [facility]

## 2023-06-01 NOTE — Progress Notes (Signed)
Subjective:  CC: Follow-up on multiple chronic conditions, diabetes, epigastric pain, osteoarthritis, and autoimmune hepatitis  HPI:  Ms.Karine OTILIA KARLEN is a 57 y.o. person with a past medical history stated below and presents today for the above concerns. Please see problem based assessment and plan for additional details.  Past Medical History:  Diagnosis Date   Anxiety    Arthritis    Asthma    Autoimmune hepatitis (HCC)    Bipolar 1 disorder (HCC)    Breast lump    Depression    Diabetes mellitus    FH: chemotherapy    Gout    Hair loss 10/21/2020   Headache 02/01/2021   High cholesterol    Housing situation unstable 03/22/2021   Hypertension    Liver cirrhosis (HCC)    Lung nodules    Neuropathy    Panic disorder    STD exposure 02/23/2021   Vaginal discharge 01/25/2017   Vertigo     Current Outpatient Medications on File Prior to Visit  Medication Sig Dispense Refill   Accu-Chek Softclix Lancets lancets Use as instructed to check blood sugar 3x a day 300 each 3   albuterol (VENTOLIN HFA) 108 (90 Base) MCG/ACT inhaler INHALE ONE TO TWO puffs into THE lungs EVERY SIX HOURS AS NEEDED SHORTNESS OF BREATH 6.7 g 12   Blood Glucose Monitoring Suppl (ACCU-CHEK GUIDE ME) w/Device KIT Use 3 times per day to check your blood sugar. 1 kit 0   fluticasone (FLONASE) 50 MCG/ACT nasal spray Place 2 sprays into both nostrils daily. 16 mL 2   glucose blood (ACCU-CHEK GUIDE) test strip Use as instructed to check blood sugar 3 times a day 300 each 3   insulin glargine (LANTUS SOLOSTAR) 100 UNIT/ML Solostar Pen Inject 15 Units into the skin at bedtime. 15 mL 3   insulin lispro (HUMALOG) 100 UNIT/ML injection Inject 0.05 mLs (5 Units total) into the skin 3 (three) times daily with meals. 10 mL 11   lisinopril (ZESTRIL) 10 MG tablet TAKE ONE TABLET BY MOUTH EVERY DAY 90 tablet 3   mercaptopurine (PURINETHOL) 50 MG tablet Take 50 mg by mouth daily. Give on an empty stomach 1 hour  before or 2 hours after meals. Caution: Chemotherapy.     metFORMIN (GLUCOPHAGE-XR) 500 MG 24 hr tablet Take 2 tablets (1,000 mg total) by mouth 2 (two) times daily with a meal. 360 tablet 3   oxyCODONE (OXY IR/ROXICODONE) 5 MG immediate release tablet Take 1 tablet (5 mg total) by mouth every 6 (six) hours as needed for severe pain or breakthrough pain. 20 tablet 0   predniSONE (DELTASONE) 20 MG tablet Take 40 mg by mouth daily with breakfast.     rosuvastatin (CRESTOR) 20 MG tablet Take 1 tablet (20 mg total) by mouth daily. 30 tablet 11   SURE COMFORT PEN NEEDLES 32G X 6 MM MISC USE ONE NEEDLE EVERY IN THE MORNING, NOON, AND EVERY EVENING 100 each 2   venlafaxine XR (EFFEXOR-XR) 75 MG 24 hr capsule Take 1 capsule (75 mg total) by mouth daily with breakfast. 30 capsule 11   No current facility-administered medications on file prior to visit.    Review of Systems: Please see assessment and plan for pertinent positives and negatives.  Objective:   Vitals:   06/01/23 0953 06/01/23 1042  BP: (!) 140/94 (!) 145/91  Pulse: 76 70  Temp: 97.9 F (36.6 C)   TempSrc: Oral   SpO2: 100%   Weight: 182 lb  8 oz (82.8 kg)     Physical Exam: Constitutional: Tired-appearing, in no acute distress Cardiovascular: regular rate and rhythm, no m/r/g Pulmonary/Chest: normal work of breathing on room air, lungs clear to auscultation bilaterally Abdominal: Epigastric pain to palpation, no right upper quadrant pain. Extremities: No edema of the lower extremities bilaterally Skin: warm and dry Psych: Pleasant affect, anxious mood   Assessment & Plan:  On prednisone therapy Patient has been on prednisone for greater than 1 year per patient.  This is per autoimmune hepatitis, she follows with [facility] for her GI needs.  She would benefit from closer follow-up with her GI doctor to see if there are any alternative medications for her condition.  Recent DEXA scan demonstrated osteopenia, would be  important to continue to monitor this patient for complications of long-term steroid use.  HLD (hyperlipidemia) Her last lipid panel 09/02/2022 showed an LDL of 121, above goal for this patient with diabetes.  Plan: Repeat lipid panel today Will call [facility] to see if patient can can get pill packs  Primary osteoarthritis of left hip Patient endorses ongoing pain from hip, knee, and back pain.  Her hip pain is most bothersome.  She does follow with orthopedics and would benefit from total hip replacement, though her surgery is contingent upon her ability to get her diabetes and A1c under control.  She is not sure what A1c she will need in order to undergo surgery.  She will reach out to her orthopedics office to learn more about her surgical candidacy.  She had a upcoming hip steroid injection which she had to cancel due to a death of a loved one, and family events.  Plan: Patient to reach out to orthopedist.  Autoimmune hepatitis (HCC) On Prednisone and Mercaptopurine.  She follows with [facility] for her autoimmune hepatitis, as well as [facility].  She previously was a patient of [facility], was fired from the practice for noncompliance.  She has a difficult social situation, tried to take care of many other people in her family.  She often puts their needs above hers.  She would benefit from following more closely with her GI doctors for her ongoing autoimmune hepatitis and epigastric pain, as well as to see if there are any other options for immunosuppression in this patient besides prednisone.  Recent ED visit demonstrated persistently elevated LFTs, abnormal appearance of liver on CT scan.  Hypertension Hypertensive to 145/91 today, patient did not take her medication-urged patient to take her medication before appointments in the future.  Type 2 diabetes mellitus without complication, with long-term current use of insulin (HCC) Patient's A1c increased from 7.0 last visit to  8.2 at this visit.  She takes 15 units of Lantus daily, and takes her Humalog based on her symptoms.  She says that her glucometer is broken and she is not checking her sugars.  She states that she takes her Humalog when she feels symptomatic from hyperglycemia or is experiencing polyuria and polydipsia.  She is also on metformin 1 g twice daily.  She was previously on Ozempic 0.25, but it is unclear whether she has been taking this medication, and how long she has been taking it for.  She had a CGM in the past, but it fell off after 2 days and she stopped using it.  Patient states that they have difficulty medication management, sometimes forgetting that medications are taking them twice at other times.  She says she is on some new  medications that she has lost track of which she takes.  I have gone ahead and trimmed down her medications, and reach out to [facility] pharmacy to get pill packs for this patient.  I think she would likely benefit from being on CGM again and she has a difficult time tracking her sugars for her mealtime insulin doses.  I went ahead and prescribed her Dexcom G7 for this.  We will go ahead and resume Ozempic 0.25 mg weekly, and can uptitrate at future visits.  She will continue her metformin 1 g twice daily.  I will put in a referral to try and have the community care coordinator reach out to her again to help with medication management as well as have her follow-up with Lupita Leash for training on her CGM if possible.  Plan: CGM Resume Ozempic 0.25 mg daily Referral to diabetic nutrition education Pill packs ordered for this patient Close follow-up with [place] Encouraged patient if nothing else to follow-up with [place] in 1 month so we may continue to monitor and find additional resources to help her.   Epigastric pain Recently seen in the ED for epigastric pain, workup relatively unremarkable at that time.  She was on a PPI but not taking this with any consistency.  She was given a  prescription for Bentyl, which she is not taking anymore.  She does endorse GERD like symptoms, epigastric pain, currently denies any red flag symptoms.  Plan: Pantoprazole 40 mg twice daily   OSA (obstructive sleep apnea) Patient with signs and symptoms of OSA such as snoring, frequent awakenings, and daytime sleepiness.  She feels lethargic throughout the day, especially at midday.  She likely has sleep apnea and would benefit greatly from CPAP if she does. Plan: Sleep study    Patient seen with Dr. Willow Ora MD [facility]  PGY-1 Pager: [phone]  Phone: [phone] Date 06/01/2023  Time 2:51 PM

## 2023-06-01 NOTE — Assessment & Plan Note (Signed)
Patient with signs and symptoms of OSA such as snoring, frequent awakenings, and daytime sleepiness.  She feels lethargic throughout the day, especially at midday.  She likely has sleep apnea and would benefit greatly from CPAP if she does. Plan: Sleep study

## 2023-06-01 NOTE — Assessment & Plan Note (Addendum)
Patient endorses ongoing pain from hip, knee, and back pain.  Her hip pain is most bothersome.  She does follow with orthopedics and would benefit from total hip replacement, though her surgery is contingent upon her ability to get her diabetes and A1c under control.  She is not sure what A1c she will need in order to undergo surgery.  She will reach out to her orthopedics office to learn more about her surgical candidacy.  She had a upcoming hip steroid injection which she had to cancel due to a death of a loved one, and family events.  Plan: Patient to reach out to orthopedist.

## 2023-06-01 NOTE — Assessment & Plan Note (Signed)
Patient's A1c increased from 7.0 last visit to 8.2 at this visit.  She takes 15 units of Lantus daily, and takes her Humalog based on her symptoms.  She says that her glucometer is broken and she is not checking her sugars.  She states that she takes her Humalog when she feels symptomatic from hyperglycemia or is experiencing polyuria and polydipsia.  She is also on metformin 1 g twice daily.  She was previously on Ozempic 0.25, but it is unclear whether she has been taking this medication, and how long she has been taking it for.  She had a CGM in the past, but it fell off after 2 days and she stopped using it.  Patient states that they have difficulty medication management, sometimes forgetting that medications are taking them twice at other times.  She says she is on some new medications that she has lost track of which she takes.  I have gone ahead and trimmed down her medications, and reach out to [facility] pharmacy to get pill packs for this patient.  I think she would likely benefit from being on CGM again and she has a difficult time tracking her sugars for her mealtime insulin doses.  I went ahead and prescribed her Dexcom G7 for this.  We will go ahead and resume Ozempic 0.25 mg weekly, and can uptitrate at future visits.  She will continue her metformin 1 g twice daily.  I will put in a referral to try and have the community care coordinator reach out to her again to help with medication management as well as have her follow-up with Lupita Leash for training on her CGM if possible.  Plan: CGM Resume Ozempic 0.25 mg daily Referral to diabetic nutrition education Pill packs ordered for this patient Close follow-up with [place] Encouraged patient if nothing else to follow-up with [place] in 1 month so we may continue to monitor and find additional resources to help her.

## 2023-06-01 NOTE — Assessment & Plan Note (Signed)
Recently seen in the ED for epigastric pain, workup relatively unremarkable at that time.  She was on a PPI but not taking this with any consistency.  She was given a prescription for Bentyl, which she is not taking anymore.  She does endorse GERD like symptoms, epigastric pain, currently denies any red flag symptoms.  Plan: Pantoprazole 40 mg twice daily

## 2023-06-03 LAB — LIPID PANEL
Chol/HDL Ratio: 2.7 {ratio} (ref 0.0–4.4)
Cholesterol, Total: 215 mg/dL — ABNORMAL HIGH (ref 100–199)
HDL: 79 mg/dL (ref >39)
LDL Chol Calc (NIH): 120 mg/dL — ABNORMAL HIGH (ref 0–99)
Triglycerides: 89 mg/dL (ref 0–149)
VLDL Cholesterol Cal: 16 mg/dL (ref 5–40)

## 2023-06-04 IMAGING — CT CT HEAD W/O CM
3 series · 14 of 47 positions shown, 16 images · non-contrast
Comparison: 12/15/2019

CLINICAL DATA: Migraine headaches, epistaxis, history of liver
cancer

EXAM:
CT HEAD WITHOUT CONTRAST
TECHNIQUE: Contiguous axial images were obtained from the base of the skull
through the vertex without intravenous contrast.

[Series 2: head wo · axial · 0.47mm/px · z∈[-138,-8]mm · 8 of 32 slices shown, 10 images]
[im 3/32  brain]
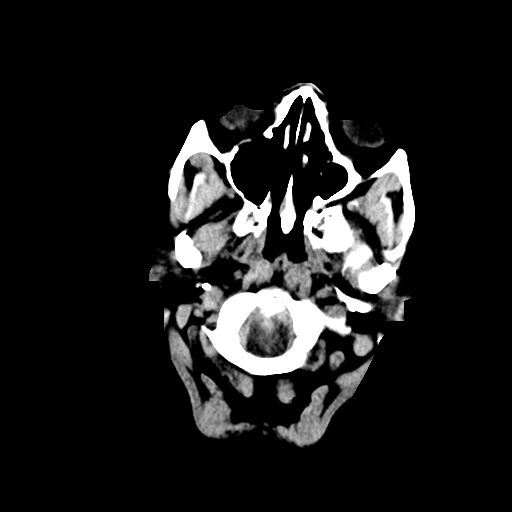
[im 3/32  bone]
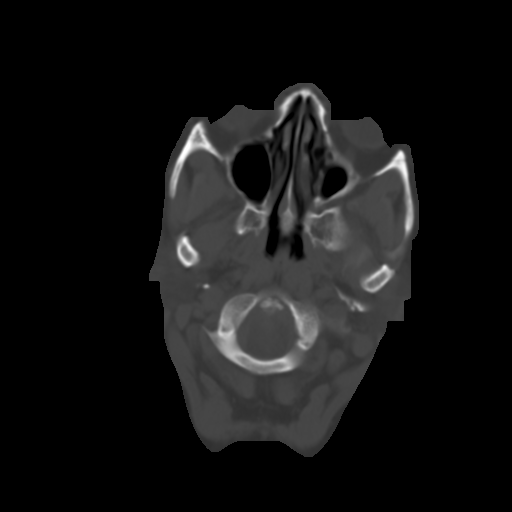
[im 7/32  brain]
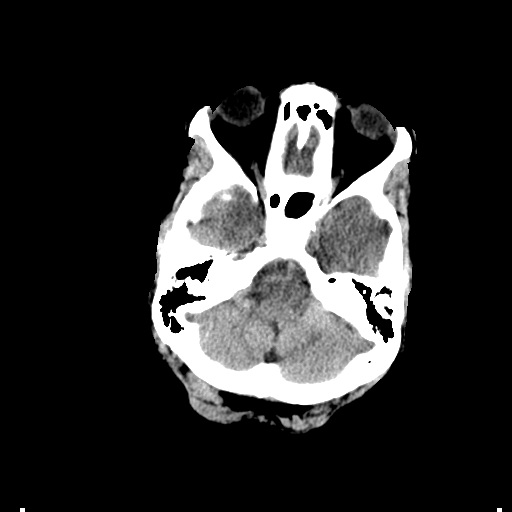
[im 10/32  brain]
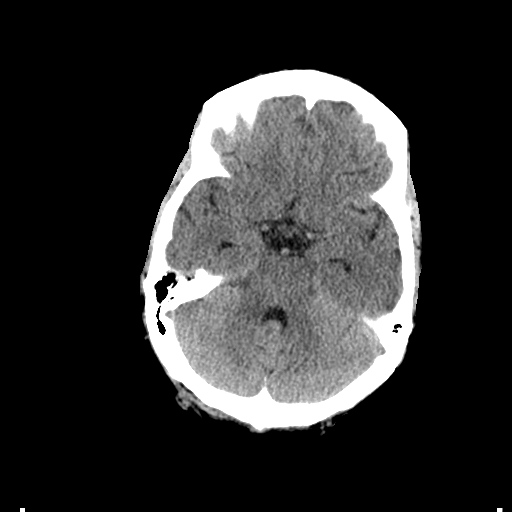
[im 14/32  brain]
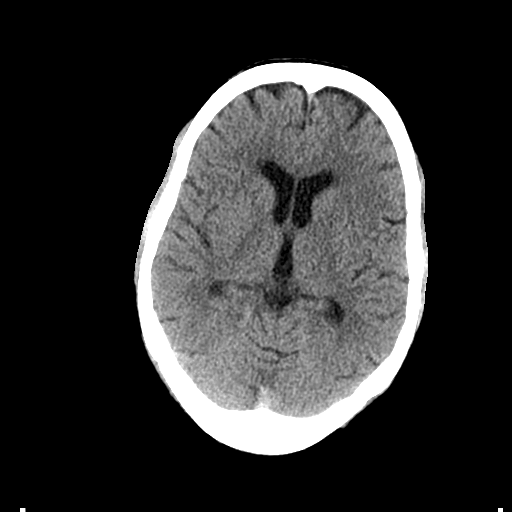
[im 18/32  brain]
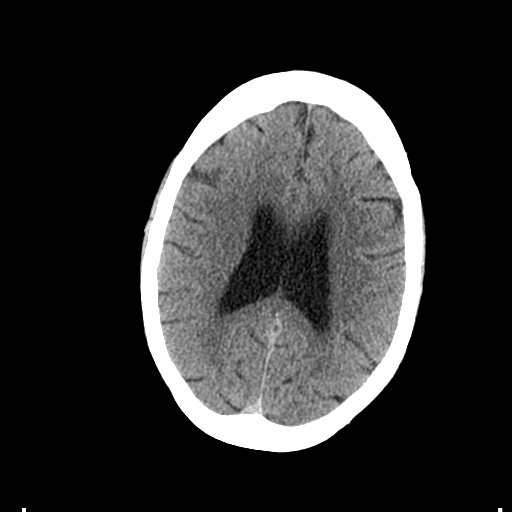
[im 18/32  bone]
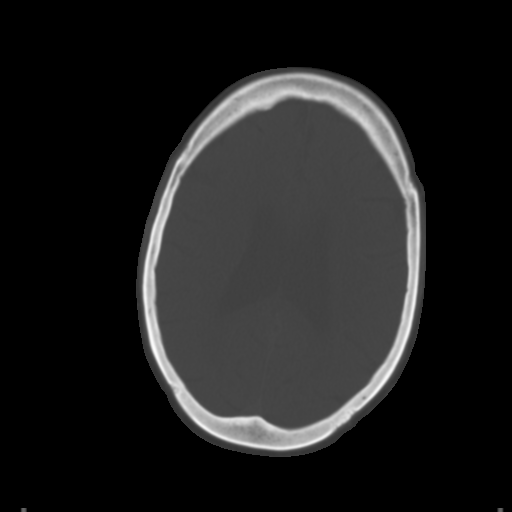
[im 22/32  brain]
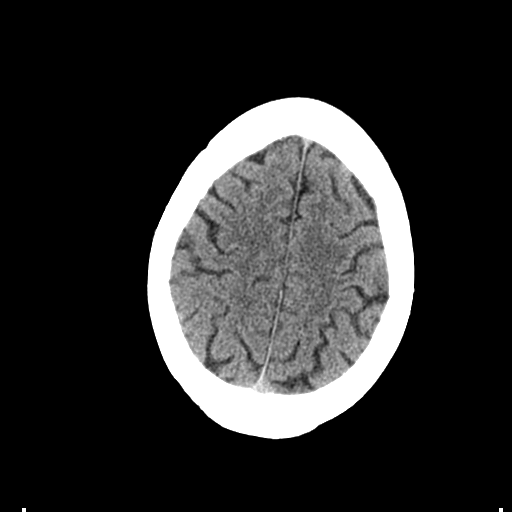
[im 25/32  brain]
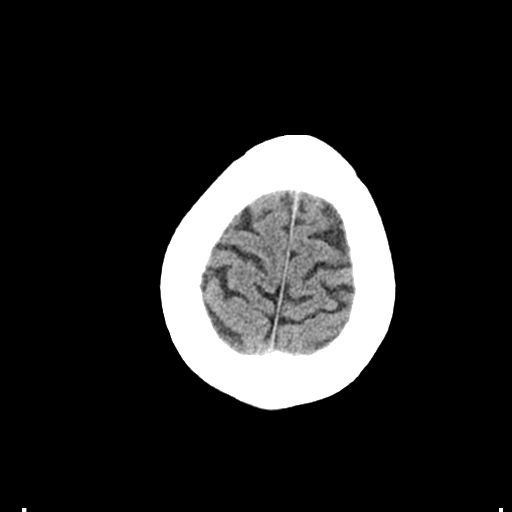
[im 29/32  brain]
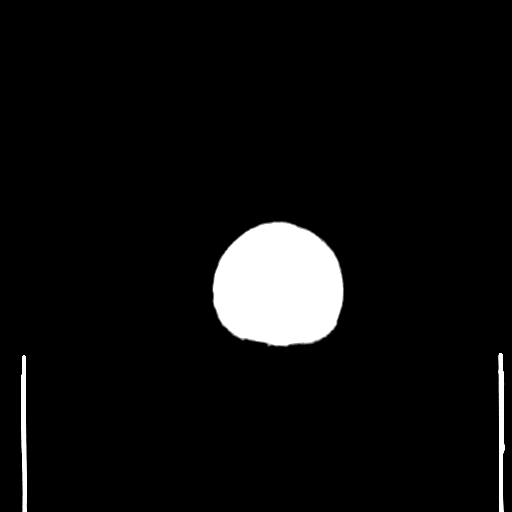

[Series 5: coronal soft tissue · coronal · 0.31mm/px · 3 of 71 slices shown]
[im 24/71  brain]
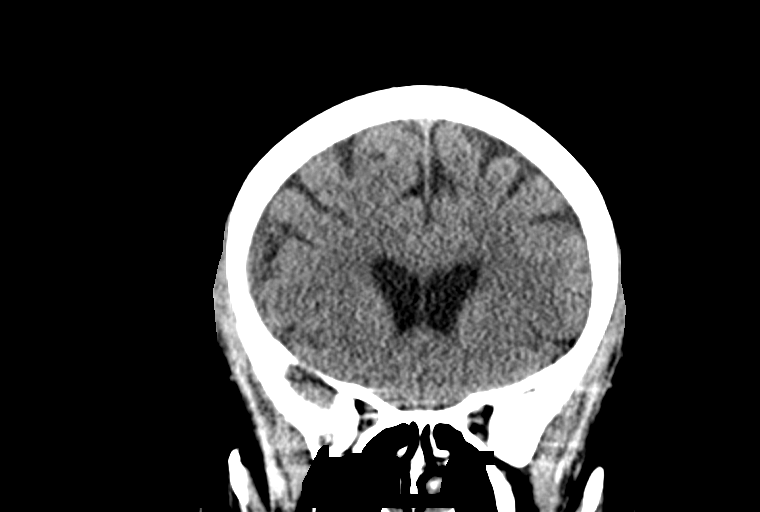
[im 32/71  brain]
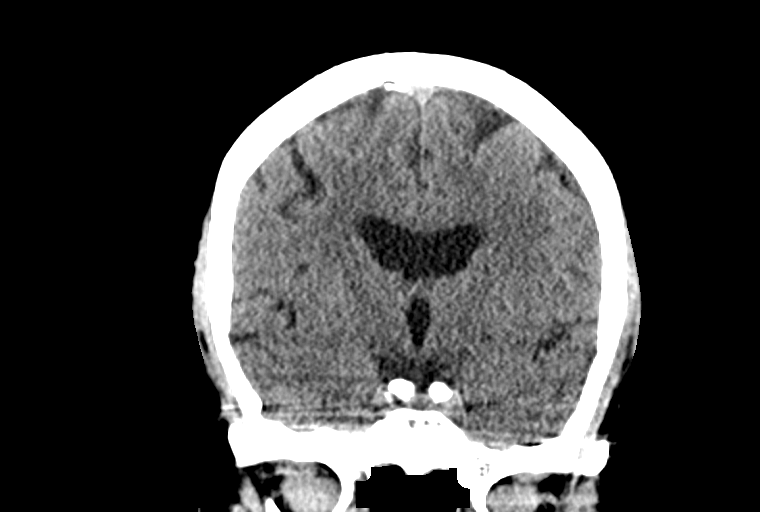
[im 39/71  brain]
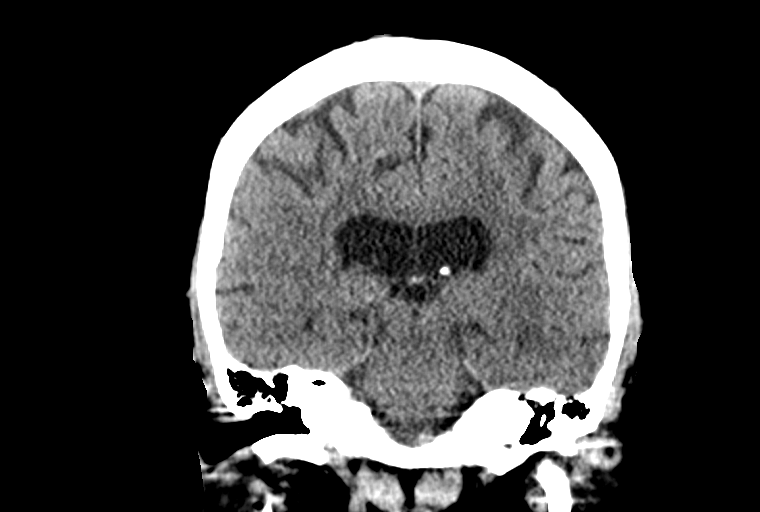

[Series 6: sagittal soft tissue · sagittal · 0.31mm/px · 3 of 55 slices shown]
[im 19/55  brain]
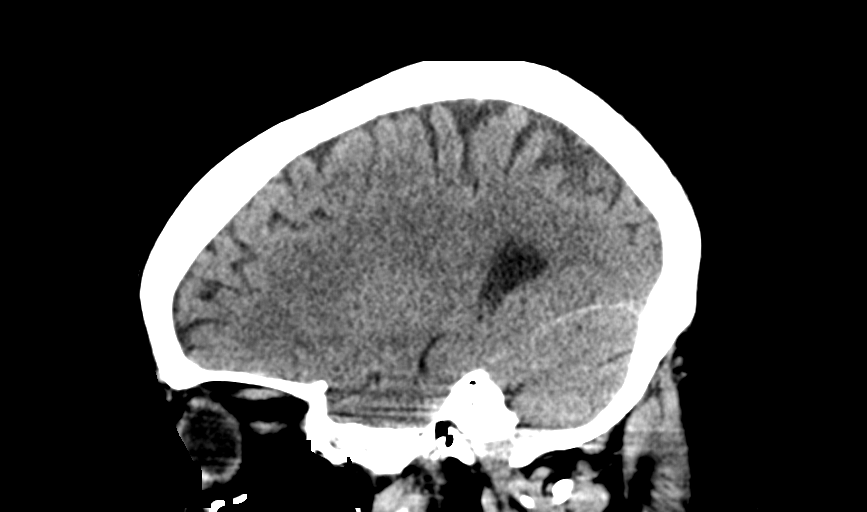
[im 28/55  brain]
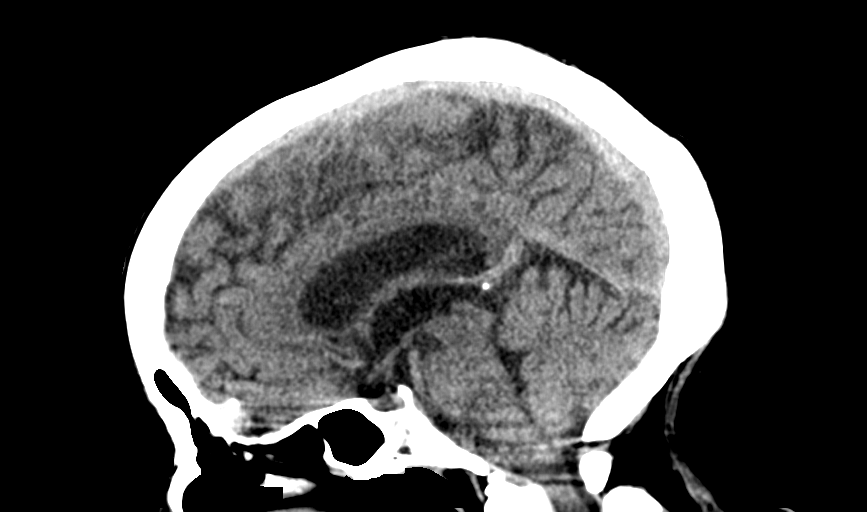
[im 37/55  brain]
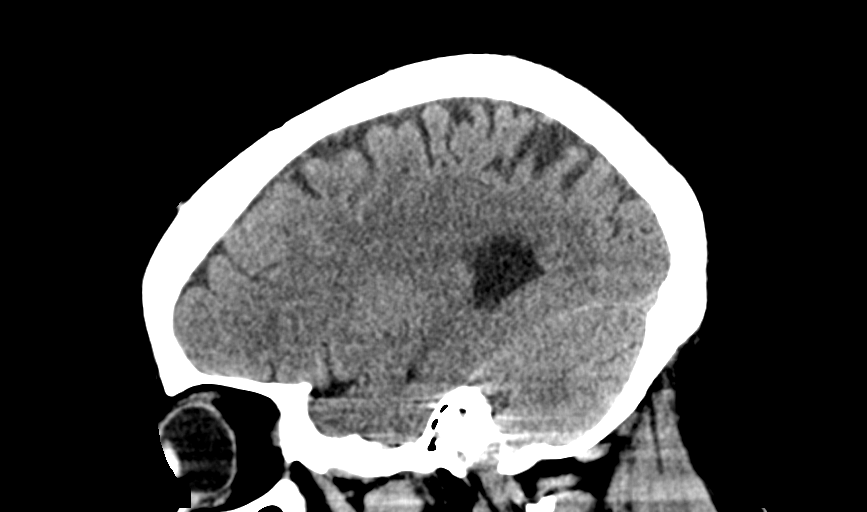

[14 of 47 positions shown; findings below may reference images not displayed]

FINDINGS: Brain: No acute infarct or hemorrhage. Lateral ventricles and
midline structures are unremarkable. No acute extra-axial fluid
collections. No mass effect.

Vascular: No hyperdense vessel or unexpected calcification.

Skull: Normal. Negative for fracture or focal lesion.

Sinuses/Orbits: No acute finding.

Other: None.
IMPRESSION: 1. Stable head CT, no acute intracranial process.

## 2023-06-04 NOTE — Telephone Encounter (Signed)
Next appt scheduled 11/19 with PCP. 

## 2023-06-04 NOTE — Progress Notes (Signed)
  Care Coordination   Note   06/04/2023 Name: MEHANA VOZZA MRN: [id number] DOB: 13-Apr-1966  Hannah Young is a 57 y.o. year old female who sees Morene Crocker, MD for primary care. I reached out to Hannah Young by phone today to offer care coordination services.  Ms. Boitnott was given information about Care Coordination services today including:   The Care Coordination services include support from the care team which includes your Nurse Coordinator, Clinical Social Worker, or Pharmacist.  The Care Coordination team is here to help remove barriers to the health concerns and goals most important to you. Care Coordination services are voluntary, and the patient may decline or stop services at any time by request to their care team member.   Care Coordination Consent Status: Patient agreed to services and verbal consent obtained.   Follow up plan:  Telephone appointment with care coordination team member scheduled for:  06/05/23  Encounter Outcome:  Patient Scheduled  [facility] Coordination Care Guide  Direct Dial: [phone]

## 2023-06-05 ENCOUNTER — Ambulatory Visit: Payer: Self-pay

## 2023-06-05 DIAGNOSIS — Z789 Other specified health status: Secondary | ICD-10-CM

## 2023-06-05 DIAGNOSIS — M150 Primary generalized (osteo)arthritis: Secondary | ICD-10-CM

## 2023-06-05 DIAGNOSIS — M1612 Unilateral primary osteoarthritis, left hip: Secondary | ICD-10-CM

## 2023-06-05 DIAGNOSIS — Z794 Long term (current) use of insulin: Secondary | ICD-10-CM

## 2023-06-05 NOTE — Patient Outreach (Signed)
Care Coordination   Follow Up Visit Note   06/05/2023 Name: Hannah Young MRN: [id number] DOB: [birth year]  Hannah Young is a 57 y.o. year old female who sees Morene Crocker, MD for primary care. I spoke with  Hannah Young by phone today.  What matters to the patients health and wellness today?  The patient presents with an A1C level of 8.3 and is currently receiving her medications in pill packs. She administers Lantus insulin at a dosage of 15 units and Humalog insulin at 5 units with each meal. At this time, she does not possess a glucometer; however, a prescription for a Dexcom has been issued. The delivery of the Dexcom sensors is scheduled for today.  The patient has communicated that her physician informed her that her phone can serve as a reading device. Her grandson can assist her in configuring the application on her phone. Should she experience difficulties in establishing a connection, she has been advised to contact the office, at which point a prescription for a dedicated reader will be provided.  Furthermore, the patient has requested a referral to the care guide for assistance with transportation and housing. I will also forward educational materials regarding diabetes through MyChart, as she is anticipating this information.      Goals Addressed               This Visit's Progress     Diabetes Patient stated goal- to lower a1c level and manage falls (pt-stated)        Patient Goals/Self Care Activities: -Patient/Caregiver will take medications as prescribed   -Patient/Caregiver will attend all scheduled provider appointments -Patient/Caregiver will call pharmacy for medication refills 3-7 days in advance of running out of medications -Patient/Caregiver will call provider office for new concerns or questions  -Patient/Caregiver will focus on medication adherence by taking medications as prescribed    Provided education to patient about basic DM  disease process Reviewed medications with patient and discussed importance of medication adherence Counseled on importance of regular laboratory monitoring as prescribed Discussed plans with patient for ongoing care management follow up and provided patient with direct contact information for care management team Provided patient with written educational materials related to hypo and hyperglycemia and importance of correct treatment Reviewed scheduled/upcoming provider appointments including Review of patient status, including review of consultants reports, relevant laboratory and other test results, and medications completed Lab Results  Component Value Date   HGBA1C 8.3 (A) 06/01/2023    Reviewed medications and discussed potential side effects of medications such as dizziness and frequent urination Advised patient of importance of notifying provider of falls Assessed for falls since last encounter -Utilize Rollator or wheelchair (assistive device) appropriately with all ambulation -De-clutter walkways -Change positions slowly -Utilize home lighting for dim lit areas            SDOH assessments and interventions completed:  Yes  SDOH Interventions Today    Flowsheet Row Most Recent Value  SDOH Interventions   Housing Interventions --  [will send a referral to the care guides]  Transportation Interventions Taxi Voucher Given  [Sending a referral to care guides]        Care Coordination Interventions:  Yes, provided   Interventions Today    Flowsheet Row Most Recent Value  Chronic Disease   Chronic disease during today's visit Diabetes, Other  [Falls]  General Interventions   General Interventions Discussed/Reviewed General Interventions Discussed, General Interventions Reviewed  Nutrition Interventions   Nutrition Discussed/Reviewed  Nutrition Discussed  Pharmacy Interventions   Pharmacy Dicussed/Reviewed Pharmacy Topics Discussed  Safety Interventions   Safety  Discussed/Reviewed Fall Risk, Safety Discussed        Follow up plan: Follow up call scheduled for 06/20/23  11 am    Encounter Outcome:  Patient Visit Completed   Juanell Fairly RN, BSN, [facility]   Care Coordinator Phone: [phone]

## 2023-06-05 NOTE — Patient Instructions (Signed)
Visit Information  Thank you for taking time to visit with me today. Please don't hesitate to contact me if I can be of assistance to you.   Following are the goals we discussed today:   Goals Addressed               This Visit's Progress     Diabetes Patient stated goal- to lower a1c level and manage falls (pt-stated)        Patient Goals/Self Care Activities: -Patient/Caregiver will take medications as prescribed   -Patient/Caregiver will attend all scheduled provider appointments -Patient/Caregiver will call pharmacy for medication refills 3-7 days in advance of running out of medications -Patient/Caregiver will call provider office for new concerns or questions  -Patient/Caregiver will focus on medication adherence by taking medications as prescribed    Provided education to patient about basic DM disease process Reviewed medications with patient and discussed importance of medication adherence Counseled on importance of regular laboratory monitoring as prescribed Discussed plans with patient for ongoing care management follow up and provided patient with direct contact information for care management team Provided patient with written educational materials related to hypo and hyperglycemia and importance of correct treatment Reviewed scheduled/upcoming provider appointments including Review of patient status, including review of consultants reports, relevant laboratory and other test results, and medications completed Lab Results  Component Value Date   HGBA1C 8.3 (A) 06/01/2023    Reviewed medications and discussed potential side effects of medications such as dizziness and frequent urination Advised patient of importance of notifying provider of falls Assessed for falls since last encounter -Utilize Rollator or wheelchair (assistive device) appropriately with all ambulation -De-clutter walkways -Change positions slowly -Utilize home lighting for dim lit areas             Our next appointment is by telephone on 06/20/23 at 11 am  Please call the care guide team at [phone] if you need to cancel or reschedule your appointment.   If you are experiencing a Mental Health or Behavioral Health Crisis or need someone to talk to, please call 1-800-273-TALK (toll free, 24 hour hotline)  Patient verbalizes understanding of instructions and care plan provided today and agrees to view in MyChart. Active MyChart status and patient understanding of how to access instructions and care plan via MyChart confirmed with patient.     Juanell Fairly RN, BSN, [facility] [employer]er Phone: [phone]

## 2023-06-06 ENCOUNTER — Telehealth: Payer: Self-pay | Admitting: Dietician

## 2023-06-06 DIAGNOSIS — Z77120 Contact with and (suspected) exposure to mold (toxic): Secondary | ICD-10-CM

## 2023-06-06 DIAGNOSIS — Z794 Long term (current) use of insulin: Secondary | ICD-10-CM

## 2023-06-06 DIAGNOSIS — E1169 Type 2 diabetes mellitus with other specified complication: Secondary | ICD-10-CM

## 2023-06-06 MED ORDER — FLUTICASONE PROPIONATE 50 MCG/ACT NA SUSP: 2.0000 | Freq: Every day | NASAL | 2 refills | Status: DC

## 2023-06-06 MED ORDER — DEXCOM G7 RECEIVER DEVI
1 refills | Status: DC
Start: 2023-06-06 — End: 2023-07-11

## 2023-06-06 NOTE — Telephone Encounter (Signed)
R phone doe snot work with the Dexcom G7 so she is requesting a receiver prescription be sent to her pharmacy.   She also asked for refills on her inhaler and Flonase. I suggested she call her pharmacy as it appears she may have refills.

## 2023-06-07 ENCOUNTER — Ambulatory Visit: Payer: Self-pay

## 2023-06-07 NOTE — Patient Instructions (Signed)
Visit Information  Thank you for taking time to visit with me today. Please don't hesitate to contact me if I can be of assistance to you.   Following are the goals we discussed today:  - Remain engaged with [employer] authority - Work with SW team to determine SCAT eligibility  Your next appointment is by telephone on 06/18/23 at 1:00 pm with Remigio Eisenmenger, BSW  Please call the care guide team at [phone] if you need to cancel or reschedule your appointment.   If you are experiencing a Mental Health or Behavioral Health Crisis or need someone to talk to, please call 1-800-273-TALK (toll free, 24 hour hotline) go to [facility] [address], [place] [phone]) call 911  Patient verbalizes understanding of instructions and care plan provided today and agrees to view in MyChart. Active MyChart status and patient understanding of how to access instructions and care plan via MyChart confirmed with patient.     Bevelyn Ngo, BSW, CDP [facility]  [facility], [facility] Social Worker Direct Dial: [phone]  Fax: [phone]

## 2023-06-07 NOTE — Patient Outreach (Signed)
Care Coordination   Follow Up Visit Note   06/07/2023 Name: Hannah Young MRN: [id number] DOB: 1966-05-24  Hannah Young is a 57 y.o. year old female who sees Morene Crocker, MD for primary care. I spoke with  Hannah Young by phone today.  What matters to the patients health and wellness today?  The patient is interested in applying for SCAT transportation services.    Goals Addressed             This Visit's Progress    Care Coordination Activities       Care Coordination Interventions: Determined the patient is currently utilizing [employer], [place] transportation, and the fixed bus route for transportation Discussed the patient can no longer used the fixed route due to her being a high fall risk. The patient is interested in resources to transport her to provider appointments as well as the grocery store Education provided on [employer] "SCAT" transportation advising it will only operate within the [place] city limits. Patient is interested in applying for this resource Assisted the patient with completion of Part A and submitted via online portal; faxed Part B to the providers office for completion Discussed the patient is interested in housing resources because her current home is in foreclosure. The patient reports the landlord of the home stopped paying the mortgage and has stopped answering the patients calls Determined the patient has spoken with the city and was advised it could take up to 1 year for the foreclosure to be finalized Reviewed the patient is already on the wait list with [employer], she is too young to apply for Section 8 through the [employer] due to age requirements Discussed the patient has her 25 and 11 year old grandchildren living with her therefore she needs a home large enough for three people Advised the patient housing resources are limited due to the high number of people searching for housing.  The patient has already attempted to contact DSS for assistance but was told this is not a resource DSS assists with. The patient has also tried to get assistance from the public school system Discussed the patient will remain in her current home and hope to be given a housing voucher in [facility]  Scheduled follow up call with Remigio Eisenmenger, BSW on 11/4 who will follow up with the patient to assist with ongoing resource needs related to transportation and housing         SDOH assessments and interventions completed:  Yes  SDOH Interventions Today    Flowsheet Row Most Recent Value  SDOH Interventions   Food Insecurity Interventions Intervention Not Indicated  Housing Interventions Other (Comment)  [Patient reports she is already on the list for [employer] housing authority. She has worked with the housing coalition as well. Her home is under foreclosure as the landlord has not been paying the mortgage. No eviction date at this time.]  Transportation Interventions Other (Comment)  [Patient currently utilizes [place], Fixed bus route, and Medicaid transportation. She would like SCAT. Application initiated 10.24.24]        Care Coordination Interventions:  Yes, provided   Interventions Today    Flowsheet Row Most Recent Value  Chronic Disease   Chronic disease during today's visit Other  [Transportation, Housing]  General Interventions   General Interventions Discussed/Reviewed General Interventions Discussed, [employer]  [Initiated a SCAT application to assist with travel to places that are not related to medical needs. The patient currently  has Medicaid transportation to assist with medical appointments]  Education Interventions   Education Provided Provided Education  Provided Verbal Education On [employer]  [Educated the patient on SCAT as well as housing resource options. The patient is on the wait list for [employer]. She is not eligible to apply  for [employer] at this time due to age requirements]        Follow up plan: Follow up call scheduled for 06/18/23 with Remigio Eisenmenger, BSW.    Encounter Outcome:  Patient Visit Completed   Bevelyn Ngo, BSW, CDP [facility]  [facility], [facility] Social Worker Direct Dial: [phone]  Fax: [phone]

## 2023-06-14 NOTE — Progress Notes (Signed)
Internal Medicine Clinic Attending  I was physically present during the key portions of the resident provided service and participated in the medical decision making of patient's management care. I reviewed pertinent patient test results.  The assessment, diagnosis, and plan were formulated together and I agree with the documentation in the resident's note.  Williams, Julie Anne, MD  

## 2023-06-18 ENCOUNTER — Ambulatory Visit: Payer: 59 | Admitting: Podiatry

## 2023-06-18 ENCOUNTER — Encounter: Payer: Self-pay | Admitting: Dietician

## 2023-06-18 ENCOUNTER — Ambulatory Visit: Payer: Self-pay | Admitting: Licensed Clinical Social Worker

## 2023-06-18 NOTE — Patient Outreach (Signed)
  Care Coordination   06/18/2023 Name: Hannah Young MRN: [id number] DOB: 13-Apr-1966   Care Coordination Outreach Attempts:  An unsuccessful telephone outreach was attempted for a scheduled appointment today.  Follow Up Plan:  Additional outreach attempts will be made to offer the patient care coordination information and services.   Encounter Outcome:  No Answer   Care Coordination Interventions:  No, not indicated    Jeanie Cooks, PhD [facility], [facility] Social Worker Direct Dial: [phone]  Fax: [phone]

## 2023-06-19 ENCOUNTER — Encounter: Payer: Self-pay | Admitting: Licensed Clinical Social Worker

## 2023-06-19 ENCOUNTER — Ambulatory Visit: Payer: Self-pay | Admitting: Licensed Clinical Social Worker

## 2023-06-19 ENCOUNTER — Telehealth: Payer: Self-pay

## 2023-06-19 ENCOUNTER — Telehealth: Payer: Self-pay | Admitting: Licensed Clinical Social Worker

## 2023-06-19 NOTE — Telephone Encounter (Signed)
Transition Care Management Unsuccessful Follow-up Telephone Call  Date of discharge and from where:  Hannah Young 9/30  Attempts:  1st Attempt  Reason for unsuccessful TCM follow-up call:  No answer/busy   Hannah Young  Hannah Young, [facility] Guide, Phone: [phone] Website: [email or web address]

## 2023-06-19 NOTE — Patient Outreach (Signed)
  Care Coordination   06/19/2023 Name: Hannah Young MRN: [id number] DOB: 12/12/1965   Care Coordination Outreach Attempts:  An unsuccessful telephone outreach was attempted for a scheduled appointment today.  Follow Up Plan:  Additional outreach attempts will be made to offer the patient care coordination information and services.   Encounter Outcome:  No Answer   Care Coordination Interventions:  No, not indicated    Jeanie Cooks, PhD [facility], [facility] Social Worker Direct Dial: [phone]  Fax: [phone]

## 2023-06-19 NOTE — Patient Outreach (Signed)
  Care Coordination   Follow Up Visit Note   06/19/2023 Name: Hannah Young MRN: [id number] DOB: 08-12-1966  Hannah Young is a 57 y.o. year old female who sees Morene Crocker, MD for primary care. I spoke with  Hannah Young by phone today.  What matters to the patients health and wellness today?  SCAT Application    Goals Addressed             This Visit's Progress    Care Coordination Activities       Care Coordination Interventions: Patient is still waiting on the SCAT application approval, SW will follow up with the PCP on their part section B, patient did not have any other concerns at this time. Patient has Medicaid and this provides her transportation to her medical appointment. The patient needs transportation to the grocery store and to pay bills. SW did encourage the patient to utilize other resources from family and friends, but but the patient state that she does not have any family here to assist and that she uses [place] or [address].  SW will reach out  to the PCP to check the status of the Part B form .          SDOH assessments and interventions completed:  Yes  SDOH Interventions Today    Flowsheet Row Most Recent Value  SDOH Interventions   Food Insecurity Interventions Intervention Not Indicated  Utilities Interventions Intervention Not Indicated        Care Coordination Interventions:  Yes, provided  Interventions Today    Flowsheet Row Most Recent Value  General Interventions   General Interventions Discussed/Reviewed General Interventions Discussed, General Interventions Reviewed, [employer] is still waiting on the SCAT application approval, SW will follow up with the PCP on their part section B]        Follow up plan: Follow up call scheduled for 06/25/2023 at 9:30 am    Encounter Outcome:  Patient Visit Completed   Jeanie Cooks, PhD [facility], [facility] Social  Worker Direct Dial: [phone]  Fax: [phone]

## 2023-06-19 NOTE — Patient Instructions (Signed)
Visit Information  Thank you for taking time to visit with me today. Please don't hesitate to contact me if I can be of assistance to you.   Following are the goals we discussed today:   Goals Addressed             This Visit's Progress    Care Coordination Activities       Care Coordination Interventions: Patient is still waiting on the SCAT application approval, SW will follow up with the PCP on their part section B, patient did not have any other concerns at this time. Patient has Medicaid and this provides her transportation to her medical appointment. The patient needs transportation to the grocery store and to pay bills. SW did encourage the patient to utilize other resources from family and friends, but but the patient state that she does not have any family here to assist and that she uses [place] or [address].  SW will reach out  to the PCP to check the status of the Part B form .          Our next appointment is by telephone on 06/25/2023 at 9:30 am  Please call the care guide team at [phone] if you need to cancel or reschedule your appointment.   If you are experiencing a Mental Health or Behavioral Health Crisis or need someone to talk to, please call the Suicide and Crisis Lifeline: 988 go to [facility] [address] [place] [phone]) call 911  Patient verbalizes understanding of instructions and care plan provided today and agrees to view in MyChart. Active MyChart status and patient understanding of how to access instructions and care plan via MyChart confirmed with patient.     Jeanie Cooks, PhD [facility], [facility] Social Worker Direct Dial: [phone]  Fax: [phone]

## 2023-06-20 ENCOUNTER — Telehealth: Payer: Self-pay

## 2023-06-20 ENCOUNTER — Ambulatory Visit: Payer: Self-pay

## 2023-06-20 NOTE — Patient Instructions (Signed)
Visit Information  Thank you for taking time to visit with me today. Please don't hesitate to contact me if I can be of assistance to you.   Following are the goals we discussed today:   Goals Addressed               This Visit's Progress     Diabetes Patient stated goal- to lower a1c level and manage falls (pt-stated)        Patient Goals/Self Care Activities: -Patient/Caregiver will take medications as prescribed   -Patient/Caregiver will attend all scheduled provider appointments -Patient/Caregiver will call pharmacy for medication refills 3-7 days in advance of running out of medications -Patient/Caregiver will call provider office for new concerns or questions  -Patient/Caregiver will focus on medication adherence by taking medications as prescribed    Reviewed medications with patient and discussed importance of medication adherence Counseled on importance of regular laboratory monitoring as prescribed Discussed plans with patient for ongoing care management follow up and provided patient with direct contact information for care management team Provided patient with written educational materials related to hypo and hyperglycemia and importance of correct treatment Reviewed scheduled/upcoming provider appointments including Review of patient status, including review of consultants reports, relevant laboratory and other test results, and medications completed The patient needs to start checking her blood sugars on a daily basis Lab Results  Component Value Date   HGBA1C 8.3 (A) 06/01/2023    Reviewed medications and discussed potential side effects of medications such as dizziness and frequent urination Advised patient of importance of notifying provider of falls Assessed for falls since last encounter -Utilize Rollator or wheelchair (assistive device) appropriately with all ambulation -De-clutter walkways -Change positions slowly -Utilize home lighting for dim lit areas              Please call the care guide team at [phone] if you need to cancel or reschedule your appointment.   If you are experiencing a Mental Health or Behavioral Health Crisis or need someone to talk to, please call 1-800-273-TALK (toll free, 24 hour hotline)  Patient verbalizes understanding of instructions and care plan provided today and agrees to view in MyChart. Active MyChart status and patient understanding of how to access instructions and care plan via MyChart confirmed with patient.     Juanell Fairly RN, BSN, [facility]  [facility], [facility]  Care Coordinator Phone: [phone]

## 2023-06-20 NOTE — Telephone Encounter (Signed)
Transition Care Management Follow-up Telephone Call Date of discharge and from where: [facility] How have you been since you were released from the hospital? Pt is still having difficulties and she is following up with providers Any questions or concerns? No  Items Reviewed: Did the pt receive and understand the discharge instructions provided? Yes  Medications obtained and verified? Yes  Other? No  Any new allergies since your discharge? No  Dietary orders reviewed? No Do you have support at home? No     Follow up appointments reviewed:  PCP Hospital f/u appt confirmed? Yes  Scheduled to see PCP on 11/6 @ . Specialist Hospital f/u appt confirmed? Yes  Scheduled to see  on  @ . Are transportation arrangements needed? No  If their condition worsens, is the pt aware to call PCP or go to the Emergency Dept.? Yes Was the patient provided with contact information for the PCP's office or ED? Yes Was to pt encouraged to call back with questions or concerns? Yes

## 2023-06-20 NOTE — Patient Outreach (Signed)
  Care Coordination   Follow Up Visit Note   06/20/2023 Name: ETHELLE OLA MRN: [id number] DOB: 10/03/1965  [name] is a 57 y.o. year old female who sees Morene Crocker, MD for primary care. I spoke with  [name] by phone today.  What matters to the patients health and wellness today?  I spoke with Mrs. Bonebrake on the phone, but she mentioned that she had another call coming in and would give me a callback later. Mrs. Mccole called me back to inform me that her blood sugar was 120. I advised her to check her blood sugars daily. She explained that she has a lot going on, making it challenging to check regularly. Mrs. Doughman has several appointments scheduled and expressed her desire not to reschedule any appointments with me now, as she wants to attend all her current appointments and get them done. She promised to call me back to reschedule her next follow-up appointment.    Goals Addressed               This Visit's Progress     Diabetes Patient stated goal- to lower a1c level and manage falls (pt-stated)        Patient Goals/Self Care Activities: -Patient/Caregiver will take medications as prescribed   -Patient/Caregiver will attend all scheduled provider appointments -Patient/Caregiver will call pharmacy for medication refills 3-7 days in advance of running out of medications -Patient/Caregiver will call provider office for new concerns or questions  -Patient/Caregiver will focus on medication adherence by taking medications as prescribed    Reviewed medications with patient and discussed importance of medication adherence Counseled on importance of regular laboratory monitoring as prescribed Discussed plans with patient for ongoing care management follow up and provided patient with direct contact information for care management team Provided patient with written educational materials related to hypo and hyperglycemia and importance of correct  treatment Reviewed scheduled/upcoming provider appointments including Review of patient status, including review of consultants reports, relevant laboratory and other test results, and medications completed The patient needs to start checking her blood sugars on a daily basis Lab Results  Component Value Date   HGBA1C 8.3 (A) 06/01/2023    Reviewed medications and discussed potential side effects of medications such as dizziness and frequent urination Advised patient of importance of notifying provider of falls Assessed for falls since last encounter -Utilize Rollator or wheelchair (assistive device) appropriately with all ambulation -De-clutter walkways -Change positions slowly -Utilize home lighting for dim lit areas            SDOH assessments and interventions completed:  No     Care Coordination Interventions:  Yes, provided   Interventions Today    Flowsheet Row Most Recent Value  Chronic Disease   Chronic disease during today's visit Diabetes  General Interventions   General Interventions Discussed/Reviewed General Interventions Discussed, Durable Medical Equipment (DME)  Durable Medical Equipment (DME) Glucomoter  Education Interventions   Provided Verbal Education On Blood Sugar Monitoring  Pharmacy Interventions   Pharmacy Dicussed/Reviewed Pharmacy Topics Discussed  Safety Interventions   Safety Discussed/Reviewed Safety Discussed, Fall Risk        Follow up plan:  The patient said that she will call back to schedule an appointment.    Encounter Outcome:  Patient Visit Completed   Juanell Fairly RN, BSN, [facility] [place]  [facility], [facility]  Care Coordinator Phone: [phone]

## 2023-06-21 ENCOUNTER — Ambulatory Visit: Payer: 59 | Admitting: Orthopaedic Surgery

## 2023-06-21 ENCOUNTER — Encounter: Payer: Self-pay | Admitting: Orthopaedic Surgery

## 2023-06-21 ENCOUNTER — Other Ambulatory Visit: Payer: Self-pay | Admitting: Student

## 2023-06-21 VITALS — Ht 61.0 in | Wt 182.0 lb

## 2023-06-21 DIAGNOSIS — E119 Type 2 diabetes mellitus without complications: Secondary | ICD-10-CM

## 2023-06-21 DIAGNOSIS — M1612 Unilateral primary osteoarthritis, left hip: Secondary | ICD-10-CM | POA: Diagnosis not present

## 2023-06-21 NOTE — Progress Notes (Signed)
Office Visit Note   Patient: Hannah Young           Date of Birth: April 11, 1966           MRN: [id number] Visit Date: 06/21/2023              Requested by: Morene Crocker, MD [address] [place],  [place] [address] PCP: Morene Crocker, MD   Assessment & Plan: Visit Diagnoses:  1. Primary osteoarthritis of left hip     Plan: [name] has advanced left hip DJD which is bone-on-bone.  Unfortunately her A1c is 8.2.  I explained that this needs to be 7.7 or less in order to proceed with surgery.  She will continue with all efforts to lower the A1c.  Her PCP would like her to continue to lose weight.  She will follow-up with [place] once she has achieved a proper A1c.  Follow-Up Instructions: No follow-ups on file.   Orders:  No orders of the defined types were placed in this encounter.  No orders of the defined types were placed in this encounter.     Procedures: No procedures performed   Clinical Data: No additional findings.   Subjective: Chief Complaint  Patient presents with   Left Hip - Pain    HPI Hannah Young returns today for follow-up for left hip DJD.  Review of Systems  Constitutional: Negative.   HENT: Negative.    Eyes: Negative.   Respiratory: Negative.    Cardiovascular: Negative.   Endocrine: Negative.   Musculoskeletal: Negative.   Neurological: Negative.   Hematological: Negative.   Psychiatric/Behavioral: Negative.    All other systems reviewed and are negative.    Objective: Vital Signs: Ht 5\' 1"  (1.549 m)   Wt 182 lb (82.6 kg)   BMI 34.39 kg/m   Physical Exam Vitals and nursing note reviewed.  Constitutional:      Appearance: She is well-developed.  HENT:     Head: Atraumatic.     Nose: Nose normal.  Eyes:     Extraocular Movements: Extraocular movements intact.  Cardiovascular:     Pulses: Normal pulses.  Pulmonary:     Effort: Pulmonary effort is normal.  Abdominal:     Palpations: Abdomen is soft.   Musculoskeletal:     Cervical back: Neck supple.  Skin:    General: Skin is warm.     Capillary Refill: Capillary refill takes less than 2 seconds.  Neurological:     Mental Status: She is alert. Mental status is at baseline.  Psychiatric:        Behavior: Behavior normal.        Thought Content: Thought content normal.        Judgment: Judgment normal.     Ortho Exam Exam of the left hip is unchanged from prior visit. Specialty Comments:  No specialty comments available.  Imaging: No results found.   PMFS History: Patient Active Problem List   Diagnosis Date Noted   On prednisone therapy 06/01/2023   Type 2 diabetes mellitus without complication, with long-term current use of insulin (HCC) 06/01/2023   Epigastric pain 06/01/2023   OSA (obstructive sleep apnea) 06/01/2023   Insomnia 01/03/2023   Morbid obesity (HCC) 01/02/2023   PMB (postmenopausal bleeding) 11/09/2022   Endometrial thickening on ultrasound 11/09/2022   HLD (hyperlipidemia) 08/26/2022   Primary osteoarthritis of left hip 02/02/2022   Primary osteoarthritis of left knee 02/02/2022   Mold exposure 09/06/2021   Alopecia 10/21/2020   Chronic  right shoulder pain 06/02/2020   Bilateral occipital neuralgia 05/01/2020   Chronic neck pain 05/01/2020   Arthralgia 03/29/2020   Osteoarthritis 02/12/2020   Healthcare maintenance 04/29/2018   Multiple lung nodules on CT 01/25/2017   Moderate recurrent major depression (HCC) 01/25/2017   Autoimmune hepatitis (HCC) 01/30/2012   Type 2 diabetes mellitus with other specified complication (HCC) 01/30/2012   Asthma 01/30/2012   Hypertension 01/30/2012   Past Medical History:  Diagnosis Date   Anxiety    Arthritis    Asthma    Autoimmune hepatitis (HCC)    Bipolar 1 disorder (HCC)    Breast lump    Depression    Diabetes mellitus    FH: chemotherapy    Gout    Hair loss 10/21/2020   Headache 02/01/2021   High cholesterol    Housing situation unstable  03/22/2021   Hypertension    Liver cirrhosis (HCC)    Lung nodules    Neuropathy    Panic disorder    STD exposure 02/23/2021   Vaginal discharge 01/25/2017   Vertigo     Family History  Problem Relation Age of Onset   Liver disease Mother    Kidney failure Father    Heart disease Sister    Liver disease Daughter    Anxiety disorder Daughter    Heart disease Son    Colon cancer Neg Hx    Stomach cancer Neg Hx    Migraines Neg Hx    Headache Neg Hx     Past Surgical History:  Procedure Laterality Date   Biopsy of liver     CESAREAN SECTION     LAPAROSCOPY     TUBAL LIGATION     Social History   Occupational History   Not on file  Tobacco Use   Smoking status: Former    Current packs/day: 0.00    Types: Cigarettes    Quit date: 08/22/2020    Years since quitting: 2.8   Smokeless tobacco: Never   Tobacco comments:    2-3 cigs/day  Vaping Use   Vaping status: Never Used  Substance and Sexual Activity   Alcohol use: Not Currently    Alcohol/week: 3.0 standard drinks of alcohol    Types: 3 Cans of beer per week   Drug use: No   Sexual activity: Not Currently    Birth control/protection: Surgical    Comment: States she is not currently active

## 2023-06-25 ENCOUNTER — Encounter: Payer: 59 | Admitting: Licensed Clinical Social Worker

## 2023-06-25 ENCOUNTER — Ambulatory Visit: Payer: Self-pay | Admitting: Licensed Clinical Social Worker

## 2023-06-25 NOTE — Patient Outreach (Signed)
  Care Coordination   Follow Up Visit Note   06/25/2023 Name: Hannah Young MRN: [id number] DOB: 05/23/1966  Hannah Young is a 57 y.o. year old female who sees Morene Crocker, MD for primary care. I spoke with  Hannah Young by phone today.  What matters to the patients health and wellness today?  Follow Up on SCAT application Patient stated that she was notified by [employer]AT that they received the Application and everything is good to go and that it may take up to 30 for complete everything     Goals Addressed             This Visit's Progress    COMPLETED: Care Coordination Activities       Care Coordination Interventions: Patient is still waiting on the SCAT application approval, SW will follow up with the PCP on their part section B, patient did not have any other concerns at this time. Patient has Medicaid and this provides her transportation to her medical appointment. The patient needs transportation to the grocery store and to pay bills. SW did encourage the patient to utilize other resources from family and friends, but but the patient state that she does not have any family here to assist and that she uses [place] or [address].  SW will reach out  to the PCP to check the status of the Part B form .          SDOH assessments and interventions completed:  Yes  SDOH Interventions Today    Flowsheet Row Most Recent Value  SDOH Interventions   Housing Interventions Intervention Not Indicated  Transportation Interventions Intervention Not Indicated        Care Coordination Interventions:  Yes, provided  Interventions Today    Flowsheet Row Most Recent Value  General Interventions   General Interventions Discussed/Reviewed General Interventions Discussed, [employer] stated that she was notified by [employer] that they received the Application and everything is good to go and that it may take up to 30 for complete everything]        Follow up plan:  No further intervention required.   Encounter Outcome:  Patient Visit Completed   Jeanie Cooks, PhD [facility], [facility] Social Worker Direct Dial: [phone]  Fax: [phone]

## 2023-06-25 NOTE — Patient Outreach (Signed)
  Care Coordination   06/25/2023 Name: Hannah Young MRN: [id number] DOB: 12/08/1965   Care Coordination Outreach Attempts:  An unsuccessful telephone outreach was attempted for a scheduled appointment today.  Follow Up Plan:  Additional outreach attempts will be made to offer the patient care coordination information and services.   Encounter Outcome:  No Answer   Care Coordination Interventions:  No, not indicated    Jeanie Cooks, PhD [facility], [facility] Social Worker Direct Dial: [phone]  Fax: [phone]

## 2023-06-25 NOTE — Patient Instructions (Signed)
Visit Information  Thank you for taking time to visit with me today. Please don't hesitate to contact me if I can be of assistance to you.   Following are the goals we discussed today:   Goals Addressed             This Visit's Progress    COMPLETED: Care Coordination Activities       Care Coordination Interventions: Patient is still waiting on the SCAT application approval, SW will follow up with the PCP on their part section B, patient did not have any other concerns at this time. Patient has Medicaid and this provides her transportation to her medical appointment. The patient needs transportation to the grocery store and to pay bills. SW did encourage the patient to utilize other resources from family and friends, but but the patient state that she does not have any family here to assist and that she uses [place] or [address].  SW will reach out  to the PCP to check the status of the Part B form .          No further fol  Please call the care guide team at [phone] if you need to cancel or reschedule your appointment.   If you are experiencing a Mental Health or Behavioral Health Crisis or need someone to talk to, please call the Suicide and Crisis Lifeline: 988 go to [facility] [address], [place] [phone]) call 911  Patient verbalizes understanding of instructions and care plan provided today and agrees to view in MyChart. Active MyChart status and patient understanding of how to access instructions and care plan via MyChart confirmed with patient.     Jeanie Cooks, PhD [facility], [facility] Social Worker Direct Dial: [phone]  Fax: [phone]

## 2023-06-26 DIAGNOSIS — M25561 Pain in right knee: Secondary | ICD-10-CM | POA: Diagnosis not present

## 2023-06-26 DIAGNOSIS — M5442 Lumbago with sciatica, left side: Secondary | ICD-10-CM | POA: Diagnosis not present

## 2023-06-26 DIAGNOSIS — Z5320 Procedure and treatment not carried out because of patient's decision for unspecified reasons: Secondary | ICD-10-CM | POA: Diagnosis not present

## 2023-06-26 DIAGNOSIS — M25562 Pain in left knee: Secondary | ICD-10-CM | POA: Diagnosis not present

## 2023-06-26 DIAGNOSIS — M129 Arthropathy, unspecified: Secondary | ICD-10-CM | POA: Diagnosis not present

## 2023-06-26 DIAGNOSIS — E119 Type 2 diabetes mellitus without complications: Secondary | ICD-10-CM | POA: Diagnosis not present

## 2023-06-26 DIAGNOSIS — E559 Vitamin D deficiency, unspecified: Secondary | ICD-10-CM | POA: Diagnosis not present

## 2023-06-26 DIAGNOSIS — M25552 Pain in left hip: Secondary | ICD-10-CM | POA: Diagnosis not present

## 2023-06-26 DIAGNOSIS — G8929 Other chronic pain: Secondary | ICD-10-CM | POA: Diagnosis not present

## 2023-06-26 DIAGNOSIS — Z79899 Other long term (current) drug therapy: Secondary | ICD-10-CM | POA: Diagnosis not present

## 2023-06-27 ENCOUNTER — Ambulatory Visit: Payer: 59 | Admitting: Podiatry

## 2023-06-28 DIAGNOSIS — Z79899 Other long term (current) drug therapy: Secondary | ICD-10-CM | POA: Diagnosis not present

## 2023-06-29 ENCOUNTER — Encounter: Payer: Self-pay | Admitting: Obstetrics and Gynecology

## 2023-06-29 ENCOUNTER — Ambulatory Visit: Payer: 59 | Admitting: Obstetrics and Gynecology

## 2023-06-29 VITALS — BP 126/86 | HR 94 | Wt 179.0 lb

## 2023-06-29 DIAGNOSIS — N950 Postmenopausal bleeding: Secondary | ICD-10-CM

## 2023-06-29 NOTE — Patient Instructions (Signed)
Our surgery scheduler Serita Sheller will call you to schedule your surgery likely in January. Please see your primary care doctor to be cleared for surgery.

## 2023-06-29 NOTE — Progress Notes (Signed)
   RETURN GYNECOLOGY VISIT  Subjective:  Hannah Young is a 57 y.o. menopausal G6P4 presenting for postmenopausal bleeding.   Has been having irregular bleeding for several years. Last normal period well over a year ago c/w menopause. Had [place] w/ thickened endometrium and was planning hysteroscopy D&C for sampling with Dr. Alysia Penna but did not get pre op clearance. She returns today to coordinate next steps. Last episode of bleeding was 4 months ago. Does not want in office sampling due to pain with office biopsy in the past.   History is most notable for uncontrolled T2DM (A1c 8.2), autoimmune hepatitis on prednisone and mercaptopurine, HTN, and OSA. Also has limited mobility of knees/hips due to OA but is ambulatory  I personally reviewed: - Pelvic US 10/30/22 6.9 x 3.1 x 3.7cm uterus with EL 7mm and 13 x 10 x 12mm heterogenous endometrial mass. Normal ovaries - CT A/P 05/14/23 unremarkable uterus/adnexa, possible cirrhosis - PCP note 06/01/23 - Hgb A1c 06/01/23 8.2 - CMP AST 128, ALT 107  Objective:   Vitals:   06/29/23 0904  BP: 126/86  Pulse: 94  Weight: 179 lb (81.2 kg)    General:  Alert, oriented and cooperative. Patient is in no acute distress.  Skin: Skin is warm and dry. No rash noted.   Cardiovascular: Normal heart rate noted  Respiratory: Normal respiratory effort, no problems with respiration noted  Abdomen: Soft, non-tender, non-distended    Assessment and Plan:  Hannah Young is a 57 y.o. with PMB  1. PMB (postmenopausal bleeding) Discussed ddx includes but is not limited to atrophy, polyp, fibroid, endometrial hyperplasia, endometrial cancer Given persistent bleeding and US findings, endometrial sampling needs to be prioritized. I offered office biopsy today with intracervical lidocaine for pain control, but she strongly declines Plan for OR hysteroscopy D&C - Risks of surgery include but are not limited to: bleeding, infection, uterine perforation with injury to  bowel/bladder/blood vessels, fluid overload, electrolyte abnormalities, need for additional procedures, VTE, anesthesia reaction or medical complications like MI/CVA/death - We discussed postop restrictions, precautions and expectations - Preop testing per anesthesia. Will need pre op clearance from PCP due to medical history - messaged routed - All questions answered  - US PELVIC COMPLETE WITH TRANSVAGINAL; Future  Future Appointments  Date Time Provider Department Center  07/03/2023 11:00 AM Louann Sjogren, [place] [facility]  07/18/2023  9:15 AM Morene Crocker, MD [facility]  08/13/2023  8:00 AM Anson Fret, MD [facility] None    Lennart Pall, MD

## 2023-07-03 ENCOUNTER — Ambulatory Visit: Payer: 59 | Admitting: Podiatry

## 2023-07-03 ENCOUNTER — Encounter: Payer: 59 | Admitting: Student

## 2023-07-09 ENCOUNTER — Ambulatory Visit (HOSPITAL_COMMUNITY): Payer: 59

## 2023-07-11 ENCOUNTER — Other Ambulatory Visit: Payer: Self-pay | Admitting: Internal Medicine

## 2023-07-11 DIAGNOSIS — E1169 Type 2 diabetes mellitus with other specified complication: Secondary | ICD-10-CM

## 2023-07-18 ENCOUNTER — Encounter: Payer: 59 | Admitting: Student

## 2023-07-18 ENCOUNTER — Telehealth: Payer: Self-pay

## 2023-07-18 NOTE — Telephone Encounter (Signed)
Called patient to schedule surgery with Dr. Berton Lan. Unable to leave voicemail due to mailbox being full.

## 2023-07-23 ENCOUNTER — Other Ambulatory Visit: Payer: Self-pay | Admitting: Student

## 2023-07-23 DIAGNOSIS — Z794 Long term (current) use of insulin: Secondary | ICD-10-CM

## 2023-07-30 ENCOUNTER — Other Ambulatory Visit: Payer: Self-pay | Admitting: Internal Medicine

## 2023-07-30 DIAGNOSIS — Z77120 Contact with and (suspected) exposure to mold (toxic): Secondary | ICD-10-CM

## 2023-07-30 NOTE — Telephone Encounter (Signed)
The last 3 appts have been canceled.

## 2023-07-31 ENCOUNTER — Encounter: Payer: 59 | Admitting: Internal Medicine

## 2023-08-09 ENCOUNTER — Encounter: Payer: Self-pay | Admitting: Obstetrics and Gynecology

## 2023-08-13 ENCOUNTER — Encounter: Payer: Self-pay | Admitting: Neurology

## 2023-08-13 ENCOUNTER — Institutional Professional Consult (permissible substitution): Payer: 59 | Admitting: Neurology

## 2023-08-21 ENCOUNTER — Telehealth: Payer: Self-pay

## 2023-08-21 NOTE — Telephone Encounter (Signed)
 Called patient to schedule surgery w/ Dr. Berton Lan on 09/17/23 or  10/01/23. Left voicemail asking for patient to call me back at [phone].

## 2023-09-24 ENCOUNTER — Other Ambulatory Visit: Payer: Self-pay | Admitting: Student

## 2023-09-24 DIAGNOSIS — Z794 Long term (current) use of insulin: Secondary | ICD-10-CM

## 2023-09-24 NOTE — Telephone Encounter (Signed)
 Medication sent to pharmacy

## 2023-09-26 ENCOUNTER — Other Ambulatory Visit: Payer: Self-pay | Admitting: Student

## 2023-09-26 DIAGNOSIS — I10 Essential (primary) hypertension: Secondary | ICD-10-CM

## 2023-10-04 ENCOUNTER — Telehealth: Payer: Self-pay | Admitting: *Deleted

## 2023-10-04 NOTE — Telephone Encounter (Signed)
 Patient was identified as falling into the [place] Measure - Diabetes.   Patient was: Left voicemail to schedule with primary care provider.

## 2023-10-04 NOTE — Telephone Encounter (Signed)
 Patient was identified as falling into the [place] Measure - Diabetes.   Patient was: Appointment scheduled with primary care provider in the next 30 days.

## 2023-10-11 ENCOUNTER — Telehealth: Payer: Self-pay

## 2023-10-11 ENCOUNTER — Other Ambulatory Visit: Payer: Self-pay

## 2023-10-11 DIAGNOSIS — E119 Type 2 diabetes mellitus without complications: Secondary | ICD-10-CM

## 2023-10-11 MED ORDER — METFORMIN HCL ER 500 MG PO TB24: 1000.0000 mg | ORAL_TABLET | Freq: Two times a day (BID) | ORAL | 3 refills | Status: DC

## 2023-10-11 NOTE — Telephone Encounter (Signed)
 Medication sent to pharmacy

## 2023-10-11 NOTE — Telephone Encounter (Signed)
 Third attempt to reach patient to schedule surgery w/ Dr. Berton Lan. Voicemail is full and unable to accept messages.

## 2023-10-23 ENCOUNTER — Encounter: Payer: Self-pay | Admitting: *Deleted

## 2023-10-23 ENCOUNTER — Ambulatory Visit: Payer: Self-pay | Admitting: Student

## 2023-10-23 ENCOUNTER — Other Ambulatory Visit: Payer: Self-pay | Admitting: Student

## 2023-10-23 DIAGNOSIS — Z77120 Contact with and (suspected) exposure to mold (toxic): Secondary | ICD-10-CM

## 2023-10-23 NOTE — Telephone Encounter (Signed)
 Medication sent to pharmacy

## 2023-10-23 NOTE — Telephone Encounter (Signed)
 Summary: worsen symptoms   Copied From CRM [id number]. Reason for Triage: patient have worsen system , back ,hips, knees , feel like joints on fire Patient was trying to get an appointment for a pre op surgery exam [phone]     1st attempt--No answer at [phone] & message saying "this number is not in service"--this is the number in the notes from CRM ------Also called [phone] with no answer and the mailbox was full unable to leave a voicemail.

## 2023-10-23 NOTE — Telephone Encounter (Signed)
 I called pt - no answer; also states "telephone # not in service". I sent pt a My Chart message.

## 2023-10-23 NOTE — Telephone Encounter (Signed)
 2nd attempt. No answer, not available.   Message from Dennison Nancy sent at 10/23/2023 10:59 AM EDT  Summary: worsen symptoms   Copied From CRM [id number]. Reason for Triage: patient have worsen system , back ,hips, knees , feel like joints on fire Patient was trying to get an appointment for a pre op surgery exam [phone]

## 2023-10-23 NOTE — Telephone Encounter (Signed)
 3rd attempt. Voicemail is full, unable to leave message. Routing for follow up.   Message from Dennison Nancy sent at 10/23/2023 10:59 AM EDT  Summary: worsen symptoms   Copied From CRM [id number]. Reason for Triage: patient have worsen system , back ,hips, knees , feel like joints on fire Patient was trying to get an appointment for a pre op surgery exam [phone]

## 2023-10-30 ENCOUNTER — Encounter: Payer: 59 | Admitting: Student

## 2023-10-30 ENCOUNTER — Other Ambulatory Visit: Payer: Self-pay | Admitting: Student

## 2023-10-30 DIAGNOSIS — Z1231 Encounter for screening mammogram for malignant neoplasm of breast: Secondary | ICD-10-CM

## 2023-10-30 NOTE — Progress Notes (Signed)
 Patient unable to make appointment today, she will be due for screening mammo.  Last mammogram BI-RADS 1, will place order for screening mammogram.

## 2023-11-08 ENCOUNTER — Ambulatory Visit: Payer: Self-pay | Admitting: Student

## 2023-11-12 ENCOUNTER — Other Ambulatory Visit: Payer: Self-pay | Admitting: Student

## 2023-11-12 DIAGNOSIS — J4520 Mild intermittent asthma, uncomplicated: Secondary | ICD-10-CM

## 2023-11-20 ENCOUNTER — Ambulatory Visit (INDEPENDENT_AMBULATORY_CARE_PROVIDER_SITE_OTHER): Admitting: Orthopaedic Surgery

## 2023-11-20 ENCOUNTER — Ambulatory Visit (INDEPENDENT_AMBULATORY_CARE_PROVIDER_SITE_OTHER): Admitting: Sports Medicine

## 2023-11-20 ENCOUNTER — Encounter: Payer: Self-pay | Admitting: Orthopaedic Surgery

## 2023-11-20 ENCOUNTER — Encounter: Payer: Self-pay | Admitting: Sports Medicine

## 2023-11-20 ENCOUNTER — Other Ambulatory Visit: Payer: Self-pay

## 2023-11-20 DIAGNOSIS — M25552 Pain in left hip: Secondary | ICD-10-CM | POA: Diagnosis not present

## 2023-11-20 DIAGNOSIS — M1612 Unilateral primary osteoarthritis, left hip: Secondary | ICD-10-CM

## 2023-11-20 MED ORDER — LIDOCAINE HCL 1 % IJ SOLN
4.0000 mL | INTRAMUSCULAR | Status: AC | PRN
  Administered 2023-11-20: 4 mL

## 2023-11-20 MED ORDER — METHYLPREDNISOLONE ACETATE 40 MG/ML IJ SUSP
40.0000 mg | INTRAMUSCULAR | Status: AC | PRN
  Administered 2023-11-20: 40 mg via INTRA_ARTICULAR

## 2023-11-20 NOTE — Progress Notes (Signed)
 Office Visit Note   Patient: Hannah Young           Date of Birth: 02/28/1966           MRN: [id number] Visit Date: 11/20/2023              Requested by: Morene Crocker, MD [address] [place],  [place] [address] PCP: Morene Crocker, MD   Assessment & Plan: Visit Diagnoses:  1. Primary osteoarthritis of left hip     Plan: Hannah Young is a 58 year old female with end-stage left hip DJD.  Self-admittedly Hannah Young says Hannah Young has made little progress on lowering her A1c.  Hannah Young has been under a lot of stress recently.  Hannah Young is not a surgical candidate at this time.  We will send her to Dr. Shon Baton for a hip injection.  Follow-Up Instructions: No follow-ups on file.   Orders:  No orders of the defined types were placed in this encounter.  No orders of the defined types were placed in this encounter.     Procedures: No procedures performed   Clinical Data: No additional findings.   Subjective: Chief Complaint  Patient presents with   Left Hip - Pain    HPI Hannah Young comes in today for follow-up evaluation of left hip pain.  Hannah Young is reporting nighttime pain.  Has difficulty walking and frequently limps.  Recently been affected. Review of Systems  Constitutional: Negative.   HENT: Negative.    Eyes: Negative.   Respiratory: Negative.    Cardiovascular: Negative.   Endocrine: Negative.   Musculoskeletal: Negative.   Neurological: Negative.   Hematological: Negative.   Psychiatric/Behavioral: Negative.    All other systems reviewed and are negative.    Objective: Vital Signs: There were no vitals taken for this visit.  Physical Exam Vitals and nursing note reviewed.  Constitutional:      Appearance: Hannah Young is well-developed.  HENT:     Head: Normocephalic and atraumatic.  Pulmonary:     Effort: Pulmonary effort is normal.  Abdominal:     Palpations: Abdomen is soft.  Musculoskeletal:     Cervical back: Neck supple.  Skin:    General: Skin is warm.      Capillary Refill: Capillary refill takes less than 2 seconds.  Neurological:     Mental Status: Hannah Young is alert and oriented to person, place, and time.  Psychiatric:        Behavior: Behavior normal.        Thought Content: Thought content normal.        Judgment: Judgment normal.     Ortho Exam Exam of the left hip is unchanged from prior visit. Specialty Comments:  No specialty comments available.  Imaging: No results found.   PMFS History: Patient Active Problem List   Diagnosis Date Noted   On prednisone therapy 06/01/2023   Type 2 diabetes mellitus without complication, with long-term current use of insulin (HCC) 06/01/2023   Epigastric pain 06/01/2023   OSA (obstructive sleep apnea) 06/01/2023   Insomnia 01/03/2023   Morbid obesity (HCC) 01/02/2023   PMB (postmenopausal bleeding) 11/09/2022   Endometrial thickening on ultrasound 11/09/2022   HLD (hyperlipidemia) 08/26/2022   Primary osteoarthritis of left hip 02/02/2022   Primary osteoarthritis of left knee 02/02/2022   Mold exposure 09/06/2021   Alopecia 10/21/2020   Chronic right shoulder pain 06/02/2020   Bilateral occipital neuralgia 05/01/2020   Chronic neck pain 05/01/2020   Arthralgia 03/29/2020   Osteoarthritis 02/12/2020   Healthcare  maintenance 04/29/2018   Multiple lung nodules on CT 01/25/2017   Moderate recurrent major depression (HCC) 01/25/2017   Autoimmune hepatitis (HCC) 01/30/2012   Type 2 diabetes mellitus with other specified complication (HCC) 01/30/2012   Asthma 01/30/2012   Hypertension 01/30/2012   Past Medical History:  Diagnosis Date   Anxiety    Arthritis    Asthma    Autoimmune hepatitis (HCC)    Bipolar 1 disorder (HCC)    Breast lump    Depression    Diabetes mellitus    FH: chemotherapy    Gout    Hair loss 10/21/2020   Headache 02/01/2021   High cholesterol    Housing situation unstable 03/22/2021   Hypertension    Liver cirrhosis (HCC)    Lung nodules     Neuropathy    Panic disorder    STD exposure 02/23/2021   Vaginal discharge 01/25/2017   Vertigo     Family History  Problem Relation Age of Onset   Liver disease Mother    Kidney failure Father    Heart disease Sister    Liver disease Daughter    Anxiety disorder Daughter    Heart disease Son    Colon cancer Neg Hx    Stomach cancer Neg Hx    Migraines Neg Hx    Headache Neg Hx     Past Surgical History:  Procedure Laterality Date   Biopsy of liver     CESAREAN SECTION     LAPAROSCOPY     TUBAL LIGATION     Social History   Occupational History   Not on file  Tobacco Use   Smoking status: Former    Current packs/day: 0.00    Types: Cigarettes    Quit date: 08/22/2020    Years since quitting: 3.2   Smokeless tobacco: Never   Tobacco comments:    2-3 cigs/day  Vaping Use   Vaping status: Never Used  Substance and Sexual Activity   Alcohol use: Not Currently    Alcohol/week: 3.0 standard drinks of alcohol    Types: 3 Cans of beer per week   Drug use: No   Sexual activity: Not Currently    Birth control/protection: Surgical    Comment: States Hannah Young is not currently active

## 2023-11-20 NOTE — Progress Notes (Signed)
   Procedure Note  Patient: Hannah Young             Date of Birth: 11/22/1965           MRN: [id number]             Visit Date: 11/20/2023  Procedures: Visit Diagnoses:  1. Pain in left hip   2. Primary osteoarthritis of left hip    Large Joint Inj: L hip joint on 11/20/2023 8:12 AM Indications: pain Details: 22 G 3.5 in needle, ultrasound-guided anterior approach Medications: 4 mL lidocaine 1 %; 40 mg methylPREDNISolone acetate 40 MG/ML Outcome: tolerated well, no immediate complications  Procedure: US-guided intra-articular hip injection, left After discussion on risks/benefits/indications and informed verbal consent was obtained, a timeout was performed. Patient was lying supine on exam table. The hip was cleaned with betadine and alcohol swabs. Then utilizing ultrasound guidance, the patient's femoral head and neck junction was identified and subsequently injected with 4:1 lidocaine:depomedrol via an in-plane approach with ultrasound visualization of the injectate administered into the hip joint. Patient tolerated procedure well without immediate complications.  Procedure, treatment alternatives, risks and benefits explained, specific risks discussed. Consent was given by the patient. Immediately prior to procedure a time out was called to verify the correct patient, procedure, equipment, support staff and site/side marked as required. Patient was prepped and draped in the usual sterile fashion.     - post-injection protocol discussed - follow-up with Dr. Roda Shutters as indicated; I am happy to see her as needed  Madelyn Brunner, DO Primary Care Sports Medicine Physician  [facility]  This note was dictated using Dragon naturally speaking software and may contain errors in syntax, spelling, or content which have not been identified prior to signing this note.

## 2023-11-21 ENCOUNTER — Ambulatory Visit: Payer: Self-pay

## 2023-11-21 ENCOUNTER — Other Ambulatory Visit: Payer: Self-pay | Admitting: Student

## 2023-11-21 DIAGNOSIS — F32A Depression, unspecified: Secondary | ICD-10-CM

## 2023-11-21 NOTE — Telephone Encounter (Signed)
  Chief Complaint: chest tightness Symptoms: chest tightness when inhaling, cough, runny nose Frequency: since yesterday Pertinent Negatives: Patient denies fever, dizziness, sob Disposition: [] ED /[] Urgent Care (no appt availability in office) / [x] Appointment(In office/virtual)/ []  [facility] [place] [facility]/ [] Home Care/ [x] Refused Recommended Disposition /[] [place] [facility]/ []  Follow-up with PCP Additional Notes: Patient reports she was recently around someone who was diagnosed with pneumonia and believes she may be developing it as well. Patient reports that she has been experiencing cough, runny nose, and tightness in her chest when inhaling since yesterday. Patient reports this tightness in her chest comes and goes and is only when she is taking a breath in. Patient reports she has had pneumonia in the past and would like an antibiotic called in. Advised patient we would need to schedule an appt for further eval, patient states she does not want to schedule right now and will call back at a later time to schedule. Advised patient to call back with worsening symptoms. Patient verbalized understanding.     Copied from CRM [id number]. Topic: Clinical - Red Word Triage >> Nov 21, 2023 10:11 AM Adrianna P wrote: Red Word that prompted transfer to Nurse Triage: chest is tight Reason for Disposition  [1] Chest pain lasting < 5 minutes AND [2] has not taken prescribed nitroglycerin  Answer Assessment - Initial Assessment Questions 1. LOCATION: "Where does it hurt?"       Middle chest 2. RADIATION: "Does the pain go anywhere else?" (e.g., into neck, jaw, arms, back)     none 3. ONSET: "When did the chest pain begin?" (Minutes, hours or days)      yesterday 4. PATTERN: "Does the pain come and go, or has it been constant since it started?"  "Does it get worse with exertion?"      Comes and goes 5. DURATION: "How long does it last" (e.g., seconds, minutes, hours)     seconds 6. SEVERITY: "How  bad is the pain?"  (e.g., Scale 1-10; mild, moderate, or severe)    - MILD (1-3): doesn't interfere with normal activities     - MODERATE (4-7): interferes with normal activities or awakens from sleep    - SEVERE (8-10): excruciating pain, unable to do any normal activities       mild 7. CARDIAC RISK FACTORS: "Do you have any history of heart problems or risk factors for heart disease?" (e.g., angina, prior heart attack; diabetes, high blood pressure, high cholesterol, smoker, or strong family history of heart disease)     none 8. PULMONARY RISK FACTORS: "Do you have any history of lung disease?"  (e.g., blood clots in lung, asthma, emphysema, birth control pills)     Lung nodes 9. CAUSE: "What do you think is causing the chest pain?"     unsure 10. OTHER SYMPTOMS: "Do you have any other symptoms?" (e.g., dizziness, nausea, vomiting, sweating, fever, difficulty breathing, cough)     cough  Protocols used: Chest Pain-A-AH

## 2023-12-12 ENCOUNTER — Ambulatory Visit: Payer: Self-pay | Admitting: Student

## 2024-01-04 ENCOUNTER — Telehealth: Payer: Self-pay | Admitting: Orthopaedic Surgery

## 2024-01-04 NOTE — Telephone Encounter (Signed)
 Patient called and said could he put in for a shower chair and potty chair as well. [phone]

## 2024-01-04 NOTE — Telephone Encounter (Signed)
 Spoke with patient. Order has been sent to [facility] via [place]. Patient was made aware to look for a text from [employer] [facility] with updates on order and delivery.

## 2024-01-04 NOTE — Telephone Encounter (Signed)
 yes

## 2024-01-04 NOTE — Telephone Encounter (Signed)
 Ok to send

## 2024-01-05 ENCOUNTER — Emergency Department (HOSPITAL_COMMUNITY)
Admission: EM | Admit: 2024-01-05 | Discharge: 2024-01-05 | Disposition: A | Discharge status: Home / Self Care | Attending: Emergency Medicine | Admitting: Emergency Medicine

## 2024-01-05 ENCOUNTER — Encounter (HOSPITAL_COMMUNITY): Payer: Self-pay

## 2024-01-05 ENCOUNTER — Emergency Department (HOSPITAL_COMMUNITY)

## 2024-01-05 ENCOUNTER — Other Ambulatory Visit: Payer: Self-pay

## 2024-01-05 ENCOUNTER — Telehealth: Payer: Self-pay

## 2024-01-05 DIAGNOSIS — Z79899 Other long term (current) drug therapy: Secondary | ICD-10-CM | POA: Diagnosis not present

## 2024-01-05 DIAGNOSIS — Z91040 Latex allergy status: Secondary | ICD-10-CM | POA: Insufficient documentation

## 2024-01-05 DIAGNOSIS — I10 Essential (primary) hypertension: Secondary | ICD-10-CM | POA: Diagnosis not present

## 2024-01-05 DIAGNOSIS — Z7984 Long term (current) use of oral hypoglycemic drugs: Secondary | ICD-10-CM | POA: Insufficient documentation

## 2024-01-05 DIAGNOSIS — M25552 Pain in left hip: Secondary | ICD-10-CM | POA: Insufficient documentation

## 2024-01-05 DIAGNOSIS — E119 Type 2 diabetes mellitus without complications: Secondary | ICD-10-CM | POA: Diagnosis not present

## 2024-01-05 DIAGNOSIS — R0789 Other chest pain: Secondary | ICD-10-CM | POA: Insufficient documentation

## 2024-01-05 DIAGNOSIS — Z794 Long term (current) use of insulin: Secondary | ICD-10-CM | POA: Insufficient documentation

## 2024-01-05 DIAGNOSIS — R059 Cough, unspecified: Secondary | ICD-10-CM | POA: Diagnosis present

## 2024-01-05 DIAGNOSIS — R300 Dysuria: Secondary | ICD-10-CM | POA: Insufficient documentation

## 2024-01-05 DIAGNOSIS — R0600 Dyspnea, unspecified: Secondary | ICD-10-CM

## 2024-01-05 DIAGNOSIS — R0989 Other specified symptoms and signs involving the circulatory and respiratory systems: Secondary | ICD-10-CM | POA: Diagnosis not present

## 2024-01-05 DIAGNOSIS — R052 Subacute cough: Secondary | ICD-10-CM | POA: Insufficient documentation

## 2024-01-05 DIAGNOSIS — R079 Chest pain, unspecified: Secondary | ICD-10-CM | POA: Diagnosis not present

## 2024-01-05 LAB — CBC
HCT: 36.1 % (ref 36.0–46.0)
Hemoglobin: 12 g/dL (ref 12.0–15.0)
MCH: 33.8 pg (ref 26.0–34.0)
MCHC: 33.2 g/dL (ref 30.0–36.0)
MCV: 101.7 fL — ABNORMAL HIGH (ref 80.0–100.0)
Platelets: 331 10*3/uL (ref 150–400)
RBC: 3.55 MIL/uL — ABNORMAL LOW (ref 3.87–5.11)
RDW: 12.6 % (ref 11.5–15.5)
WBC: 7.3 10*3/uL (ref 4.0–10.5)
nRBC: 0 % (ref 0.0–0.2)

## 2024-01-05 LAB — BASIC METABOLIC PANEL WITH GFR
Anion gap: 10 (ref 5–15)
BUN: 13 mg/dL (ref 6–20)
CO2: 25 mmol/L (ref 22–32)
Calcium: 9.3 mg/dL (ref 8.9–10.3)
Chloride: 101 mmol/L (ref 98–111)
Creatinine, Ser: 0.53 mg/dL (ref 0.44–1.00)
GFR, Estimated: >60 mL/min (ref >60)
Glucose, Bld: 206 mg/dL — ABNORMAL HIGH (ref 70–99)
Potassium: 3.9 mmol/L (ref 3.5–5.1)
Sodium: 136 mmol/L (ref 135–145)

## 2024-01-05 LAB — URINALYSIS, ROUTINE W REFLEX MICROSCOPIC
Bilirubin Urine: NEGATIVE
Glucose, UA: NEGATIVE mg/dL
Hgb urine dipstick: NEGATIVE
Ketones, ur: NEGATIVE mg/dL
Leukocytes,Ua: NEGATIVE
Nitrite: NEGATIVE
Protein, ur: NEGATIVE mg/dL
Specific Gravity, Urine: 1.016 (ref 1.005–1.030)
pH: 5 (ref 5.0–8.0)

## 2024-01-05 LAB — TROPONIN I (HIGH SENSITIVITY): Troponin I (High Sensitivity): 3 ng/L (ref <18)

## 2024-01-05 MED ORDER — AZITHROMYCIN 250 MG PO TABS: ORAL_TABLET | ORAL | 0 refills | Status: AC

## 2024-01-05 MED ORDER — PREDNISONE 10 MG PO TABS: 20.0000 mg | ORAL_TABLET | Freq: Every day | ORAL | 0 refills | Status: AC

## 2024-01-05 NOTE — ED Provider Notes (Signed)
 [place] [facility]ENC[facility] DEPARTMENT AT [facility] Provider Note   CSN: [id number] Arrival date & time: 01/05/24  4098     History  Chief Complaint  Patient presents with   Chest Pain   Hip Pain   Flank Pain    Hannah Young is a 58 y.o. female.  Patient to ED with multiple complaints today. She reports waking at night multiple times with SOB. She has been referred for testing for sleep apnea and states her current symptoms are progressive over a long period of time. She also reports a cough that is productive over the last 2 weeks with chest tightness. No fever. No difficulty breathing. She denies vomiting or change in bowel movements. She also reports some discomfort when urinating. No hematuria. She feels she drinks enough water to stay hydrated. She has osteoarthritis of her left hip awaiting surgery and feels she avoids getting up to the bathroom because of pain with walking.    Chest Pain Hip Pain Associated symptoms include chest pain.  Flank Pain Associated symptoms include chest pain.       Home Medications Prior to Admission medications   Medication Sig Start Date End Date Taking? Authorizing Provider  azithromycin  (ZITHROMAX  Z-PAK) 250 MG tablet Take 2 tablets on day one and 1 tablet each of the following 4 days 01/05/24  Yes Curry Seefeldt, PA-C  predniSONE  (DELTASONE ) 10 MG tablet Take 2 tablets (20 mg total) by mouth daily. 01/05/24  Yes Mandy Second, PA-C  Accu-Chek Softclix Lancets lancets Use as instructed to check blood sugar 3x a day 06/25/23   Cathey Clunes, MD  albuterol  (VENTOLIN  HFA) 108 (90 Base) MCG/ACT inhaler INHALE ONE TO TWO puffs into THE lungs EVERY SIX HOURS AS NEEDED SHORTNESS OF BREATH 11/12/23   Cathey Clunes, MD  blood glucose meter kit and supplies Dispense based on patient and insurance preference. Use up to four times daily as directed. (FOR ICD-10 E10.9, E11.9). 06/01/23   Sheree Dieter, MD  Blood Glucose  Monitoring Suppl (ACCU-CHEK GUIDE ME) w/Device KIT Use 3 times per day to check your blood sugar. 08/25/22   Cathey Clunes, MD  Continuous Glucose Receiver (DEXCOM G7 RECEIVER) DEVI Use with Dexcom G7 sensors to monitor your glucose continuously. 07/11/23   Cathey Clunes, MD  Continuous Glucose Sensor (DEXCOM G7 SENSOR) MISC Apply once every 10 day as instructed 09/24/23   Cathey Clunes, MD  fluticasone  (FLONASE ) 50 MCG/ACT nasal spray Place 2 sprays into both nostrils daily. 10/23/23   Cathey Clunes, MD  glucose blood (ACCU-CHEK GUIDE) test strip Use as instructed to check blood sugar 3 times a day 06/25/23   Cathey Clunes, MD  insulin  glargine (LANTUS  SOLOSTAR) 100 UNIT/ML Solostar Pen Inject 15 Units into the skin at bedtime. 01/02/23   Barnetta Liberty, MD  insulin  lispro (HUMALOG ) 100 UNIT/ML injection Inject 0.05 mLs (5 Units total) into the skin 3 (three) times daily with meals. 01/02/23   Barnetta Liberty, MD  Insulin  Pen Needle (SURE COMFORT PEN NEEDLES) 32G X 6 MM MISC USE ONE needle IN THE MORNING, NOON, AND IN THE EVENING 07/23/23   Cathey Clunes, MD  lisinopril  (ZESTRIL ) 10 MG tablet TAKE ONE TABLET BY MOUTH EVERY DAY 09/27/23   Cathey Clunes, MD  mercaptopurine  (PURINETHOL ) 50 MG tablet Take 50 mg by mouth daily. Give on an empty stomach 1 hour before or 2 hours after meals. Caution: Chemotherapy.    [provider]  metFORMIN  (GLUCOPHAGE -XR) 500 MG 24 hr tablet Take 2  tablets (1,000 mg total) by mouth 2 (two) times daily with a meal. 10/11/23 10/05/24  Cathey Clunes, MD  oxyCODONE  (OXY IR/ROXICODONE ) 5 MG immediate release tablet Take 1 tablet (5 mg total) by mouth every 6 (six) hours as needed for severe pain or breakthrough pain. 12/26/22   Brooks, Dana, DO  pantoprazole  (PROTONIX ) 40 MG tablet Take 1 tablet (40 mg total) by mouth 2 (two) times daily. 06/01/23 05/31/24  Sheree Dieter, MD  rosuvastatin  (CRESTOR ) 20  MG tablet Take 1 tablet (20 mg total) by mouth daily. 06/05/23 06/04/24  Cathey Clunes, MD  Semaglutide ,0.25 or 0.5MG /DOS, (OZEMPIC , 0.25 OR 0.5 MG/DOSE,) 2 MG/3ML SOPN Inject 0.25 mg into the skin once a week. 06/01/23   Sheree Dieter, MD  venlafaxine  XR (EFFEXOR -XR) 75 MG 24 hr capsule Take 1 capsule (75 mg total) by mouth daily with breakfast. 11/22/23 11/16/24  Cathey Clunes, MD      Allergies    Amoxicillin , Augmentin  [amoxicillin -pot clavulanate], Tylenol  [acetaminophen ], and Latex    Review of Systems   Review of Systems  Cardiovascular:  Positive for chest pain.  Genitourinary:  Positive for flank pain.    Physical Exam Updated Vital Signs BP 132/74 (BP Location: Left Arm)   Pulse 66   Temp 97.8 F (36.6 C)   Resp 17   Ht 5' (1.524 m)   SpO2 100%   BMI 34.96 kg/m  Physical Exam Constitutional:      Appearance: She is well-developed. She is obese.  HENT:     Head: Normocephalic.  Cardiovascular:     Rate and Rhythm: Normal rate and regular rhythm.     Heart sounds: No murmur heard. Pulmonary:     Effort: Pulmonary effort is normal.     Breath sounds: Normal breath sounds. No wheezing, rhonchi or rales.  Abdominal:     General: Bowel sounds are normal.     Palpations: Abdomen is soft.     Tenderness: There is no abdominal tenderness. There is no guarding or rebound.  Musculoskeletal:        General: Normal range of motion.     Cervical back: Normal range of motion and neck supple.     Right lower leg: No edema.     Left lower leg: No edema.  Skin:    General: Skin is warm and dry.  Neurological:     General: No focal deficit present.     Mental Status: She is alert and oriented to person, place, and time.     ED Results / Procedures / Treatments   Labs (all labs ordered are listed, but only abnormal results are displayed) Labs Reviewed  BASIC METABOLIC PANEL WITH GFR - Abnormal; Notable for the following components:      Result Value    Glucose, Bld 206 (*)    All other components within normal limits  CBC - Abnormal; Notable for the following components:   RBC 3.55 (*)    MCV 101.7 (*)    All other components within normal limits  URINALYSIS, ROUTINE W REFLEX MICROSCOPIC  TROPONIN I (HIGH SENSITIVITY)  TROPONIN I (HIGH SENSITIVITY)   Results for orders placed or performed during the hospital encounter of 01/05/24  Urinalysis, Routine w reflex microscopic -   Collection Time: 01/05/24  9:56 AM  Result Value Ref Range   Color, Urine YELLOW YELLOW   APPearance CLEAR CLEAR   Specific Gravity, Urine 1.016 1.005 - 1.030   pH 5.0 5.0 - 8.0   Glucose, UA  NEGATIVE NEGATIVE mg/dL   Hgb urine dipstick NEGATIVE NEGATIVE   Bilirubin Urine NEGATIVE NEGATIVE   Ketones, ur NEGATIVE NEGATIVE mg/dL   Protein, ur NEGATIVE NEGATIVE mg/dL   Nitrite NEGATIVE NEGATIVE   Leukocytes,Ua NEGATIVE NEGATIVE  Basic metabolic panel   Collection Time: 01/05/24 10:02 AM  Result Value Ref Range   Sodium 136 135 - 145 mmol/L   Potassium 3.9 3.5 - 5.1 mmol/L   Chloride 101 98 - 111 mmol/L   CO2 25 22 - 32 mmol/L   Glucose, Bld 206 (H) 70 - 99 mg/dL   BUN 13 6 - 20 mg/dL   Creatinine, Ser 2.84 0.44 - 1.00 mg/dL   Calcium  9.3 8.9 - 10.3 mg/dL   GFR, Estimated >13 >24 mL/min   Anion gap 10 5 - 15  CBC   Collection Time: 01/05/24 10:02 AM  Result Value Ref Range   WBC 7.3 4.0 - 10.5 K/uL   RBC 3.55 (L) 3.87 - 5.11 MIL/uL   Hemoglobin 12.0 12.0 - 15.0 g/dL   HCT 40.1 02.7 - 25.3 %   MCV 101.7 (H) 80.0 - 100.0 fL   MCH 33.8 26.0 - 34.0 pg   MCHC 33.2 30.0 - 36.0 g/dL   RDW 66.4 40.3 - 47.4 %   Platelets 331 150 - 400 K/uL   nRBC 0.0 0.0 - 0.2 %  Troponin I (High Sensitivity)   Collection Time: 01/05/24 10:02 AM  Result Value Ref Range   Troponin I (High Sensitivity) 3 <18 ng/L     EKG EKG Interpretation Date/Time:  Saturday Jan 05 2024 09:49:47 EDT Ventricular Rate:  67 PR Interval:  136 QRS Duration:  84 QT  Interval:  404 QTC Calculation: 426 R Axis:   54  Text Interpretation: Normal sinus rhythm Normal ECG When compared with ECG of 14-May-2023 09:53, No significant change since last tracing Confirmed by Trish Furl [id number]) on 01/05/2024 10:36:21 AM  Radiology DG Chest 2 View Result Date: 01/05/2024 EXAM: 2 VIEW(S) XRAY OF THE CHEST 01/05/2024 10:21:24 AM COMPARISON: 09/28/2020 CLINICAL HISTORY: Central CP that started 2 weeks ago, no pain radiation. Also reports that her throat burns when she drinks anything. FINDINGS: LUNGS AND PLEURA: No focal pulmonary opacity. No pulmonary edema. No pleural effusion. No pneumothorax. The lung volumes are low. HEART AND MEDIASTINUM: No acute abnormality of the cardiac and mediastinal silhouettes. Atherosclerotic changes are again noted at the aortic arch. BONES AND SOFT TISSUES: No acute osseous abnormality. IMPRESSION: 1. No acute cardiopulmonary pathology. 2. Low lung volumes. Electronically signed by: Audree Leas MD 01/05/2024 10:56 AM EDT RP Workstation: [id number]    Procedures Procedures    Medications Ordered in ED Medications - No data to display  ED Course/ Medical Decision Making/ A&P                                 Medical Decision Making This patient presents to the ED for concern of Multiple complaints: These involve an extensive number of diagnosis and treatment options: Dysuria - UTI, bladder stones, kidney stones, pyelonephritis, vaginal infections SOB at night, productive cough during the day - PNA, OSA, CHF, ACS, PTX, PE Left hip pain - chronic  Co morbidities that complicate the patient evaluation  T2DM (poorly controlled), gout, HLD, HTN, cirrhosis,    Additional history obtained:  Additional history and/or information obtained from chart review, notable for  Re: hip pain: Seen by orthopedics 06/2023 Christiane Cowing) which  confirms diagnosis of left hip osteoarthritis and plan of care:   "Plan: Erminie has advanced left hip DJD  which is bone-on-bone.  Unfortunately her A1c is 8.2.  I explained that this needs to be 7.7 or less in order to proceed with surgery.  She will continue with all efforts to lower the A1c.  Her PCP would like her to continue to lose weight.  She will follow-up with us  once she has achieved a proper A1c." Follow up appointment 11/2023 notes A1c no better - sent for joint infection which she received a left hip joint infection 11/20/23.     Lab Tests:  I Ordered, and personally interpreted labs.  The pertinent results include:   CBC - no leukocytosis, normal hgb  Cmet - glucose 203, o/w WNL Troponin 3 - no delta obtained due to duration of symptoms   Imaging Studies ordered:  I ordered imaging studies including CXR I independently visualized and interpreted imaging which showed NAD I agree with the radiologist interpretation   Cardiac Monitoring:  The patient was maintained on a cardiac monitor.  I personally viewed and interpreted the cardiac monitored which showed an underlying rhythm of: NSR, no significant change   Test Considered:  N/a   Critical Interventions:  N/a   Consultations Obtained:  I requested consultation with the n/a,  and discussed lab and imaging findings as well as pertinent plan - they recommend: n/a   Problem List / ED Course:  Multiple complaints: UTI - dysuria - UA negative Cough - x 2 weeks - no PNA on CXR, no fever, no leukocytosis Left hip pain - chronic Waking at night SOB - is pending OSA testing   Reevaluation:  After the interventions noted above, I reevaluated the patient and found that they have :improved   Social Determinants of Health:  Former smoker, at risk for depression   Disposition:  After consideration of the diagnostic results and the patients response to treatment, I feel that the patient would benefit from discharge home.   Amount and/or Complexity of Data Reviewed Labs: ordered. Radiology:  ordered.           Final Clinical Impression(s) / ED Diagnoses Final diagnoses:  Subacute cough  Pain of left hip    Rx / DC Orders ED Discharge Orders          Ordered    azithromycin  (ZITHROMAX  Z-PAK) 250 MG tablet        01/05/24 1219    predniSONE  (DELTASONE ) 10 MG tablet  Daily        01/05/24 1219              Mandy Second, PA-C 01/05/24 1221    Trish Furl, MD 01/05/24 [phone]

## 2024-01-05 NOTE — ED Triage Notes (Signed)
 Pt came in via POV d/t central CP that started 2 weeks ago, no pain radiation. Also reports that her throat burns when she drinks anything. Is having moments of difficulty breathing without exertion & when she breaths her Lt flank will hurt. Does report burning with urination. Also request her Lt hip to be checked since she knows it needs surgery but her A1C has been too high for the procedure & the pain has persisted in that hip(per pt). A/Ox4, rates her pain 10/10 during triage.

## 2024-01-05 NOTE — Telephone Encounter (Signed)
 Patient called in to check her lab, she could not get mychart up. Wanted to know glucose, she is aware of her level

## 2024-01-05 NOTE — Discharge Instructions (Signed)
 Take the Zithromax  Z-Pack as prescribed. You will need to speak to your doctor regarding the shortness of breath on waking at night and continue the discussion on testing for sleep apnea. You have been started back on prednisone  20 mg once daily. You need to see your doctor for further refills.   Return to the ED with any new or worsening symptoms.

## 2024-01-08 ENCOUNTER — Other Ambulatory Visit: Payer: Self-pay | Admitting: Student

## 2024-01-08 DIAGNOSIS — M1612 Unilateral primary osteoarthritis, left hip: Secondary | ICD-10-CM | POA: Diagnosis not present

## 2024-01-08 DIAGNOSIS — E119 Type 2 diabetes mellitus without complications: Secondary | ICD-10-CM

## 2024-01-08 NOTE — Telephone Encounter (Signed)
 Patient last seen 06/01/23, I called the patient to schedule a follow up appointment. Unable to reach the patient, I lvm for her to give us  a call back.

## 2024-01-08 NOTE — Telephone Encounter (Signed)
 Spoke with the patient:  Name: Hannah Young, Hannah Young MRN: [id number]  Date: 01/15/2024 Status: Sch  Time: 10:15 AM Length: 30  Visit Type: OPEN ESTABLISHED [726] Copay: $0.00  Provider: Vicente Graham, DO       Copied from CRM [id number]. Topic: Appointments - Scheduling Inquiry for Clinic >> Jan 08, 2024 10:17 AM Blair Bumpers wrote: Reason for CRM: Patient called in stating that the hospital told her she needs to f/u with her doctor. Tried scheduling with provider or someone on the same team but no one was available. Called [facility] 3 times to speak with front desk & got no answer. Scheduled patient with another provider. If this is incorrect, please give patient a call to reschedule. She states she can't come any time this week. Patient also states she is needing a physical as well along with her A1C checked. Explained that she can schedule physical at the clinic as that is a different appt and she needs hospital f/u completed first. CB #: [phone].

## 2024-01-09 DIAGNOSIS — K754 Autoimmune hepatitis: Secondary | ICD-10-CM | POA: Diagnosis not present

## 2024-01-14 ENCOUNTER — Other Ambulatory Visit: Payer: Self-pay | Admitting: Student

## 2024-01-14 DIAGNOSIS — Z77120 Contact with and (suspected) exposure to mold (toxic): Secondary | ICD-10-CM

## 2024-01-14 NOTE — Telephone Encounter (Signed)
 Medication sent to pharmacy

## 2024-01-15 ENCOUNTER — Other Ambulatory Visit: Payer: Self-pay | Admitting: Student

## 2024-01-15 ENCOUNTER — Encounter: Admitting: Internal Medicine

## 2024-01-15 DIAGNOSIS — E785 Hyperlipidemia, unspecified: Secondary | ICD-10-CM

## 2024-02-11 ENCOUNTER — Other Ambulatory Visit: Payer: Self-pay | Admitting: Student

## 2024-02-11 DIAGNOSIS — E119 Type 2 diabetes mellitus without complications: Secondary | ICD-10-CM

## 2024-03-19 ENCOUNTER — Ambulatory Visit: Payer: Self-pay | Admitting: Student

## 2024-03-21 ENCOUNTER — Ambulatory Visit: Admitting: Orthopaedic Surgery

## 2024-04-01 ENCOUNTER — Other Ambulatory Visit: Payer: Self-pay | Admitting: Student

## 2024-04-01 DIAGNOSIS — I10 Essential (primary) hypertension: Secondary | ICD-10-CM

## 2024-04-01 NOTE — Telephone Encounter (Signed)
 Medication sent to pharmacy

## 2024-04-07 ENCOUNTER — Other Ambulatory Visit: Payer: Self-pay | Admitting: Student

## 2024-04-07 DIAGNOSIS — Z77120 Contact with and (suspected) exposure to mold (toxic): Secondary | ICD-10-CM

## 2024-04-08 NOTE — Telephone Encounter (Signed)
 Medication sent to pharmacy

## 2024-04-25 ENCOUNTER — Ambulatory Visit: Admitting: Orthopaedic Surgery

## 2024-05-05 ENCOUNTER — Encounter: Payer: Self-pay | Admitting: Internal Medicine

## 2024-05-05 ENCOUNTER — Telehealth: Payer: Self-pay | Admitting: *Deleted

## 2024-05-05 DIAGNOSIS — Z1231 Encounter for screening mammogram for malignant neoplasm of breast: Secondary | ICD-10-CM

## 2024-05-05 NOTE — Telephone Encounter (Signed)
 Spoke with patient regarding scheduling her mammogram/ patient states she will call and make her appointment -breast center phone number was given to the patient --[phone].

## 2024-05-13 ENCOUNTER — Telehealth: Payer: Self-pay | Admitting: *Deleted

## 2024-05-13 NOTE — Telephone Encounter (Signed)
 Spoke with patient regarding her mammogram appointment / patient states she is going to get that scheduled, that she has not forgot to do so.

## 2024-06-04 ENCOUNTER — Other Ambulatory Visit: Payer: Self-pay | Admitting: Student

## 2024-06-04 DIAGNOSIS — R1013 Epigastric pain: Secondary | ICD-10-CM

## 2024-06-04 NOTE — Telephone Encounter (Unsigned)
 Copied from CRM [id number]. Topic: Clinical - Medication Refill >> Jun 04, 2024  2:15 PM Fredrica W wrote: Medication:  pantoprazole  (PROTONIX ) 40 MG tablet  Pharmacy called to request   Has the patient contacted their pharmacy? Yes (Agent: If no, request that the patient contact the pharmacy for the refill. If patient does not wish to contact the pharmacy document the reason why and proceed with request.) (Agent: If yes, when and what did the pharmacy advise?) Pharmacy doing pill packs - has previously requested and did not receive. Please reach out to pharmacy with questions or concerns.   This is the patient's preferred pharmacy:  [facility] - [place], [place] - [address] [place] [address] Phone: [phone] Fax: [phone]   Is this the correct pharmacy for this prescription? Yes If no, delete pharmacy and type the correct one.   Has the prescription been filled recently? No  Is the patient out of the medication? No will be Friday   Has the patient been seen for an appointment in the last year OR does the patient have an upcoming appointment? No  Can we respond through MyChart? No  Agent: Please be advised that Rx refills may take up to 3 business days. We ask that you follow-up with your pharmacy.

## 2024-06-05 MED ORDER — PANTOPRAZOLE SODIUM 40 MG PO TBEC
40.0000 mg | DELAYED_RELEASE_TABLET | Freq: Two times a day (BID) | ORAL | 0 refills | Status: DC
Start: 2024-06-05 — End: 2024-07-03

## 2024-06-09 ENCOUNTER — Ambulatory Visit: Payer: Self-pay | Admitting: Student

## 2024-06-09 NOTE — Telephone Encounter (Signed)
 Patient is scheduled to see Dr.Amilibia on 10/30.

## 2024-06-09 NOTE — Telephone Encounter (Signed)
 FYI Only or Action Required?: Action required by provider: request for appointment.  Patient was last seen in primary care on 06/01/2023 by Gabino Boga, MD.  Called Nurse Triage reporting Mouth symptoms.  Symptoms began several days ago.  Interventions attempted: Nothing.  Symptoms are: unchanged.  Triage Disposition: See PCP When Office is Open (Within 3 Days)  Patient/caregiver understands and will follow disposition?:       Copied from CRM [id number]. Topic: Clinical - Red Word Triage >> Jun 09, 2024  9:44 AM Mercer PEDLAR wrote: Red Word that prompted transfer to Nurse Triage: Patient is calling to schedule an appointment for painful throat. She stated that she is not able to get medication refill until she is seen and she would like a full physical. Answer Assessment - Initial Assessment Questions 1. SYMPTOM: What's the main symptom you're concerned about? (e.g., chapped lips, dry mouth, lump, sores)     Back of mouth hurts 2. ONSET: When did the    start?     yesterday 3. PAIN: Is there any pain? If Yes, ask: How bad is it? (Scale: 0-10; or none, mild, moderate, severe)     7 4. CAUSE: What do you think is causing the symptoms?     unsure 5. OTHER SYMPTOMS: Do you have any other symptoms? (e.g., fever, sore throat, toothache, swelling)     no 6. PREGNANCY: Is there any chance you are pregnant? When was your last menstrual period?     no  Protocols used: Mouth Symptoms-A-AH

## 2024-06-12 ENCOUNTER — Ambulatory Visit: Payer: Self-pay

## 2024-06-16 ENCOUNTER — Encounter: Payer: Self-pay | Admitting: Radiology

## 2024-06-17 ENCOUNTER — Other Ambulatory Visit: Payer: Self-pay

## 2024-06-17 DIAGNOSIS — E669 Obesity, unspecified: Secondary | ICD-10-CM

## 2024-06-17 DIAGNOSIS — Z794 Long term (current) use of insulin: Secondary | ICD-10-CM

## 2024-06-18 MED ORDER — OZEMPIC (0.25 OR 0.5 MG/DOSE) 2 MG/3ML ~~LOC~~ SOPN: 0.2500 mg | PEN_INJECTOR | SUBCUTANEOUS | 11 refills | Status: AC

## 2024-06-19 ENCOUNTER — Ambulatory Visit: Payer: Self-pay | Admitting: Student

## 2024-06-25 ENCOUNTER — Ambulatory Visit: Payer: Self-pay | Admitting: Student

## 2024-06-30 ENCOUNTER — Ambulatory Visit: Payer: Self-pay | Admitting: Student

## 2024-07-02 ENCOUNTER — Other Ambulatory Visit: Payer: Self-pay | Admitting: Student

## 2024-07-02 DIAGNOSIS — I10 Essential (primary) hypertension: Secondary | ICD-10-CM

## 2024-07-02 DIAGNOSIS — E119 Type 2 diabetes mellitus without complications: Secondary | ICD-10-CM

## 2024-07-02 NOTE — Telephone Encounter (Signed)
 Pt has an appt scheduled 07/15/24. Pt stated she does not want the Dexcom G7 any longer; I told this can be discussed at her appt.

## 2024-07-02 NOTE — Telephone Encounter (Signed)
 Medication sent to pharmacy

## 2024-07-03 ENCOUNTER — Other Ambulatory Visit: Payer: Self-pay

## 2024-07-03 ENCOUNTER — Telehealth: Payer: Self-pay

## 2024-07-03 DIAGNOSIS — R1013 Epigastric pain: Secondary | ICD-10-CM

## 2024-07-03 MED ORDER — PANTOPRAZOLE SODIUM 40 MG PO TBEC: 40.0000 mg | DELAYED_RELEASE_TABLET | Freq: Two times a day (BID) | ORAL | 0 refills | Status: DC

## 2024-07-03 NOTE — Telephone Encounter (Signed)
 Prior Authorization for patient (Dexcom G7 Sensor) came through on cover my meds was submitted with last office notes and labs awaiting approval or denial.  [id number]

## 2024-07-03 NOTE — Telephone Encounter (Signed)
 Hannah Young ([name]) PA Case ID #: [id number] Rx #: [id number] Need Help? Call us  at [phone] Outcome Approved today by [facility] Medicare 2017 NCPDP Request Reference Number: [id number]. DEXCOM G7 MIS SENSOR is approved through 08/13/2025. Your patient may now fill this prescription and it will be covered. Effective Date: 07/03/2024 Authorization Expiration Date: 08/13/2025 Drug Dexcom G7 Sensor ePA cloud logo Form [employer] Medicare Part D Electronic Prior Authorization Form [phone] NCPDP) Original Claim Info 65

## 2024-07-03 NOTE — Telephone Encounter (Signed)
 Medication sent to pharmacy

## 2024-07-08 ENCOUNTER — Ambulatory Visit: Admitting: Obstetrics

## 2024-07-15 ENCOUNTER — Ambulatory Visit: Payer: Self-pay | Admitting: Student

## 2024-07-17 ENCOUNTER — Ambulatory Visit: Admitting: Obstetrics

## 2024-07-21 ENCOUNTER — Ambulatory Visit: Payer: Self-pay

## 2024-07-21 ENCOUNTER — Other Ambulatory Visit: Payer: Self-pay | Admitting: Student

## 2024-07-21 DIAGNOSIS — E119 Type 2 diabetes mellitus without complications: Secondary | ICD-10-CM

## 2024-07-21 NOTE — Telephone Encounter (Signed)
 FYI Only or Action Required?: FYI only for provider: appointment scheduled on 07/29/24.  Patient was last seen in primary care on 06/01/2023 by Gabino Boga, MD.  Called Nurse Triage reporting Appointment.  Symptoms began chronic.  Interventions attempted: Other: states she has followed up with specialists.  Symptoms are: unchanged.  Triage Disposition: See PCP Within 2 Weeks (overriding Information or Advice Only Call)  Patient/caregiver understands and will follow disposition?: Yes           Copied from CRM [id number]. Topic: Clinical - Red Word Triage >> Jul 21, 2024  1:11 PM Alfonso ORN wrote: Red Word that prompted transfer to Nurse Triage: patient have worsen back hip and knee pain rate 10 ,also experiencing sensation feel like head burning  all the times  Did mention vaginal irritation  (Patient originally called to get an appointment for medical clearance and check A1c pending to have surgery  waiting on clearance  )   ----------------------------------------------------------------------- From previous Reason for Contact - Pink Word Triage: Pink Word triggered transfer to Nurse Triage. See Triage Message for details.   ----------------------------------------------------------------------- From previous Reason for Contact - Scheduling: Patient/patient representative is calling to schedule an appointment. Refer to attachments for appointment information. >> Jul 21, 2024  1:46 PM Carrielelia G wrote: Patient stated she was not going to hold on and just wants a call back  >> Jul 21, 2024  1:44 PM Carrielelia G wrote: Patient calling NT back stated the call dropped.  Reason for Disposition  [1] Other NON-URGENT information for PCP AND [2] does not require PCP response  Answer Assessment - Initial Assessment Questions 1. REASON FOR CALL or QUESTION: What is your reason for calling today? or How can I best     She states she is calling due to chronic issues. No  new issues today. She states she was trying to get scheduled for an office visit for medical clearance as she has been following up with specialists for her complaints and they have redirected her to her PCP for clearance so that further testing can be ordered once medically cleared. She states she can't have any surgeries until she has her HA1C checked. Last office visit October 2024. Patient states she needs to have her left hip surgery as well as both knees, back procedure for head and neck pain (states she was told she needs to have an ablation something burned due to damage in the nerves), liver biopsy. She is also requesting a routine STD check and pain management referral. Patient states she can not be seen until 07/29/24 due to transportation. RN advised call back for any new or worsening symptoms. Strict ED precautions given to call back for chest pain, SOB or stroke like symptoms. Patient verbalizes understanding.  2. CALLER: Document the source of call. (e.g., laboratory staff, caregiver or patient).     Patient.  Protocols used: PCP Call - No Triage-A-AH

## 2024-07-29 ENCOUNTER — Other Ambulatory Visit: Payer: Self-pay | Admitting: Student

## 2024-07-29 ENCOUNTER — Ambulatory Visit: Payer: Self-pay

## 2024-07-29 DIAGNOSIS — R1013 Epigastric pain: Secondary | ICD-10-CM

## 2024-08-06 ENCOUNTER — Ambulatory Visit: Admitting: Student

## 2024-08-12 ENCOUNTER — Ambulatory Visit: Admitting: Student

## 2024-08-17 ENCOUNTER — Encounter (HOSPITAL_COMMUNITY): Payer: Self-pay | Admitting: *Deleted

## 2024-08-17 ENCOUNTER — Ambulatory Visit (HOSPITAL_COMMUNITY): Payer: Self-pay | Admitting: Emergency Medicine

## 2024-08-17 ENCOUNTER — Ambulatory Visit (HOSPITAL_COMMUNITY)
Admission: EM | Admit: 2024-08-17 | Discharge: 2024-08-17 | Disposition: A | Discharge status: Home / Self Care | Attending: Emergency Medicine | Admitting: Emergency Medicine

## 2024-08-17 ENCOUNTER — Telehealth (HOSPITAL_COMMUNITY): Payer: Self-pay | Admitting: *Deleted

## 2024-08-17 DIAGNOSIS — Z794 Long term (current) use of insulin: Secondary | ICD-10-CM | POA: Diagnosis not present

## 2024-08-17 DIAGNOSIS — R0981 Nasal congestion: Secondary | ICD-10-CM | POA: Diagnosis not present

## 2024-08-17 DIAGNOSIS — R739 Hyperglycemia, unspecified: Secondary | ICD-10-CM | POA: Insufficient documentation

## 2024-08-17 DIAGNOSIS — J029 Acute pharyngitis, unspecified: Secondary | ICD-10-CM | POA: Diagnosis not present

## 2024-08-17 DIAGNOSIS — E1169 Type 2 diabetes mellitus with other specified complication: Secondary | ICD-10-CM | POA: Diagnosis not present

## 2024-08-17 DIAGNOSIS — N760 Acute vaginitis: Secondary | ICD-10-CM | POA: Diagnosis not present

## 2024-08-17 DIAGNOSIS — Z113 Encounter for screening for infections with a predominantly sexual mode of transmission: Secondary | ICD-10-CM | POA: Insufficient documentation

## 2024-08-17 LAB — COMPREHENSIVE METABOLIC PANEL WITH GFR
ALT: 134 U/L — ABNORMAL HIGH (ref 0–44)
AST: 98 U/L — ABNORMAL HIGH (ref 15–41)
Albumin: 4.2 g/dL (ref 3.5–5.0)
Alkaline Phosphatase: 157 U/L — ABNORMAL HIGH (ref 38–126)
Anion gap: 15 (ref 5–15)
BUN: 22 mg/dL — ABNORMAL HIGH (ref 6–20)
CO2: 24 mmol/L (ref 22–32)
Calcium: 9.8 mg/dL (ref 8.9–10.3)
Chloride: 92 mmol/L — ABNORMAL LOW (ref 98–111)
Creatinine, Ser: 0.87 mg/dL (ref 0.44–1.00)
GFR, Estimated: >60 mL/min
Glucose, Bld: 538 mg/dL (ref 70–99)
Potassium: 4.5 mmol/L (ref 3.5–5.1)
Sodium: 130 mmol/L — ABNORMAL LOW (ref 135–145)
Total Bilirubin: 0.7 mg/dL (ref 0.0–1.2)
Total Protein: 8.4 g/dL — ABNORMAL HIGH (ref 6.5–8.1)

## 2024-08-17 LAB — CBC WITH DIFFERENTIAL/PLATELET
Abs Immature Granulocytes: 0.14 K/uL — ABNORMAL HIGH (ref 0.00–0.07)
Basophils Absolute: 0 K/uL (ref 0.0–0.1)
Basophils Relative: 0 %
Eosinophils Absolute: 0 K/uL (ref 0.0–0.5)
Eosinophils Relative: 0 %
HCT: 42.3 % (ref 36.0–46.0)
Hemoglobin: 14.1 g/dL (ref 12.0–15.0)
Immature Granulocytes: 1 %
Lymphocytes Relative: 11 %
Lymphs Abs: 1.9 K/uL (ref 0.7–4.0)
MCH: 33.7 pg (ref 26.0–34.0)
MCHC: 33.3 g/dL (ref 30.0–36.0)
MCV: 101 fL — ABNORMAL HIGH (ref 80.0–100.0)
Monocytes Absolute: 1.2 K/uL — ABNORMAL HIGH (ref 0.1–1.0)
Monocytes Relative: 7 %
Neutro Abs: 13.5 K/uL — ABNORMAL HIGH (ref 1.7–7.7)
Neutrophils Relative %: 81 %
Platelets: 366 K/uL (ref 150–400)
RBC: 4.19 MIL/uL (ref 3.87–5.11)
RDW: 12.5 % (ref 11.5–15.5)
WBC: 16.7 K/uL — ABNORMAL HIGH (ref 4.0–10.5)
nRBC: 0 % (ref 0.0–0.2)

## 2024-08-17 LAB — POCT URINE DIPSTICK
Bilirubin, UA: NEGATIVE
Blood, UA: NEGATIVE
Glucose, UA: 500 mg/dL — AB
Leukocytes, UA: NEGATIVE
Nitrite, UA: NEGATIVE
Protein Ur, POC: NEGATIVE mg/dL
Spec Grav, UA: <=1.005 — AB
Urobilinogen, UA: 0.2 U/dL
pH, UA: 5.5

## 2024-08-17 LAB — GLUCOSE, POCT (MANUAL RESULT ENTRY): POCT Glucose (KUC): 515 mg/dL — AB (ref 70–99)

## 2024-08-17 LAB — HIV ANTIBODY (ROUTINE TESTING W REFLEX): HIV Screen 4th Generation wRfx: NONREACTIVE

## 2024-08-17 MED ORDER — FLUCONAZOLE 150 MG PO TABS: ORAL_TABLET | ORAL | 0 refills | Status: AC

## 2024-08-17 MED ORDER — LIDOCAINE VISCOUS HCL 2 % MT SOLN
OROMUCOSAL | Status: AC
  Filled 2024-08-17: qty 15

## 2024-08-17 MED ORDER — LIDOCAINE VISCOUS HCL 2 % MT SOLN
15.0000 mL | Freq: Once | OROMUCOSAL | Status: AC
  Administered 2024-08-17: 15 mL via OROMUCOSAL

## 2024-08-17 MED ORDER — NYSTATIN 100000 UNIT/ML MT SUSP: 5.0000 mL | Freq: Four times a day (QID) | OROMUCOSAL | 0 refills | Status: DC | PRN

## 2024-08-17 NOTE — ED Provider Notes (Signed)
 " [facility]    CSN: [id number] Arrival date & time: 08/17/24  1253      History   Chief Complaint Chief Complaint  Patient presents with   SEXUALLY TRANSMITTED DISEASE   Vaginal Itching   Sore Throat   Otalgia   Mouth Lesions    HPI Hannah Young is a 59 y.o. female.   Patient presents to clinic over multiple concerns.  She has been having pain in her mouth, sore throat, cough, congestion, rhinorrhea, vaginal discharge and ear pain.  She is not taking her insulin  in the past 3 days or so.  She does not currently have a Dexcom on.  Does have lancets at home but reports she cannot use her glucometer due to her phone incompatibility.  Currently has a flip phone.  She does have a primary care provider that she has not contacted over this issue.  Felt dizzy the other day at home, reports this is how she knew her blood sugar was high.  She has been coughing.  Is concerned that people may be trying to poison her at home because every time they bring her food she gets sick.  Is having thick white vaginal discharge with itching.  Would like HIV and syphilis screening in addition to STI screening.  Last A1c on file was 8.3, drawn a year ago.   The history is provided by the patient and medical records.  Vaginal Itching  Sore Throat  Otalgia Mouth Lesions   Past Medical History:  Diagnosis Date   Anxiety    Arthritis    Asthma    Autoimmune hepatitis (HCC)    Bipolar 1 disorder (HCC)    Breast lump    Depression    Diabetes mellitus    FH: chemotherapy    Gout    Hair loss 10/21/2020   Headache 02/01/2021   High cholesterol    Housing situation unstable 03/22/2021   Hypertension    Liver cirrhosis (HCC)    Lung nodules    Neuropathy    Panic disorder    STD exposure 02/23/2021   Vaginal discharge 01/25/2017   Vertigo     Patient Active Problem List   Diagnosis Date Noted   On prednisone  therapy 06/01/2023   Type 2 diabetes mellitus without  complication, with long-term current use of insulin  (HCC) 06/01/2023   Epigastric pain 06/01/2023   OSA (obstructive sleep apnea) 06/01/2023   Insomnia 01/03/2023   Morbid obesity (HCC) 01/02/2023   PMB (postmenopausal bleeding) 11/09/2022   Endometrial thickening on ultrasound 11/09/2022   HLD (hyperlipidemia) 08/26/2022   Primary osteoarthritis of left hip 02/02/2022   Primary osteoarthritis of left knee 02/02/2022   Mold exposure 09/06/2021   Alopecia 10/21/2020   Chronic right shoulder pain 06/02/2020   Bilateral occipital neuralgia 05/01/2020   Chronic neck pain 05/01/2020   Arthralgia 03/29/2020   Osteoarthritis 02/12/2020   Healthcare maintenance 04/29/2018   Multiple lung nodules on CT 01/25/2017   Moderate recurrent major depression (HCC) 01/25/2017   Autoimmune hepatitis (HCC) 01/30/2012   Type 2 diabetes mellitus with other specified complication (HCC) 01/30/2012   Asthma 01/30/2012   Hypertension 01/30/2012    Past Surgical History:  Procedure Laterality Date   Biopsy of liver     CESAREAN SECTION     LAPAROSCOPY     TUBAL LIGATION      OB History     Gravida  6   Para  4   Term  0  Preterm  4   AB  2   Living  4      SAB  2   IAB  0   Ectopic      Multiple  0   Live Births  3            Home Medications    Prior to Admission medications  Medication Sig Start Date End Date Taking? Authorizing Provider  fluconazole  (DIFLUCAN ) 150 MG tablet Take 1 tablet today and another in 72 hours if vaginal itching persist. 08/17/24  Yes Dreama, Jeanita Carneiro  N, FNP  fluticasone  (FLONASE ) 50 MCG/ACT nasal spray Place 2 sprays into both nostrils daily. 04/08/24  Yes Elnora Ip, MD  insulin  glargine (LANTUS  SOLOSTAR) 100 UNIT/ML Solostar Pen Inject 15 Units into the skin at bedtime. 01/02/23  Yes Lemon Raisin, MD  insulin  lispro (HUMALOG ) 100 UNIT/ML injection Inject 0.05 mLs (5 Units total) into the skin 3 (three) times daily with meals.  01/02/23  Yes Lemon Raisin, MD  lisinopril  (ZESTRIL ) 10 MG tablet TAKE ONE TABLET BY MOUTH EVERY DAY 07/02/24  Yes Gomez-Caraballo, Maria, MD  magic mouthwash (nystatin , lidocaine , diphenhydrAMINE ) suspension Take 5 mLs by mouth 4 (four) times daily as needed for up to 10 days for mouth pain. 08/17/24 08/27/24 Yes Shanen Norris  N, FNP  mercaptopurine  (PURINETHOL ) 50 MG tablet Take 50 mg by mouth daily. Give on an empty stomach 1 hour before or 2 hours after meals. Caution: Chemotherapy.   Yes [provider]  metFORMIN  (GLUCOPHAGE -XR) 500 MG 24 hr tablet Take 2 tablets (1,000 mg total) by mouth 2 (two) times daily with a meal. 10/11/23 10/05/24 Yes Elnora Ip, MD  pantoprazole  (PROTONIX ) 40 MG tablet Take 1 tablet (40 mg total) by mouth 2 (two) times daily. 07/29/24 07/29/25 Yes Elnora Ip, MD  rosuvastatin  (CRESTOR ) 20 MG tablet TAKE ONE TABLET BY MOUTH EVERY DAY 01/15/24  Yes Gomez-Caraballo, Maria, MD  Semaglutide ,0.25 or 0.5MG /DOS, (OZEMPIC , 0.25 OR 0.5 MG/DOSE,) 2 MG/3ML SOPN Inject 0.25 mg into the skin once a week. 06/18/24  Yes Gomez-Caraballo, Maria, MD  venlafaxine  XR (EFFEXOR -XR) 75 MG 24 hr capsule Take 1 capsule (75 mg total) by mouth daily with breakfast. 11/22/23 11/16/24 Yes Elnora Ip, MD  ACCU-CHEK GUIDE TEST test strip USE AS DIRECTED TO CHECK BLOOD SUGAR THREE TIMES DAILY 07/21/24   Elnora Ip, MD  Accu-Chek Softclix Lancets lancets USE AS DIRECTED TO CHECK BLOD SUGAR THREE TIMES DAILY 07/21/24   Elnora Ip, MD  albuterol  (VENTOLIN  HFA) 108 (90 Base) MCG/ACT inhaler INHALE ONE TO TWO puffs into THE lungs EVERY SIX HOURS AS NEEDED SHORTNESS OF BREATH 11/12/23   Elnora Ip, MD  azithromycin  (ZITHROMAX  Z-PAK) 250 MG tablet Take 2 tablets on day one and 1 tablet each of the following 4 days 01/05/24   Odell Balls, PA-C  blood glucose meter kit and supplies Dispense based on patient and insurance preference. Use up  to four times daily as directed. (FOR ICD-10 E10.9, E11.9). 06/01/23   Gabino Boga, MD  Blood Glucose Monitoring Suppl (ACCU-CHEK GUIDE ME) w/Device KIT Use 3 times per day to check your blood sugar. 08/25/22   Elnora Ip, MD  Continuous Glucose Receiver (DEXCOM G7 RECEIVER) DEVI Use with Dexcom G7 sensors to monitor your glucose continuously. 07/11/23   Elnora Ip, MD  Continuous Glucose Sensor (DEXCOM G7 SENSOR) MISC APPLY EVERY 10 DAYS AS DIRECTED 07/02/24   Elnora Ip, MD  Insulin  Pen Needle (SURE COMFORT PEN NEEDLES) 32G X 6 MM MISC USE  ONE needle IN THE MORNING, NOON, AND IN THE EVENING 07/23/23   Elnora Ip, MD  oxyCODONE  (OXY IR/ROXICODONE ) 5 MG immediate release tablet Take 1 tablet (5 mg total) by mouth every 6 (six) hours as needed for severe pain or breakthrough pain. 12/26/22   Brooks, Dana, DO  predniSONE  (DELTASONE ) 10 MG tablet Take 2 tablets (20 mg total) by mouth daily. 01/05/24   Odell Balls, PA-C    Family History Family History  Problem Relation Age of Onset   Liver disease Mother    Kidney failure Father    Heart disease Sister    Liver disease Daughter    Anxiety disorder Daughter    Heart disease Son    Colon cancer Neg Hx    Stomach cancer Neg Hx    Migraines Neg Hx    Headache Neg Hx     Social History Social History[1]   Allergies   Amoxicillin , Augmentin  [amoxicillin -pot clavulanate], Tylenol  [acetaminophen ], and Latex   Review of Systems Review of Systems  Per HPI  Physical Exam Triage Vital Signs ED Triage Vitals  Encounter Vitals Group     BP 08/17/24 1348 (!) 120/91     Girls Systolic BP Percentile --      Girls Diastolic BP Percentile --      Boys Systolic BP Percentile --      Boys Diastolic BP Percentile --      Pulse Rate 08/17/24 1348 93     Resp 08/17/24 1348 18     Temp 08/17/24 1348 98.3 F (36.8 C)     Temp Source 08/17/24 1348 Oral     SpO2 08/17/24 1348 98 %     Weight  --      Height --      Head Circumference --      Peak Flow --      Pain Score 08/17/24 1341 9     Pain Loc --      Pain Education --      Exclude from Growth Chart --    No data found.  Updated Vital Signs BP (!) 120/91 (BP Location: Left Arm)   Pulse 93   Temp 98.3 F (36.8 C) (Oral)   Resp 18   LMP 03/14/2021   SpO2 98%   Visual Acuity Right Eye Distance:   Left Eye Distance:   Bilateral Distance:    Right Eye Near:   Left Eye Near:    Bilateral Near:     Physical Exam Vitals and nursing note reviewed.  Constitutional:      Appearance: Normal appearance.  HENT:     Head: Normocephalic and atraumatic.     Right Ear: External ear normal.     Left Ear: External ear normal.     Nose: Nose normal.     Mouth/Throat:     Mouth: Mucous membranes are moist.     Pharynx: Uvula midline. Posterior oropharyngeal erythema present.  Eyes:     Conjunctiva/sclera: Conjunctivae normal.  Cardiovascular:     Rate and Rhythm: Normal rate and regular rhythm.     Heart sounds: Normal heart sounds. No murmur heard. Pulmonary:     Effort: Pulmonary effort is normal. No respiratory distress.     Breath sounds: Normal breath sounds. No wheezing.  Skin:    General: Skin is warm and dry.  Neurological:     General: No focal deficit present.     Mental Status: She is alert.  Psychiatric:  Mood and Affect: Mood normal.        Behavior: Behavior is cooperative.      UC Treatments / Results  Labs (all labs ordered are listed, but only abnormal results are displayed) Labs Reviewed  POCT URINE DIPSTICK - Abnormal; Notable for the following components:      Result Value   Color, UA light yellow (*)    Glucose, UA =500 (*)    Ketones, POC UA trace (5) (*)    Spec Grav, UA <=1.005 (*)    All other components within normal limits  GLUCOSE, POCT (MANUAL RESULT ENTRY) - Abnormal; Notable for the following components:   POCT Glucose ([facility]) 515 (*)    All other components  within normal limits  COMPREHENSIVE METABOLIC PANEL WITH GFR  CBC WITH DIFFERENTIAL/PLATELET  HIV ANTIBODY (ROUTINE TESTING W REFLEX)  SYPHILIS: RPR W/REFLEX TO RPR TITER AND TREPONEMAL ANTIBODIES, TRADITIONAL SCREENING AND DIAGNOSIS ALGORITHM  CERVICOVAGINAL ANCILLARY ONLY    EKG   Radiology No results found.  Procedures Procedures (including critical care time)  Medications Ordered in UC Medications  lidocaine  (XYLOCAINE ) 2 % viscous mouth solution 15 mL (15 mLs Mouth/Throat Given 08/17/24 1426)    Initial Impression / Assessment and Plan / UC Course  I have reviewed the triage vital signs and the nursing notes.  Pertinent labs & imaging results that were available during my care of the patient were reviewed by me and considered in my medical decision making (see chart for details).  Vitals and triage reviewed, patient is hemodynamically stable.  Lungs vesicular, heart with regular rate and rhythm.  Some congestion and rhinorrhea, symptomatic management discussed.  CBG in clinic 515.  Nonfasting.  Does have access to insulin  at home.  UA with trace ketones.  Will check basic labs including anion gap.  Will contact if emergent follow-up is needed based on these results.  Bilateral tympanic membranes are pearly gray.  No evidence of otitis media or externa.  Will treat empirically for yeast vaginitis with vaginal itching in the setting of poorly controlled type 2 diabetes.  STI screening obtained as well.  Emergency precautions given if symptoms evolve or worsen.  Patient verbalized understanding, no questions at this time.     Final Clinical Impressions(s) / UC Diagnoses   Final diagnoses:  Type 2 diabetes mellitus with other specified complication, with long-term current use of insulin  (HCC)  Acute pharyngitis, unspecified etiology  Hyperglycemia  Acute vaginitis  Encounter for screening examination for sexually transmitted infection  Nasal congestion      Discharge Instructions      I am concerned over how high your blood sugar is and that you are not taking your insulin  for your type 2 diabetes.  Please go home and take the insulin  that you have at home.  Contact your primary care provider tomorrow to get a proper glucometer.  I will call you in the next few hours if you need emergent follow-up at the nearest emergency department based on your labs.  You can use the Magic mouthwash to help with any mouth pain.  Take the Diflucan  to help with vaginal itching and you can take another in 72 hours if the vaginal itching is not fully resolved.  Sexually-transmitted infection screening along with screening for bacterial vaginosis and yeast vaginitis has been completed today and staff will contact you if further care is needed.  These results will be back over the next few days.  Seek immediate care at the nearest  emergency department if you develop syncope, loss of consciousness, altered mental status or blood sugar elevated over 600.      ED Prescriptions     Medication Sig Dispense Auth. Provider   magic mouthwash (nystatin , lidocaine , diphenhydrAMINE ) suspension Take 5 mLs by mouth 4 (four) times daily as needed for up to 10 days for mouth pain. 180 mL Dreama, Rachael Ferrie  N, FNP   fluconazole  (DIFLUCAN ) 150 MG tablet Take 1 tablet today and another in 72 hours if vaginal itching persist. 2 tablet Dreama, Jai Bear  N, FNP      PDMP not reviewed this encounter.     [1]  Social History Tobacco Use   Smoking status: Former    Current packs/day: 0.00    Average packs/day: 0.3 packs/day    Types: Cigarettes    Quit date: 08/22/2020    Years since quitting: 3.9   Smokeless tobacco: Never   Tobacco comments:    2-3 cigs/day  Vaping Use   Vaping status: Never Used  Substance Use Topics   Alcohol use: Not Currently    Alcohol/week: 3.0 standard drinks of alcohol    Types: 3 Cans of beer per week   Drug use: No     Dreama Tarry SAILOR, FNP 08/17/24 1435  "

## 2024-08-17 NOTE — Discharge Instructions (Signed)
 I am concerned over how high your blood sugar is and that you are not taking your insulin  for your type 2 diabetes.  Please go home and take the insulin  that you have at home.  Contact your primary care provider tomorrow to get a proper glucometer.  I will call you in the next few hours if you need emergent follow-up at the nearest emergency department based on your labs.  You can use the Magic mouthwash to help with any mouth pain.  Take the Diflucan  to help with vaginal itching and you can take another in 72 hours if the vaginal itching is not fully resolved.  Sexually-transmitted infection screening along with screening for bacterial vaginosis and yeast vaginitis has been completed today and staff will contact you if further care is needed.  These results will be back over the next few days.  Seek immediate care at the nearest emergency department if you develop syncope, loss of consciousness, altered mental status or blood sugar elevated over 600.

## 2024-08-17 NOTE — ED Triage Notes (Signed)
 Pt requesting STI testing cyto and blood work. She states she is having some vaginal irritation.   Pt states she has bilateral ear pain when she washes then, she states that the roof of her mouth is raw like blisters but she doesn't know what is going on.   Pt states she has not taken insulin  in over a week and her meter isnt working

## 2024-08-17 NOTE — Telephone Encounter (Signed)
 TC from [facility] [phone] who reported critical Glucose of 538. Ion Gap 15. Values reported to KANDICE Nose NP. No new orders given.

## 2024-08-18 ENCOUNTER — Other Ambulatory Visit (HOSPITAL_COMMUNITY): Payer: Self-pay

## 2024-08-18 ENCOUNTER — Telehealth (HOSPITAL_COMMUNITY): Payer: Self-pay

## 2024-08-18 LAB — SYPHILIS: RPR W/REFLEX TO RPR TITER AND TREPONEMAL ANTIBODIES, TRADITIONAL SCREENING AND DIAGNOSIS ALGORITHM: RPR Ser Ql: NONREACTIVE

## 2024-08-18 MED ORDER — LIDOCAINE VISCOUS HCL 2 % MT SOLN: 15.0000 mL | Freq: Four times a day (QID) | OROMUCOSAL | 0 refills | Status: AC | PRN

## 2024-08-18 NOTE — Telephone Encounter (Signed)
 Patient contacted our clinic and informed clinical team that the Magic mouthwash that was sent to the pharmacy at her visit yesterday (08/17/2024) was not covered by her insurance.  After reviewing the chart, this medication was prescribed to manage mouth pain and she was also given oral Diflucan .  Since the diflucan  should cover for oral thrush we will try viscous lidocaine  alone in the hopes this would be covered by insurance.  Patient can swish and spit this every 6 hours as needed but should not eat or drink immediately after using this medication.  I also contacted the [facility] outpatient pharmacy and they indicated that this medication (compounded Magic mouthwash) should be between $20 and $30 so if she would prefer to get the originally prescribed medication sent to that pharmacy let me know and I will send in a new prescription.

## 2024-08-18 NOTE — Telephone Encounter (Signed)
 Pt notified of new medication sent to pharmacy on file. All questions answered.

## 2024-08-20 ENCOUNTER — Ambulatory Visit: Admitting: Student

## 2024-08-20 LAB — CERVICOVAGINAL ANCILLARY ONLY
Chlamydia: NEGATIVE
Comment: NEGATIVE
Comment: NEGATIVE
Comment: NEGATIVE
Comment: NEGATIVE
Comment: NEGATIVE
Comment: NORMAL
Neisseria Gonorrhea: NEGATIVE

## 2024-08-25 ENCOUNTER — Other Ambulatory Visit: Payer: Self-pay | Admitting: Student

## 2024-08-25 MED ORDER — INSULIN LISPRO 100 UNIT/ML IJ SOLN: 5.0000 [IU] | Freq: Three times a day (TID) | INTRAMUSCULAR | 0 refills | Status: AC

## 2024-08-25 NOTE — Telephone Encounter (Signed)
 Copied from CRM #[id number]. Topic: Clinical - Medication Refill >> Aug 25, 2024  3:25 PM Hannah Young wrote: Medication:  insulin  lispro insulin  lispro (HUMALOG ) 100 UNIT/ML injection   Has the patient contacted their pharmacy? Yes (Agent: If no, request that the patient contact the pharmacy for the refill. If patient does not wish to contact the pharmacy document the reason why and proceed with request.) (Agent: If yes, when and what did the pharmacy advise?)  This is the patient's preferred pharmacy:  [facility] - [place], [place] - [address] [place] [address] Phone: [phone] Fax: [phone]   Is this the correct pharmacy for this prescription? Yes If no, delete pharmacy and type the correct one.   Has the prescription been filled recently? No  Is the patient out of the medication? Yes  Has the patient been seen for an appointment in the last year OR does the patient have an upcoming appointment? Yes  Can we respond through MyChart? Yes  Agent: Please be advised that Rx refills may take up to 3 business days. We ask that you follow-up with your pharmacy.

## 2024-08-28 ENCOUNTER — Inpatient Hospital Stay: Payer: Self-pay | Admitting: Student

## 2024-08-28 ENCOUNTER — Other Ambulatory Visit: Payer: Self-pay | Admitting: Student

## 2024-08-28 DIAGNOSIS — E119 Type 2 diabetes mellitus without complications: Secondary | ICD-10-CM

## 2024-08-28 DIAGNOSIS — R1013 Epigastric pain: Secondary | ICD-10-CM

## 2024-08-28 NOTE — Telephone Encounter (Signed)
 Medication sent to pharmacy

## 2024-09-03 ENCOUNTER — Inpatient Hospital Stay: Admitting: Student

## 2024-09-05 ENCOUNTER — Inpatient Hospital Stay: Admitting: Student

## 2024-09-09 ENCOUNTER — Ambulatory Visit: Payer: Self-pay

## 2024-09-09 NOTE — Telephone Encounter (Signed)
 Patient disconnected call prior to warm transfer. Nurse will attempt to call patient back.

## 2024-09-09 NOTE — Telephone Encounter (Signed)
 FYI Only or Action Required?: Action required by provider: request for appointment and clinical question for provider.  Patient was last seen in primary care on 06/01/2023 by Gabino Boga, MD.  Called Nurse Triage reporting Dizziness.  Symptoms began today.  Interventions attempted: Nothing.  Symptoms are: unchanged.  Triage Disposition: See Physician Within 24 Hours  Patient/caregiver understands and will follow disposition?: No, wishes to speak with PCP      Message from Susanna ORN sent at 09/09/2024 10:56 AM EST  Reason for Triage: Patient wanting to be seen in February. States she's been having a lot of headaches & dizziness. States she has migraines too.    Requesting physical appt Reason for Disposition  Taking a medicine that could cause dizziness (e.g., blood pressure medications, diuretics)  Answer Assessment - Initial Assessment Questions Offered appt today, patient declines. Patient reports have to arrange transportation. Patient requesting physical appt for Feb 9th.  Patient reports does not have glucometer or supplies, no access to internet/smart phone; cannot visualize Dexacon device reading.  Patient does not check BP; no bp cuff.  Advised 911 if symptoms occur/worsen: severe diff breathing, chest pain > 5 min, faint. Severe HA, changes vision, confusion, diff speech/ walking, numbness/weakness one side of body. Patient verbalized understanding.   1. DESCRIPTION: Describe your dizziness.     When waking up; today had low blood sugar episode, ate food, feels better. 2. LIGHTHEADED: Do you feel lightheaded? (e.g., somewhat faint, woozy, weak upon standing)     not 3. VERTIGO: Do you feel like either you or the room is spinning or tilting? (i.e., vertigo)     denies 4. SEVERITY: How bad is it?  Do you feel like you are going to faint? Can you stand and walk?     Yes able to stand and walk 5. ONSET:  When did the dizziness begin?      years 6. AGGRAVATING FACTORS: Does anything make it worse? (e.g., standing, change in head position)     Standing up too fast  8. CAUSE: What do you think is causing the dizziness? (e.g., decreased fluids or food, diarrhea, emotional distress, heat exposure, new medicine, sudden standing, vomiting; unknown)     no 10. OTHER SYMPTOMS: Do you have any other symptoms? (e.g., fever, chest pain, vomiting, diarrhea, bleeding) Denies diff breathing, chest pain, faint, weakness/numbness one side of body, HA, dizziness, confusion, diff standing/ speech, fever chills n/v  Protocols used: Dizziness - Lightheadedness-A-AH

## 2024-09-09 NOTE — Telephone Encounter (Signed)
Return pt's call; no answer - left message to call the office. 

## 2024-09-22 ENCOUNTER — Ambulatory Visit: Payer: Self-pay | Admitting: Student

## 2024-09-30 ENCOUNTER — Ambulatory Visit: Admitting: Student
